# Patient Record
Sex: Male | Born: 1960 | Race: Black or African American | Hispanic: No | Marital: Single | Smoking: Current every day smoker
Health system: Southern US, Community
[De-identification: ages and names within clinical notes are randomized; demographics above are authoritative.]

## PROBLEM LIST (undated history)

## (undated) DIAGNOSIS — I5042 Chronic combined systolic (congestive) and diastolic (congestive) heart failure: Secondary | ICD-10-CM

## (undated) DIAGNOSIS — I472 Ventricular tachycardia: Secondary | ICD-10-CM

## (undated) DIAGNOSIS — I5041 Acute combined systolic (congestive) and diastolic (congestive) heart failure: Secondary | ICD-10-CM

## (undated) DIAGNOSIS — I2510 Atherosclerotic heart disease of native coronary artery without angina pectoris: Secondary | ICD-10-CM

## (undated) DIAGNOSIS — I4729 Other ventricular tachycardia: Secondary | ICD-10-CM

## (undated) DIAGNOSIS — I420 Dilated cardiomyopathy: Secondary | ICD-10-CM

## (undated) DIAGNOSIS — E785 Hyperlipidemia, unspecified: Secondary | ICD-10-CM

## (undated) DIAGNOSIS — J45909 Unspecified asthma, uncomplicated: Secondary | ICD-10-CM

## (undated) HISTORY — DX: Ventricular tachycardia: I47.2

## (undated) HISTORY — DX: Chronic combined systolic (congestive) and diastolic (congestive) heart failure: I50.42

## (undated) HISTORY — DX: Other ventricular tachycardia: I47.29

## (undated) HISTORY — DX: Hyperlipidemia, unspecified: E78.5

## (undated) HISTORY — DX: Acute combined systolic (congestive) and diastolic (congestive) heart failure: I50.41

## (undated) HISTORY — DX: Atherosclerotic heart disease of native coronary artery without angina pectoris: I25.10

## (undated) HISTORY — DX: Dilated cardiomyopathy: I42.0

---

## 2002-08-31 ENCOUNTER — Emergency Department (HOSPITAL_COMMUNITY): Admission: EM | Admit: 2002-08-31 | Discharge: 2002-08-31 | Payer: Self-pay | Admitting: Emergency Medicine

## 2004-04-10 ENCOUNTER — Emergency Department (HOSPITAL_COMMUNITY): Admission: EM | Admit: 2004-04-10 | Discharge: 2004-04-10 | Payer: Self-pay | Admitting: Family Medicine

## 2005-01-13 ENCOUNTER — Emergency Department (HOSPITAL_COMMUNITY): Admission: EM | Admit: 2005-01-13 | Discharge: 2005-01-13 | Payer: Self-pay | Admitting: *Deleted

## 2005-01-15 ENCOUNTER — Emergency Department (HOSPITAL_COMMUNITY): Admission: EM | Admit: 2005-01-15 | Discharge: 2005-01-15 | Payer: Self-pay | Admitting: Emergency Medicine

## 2005-01-22 ENCOUNTER — Ambulatory Visit: Payer: Self-pay | Admitting: Family Medicine

## 2005-07-16 ENCOUNTER — Emergency Department (HOSPITAL_COMMUNITY): Admission: EM | Admit: 2005-07-16 | Discharge: 2005-07-16 | Payer: Self-pay | Admitting: Emergency Medicine

## 2007-01-19 ENCOUNTER — Emergency Department (HOSPITAL_COMMUNITY): Admission: AC | Admit: 2007-01-19 | Discharge: 2007-01-19 | Payer: Self-pay

## 2011-07-23 ENCOUNTER — Emergency Department (HOSPITAL_COMMUNITY)
Admission: EM | Admit: 2011-07-23 | Discharge: 2011-07-23 | Disposition: A | Discharge status: Home / Self Care | Payer: No Typology Code available for payment source | Attending: Emergency Medicine | Admitting: Emergency Medicine

## 2011-07-23 ENCOUNTER — Emergency Department (HOSPITAL_COMMUNITY): Payer: No Typology Code available for payment source

## 2011-07-23 DIAGNOSIS — S139XXA Sprain of joints and ligaments of unspecified parts of neck, initial encounter: Secondary | ICD-10-CM | POA: Insufficient documentation

## 2011-07-23 DIAGNOSIS — Y92410 Unspecified street and highway as the place of occurrence of the external cause: Secondary | ICD-10-CM | POA: Insufficient documentation

## 2011-07-23 DIAGNOSIS — S335XXA Sprain of ligaments of lumbar spine, initial encounter: Secondary | ICD-10-CM | POA: Insufficient documentation

## 2011-07-23 DIAGNOSIS — M542 Cervicalgia: Secondary | ICD-10-CM | POA: Insufficient documentation

## 2011-07-23 DIAGNOSIS — S39012A Strain of muscle, fascia and tendon of lower back, initial encounter: Secondary | ICD-10-CM

## 2011-07-23 DIAGNOSIS — M549 Dorsalgia, unspecified: Secondary | ICD-10-CM | POA: Insufficient documentation

## 2011-07-23 DIAGNOSIS — R209 Unspecified disturbances of skin sensation: Secondary | ICD-10-CM | POA: Insufficient documentation

## 2011-07-23 DIAGNOSIS — S161XXA Strain of muscle, fascia and tendon at neck level, initial encounter: Secondary | ICD-10-CM

## 2011-07-23 MED ORDER — HYDROCODONE-ACETAMINOPHEN 5-325 MG PO TABS: 1.0000 | ORAL_TABLET | Freq: Four times a day (QID) | ORAL | Status: AC | PRN

## 2011-07-23 MED ORDER — CYCLOBENZAPRINE HCL 10 MG PO TABS: 10.0000 mg | ORAL_TABLET | Freq: Three times a day (TID) | ORAL | Status: AC | PRN

## 2011-07-23 MED ORDER — IBUPROFEN 800 MG PO TABS: 800.0000 mg | ORAL_TABLET | Freq: Three times a day (TID) | ORAL | Status: AC | PRN
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
## 2011-07-23 NOTE — ED Provider Notes (Signed)
Evaluation and management procedures were performed by the mid-level provider (PA/NP/CNM) under my supervision/collaboration. I was present and available during the ED course. Matthew Cina Y.   Adelaide Pfefferkorn Y. Jamielynn Wigley, MD 07/23/11 1607 
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
## 2012-01-16 ENCOUNTER — Encounter (HOSPITAL_COMMUNITY): Payer: Self-pay | Admitting: Emergency Medicine

## 2012-01-16 ENCOUNTER — Emergency Department (HOSPITAL_COMMUNITY): Payer: Self-pay

## 2012-01-16 ENCOUNTER — Emergency Department (HOSPITAL_COMMUNITY)
Admission: EM | Admit: 2012-01-16 | Discharge: 2012-01-17 | Disposition: A | Discharge status: Home / Self Care | Payer: Self-pay | Attending: Emergency Medicine | Admitting: Emergency Medicine

## 2012-01-16 DIAGNOSIS — R51 Headache: Secondary | ICD-10-CM | POA: Insufficient documentation

## 2012-01-16 DIAGNOSIS — H5710 Ocular pain, unspecified eye: Secondary | ICD-10-CM | POA: Insufficient documentation

## 2012-01-16 DIAGNOSIS — R209 Unspecified disturbances of skin sensation: Secondary | ICD-10-CM | POA: Insufficient documentation

## 2012-01-16 DIAGNOSIS — S0083XA Contusion of other part of head, initial encounter: Secondary | ICD-10-CM

## 2012-01-16 DIAGNOSIS — S022XXA Fracture of nasal bones, initial encounter for closed fracture: Secondary | ICD-10-CM | POA: Insufficient documentation

## 2012-01-16 DIAGNOSIS — S0003XA Contusion of scalp, initial encounter: Secondary | ICD-10-CM | POA: Insufficient documentation

## 2012-01-16 DIAGNOSIS — M546 Pain in thoracic spine: Secondary | ICD-10-CM | POA: Insufficient documentation

## 2012-01-16 DIAGNOSIS — S1093XA Contusion of unspecified part of neck, initial encounter: Secondary | ICD-10-CM | POA: Insufficient documentation
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
## 2012-01-17 MED ORDER — DIAZEPAM 5 MG PO TABS
5.0000 mg | ORAL_TABLET | Freq: Four times a day (QID) | ORAL | Status: AC | PRN
Start: 2012-01-17 — End: 2012-01-27

## 2012-01-17 MED ORDER — HYDROMORPHONE HCL PF 1 MG/ML IJ SOLN
1.0000 mg | Freq: Once | INTRAMUSCULAR | Status: AC
  Administered 2012-01-17: 1 mg via INTRAMUSCULAR
  Filled 2012-01-17: qty 1

## 2012-01-17 MED ORDER — OXYCODONE-ACETAMINOPHEN 5-325 MG PO TABS: 1.0000 | ORAL_TABLET | Freq: Four times a day (QID) | ORAL | Status: AC | PRN
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
## 2014-01-16 ENCOUNTER — Emergency Department (HOSPITAL_COMMUNITY): Payer: Self-pay

## 2014-01-16 ENCOUNTER — Encounter (HOSPITAL_COMMUNITY): Payer: Self-pay | Admitting: Emergency Medicine

## 2014-01-16 ENCOUNTER — Emergency Department (HOSPITAL_COMMUNITY)
Admission: EM | Admit: 2014-01-16 | Discharge: 2014-01-16 | Disposition: A | Discharge status: Home / Self Care | Payer: Self-pay | Attending: Emergency Medicine | Admitting: Emergency Medicine

## 2014-01-16 DIAGNOSIS — R209 Unspecified disturbances of skin sensation: Secondary | ICD-10-CM | POA: Insufficient documentation

## 2014-01-16 DIAGNOSIS — R42 Dizziness and giddiness: Secondary | ICD-10-CM | POA: Insufficient documentation

## 2014-01-16 DIAGNOSIS — I10 Essential (primary) hypertension: Secondary | ICD-10-CM | POA: Insufficient documentation

## 2014-01-16 DIAGNOSIS — Z880 Allergy status to penicillin: Secondary | ICD-10-CM | POA: Insufficient documentation

## 2014-01-16 DIAGNOSIS — F17200 Nicotine dependence, unspecified, uncomplicated: Secondary | ICD-10-CM | POA: Insufficient documentation

## 2014-01-16 LAB — CBC
HCT: 43.3 % (ref 39.0–52.0)
Hemoglobin: 14.2 g/dL (ref 13.0–17.0)
MCH: 27.8 pg (ref 26.0–34.0)
MCHC: 32.8 g/dL (ref 30.0–36.0)
MCV: 84.9 fL (ref 78.0–100.0)
Platelets: 387 10*3/uL (ref 150–400)
RBC: 5.1 MIL/uL (ref 4.22–5.81)
RDW: 13.2 % (ref 11.5–15.5)
WBC: 7.3 10*3/uL (ref 4.0–10.5)

## 2014-01-16 LAB — DIFFERENTIAL
Basophils Absolute: 0 10*3/uL (ref 0.0–0.1)
Basophils Relative: 0 % (ref 0–1)
Eosinophils Absolute: 0.1 10*3/uL (ref 0.0–0.7)
Eosinophils Relative: 2 % (ref 0–5)
Lymphocytes Relative: 46 % (ref 12–46)
Lymphs Abs: 3.3 10*3/uL (ref 0.7–4.0)
Monocytes Absolute: 0.5 10*3/uL (ref 0.1–1.0)
Monocytes Relative: 7 % (ref 3–12)
Neutro Abs: 3.3 10*3/uL (ref 1.7–7.7)
Neutrophils Relative %: 45 % (ref 43–77)

## 2014-01-16 LAB — COMPREHENSIVE METABOLIC PANEL
ALT: 25 U/L (ref 0–53)
AST: 23 U/L (ref 0–37)
Albumin: 4.3 g/dL (ref 3.5–5.2)
Alkaline Phosphatase: 88 U/L (ref 39–117)
BUN: 15 mg/dL (ref 6–23)
CO2: 21 mEq/L (ref 19–32)
Calcium: 9.5 mg/dL (ref 8.4–10.5)
Chloride: 103 mEq/L (ref 96–112)
Creatinine, Ser: 1.01 mg/dL (ref 0.50–1.35)
GFR calc Af Amer: >90 mL/min (ref >90)
GFR calc non Af Amer: 84 mL/min — ABNORMAL LOW (ref >90)
Glucose, Bld: 118 mg/dL — ABNORMAL HIGH (ref 70–99)
Potassium: 4.3 mEq/L (ref 3.7–5.3)
Sodium: 138 mEq/L (ref 137–147)
Total Bilirubin: 0.3 mg/dL (ref 0.3–1.2)
Total Protein: 7.4 g/dL (ref 6.0–8.3)

## 2014-01-16 LAB — PROTIME-INR
INR: 0.88 (ref 0.00–1.49)
Prothrombin Time: 11.8 seconds (ref 11.6–15.2)

## 2014-01-16 MED ORDER — MECLIZINE HCL 50 MG PO TABS: 25.0000 mg | ORAL_TABLET | Freq: Three times a day (TID) | ORAL | Status: DC | PRN

## 2014-01-16 MED ORDER — HYDROCHLOROTHIAZIDE 25 MG PO TABS: 25.0000 mg | ORAL_TABLET | Freq: Every day | ORAL | Status: DC

## 2014-01-16 MED ORDER — MECLIZINE HCL 25 MG PO TABS
25.0000 mg | ORAL_TABLET | ORAL | Status: AC
  Administered 2014-01-16: 25 mg via ORAL
  Filled 2014-01-16: qty 1

## 2014-01-16 NOTE — ED Notes (Signed)
Patient that he is experiencing dizziness when is lying and then getting up. Patient also c/o tenderness of the left posterior neck and numbness and tingling down the left arm and left hand. Patient stated the symptoms started this AM. MAE.

## 2014-01-16 NOTE — Discharge Instructions (Signed)
Hypertension As your heart beats, it forces blood through your arteries. This force is your blood pressure. If the pressure is too high, it is called hypertension (HTN) or high blood pressure. HTN is dangerous because you may have it and not know it. High blood pressure may mean that your heart has to work harder to pump blood. Your arteries may be narrow or stiff. The extra work puts you at risk for heart disease, stroke, and other problems.  Blood pressure consists of two numbers, a higher number over a lower, 110/72, for example. It is stated as "110 over 72." The ideal is below 120 for the top number (systolic) and under 80 for the bottom (diastolic). Write down your blood pressure today. You should pay close attention to your blood pressure if you have certain conditions such as:  Heart failure.  Prior heart attack.  Diabetes  Chronic kidney disease.  Prior stroke.  Multiple risk factors for heart disease. To see if you have HTN, your blood pressure should be measured while you are seated with your arm held at the level of the heart. It should be measured at least twice. A one-time elevated blood pressure reading (especially in the Emergency Department) does not mean that you need treatment. There may be conditions in which the blood pressure is different between your right and left arms. It is important to see your caregiver soon for a recheck. Most people have essential hypertension which means that there is not a specific cause. This type of high blood pressure may be lowered by changing lifestyle factors such as:  Stress.  Smoking.  Lack of exercise.  Excessive weight.  Drug/tobacco/alcohol use.  Eating less salt. Most people do not have symptoms from high blood pressure until it has caused damage to the body. Effective treatment can often prevent, delay or reduce that damage. TREATMENT  When a cause has been identified, treatment for high blood pressure is directed at the  cause. There are a large number of medications to treat HTN. These fall into several categories, and your caregiver will help you select the medicines that are best for you. Medications may have side effects. You should review side effects with your caregiver. If your blood pressure stays high after you have made lifestyle changes or started on medicines,   Your medication(s) may need to be changed.  Other problems may need to be addressed.  Be certain you understand your prescriptions, and know how and when to take your medicine.  Be sure to follow up with your caregiver within the time frame advised (usually within two weeks) to have your blood pressure rechecked and to review your medications.  If you are taking more than one medicine to lower your blood pressure, make sure you know how and at what times they should be taken. Taking two medicines at the same time can result in blood pressure that is too low. SEEK IMMEDIATE MEDICAL CARE IF:  You develop a severe headache, blurred or changing vision, or confusion.  You have unusual weakness or numbness, or a faint feeling.  You have severe chest or abdominal pain, vomiting, or breathing problems. MAKE SURE YOU:   Understand these instructions.  Will watch your condition.  Will get help right away if you are not doing well or get worse. Document Released: 08/11/2005 Document Revised: 11/03/2011 Document Reviewed: 03/31/2008 [facility] Patient Information 2014 [place].  Vertigo Vertigo means you feel like you or your surroundings are moving when they are  not. Vertigo can be dangerous if it occurs when you are at work, driving, or performing difficult activities.  CAUSES  Vertigo occurs when there is a conflict of signals sent to your brain from the visual and sensory systems in your body. There are many different causes of vertigo, including:  Infections, especially in the inner ear.  A bad reaction to a drug or misuse of  alcohol and medicines.  Withdrawal from drugs or alcohol.  Rapidly changing positions, such as lying down or rolling over in bed.  A migraine headache.  Decreased blood flow to the brain.  Increased pressure in the brain from a head injury, infection, tumor, or bleeding. SYMPTOMS  You may feel as though the world is spinning around or you are falling to the ground. Because your balance is upset, vertigo can cause nausea and vomiting. You may have involuntary eye movements (nystagmus). DIAGNOSIS  Vertigo is usually diagnosed by physical exam. If the cause of your vertigo is unknown, your caregiver may perform imaging tests, such as an MRI scan (magnetic resonance imaging). TREATMENT  Most cases of vertigo resolve on their own, without treatment. Depending on the cause, your caregiver may prescribe certain medicines. If your vertigo is related to body position issues, your caregiver may recommend movements or procedures to correct the problem. In rare cases, if your vertigo is caused by certain inner ear problems, you may need surgery. HOME CARE INSTRUCTIONS   Follow your caregiver's instructions.  Avoid driving.  Avoid operating heavy machinery.  Avoid performing any tasks that would be dangerous to you or others during a vertigo episode.  Tell your caregiver if you notice that certain medicines seem to be causing your vertigo. Some of the medicines used to treat vertigo episodes can actually make them worse in some people. SEEK IMMEDIATE MEDICAL CARE IF:   Your medicines do not relieve your vertigo or are making it worse.  You develop problems with talking, walking, weakness, or using your arms, hands, or legs.  You develop severe headaches.  Your nausea or vomiting continues or gets worse.  You develop visual changes.  A family member notices behavioral changes.  Your condition gets worse. MAKE SURE YOU:  Understand these instructions.  Will watch your  condition.  Will get help right away if you are not doing well or get worse. Document Released: 05/21/2005 Document Revised: 11/03/2011 Document Reviewed: 02/27/2011 [facility] Patient Information 2014 [place], [place].   Emergency Department Resource Guide 1) Find a Doctor and Pay Out of Pocket Although you won't have to find out who is covered by your insurance plan, it is a good idea to ask around and get recommendations. You will then need to call the office and see if the doctor you have chosen will accept you as a new patient and what types of options they offer for patients who are self-pay. Some doctors offer discounts or will set up payment plans for their patients who do not have insurance, but you will need to ask so you aren't surprised when you get to your appointment.  2) Contact Your Local Health Department Not all health departments have doctors that can see patients for sick visits, but many do, so it is worth a call to see if yours does. If you don't know where your local health department is, you can check in your phone book. The CDC also has a tool to help you locate your state's health department, and many state websites also have listings of  all of their local health departments.  3) Find a Walk-in Clinic If your illness is not likely to be very severe or complicated, you may want to try a walk in clinic. These are popping up all over the country in pharmacies, drugstores, and shopping centers. They're usually staffed by nurse practitioners or physician assistants that have been trained to treat common illnesses and complaints. They're usually fairly quick and inexpensive. However, if you have serious medical issues or chronic medical problems, these are probably not your best option.  No Primary Care Doctor: - Call Health Connect at  [phone] - they can help you locate a primary care doctor that  accepts your insurance, provides certain services, etc. - Physician Referral  Service- [phone]  Chronic Pain Problems: Organization         Address  Phone   Notes  [facility]  [phone] Patients need to be referred by their primary care doctor.   Medication Assistance: Organization         Address  Phone   Notes  [facility] [address] [place], [place] [address] [phone] --Must be a resident of [place] [facility] -- Must have NO insurance coverage whatsoever (no Medicaid/ Medicare, etc.) -- The pt. MUST have a primary care doctor that directs their care regularly and follows them in the community   [facility]ist  [phone]   [employer]  [phone]    Agencies that provide inexpensive medical care: Organization         Address  Phone   Notes  [facility]  [phone]   [facility]    [phone]   [facility] [address] [place], [place] [address] [phone]   [facility] [address], [place] [phone]   Planned Parenthood    [phone]   [facility]    [phone]   [facility]  [address], [place] Phone:  [phone]) [phone], Fax:  [phone] Hours of Operation:  9 am - 6 pm, M-F.  Also accepts Medicaid/Medicare and self-pay.  [facility]  [address], [place] Phone: [phone], Fax: [phone]. Hours of Operation:  8:30 am - 5:30 pm, M-F.  Also accepts Medicaid and self-pay.  [facility] [place] [address], [place] Phone: [phone]) [phone]   [facility] [address], [place], [place] [phone], Ext. [phone] Mondays & Thursdays: 7-9 AM.  First 15 patients are seen on a first come, first serve basis.    Medicaid-accepting [facility] [place] Providers:  Organization         Address  Phone   Notes  [facility] [address], [place] [phone] Also accepts self-pay patients.  [facility] [address] [place], [place]  [phone]) [phone]   [facility] [address], [place] [phone]) [phone]   [facility] [address] [place] [address], [place] [phone]) [phone]   Renaye RakersVeita Bland [address], [place]   [phone] Only accepts [place] Access [place] patients after they have their name applied to their card.   Self-Pay (no insurance) in [facility]:  Retail buyerrganization         Address  Phone   Notes  Sickle Cell Patients, [facility] Internal Medicine [address] [place], [place] [phone]   [facility] [address] [place], [place] [phone]   [facility] [place]  [address], [place] [phone]   [facility]/Dr. Osei-Bonsu  [address] [place] or [address], [place] [phone] Phone number for both [place] and [place] locations is the same.  [facility] [address] [place] [phone]   [facility] [address] [place] or [address] [phone]   [facility] [address] [place] [phone], phone; [phone], fax Sees patients 1st and 3rd Saturday of every month.  Must not qualify for public or private insurance (i.e. Medicaid, Medicare, [place] Health Choice, Veterans' Benefits)  Household income should be no more than 200% of the poverty level The clinic cannot treat you if you are pregnant or think you are pregnant  Sexually transmitted diseases are not treated at the clinic.   Dental Care: Organization         Address  Phone  Notes  [facility] Department of [facility] [address] [place], [place] [phone] Accepts children up to age 32 who are enrolled in  [place] or [place] Health Choice; pregnant women with a Medicaid card; and children who have applied for Medicaid or [place] Health Choice, but were declined, whose parents can pay a reduced fee at time of service.  [facility] Department of [facility]  [address], [place] [phone] Accepts children up to age 57 who are enrolled in [place] or [place] Health Choice; pregnant women with a Medicaid card; and children who have applied for Medicaid or [place] Health Choice, but were declined, whose parents can pay a reduced fee at time of service.  [facility]  [address] [place], [place] [phone] Patients are seen by appointment only. Walk-ins are not accepted. [facility] will see patients 44 years of age and older. Monday - Tuesday (8am-5pm) Most Wednesdays (8:30-5pm) $30 per visit, cash only  [facility] Access PRO[facility]  [address], [facility] [phone] Patients are seen by appointment only. Walk-ins are not accepted. [facility] will see patients 66 years of age and older. One Wednesday Evening (Monthly: Volunteer Based).  $30 per visit, cash only  [employer]  [phone] for adults; Children under age 47, call Graduate Pediatric Dentistry at [phone]. Children aged 81-14, please call [phone] to request a pediatric application.  Dental services are provided in all areas of dental care including fillings, crowns and bridges, complete and partial dentures, implants, gum treatment, root canals, and extractions. Preventive care is also provided. Treatment is provided to both adults and children. Patients are selected via a lottery and there is often a waiting list.   [facility] [address], [place]  [phone] [email or web address]   [facility] [address] [place], [place] [phone], Ext. [phone] Second and Fourth Thursday of each month, opens at 6:30  AM; Clinic ends at 9 AM.  Patients are seen on a first-come first-served basis, and a limited number are seen during each clinic.   [facility]  [address] [place], [place] [phone]   Eligibility Requirements  You must have lived in [place], [place], or [place] counties for at least the last three months.   You cannot be eligible for state or federal sponsored [place], including [employer], [place], or [employer].   You generally cannot be eligible for healthcare insurance through your employer.    How to apply: Eligibility screenings are held every Tuesday and Wednesday afternoon from 1:00 pm until 4:00 pm. You do not need an appointment for the interview!  [facility] [address] [place], [place] [phone]   [facility] Health Department  [phone]   [facility] Health Department  [phone]   [facility] Health Department  [phone]    Behavioral Health Resources in the Community: Intensive Outpatient Programs Organization         Address  Phone  Notes  [facility] Services [address] [place], [place] [phone]   [facility] Outpatient [address], [place], [place] [phone]   [facility] [address], [place], [place]  [phone]   [facility] Mental Health [address]  [place], [place] [phone] or [phone]   Substance Abuse Resources Organization         Address  Phone  Notes  Alcohol [facility] Drug Services  [phone]   [facility]  [phone]   [facility] [place]  [phone]   Floydene Flock  [phone]   Residential & Outpatient Substance Abuse Program  [phone]   Psychological Services Organization         Address  Phone  Notes  [facility] Health  [phone]   [facility] Services  [phone]   [facility] [address] [place] [phone] or  [phone]    Mobile Crisis Teams Organization         Address  Phone  Notes  [facility], Mobile Crisis Care Unit  [phone]   [facility]  [address] [place], [place] [phone]   Doristine Locks [address] [place] [address] [place] [phone]    Self-Help/Support Groups Organization         Address  Phone             Notes  Mental Health Assoc. of  - variety of support groups  [phone]- I7437963 Call for more information  Narcotics Anonymous (NA), [facility] [address], [employer] [place]  2 meetings at this location   Statistician         Address  Phone  Notes  [facility] [address],    [place]  [phone]   [employer]  [address], [place] [address], [place], [place] [phone]   [facility] [address] [place], [place] [phone] Admissions: 8am-3pm M-F  [facility] [address],    [place], [place] [phone]   [facility] [address] [place], [place], [place] [phone]   [facility] [address]  [place], [place] [phone]   [facility] - Intensive Outpatient [address], [place], [place] [phone]   [facility] (Addiction Recovery [facility] [address]  [place], [place] [phone] or [phone]   [facility] (RTS) [address] [place], [place] [phone] Accepts Medicaid  [facility] [address],  [place] [phone] Substance Abuse/Addiction Treatment   [facility] Resources Organization         Address  Phone  Notes  [facility]  [phone]   Angie Fava, PhD [address] Ervin Knack [place], [place]   [phone] or [phone]   [facility] Behavioral   [address] [place], [place] [phone]   [facility] Recovery [address]  [place], [place] [phone] Insurance/Medicaid/sponsorship through [facility] [address]                                    [place], [place] [phone] Therapy/tele-psych/case  [facility] [address], [place] [phone]    Dr. Lolly Mustache  [phone]   [facility] [place] [place]  [employer] [facility] Dept. 1) [address], [place] 2) [address] [place] 3)  [address], [place] [phone]  [phone]   [facility] Child Abuse Hotline ([phone])374-0085 or [phone] (After Hours)

## 2014-01-16 NOTE — ED Provider Notes (Signed)
CSN: [id number]     Arrival date & time 01/16/14  4098 History   First MD Initiated Contact with Patient 01/16/14 [phone]     Chief Complaint  Patient presents with  . Dizziness    HPI Patient presents to the emergency room with complaints of dizziness. Patient states he noticed the symptoms last night. When he was lying down he felt lightheaded and then when he stood up he felt dizzy. His eyes felt weird but he was not specifically having trouble with double vision or blurred vision. He felt like his eyes were spinning around in his head. He also little discomfort in the back of his neck. He also has noticed a sensation of numbness in his left arm. His left arm still feels a little funny but the symptoms have mostly resolved. He talked to a friend who said he could be having a stroke he came to the emergency room to be checked out.  Denies trouble with slurred speech. He has not any trouble with his coordination. He does not feel weak. He has not been dropping any objects. History reviewed. No pertinent past medical history. History reviewed. No pertinent past surgical history. No family history on file. History  Substance Use Topics  . Smoking status: Current Every Day Smoker -- 0.50 packs/day  . Smokeless tobacco: Not on file  . Alcohol Use: No    Review of Systems  All other systems reviewed and are negative.     Allergies  Penicillins  Home Medications   Prior to Admission medications   Not on File   BP 155/96  Pulse 58  Temp(Src) 97.7 F (36.5 C) (Oral)  Resp 13  SpO2 97% Physical Exam  Nursing note and vitals reviewed. Constitutional: He is oriented to person, place, and time. He appears well-developed and well-nourished. No distress.  HENT:  Head: Normocephalic and atraumatic.  Right Ear: External ear normal.  Left Ear: External ear normal.  Mouth/Throat: Oropharynx is clear and moist.  Eyes: Conjunctivae are normal. Right eye exhibits no discharge. Left eye  exhibits no discharge. No scleral icterus.  Neck: Neck supple. No tracheal deviation present.  Cardiovascular: Normal rate, regular rhythm and intact distal pulses.   Pulmonary/Chest: Effort normal and breath sounds normal. No stridor. No respiratory distress. He has no wheezes. He has no rales.  Abdominal: Soft. Bowel sounds are normal. He exhibits no distension. There is no tenderness. There is no rebound and no guarding.  Musculoskeletal: He exhibits no edema and no tenderness.  Neurological: He is alert and oriented to person, place, and time. He has normal strength. No cranial nerve deficit (No facial droop, extraocular movements intact, tongue midline ) or sensory deficit. He exhibits normal muscle tone. He displays no seizure activity. Coordination normal.  No pronator drift bilateral upper extrem, able to hold both legs off bed for 5 seconds, sensation intact in all extremities, no visual field cuts, no left or right sided neglect, difficulty with finger to nose exam bilaterally requiring a lot of effort to touch the tip of my finger (requires more time to perform the test), no nystagmus noted   Skin: Skin is warm and dry. No rash noted.  Psychiatric: He has a normal mood and affect.    ED Course  Procedures (including critical care time) Labs Review Labs Reviewed  COMPREHENSIVE METABOLIC PANEL - Abnormal; Notable for the following:    Glucose, Bld 118 (*)    GFR calc non Af Amer 84 (*)  All other components within normal limits  PROTIME-INR  CBC  DIFFERENTIAL    Imaging Review Mr Brain Wo Contrast  01/16/2014   CLINICAL DATA:  Dizziness. Numbness and tingling in the left arm. Visual symptoms. Headache.  EXAM: MRI HEAD WITHOUT CONTRAST  TECHNIQUE: Multiplanar, multiecho pulse sequences of the brain and surrounding structures were obtained without intravenous contrast.  COMPARISON:  CT head 01/16/2012.  FINDINGS: The patient was unable to remain motionless for the exam. Small or  subtle lesions could be overlooked.  No evidence for acute infarction, hemorrhage, mass lesion, hydrocephalus, or extra-axial fluid. Normal cerebral volume. Mild increased signal in the subcortical and periventricular white matter, likely chronic microvascular ischemic change. No large vessel or lacunar infarct.  Flow voids are maintained throughout the carotid, basilar, and vertebral arteries. There are no areas of chronic hemorrhage. Pituitary, pineal, and cerebellar tonsils unremarkable. Visualized calvarium, skull base, and upper cervical osseous structures unremarkable. Scalp and extracranial soft tissues, orbits, sinuses, and mastoids show no acute process.  Compared with prior head CT, the appearance is similar.  IMPRESSION: Mild small vessel disease.  No acute intracranial abnormalities.   Electronically Signed   By: Davonna BellingJohn  Curnes M.D.   On: 01/16/2014 13:27     EKG Interpretation   Date/Time:  Monday Jan 16 2014 09:41:10 EDT Ventricular Rate:  76 PR Interval:  158 QRS Duration: 73 QT Interval:  384 QTC Calculation: 432 R Axis:   36 Text Interpretation:  Sinus rhythm Consider left atrial enlargement  Abnormal R-wave progression, early transition Consider left ventricular  hypertrophy Nonspecific T abnormalities, inferior leads No previous  tracing Confirmed by Aikam Hellickson  MD-J, Nathaniel Yaden ([id number]) on 01/16/2014 9:52:26 AM      MDM   Final diagnoses:  Dizziness  HTN (hypertension)    No stroke noted on the MRI.  Pt symptoms are somewhat vague.  ?peripheral vertigo, ?nerual impingement with the arm symptoms.  Discussed findings.  Will dc home on hctz and meclizine.  Discussed importance of PCP follow up    Linwood DibblesJon Deryk Bozman, MD 01/16/14 1418

## 2014-01-16 NOTE — ED Notes (Signed)
Patient transported to MRI 

## 2014-01-16 NOTE — ED Notes (Signed)
Pt c/o dizziness that started last night, states when he was laying down he couldn't sleep and when standing up his eyes felt weird. C/o pain in the back of neck,

## 2018-03-21 ENCOUNTER — Other Ambulatory Visit: Payer: Self-pay

## 2018-03-21 ENCOUNTER — Encounter (HOSPITAL_COMMUNITY): Payer: Self-pay

## 2018-03-21 ENCOUNTER — Emergency Department (HOSPITAL_COMMUNITY)
Admission: EM | Admit: 2018-03-21 | Discharge: 2018-03-21 | Disposition: A | Discharge status: Home / Self Care | Payer: Self-pay | Attending: Emergency Medicine | Admitting: Emergency Medicine

## 2018-03-21 DIAGNOSIS — F17210 Nicotine dependence, cigarettes, uncomplicated: Secondary | ICD-10-CM | POA: Insufficient documentation

## 2018-03-21 DIAGNOSIS — K047 Periapical abscess without sinus: Secondary | ICD-10-CM | POA: Insufficient documentation

## 2018-03-21 MED ORDER — NAPROXEN 500 MG PO TABS: 500.0000 mg | ORAL_TABLET | Freq: Two times a day (BID) | ORAL | 0 refills | Status: DC

## 2018-03-21 MED ORDER — IBUPROFEN 200 MG PO TABS
600.0000 mg | ORAL_TABLET | Freq: Once | ORAL | Status: AC
  Administered 2018-03-21: 600 mg via ORAL
  Filled 2018-03-21: qty 3

## 2018-03-21 MED ORDER — CLINDAMYCIN HCL 300 MG PO CAPS
300.0000 mg | ORAL_CAPSULE | Freq: Once | ORAL | Status: AC
  Administered 2018-03-21: 300 mg via ORAL
  Filled 2018-03-21: qty 1

## 2018-03-21 MED ORDER — CLINDAMYCIN HCL 300 MG PO CAPS: 300.0000 mg | ORAL_CAPSULE | Freq: Three times a day (TID) | ORAL | 0 refills | Status: DC

## 2018-03-21 NOTE — ED Triage Notes (Signed)
He c/o right-sided mouth discomfort. He has a missing tooth at right lower (second from very back), where the pain is centered.

## 2018-03-21 NOTE — Discharge Instructions (Addendum)
We are treating you for infection. Follow up with a dentist as soon as possible.

## 2018-03-21 NOTE — ED Provider Notes (Signed)
[facility]-EMERGENCY DEPT Provider Note   CSN: [id number] Arrival date & time: 03/21/18  1431     History   Chief Complaint Chief Complaint  Patient presents with  . Dental Pain    HPI Daryl [name] is a 57 y.o. male who presents to the ED for dental pain. The pain started 2 days ago in the right lower dental area. Patient reports he has been using a tea bag on the area without relief.   HPI  History reviewed. No pertinent past medical history.  There are no active problems to display for this patient.   No past surgical history on file.      Home Medications    Prior to Admission medications   Medication Sig Start Date End Date Taking? Authorizing Provider  clindamycin (CLEOCIN) 300 MG capsule Take 1 capsule (300 mg total) by mouth 3 (three) times daily. 03/21/18   Janne Napoleon, NP  hydrochlorothiazide (HYDRODIURIL) 25 MG tablet Take 1 tablet (25 mg total) by mouth daily. 01/16/14   Linwood Dibbles, MD  meclizine (ANTIVERT) 50 MG tablet Take 0.5 tablets (25 mg total) by mouth 3 (three) times daily as needed for dizziness or nausea. 01/16/14   Linwood Dibbles, MD  naproxen (NAPROSYN) 500 MG tablet Take 1 tablet (500 mg total) by mouth 2 (two) times daily. 03/21/18   Janne Napoleon, NP    Family History No family history on file.  Social History Social History   Tobacco Use  . Smoking status: Current Every Day Smoker    Packs/day: 0.50  . Smokeless tobacco: Never Used  Substance Use Topics  . Alcohol use: No  . Drug use: Not on file     Allergies   Penicillins   Review of Systems Review of Systems  HENT: Positive for dental problem.   All other systems reviewed and are negative.    Physical Exam Updated Vital Signs BP (!) 145/84 (BP Location: Right Arm)   Pulse 80   Temp 98.2 F (36.8 C) (Oral)   Resp 18   SpO2 100%   Physical Exam  Constitutional: He appears well-developed and well-nourished. No distress.  HENT:  Head:  Normocephalic.  Right Ear: Tympanic membrane normal.  Left Ear: Tympanic membrane normal.  Nose: Nose normal.  Mouth/Throat: Uvula is midline and oropharynx is clear and moist.    Right first molar missing. There is erythema and swelling of the gum surrounding that area as well as around the second molar.   Eyes: EOM are normal.  Neck: Neck supple.  Cardiovascular: Normal rate.  Pulmonary/Chest: Effort normal.  Musculoskeletal: Normal range of motion.  Neurological: He is alert.  Skin: Skin is warm and dry.  Psychiatric: He has a normal mood and affect.  Nursing note and vitals reviewed.    ED Treatments / Results  Labs (all labs ordered are listed, but only abnormal results are displayed) Labs Reviewed - No data to display  Radiology No results found.  Procedures Procedures (including critical care time)  Medications Ordered in ED Medications - No data to display   Initial Impression / Assessment and Plan / ED Course  I have reviewed the triage vital signs and the nursing notes. Patient with toothache.  No gross abscess.  Exam unconcerning for Ludwig's angina or spread of infection.  Will treat with Clindamycin since patient allergic to penicillin and with anti-inflammatories medicine.  Urged patient to follow-up with dentist. Resource referral given.   Final Clinical Impressions(s) /  ED Diagnoses   Final diagnoses:  Dental infection    ED Discharge Orders        Ordered    clindamycin (CLEOCIN) 300 MG capsule  3 times daily     03/21/18 1525    naproxen (NAPROSYN) 500 MG tablet  2 times daily     03/21/18 1525       Damian Leavell [place], [place] 03/21/18 1530    Sabas Sous, MD 03/21/18 [phone]

## 2018-03-21 NOTE — ED Notes (Signed)
Pt requesting initial doses of medication before leaving, NP Hope made aware

## 2018-04-18 ENCOUNTER — Emergency Department (HOSPITAL_COMMUNITY)
Admission: EM | Admit: 2018-04-18 | Discharge: 2018-04-18 | Disposition: A | Discharge status: Home / Self Care | Payer: Self-pay | Attending: Emergency Medicine | Admitting: Emergency Medicine

## 2018-04-18 ENCOUNTER — Encounter (HOSPITAL_COMMUNITY): Payer: Self-pay

## 2018-04-18 ENCOUNTER — Emergency Department (HOSPITAL_COMMUNITY): Payer: Self-pay

## 2018-04-18 ENCOUNTER — Other Ambulatory Visit: Payer: Self-pay

## 2018-04-18 DIAGNOSIS — J45909 Unspecified asthma, uncomplicated: Secondary | ICD-10-CM | POA: Insufficient documentation

## 2018-04-18 DIAGNOSIS — R220 Localized swelling, mass and lump, head: Secondary | ICD-10-CM

## 2018-04-18 DIAGNOSIS — R600 Localized edema: Secondary | ICD-10-CM | POA: Insufficient documentation

## 2018-04-18 DIAGNOSIS — F17200 Nicotine dependence, unspecified, uncomplicated: Secondary | ICD-10-CM | POA: Insufficient documentation

## 2018-04-18 DIAGNOSIS — Z79899 Other long term (current) drug therapy: Secondary | ICD-10-CM | POA: Insufficient documentation

## 2018-04-18 HISTORY — DX: Unspecified asthma, uncomplicated: J45.909

## 2018-04-18 LAB — COMPREHENSIVE METABOLIC PANEL
ALK PHOS: 92 U/L (ref 38–126)
ALT: 17 U/L (ref 0–44)
AST: 21 U/L (ref 15–41)
Albumin: 4.2 g/dL (ref 3.5–5.0)
Anion gap: 10 (ref 5–15)
BUN: 6 mg/dL (ref 6–20)
CALCIUM: 9.7 mg/dL (ref 8.9–10.3)
CO2: 28 mmol/L (ref 22–32)
CREATININE: 1.34 mg/dL — AB (ref 0.61–1.24)
Chloride: 100 mmol/L (ref 98–111)
GFR calc Af Amer: >60 mL/min (ref >60)
GFR calc non Af Amer: 58 mL/min — ABNORMAL LOW (ref >60)
GLUCOSE: 123 mg/dL — AB (ref 70–99)
Potassium: 4 mmol/L (ref 3.5–5.1)
Sodium: 138 mmol/L (ref 135–145)
Total Bilirubin: 0.9 mg/dL (ref 0.3–1.2)
Total Protein: 7.6 g/dL (ref 6.5–8.1)

## 2018-04-18 LAB — CBC WITH DIFFERENTIAL/PLATELET
Abs Immature Granulocytes: 0 10*3/uL (ref 0.0–0.1)
Basophils Absolute: 0 10*3/uL (ref 0.0–0.1)
Basophils Relative: 0 %
Eosinophils Absolute: 0.1 10*3/uL (ref 0.0–0.7)
Eosinophils Relative: 1 %
HCT: 51.9 % (ref 39.0–52.0)
Hemoglobin: 16.2 g/dL (ref 13.0–17.0)
Immature Granulocytes: 0 %
Lymphocytes Relative: 27 %
Lymphs Abs: 2.6 10*3/uL (ref 0.7–4.0)
MCH: 27.7 pg (ref 26.0–34.0)
MCHC: 31.2 g/dL (ref 30.0–36.0)
MCV: 88.9 fL (ref 78.0–100.0)
Monocytes Absolute: 0.9 10*3/uL (ref 0.1–1.0)
Monocytes Relative: 9 %
Neutro Abs: 6 10*3/uL (ref 1.7–7.7)
Neutrophils Relative %: 63 %
Platelets: 387 10*3/uL (ref 150–400)
RBC: 5.84 MIL/uL — ABNORMAL HIGH (ref 4.22–5.81)
RDW: 12.5 % (ref 11.5–15.5)
WBC: 9.6 10*3/uL (ref 4.0–10.5)

## 2018-04-18 MED ORDER — DEXAMETHASONE SODIUM PHOSPHATE 10 MG/ML IJ SOLN: 10.0000 mg | Freq: Once | INTRAMUSCULAR | Status: DC

## 2018-04-18 MED ORDER — DEXAMETHASONE SODIUM PHOSPHATE 10 MG/ML IJ SOLN
10.0000 mg | Freq: Once | INTRAMUSCULAR | Status: AC
  Administered 2018-04-18: 10 mg via INTRAVENOUS
  Filled 2018-04-18: qty 1

## 2018-04-18 MED ORDER — DOXYCYCLINE HYCLATE 100 MG PO CAPS: 100.0000 mg | ORAL_CAPSULE | Freq: Two times a day (BID) | ORAL | 0 refills | Status: AC

## 2018-04-18 MED ORDER — IOHEXOL 300 MG/ML  SOLN
75.0000 mL | Freq: Once | INTRAMUSCULAR | Status: AC | PRN
  Administered 2018-04-18: 75 mL via INTRAVENOUS

## 2018-04-18 NOTE — ED Triage Notes (Signed)
Pt seen in ED 03-21-18 for dental pain, was given Clindamycin.  Pt states 3-4 days of taking Clindamycin pt started having shortness of breath, pt cut down on the pills to one a day for several days, then opened capsule and poured in mouth, then stopped taking med.  Over the past 3-4 days right sided jaw pain and swelling to jaw, tongue, and throat.  Unable to eat solid foods, is able to drink fluids.

## 2018-04-18 NOTE — ED Notes (Signed)
Pt states that he would rather sit in chair beside bed rather than sit in the bed.   Refuses offered blanket

## 2018-04-18 NOTE — ED Provider Notes (Signed)
[facility] EMERGENCY DEPARTMENT Provider Note   CSN: [id number] Arrival date & time: 04/18/18  1308     History   Chief Complaint Chief Complaint  Patient presents with  . Facial Swelling    HPI Daryl [name] is a 57 y.o. male.  57 y.o male with a PMH of HTN non-compliant with medication presents to the ED with a chief complaint of facial swelling x 2 days. Patient states his first seen in the ED on 7/28 diagnosed with a tooth infection, he was prescribed clindamycin but he reports the swelling has gotten worse.  He reports he only completed 3 days of antibiotics as the symptoms worsen.  Patient reports difficulty swallowing solids, but is able to tolerate liquids.He states the pain is worse with swallowing  and laying down. He denies any fever, shortness of breath, changes in voice, or other complaints.      Past Medical History:  Diagnosis Date  . Asthma     There are no active problems to display for this patient.   History reviewed. No pertinent surgical history.      Home Medications    Prior to Admission medications   Medication Sig Start Date End Date Taking? Authorizing Provider  clindamycin (CLEOCIN) 300 MG capsule Take 1 capsule (300 mg total) by mouth 3 (three) times daily. 03/21/18   Janne Napoleon, NP  doxycycline (VIBRAMYCIN) 100 MG capsule Take 1 capsule (100 mg total) by mouth 2 (two) times daily for 7 days. 04/18/18 04/25/18  Claude Manges, PA-C  hydrochlorothiazide (HYDRODIURIL) 25 MG tablet Take 1 tablet (25 mg total) by mouth daily. 01/16/14   Linwood Dibbles, MD  meclizine (ANTIVERT) 50 MG tablet Take 0.5 tablets (25 mg total) by mouth 3 (three) times daily as needed for dizziness or nausea. 01/16/14   Linwood Dibbles, MD  naproxen (NAPROSYN) 500 MG tablet Take 1 tablet (500 mg total) by mouth 2 (two) times daily. 03/21/18   Janne Napoleon, NP    Family History History reviewed. No pertinent family history.  Social History Social History    Tobacco Use  . Smoking status: Current Every Day Smoker    Packs/day: 0.50  . Smokeless tobacco: Never Used  Substance Use Topics  . Alcohol use: Yes  . Drug use: Not on file     Allergies   Penicillins   Review of Systems Review of Systems  Constitutional: Negative for fever.  HENT: Positive for facial swelling and trouble swallowing. Negative for drooling, ear pain, rhinorrhea, sinus pressure, sinus pain, sore throat and voice change.   Eyes: Negative for pain.  Respiratory: Negative for shortness of breath.   Cardiovascular: Negative for chest pain.  All other systems reviewed and are negative.    Physical Exam Updated Vital Signs BP (!) 137/99 (BP Location: Right Arm)   Pulse 78   Temp 98.1 F (36.7 C) (Oral)   Resp 18   SpO2 97%   Physical Exam  Constitutional: He is oriented to person, place, and time. He appears well-developed and well-nourished.  HENT:  Head: Normocephalic and atraumatic.  Right Ear: Hearing normal.  Left Ear: Hearing normal.  Nose: Nose normal. Right sinus exhibits no maxillary sinus tenderness and no frontal sinus tenderness. Left sinus exhibits no maxillary sinus tenderness and no frontal sinus tenderness.  Mouth/Throat: Oropharynx is clear and moist. There is trismus in the jaw. Abnormal dentition. Dental abscesses present.  Unable to visualize oropharyngeal region as patient exhibits trismus on exam  and is unable to open mouth. Dental Abscess visualized on right back side.   Eyes: Pupils are equal, round, and reactive to light. No scleral icterus.  Neck: Normal range of motion and phonation normal. Muscular tenderness present. No neck rigidity. No tracheal deviation present.    Pain upon palpation of right side of his neck, patient continues to call back during exam and jump as he states the swelling feels like "throat closing"  Cardiovascular: Normal heart sounds.  Pulmonary/Chest: Effort normal and breath sounds normal. He has no  wheezes. He exhibits no tenderness.  Abdominal: Soft. Bowel sounds are normal. He exhibits no distension. There is no tenderness.  Musculoskeletal: He exhibits no tenderness or deformity.  Neurological: He is alert and oriented to person, place, and time.  Skin: Skin is warm and dry.  Nursing note and vitals reviewed.    ED Treatments / Results  Labs (all labs ordered are listed, but only abnormal results are displayed) Labs Reviewed  COMPREHENSIVE METABOLIC PANEL - Abnormal; Notable for the following components:      Result Value   Glucose, Bld 123 (*)    Creatinine, Ser 1.34 (*)    GFR calc non Af Amer 58 (*)    All other components within normal limits  CBC WITH DIFFERENTIAL/PLATELET - Abnormal; Notable for the following components:   RBC 5.84 (*)    All other components within normal limits    EKG None  Radiology Ct Soft Tissue Neck W Contrast  Result Date: 04/18/2018 CLINICAL DATA:  Sore throat/stridor, epiglottitis or tonsillitis suspected. Difficulty breathing. EXAM: CT NECK WITH CONTRAST TECHNIQUE: Multidetector CT imaging of the neck was performed using the standard protocol following the bolus administration of intravenous contrast. CONTRAST:  75mL OMNIPAQUE IOHEXOL 300 MG/ML  SOLN COMPARISON:  MRI head 01/16/2014.  Face CT 01/17/2012. FINDINGS: Pharynx and larynx: Palatine and lingual tonsils are enlarged and somewhat heterogeneous. No discrete abscess or drainable fluid collection is present. Asymmetric soft tissue enhancement extends into the right vallecula. There is no mass lesion. Epiglottis is within normal limits. Vocal cords are midline and symmetric. Trachea is unremarkable. The nasopharynx is clear. Salivary glands: The submandibular and parotid glands are within normal limits bilaterally. Thyroid: Normal Lymph nodes: Subcentimeter submental lymph nodes are present. A right submandibular lymph node measures 12 x 10 mm. There is mild prominence of right level 2 lymph  nodes as well. No pathologic enlargement is present. Vascular: Atherosclerotic calcifications are present in the proximal internal carotid arteries bilaterally without a significant stenosis relative to the more distal vessels. Limited intracranial: Negative. Visualized orbits: The globes and orbits are within normal limits bilaterally. Mastoids and visualized paranasal sinuses: The paranasal sinuses and mastoid air cells are clear. Skeleton: Endplate degenerative changes are most prominent at C5-6, C6-7, and C7-T1. Vertebral body heights are maintained. There is slight degenerative anterolisthesis at C4-5. Focal lytic or blastic lesions are present. Periapical lucency is present at the posterior root the second right mandibular molar. The overlying cortex is intact. No associated soft tissue swelling present. Upper chest: The apices are clear. Thoracic inlet is within normal limits. IMPRESSION: 1. Enlarged palatine and lingual tonsils with asymmetric mucosal enhancement on the right compatible with acute pharyngitis. 2. No discrete abscess or drainable fluid collection. 3. Reactive level 1 and level 2 lymph nodes, worse on the right. Electronically Signed   By: Marin Roberts M.D.   On: 04/18/2018 16:34    Procedures Procedures (including critical care time)  Medications Ordered  in ED Medications  dexamethasone (DECADRON) injection 10 mg (10 mg Intravenous Given 04/18/18 1542)  iohexol (OMNIPAQUE) 300 MG/ML solution 75 mL (75 mLs Intravenous Contrast Given 04/18/18 1559)     Initial Impression / Assessment and Plan / ED Course  I have reviewed the triage vital signs and the nursing notes.  Pertinent labs & imaging results that were available during my care of the patient were reviewed by me and considered in my medical decision making (see chart for details).    Patient presents after being seen in the ED x3 days ago and placed on antibiotics for dental infection.  He states his symptoms have  worsened.  He reports difficulty swallowing solids but able to tolerate liquids, no voice changes.  Upon examination patient is very tender along the right neck region and states the swelling feels like throat is closing.  Time I will order basic lab work on patient along with provide him with a Decadron shot to the reduce the swelling to his airway, there is no airway compromise at this time.  We will also order a CT soft tissue neck with contrast to rule out any epiglottitis, tonsillitis, Ludwig's or infection.  CMP showed elevation of creatine at 1.34 no recent results for comparison. No electrolyte abnormality. CBC showed no leukocytosis, patient is afebrile.   Patient received shot of decadron and states he feels better, comfortably sitting in bed.   CT results showed   1. Enlarged palatine and lingual tonsils with asymmetric mucosal enhancement on the right compatible with acute pharyngitis. 2. No discrete abscess or drainable fluid collection. 3. Reactive level 1 and level 2 lymph nodes, worse on the right.  Patient states he has been taken not been helping with his symptoms, at this time I will prescribe patient different antibiotic and give him an ENT follow-up for further management of his swelling.  Patient is advised to see a dentist I will also provide him a referral to the [facility] for further management of this dental pain.   Final Clinical Impressions(s) / ED Diagnoses   Final diagnoses:  Right facial swelling    ED Discharge Orders         Ordered    doxycycline (VIBRAMYCIN) 100 MG capsule  2 times daily     04/18/18 1635           Claude Manges, PA-C 04/18/18 1646    Gerhard Munch, MD 04/22/18 2106

## 2018-04-18 NOTE — Discharge Instructions (Signed)
Please stop taking Clindamycin at this time.I have given a referral for ENT please schedule an appointment this week for further evaluation of your symptoms. I have also provided a a referral to the [facility] dental [facility] for further managing of dental complaints.

## 2018-09-11 ENCOUNTER — Other Ambulatory Visit: Payer: Self-pay

## 2018-09-11 ENCOUNTER — Emergency Department (HOSPITAL_COMMUNITY): Payer: Medicaid Other

## 2018-09-11 ENCOUNTER — Inpatient Hospital Stay (HOSPITAL_COMMUNITY)
Admission: EM | Admit: 2018-09-11 | Discharge: 2018-09-16 | DRG: 871 | Disposition: A | Discharge status: Home / Self Care | Payer: Medicaid Other | Attending: Internal Medicine | Admitting: Internal Medicine

## 2018-09-11 DIAGNOSIS — N179 Acute kidney failure, unspecified: Secondary | ICD-10-CM

## 2018-09-11 DIAGNOSIS — R0602 Shortness of breath: Secondary | ICD-10-CM

## 2018-09-11 DIAGNOSIS — A419 Sepsis, unspecified organism: Secondary | ICD-10-CM

## 2018-09-11 DIAGNOSIS — I081 Rheumatic disorders of both mitral and tricuspid valves: Secondary | ICD-10-CM | POA: Diagnosis present

## 2018-09-11 DIAGNOSIS — Z23 Encounter for immunization: Secondary | ICD-10-CM

## 2018-09-11 DIAGNOSIS — F17200 Nicotine dependence, unspecified, uncomplicated: Secondary | ICD-10-CM | POA: Diagnosis present

## 2018-09-11 DIAGNOSIS — F17210 Nicotine dependence, cigarettes, uncomplicated: Secondary | ICD-10-CM | POA: Diagnosis present

## 2018-09-11 DIAGNOSIS — J9600 Acute respiratory failure, unspecified whether with hypoxia or hypercapnia: Secondary | ICD-10-CM

## 2018-09-11 DIAGNOSIS — I428 Other cardiomyopathies: Secondary | ICD-10-CM

## 2018-09-11 DIAGNOSIS — J9601 Acute respiratory failure with hypoxia: Secondary | ICD-10-CM | POA: Diagnosis present

## 2018-09-11 DIAGNOSIS — E876 Hypokalemia: Secondary | ICD-10-CM | POA: Diagnosis not present

## 2018-09-11 DIAGNOSIS — Z880 Allergy status to penicillin: Secondary | ICD-10-CM | POA: Diagnosis not present

## 2018-09-11 DIAGNOSIS — J189 Pneumonia, unspecified organism: Secondary | ICD-10-CM

## 2018-09-11 DIAGNOSIS — I2510 Atherosclerotic heart disease of native coronary artery without angina pectoris: Secondary | ICD-10-CM | POA: Diagnosis present

## 2018-09-11 DIAGNOSIS — J181 Lobar pneumonia, unspecified organism: Secondary | ICD-10-CM

## 2018-09-11 DIAGNOSIS — I5041 Acute combined systolic (congestive) and diastolic (congestive) heart failure: Secondary | ICD-10-CM | POA: Diagnosis present

## 2018-09-11 DIAGNOSIS — J90 Pleural effusion, not elsewhere classified: Secondary | ICD-10-CM

## 2018-09-11 DIAGNOSIS — I130 Hypertensive heart and chronic kidney disease with heart failure and stage 1 through stage 4 chronic kidney disease, or unspecified chronic kidney disease: Secondary | ICD-10-CM | POA: Diagnosis present

## 2018-09-11 DIAGNOSIS — R7989 Other specified abnormal findings of blood chemistry: Secondary | ICD-10-CM

## 2018-09-11 DIAGNOSIS — N183 Chronic kidney disease, stage 3 (moderate): Secondary | ICD-10-CM | POA: Diagnosis present

## 2018-09-11 DIAGNOSIS — I472 Ventricular tachycardia: Secondary | ICD-10-CM | POA: Diagnosis present

## 2018-09-11 DIAGNOSIS — J440 Chronic obstructive pulmonary disease with acute lower respiratory infection: Secondary | ICD-10-CM | POA: Diagnosis present

## 2018-09-11 DIAGNOSIS — R52 Pain, unspecified: Secondary | ICD-10-CM

## 2018-09-11 DIAGNOSIS — Z720 Tobacco use: Secondary | ICD-10-CM

## 2018-09-11 DIAGNOSIS — I420 Dilated cardiomyopathy: Secondary | ICD-10-CM | POA: Diagnosis present

## 2018-09-11 DIAGNOSIS — R945 Abnormal results of liver function studies: Secondary | ICD-10-CM

## 2018-09-11 DIAGNOSIS — I4729 Other ventricular tachycardia: Secondary | ICD-10-CM

## 2018-09-11 DIAGNOSIS — I272 Pulmonary hypertension, unspecified: Secondary | ICD-10-CM | POA: Diagnosis present

## 2018-09-11 DIAGNOSIS — I5042 Chronic combined systolic (congestive) and diastolic (congestive) heart failure: Secondary | ICD-10-CM

## 2018-09-11 HISTORY — DX: Acute kidney failure, unspecified: N17.9

## 2018-09-11 HISTORY — DX: Sepsis, unspecified organism: A41.9

## 2018-09-11 HISTORY — DX: Tobacco use: Z72.0

## 2018-09-11 HISTORY — DX: Acute respiratory failure, unspecified whether with hypoxia or hypercapnia: J96.00

## 2018-09-11 HISTORY — DX: Pneumonia, unspecified organism: J18.9

## 2018-09-11 LAB — COMPREHENSIVE METABOLIC PANEL
ALT: 169 U/L — ABNORMAL HIGH (ref 0–44)
AST: 104 U/L — AB (ref 15–41)
Albumin: 3.6 g/dL (ref 3.5–5.0)
Alkaline Phosphatase: 106 U/L (ref 38–126)
Anion gap: 12 (ref 5–15)
BUN: 13 mg/dL (ref 6–20)
CO2: 23 mmol/L (ref 22–32)
Calcium: 8.8 mg/dL — ABNORMAL LOW (ref 8.9–10.3)
Chloride: 106 mmol/L (ref 98–111)
Creatinine, Ser: 1.65 mg/dL — ABNORMAL HIGH (ref 0.61–1.24)
GFR calc Af Amer: 53 mL/min — ABNORMAL LOW (ref >60)
GFR, EST NON AFRICAN AMERICAN: 46 mL/min — AB (ref >60)
Glucose, Bld: 118 mg/dL — ABNORMAL HIGH (ref 70–99)
POTASSIUM: 3.9 mmol/L (ref 3.5–5.1)
Sodium: 141 mmol/L (ref 135–145)
Total Bilirubin: 1.7 mg/dL — ABNORMAL HIGH (ref 0.3–1.2)
Total Protein: 6.3 g/dL — ABNORMAL LOW (ref 6.5–8.1)

## 2018-09-11 LAB — CBC WITH DIFFERENTIAL/PLATELET
Abs Immature Granulocytes: 0.03 10*3/uL (ref 0.00–0.07)
Basophils Absolute: 0 10*3/uL (ref 0.0–0.1)
Basophils Relative: 0 %
Eosinophils Absolute: 0 10*3/uL (ref 0.0–0.5)
Eosinophils Relative: 0 %
HCT: 53.6 % — ABNORMAL HIGH (ref 39.0–52.0)
Hemoglobin: 16.5 g/dL (ref 13.0–17.0)
Immature Granulocytes: 0 %
Lymphocytes Relative: 21 %
Lymphs Abs: 1.8 10*3/uL (ref 0.7–4.0)
MCH: 27.6 pg (ref 26.0–34.0)
MCHC: 30.8 g/dL (ref 30.0–36.0)
MCV: 89.6 fL (ref 80.0–100.0)
Monocytes Absolute: 0.8 10*3/uL (ref 0.1–1.0)
Monocytes Relative: 10 %
Neutro Abs: 6 10*3/uL (ref 1.7–7.7)
Neutrophils Relative %: 69 %
PLATELETS: 313 10*3/uL (ref 150–400)
RBC: 5.98 MIL/uL — AB (ref 4.22–5.81)
RDW: 13.8 % (ref 11.5–15.5)
WBC: 8.7 10*3/uL (ref 4.0–10.5)
nRBC: 0 % (ref 0.0–0.2)

## 2018-09-11 LAB — BRAIN NATRIURETIC PEPTIDE: B Natriuretic Peptide: 1771.8 pg/mL — ABNORMAL HIGH (ref 0.0–100.0)

## 2018-09-11 LAB — TROPONIN I: Troponin I: 0.04 ng/mL (ref <0.03)

## 2018-09-11 LAB — I-STAT CG4 LACTIC ACID, ED: LACTIC ACID, VENOUS: 3.09 mmol/L — AB (ref 0.5–1.9)

## 2018-09-11 MED ORDER — SODIUM CHLORIDE 0.9 % IV SOLN
INTRAVENOUS | Status: DC
  Administered 2018-09-11 – 2018-09-12 (×2): via INTRAVENOUS

## 2018-09-11 MED ORDER — ALBUTEROL SULFATE (2.5 MG/3ML) 0.083% IN NEBU: 5.0000 mg | INHALATION_SOLUTION | Freq: Once | RESPIRATORY_TRACT | Status: DC

## 2018-09-11 MED ORDER — IPRATROPIUM-ALBUTEROL 0.5-2.5 (3) MG/3ML IN SOLN
RESPIRATORY_TRACT | Status: AC
  Filled 2018-09-11: qty 3

## 2018-09-11 MED ORDER — IPRATROPIUM-ALBUTEROL 0.5-2.5 (3) MG/3ML IN SOLN
3.0000 mL | Freq: Once | RESPIRATORY_TRACT | Status: AC
  Administered 2018-09-11: 3 mL via RESPIRATORY_TRACT
  Filled 2018-09-11: qty 3

## 2018-09-11 MED ORDER — SODIUM CHLORIDE 0.9 % IV SOLN
2.0000 g | INTRAVENOUS | Status: DC
  Administered 2018-09-11 – 2018-09-13 (×3): 2 g via INTRAVENOUS
  Filled 2018-09-11 (×3): qty 20

## 2018-09-11 MED ORDER — PNEUMOCOCCAL VAC POLYVALENT 25 MCG/0.5ML IJ INJ
0.5000 mL | INJECTION | INTRAMUSCULAR | Status: DC
  Filled 2018-09-11: qty 0.5

## 2018-09-11 MED ORDER — METHYLPREDNISOLONE SODIUM SUCC 125 MG IJ SOLR
80.0000 mg | Freq: Two times a day (BID) | INTRAMUSCULAR | Status: DC
  Administered 2018-09-11 – 2018-09-13 (×4): 80 mg via INTRAVENOUS
  Filled 2018-09-11 (×3): qty 2

## 2018-09-11 MED ORDER — IPRATROPIUM-ALBUTEROL 0.5-2.5 (3) MG/3ML IN SOLN
3.0000 mL | Freq: Four times a day (QID) | RESPIRATORY_TRACT | Status: DC
  Administered 2018-09-12: 3 mL via RESPIRATORY_TRACT
  Filled 2018-09-11: qty 3

## 2018-09-11 MED ORDER — SODIUM CHLORIDE 0.9 % IV SOLN
500.0000 mg | INTRAVENOUS | Status: DC
  Administered 2018-09-11 – 2018-09-12 (×2): 500 mg via INTRAVENOUS
  Filled 2018-09-11 (×2): qty 500

## 2018-09-11 MED ORDER — INFLUENZA VAC SPLIT QUAD 0.5 ML IM SUSY
0.5000 mL | PREFILLED_SYRINGE | INTRAMUSCULAR | Status: AC
  Administered 2018-09-12: 0.5 mL via INTRAMUSCULAR
  Filled 2018-09-11: qty 0.5

## 2018-09-11 MED ORDER — ALBUTEROL SULFATE (2.5 MG/3ML) 0.083% IN NEBU: 2.5000 mg | INHALATION_SOLUTION | RESPIRATORY_TRACT | Status: DC | PRN

## 2018-09-11 MED ORDER — SODIUM CHLORIDE 0.9 % IV BOLUS
1000.0000 mL | Freq: Once | INTRAVENOUS | Status: AC
  Administered 2018-09-11: 1000 mL via INTRAVENOUS

## 2018-09-11 NOTE — ED Triage Notes (Signed)
Pt reports SOB, cough in triage X3 weeks.

## 2018-09-11 NOTE — H&P (Signed)
History and Physical    Daryl [name] IAX:[id number] DOB: 05-Dec-1960 DOA: 09/11/2018  PCP: Patient, No Pcp Per  Patient coming from: Home  Chief Complaint: Cough fever shortness of breath  HPI: Daryl [name] is a 58 y.o. male with medical history significant of asthma, tobacco abuse comes in with an illness that is been going on for 2 weeks but progressive worsening coughing shortness of breath fevers and chills at home.  Patient reports he has been having a productive cough.  He quit smoking a week ago and believes this is all related to him quitting smoking.  He denies any lower extremity swelling or edema.  He denies any chest pain.  No hemoptysis.  His girlfriend has been trying to get him to come to a doctor for over a week and he is finally agreed.  He hates hospitals.  Patient found to have pneumonia and sepsis and referred for admission for such.  He is not recently been on antibiotics except for dental abscess back in August 2019.  Patient received a DuoNeb and is feeling better in the emergency department.  He is still quite tachypneic and tachycardic though.  Review of Systems: As per HPI otherwise 10 point review of systems negative.   Past Medical History:  Diagnosis Date  . Asthma     No past surgical history on file.  None   reports that he has been smoking. He has been smoking about 0.50 packs per day. He has never used smokeless tobacco. He reports current alcohol use. No history on file for drug.  He reports last alcoholic beverage was over a week ago.  He denies daily usage.  Allergies  Allergen Reactions  . Penicillins Swelling    Did it involve swelling of the face/tongue/throat, SOB, or low BP? Yes Did it involve sudden or severe rash/hives, skin peeling, or any reaction on the inside of your mouth or nose? No Did you need to seek medical attention at a hospital or doctor's office? Yes When did it last happen?young adult If all above answers are "NO", may  proceed with cephalosporin use.     No family history on file.  No premature coronary disease  Prior to Admission medications   Medication Sig Start Date End Date Taking? Authorizing Provider  OVER THE COUNTER MEDICATION Take 2 tablets by mouth 2 (two) times daily as needed (congestion).   Yes [provider]  clindamycin (CLEOCIN) 300 MG capsule Take 1 capsule (300 mg total) by mouth 3 (three) times daily. Patient not taking: Reported on 09/11/2018 03/21/18   Janne Napoleon, NP  hydrochlorothiazide (HYDRODIURIL) 25 MG tablet Take 1 tablet (25 mg total) by mouth daily. Patient not taking: Reported on 09/11/2018 01/16/14   Linwood Dibbles, MD  meclizine (ANTIVERT) 50 MG tablet Take 0.5 tablets (25 mg total) by mouth 3 (three) times daily as needed for dizziness or nausea. Patient not taking: Reported on 09/11/2018 01/16/14   Linwood Dibbles, MD  naproxen (NAPROSYN) 500 MG tablet Take 1 tablet (500 mg total) by mouth 2 (two) times daily. Patient not taking: Reported on 09/11/2018 03/21/18   Janne Napoleon, NP    Physical Exam: Vitals:   09/11/18 2130 09/11/18 2145 09/11/18 2153 09/11/18 2200  BP: (!) 146/109 (!) 138/97 (!) 138/97 (!) 141/109  Pulse:   (!) 116 (!) 119  Resp: (!) 38 (!) 36 17 (!) 38  Temp:      TempSrc:      SpO2:  100% 96%      Constitutional: NAD, calm, comfortable but ill-appearing Vitals:   09/11/18 2130 09/11/18 2145 09/11/18 2153 09/11/18 2200  BP: (!) 146/109 (!) 138/97 (!) 138/97 (!) 141/109  Pulse:   (!) 116 (!) 119  Resp: (!) 38 (!) 36 17 (!) 38  Temp:      TempSrc:      SpO2:   100% 96%   Eyes: PERRL, lids and conjunctivae normal ENMT: Mucous membranes are moist. Posterior pharynx clear of any exudate or lesions.Normal dentition.  Neck: normal, supple, no masses, no thyromegaly Respiratory: Diminished bilaterally with rhonchi and expiratory wheezing bilaterally.  Mildly increased respiratory effort. No accessory muscle use.  Cardiovascular: Regular rate and  rhythm, no murmurs / rubs / gallops. No extremity edema. 2+ pedal pulses. No carotid bruits.  Abdomen: no tenderness, no masses palpated. No hepatosplenomegaly. Bowel sounds positive.  Musculoskeletal: no clubbing / cyanosis. No joint deformity upper and lower extremities. Good ROM, no contractures. Normal muscle tone.  Skin: no rashes, lesions, ulcers. No induration Neurologic: CN 2-12 grossly intact. Sensation intact, DTR normal. Strength 5/5 in all 4.  Psychiatric: Normal judgment and insight. Alert and oriented x 3. Normal mood.    Labs on Admission: I have personally reviewed following labs and imaging studies  CBC: Recent Labs  Lab 09/11/18 2009  WBC 8.7  NEUTROABS 6.0  HGB 16.5  HCT 53.6*  MCV 89.6  PLT 313   Basic Metabolic Panel: Recent Labs  Lab 09/11/18 2009  NA 141  K 3.9  CL 106  CO2 23  GLUCOSE 118*  BUN 13  CREATININE 1.65*  CALCIUM 8.8*   GFR: CrCl cannot be calculated (Unknown ideal weight.). Liver Function Tests: Recent Labs  Lab 09/11/18 2009  AST 104*  ALT 169*  ALKPHOS 106  BILITOT 1.7*  PROT 6.3*  ALBUMIN 3.6   No results for input(s): LIPASE, AMYLASE in the last 168 hours. No results for input(s): AMMONIA in the last 168 hours. Coagulation Profile: No results for input(s): INR, PROTIME in the last 168 hours. Cardiac Enzymes: Recent Labs  Lab 09/11/18 2009  TROPONINI 0.04*   BNP (last 3 results) No results for input(s): PROBNP in the last 8760 hours. HbA1C: No results for input(s): HGBA1C in the last 72 hours. CBG: No results for input(s): GLUCAP in the last 168 hours. Lipid Profile: No results for input(s): CHOL, HDL, LDLCALC, TRIG, CHOLHDL, LDLDIRECT in the last 72 hours. Thyroid Function Tests: No results for input(s): TSH, T4TOTAL, FREET4, T3FREE, THYROIDAB in the last 72 hours. Anemia Panel: No results for input(s): VITAMINB12, FOLATE, FERRITIN, TIBC, IRON, RETICCTPCT in the last 72 hours. Urine analysis: No results  found for: COLORURINE, APPEARANCEUR, LABSPEC, PHURINE, GLUCOSEU, HGBUR, BILIRUBINUR, KETONESUR, PROTEINUR, UROBILINOGEN, NITRITE, LEUKOCYTESUR Sepsis Labs: !!!!!!!!!!!!!!!!!!!!!!!!!!!!!!!!!!!!!!!!!!!! @LABRCNTIP (procalcitonin:4,lacticidven:4) )No results found for this or any previous visit (from the past 240 hour(s)).   Radiological Exams on Admission: Dg Chest 2 View  Result Date: 09/11/2018 CLINICAL DATA:  Short of breath. Cough and fever for 2 weeks. Hypertension. Smoker. EXAM: CHEST - 2 VIEW COMPARISON:  01/19/2007 FINDINGS: Lateral view degraded by patient arm position. Midline trachea. Borderline cardiomegaly. No pleural fluid. Pulmonary interstitial thickening. Right greater than left base airspace disease. The right base airspace disease is favored to be centered in the right middle lobe. IMPRESSION: Right greater than left base airspace disease. The right middle lobe opacity is suspicious for pneumonia. Left lower lobe more patchy may may could represent atelectasis. Borderline cardiomegaly with mild pulmonary  interstitial thickening. Suspicious for pulmonary venous congestion. Electronically Signed   By: Jeronimo GreavesKyle  Talbot M.D.   On: 09/11/2018 20:19    EKG: Independently reviewed.  Sinus tachycardia no acute changes Old chart reviewed Case cussed with Dr. Jacqulyn BathLong in the ED Chest x-ray reviewed right-sided infiltrate  Assessment/Plan 58 year old male with sepsis secondary to community-acquired pneumonia Principal Problem:   PNA (pneumonia)-placed on IV and Rocephin and azithromycin.  Obtain blood and sputum cultures.  Lactic acid initial 3 will serial this until normalizes.  IV fluids ordered.  Also placed on Solu-Medrol and frequent nebulizer treatments.  Check procalcitonin level.  Active Problems:   AKI (acute kidney injury) (HCC)-Baseline renal function is normal and he is bumped up to 1.6.  All secondary to infection prerenal azotemia.  IV fluids.    Acute respiratory failure  (HCC)-with tachypnea maintaining good O2 sats.  Treat pneumonia as above.  Frequent bronchodilators.  Likely has a component of underlying COPD.    Sepsis (HCC)-antibiotics as above.  Serial lactic acid level.  Check procalcitonin level.  IV fluids.  Blood pressure stable.    Tobacco abuse-noted  Elevated LFTs-mild elevation.  IV fluids and repeat in the morning.  If continue to be elevated do further work-up.  Mild troponin elevation-at 0.04.  EKG nonischemic.  Serial troponin overnight.   Med rec pending pharmacy review   DVT prophylaxis: SCDs Code Status: Full Family Communication: Girlfriend Disposition Plan: 1 to 3 days Consults called: None Admission status: Admission   Roselynn Whitacre A MD [facility]  If 7PM-7AM, please contact night-coverage [email or web address] Password [email or web address]  09/11/2018, 10:30 PM

## 2018-09-11 NOTE — ED Notes (Signed)
I Stat LAc ACid result of 3.09 reported to Dr. Shela Commons. Long

## 2018-09-11 NOTE — ED Provider Notes (Signed)
Emergency Department Provider Note   I have reviewed the triage vital signs and the nursing notes.   HISTORY  Chief Complaint Shortness of Breath   HPI Daryl [name] is a 58 y.o. male with Daryl [name] history of tobacco use presents to the emergency department for evaluation of shortness of breath and productive cough.  Patient states that symptoms of been worsening over the past 2 weeks.  He describes some associated chest heaviness but this is more intermittent.  No modifying factors.  He has not noticed any fevers.  He states he has been coughing up phlegm and spitting it into a bedside trash can.  He denies any known sick contacts or recent travel.  Denies swelling in the legs.  He does not have a primary care physician and does not take any medication on a regular basis.  Asthma is listed in his medical chart as a prior diagnosis but he was not aware of this.   Past Medical History:  Diagnosis Date  . Asthma     Patient Active Problem List   Diagnosis Date Noted  . PNA (pneumonia) 09/11/2018  . Tobacco abuse 09/11/2018  . AKI (acute kidney injury) (HCC) 09/11/2018  . Acute respiratory failure (HCC) 09/11/2018  . Sepsis (HCC) 09/11/2018    No past surgical history on file.  Allergies Penicillins  No family history on file.  Social History Social History   Tobacco Use  . Smoking status: Current Every Day Smoker    Packs/day: 0.50  . Smokeless tobacco: Never Used  Substance Use Topics  . Alcohol use: Yes  . Drug use: Not on file    Review of Systems  Constitutional: No fever/chills Eyes: No visual changes. ENT: No sore throat. Cardiovascular: Positive chest pain. Respiratory: Positive shortness of breath and productive cough.  Gastrointestinal: No abdominal pain.  No nausea, no vomiting.  No diarrhea.  No constipation. Genitourinary: Negative for dysuria. Musculoskeletal: Negative for back pain. Skin: Negative for rash. Neurological: Negative for headaches,  focal weakness or numbness.  10-point ROS otherwise negative.  ____________________________________________   PHYSICAL EXAM:  VITAL SIGNS: ED Triage Vitals [09/11/18 1951]  Enc Vitals Group     BP (!) 139/100     Pulse Rate (!) 118     Resp 20     Temp 98 F (36.7 C)     Temp Source Oral     SpO2 100 %   Constitutional: Alert and oriented. Patient with notable tachypnea but able to provide a full history.  Eyes: Conjunctivae are normal.  Head: Atraumatic. Nose: No congestion/rhinnorhea. Mouth/Throat: Mucous membranes are slightly dry.  Neck: No stridor.  Cardiovascular: Tachycardia. Good peripheral circulation. Grossly normal heart sounds.   Respiratory: Increased respiratory effort.  No retractions. Lungs diminished at the right base. No rales. No significant wheezing.  Gastrointestinal: Soft and nontender. No distention.  Musculoskeletal: No lower extremity tenderness nor edema. No gross deformities of extremities. Neurologic:  Normal speech and language. No gross focal neurologic deficits are appreciated.  Skin:  Skin is warm, dry and intact. No rash noted.  ____________________________________________   LABS (all labs ordered are listed, but only abnormal results are displayed)  Labs Reviewed  COMPREHENSIVE METABOLIC PANEL - Abnormal; Notable for the following components:      Result Value   Glucose, Bld 118 (*)    Creatinine, Ser 1.65 (*)    Calcium 8.8 (*)    Total Protein 6.3 (*)    AST 104 (*)  ALT 169 (*)    Total Bilirubin 1.7 (*)    GFR calc non Af Amer 46 (*)    GFR calc Af Amer 53 (*)    All other components within normal limits  CBC WITH DIFFERENTIAL/PLATELET - Abnormal; Notable for the following components:   RBC 5.98 (*)    HCT 53.6 (*)    All other components within normal limits  TROPONIN I - Abnormal; Notable for the following components:   Troponin I 0.04 (*)    All other components within normal limits  BRAIN NATRIURETIC PEPTIDE -  Abnormal; Notable for the following components:   B Natriuretic Peptide 1,771.8 (*)    All other components within normal limits  I-STAT CG4 LACTIC ACID, ED - Abnormal; Notable for the following components:   Lactic Acid, Venous 3.09 (*)    All other components within normal limits  CULTURE, BLOOD (ROUTINE X 2)  CULTURE, BLOOD (ROUTINE X 2)  CULTURE, BLOOD (ROUTINE X 2)  CULTURE, BLOOD (ROUTINE X 2)  EXPECTORATED SPUTUM ASSESSMENT W REFEX TO RESP CULTURE  GRAM STAIN  RESPIRATORY PANEL BY PCR  HIV ANTIBODY (ROUTINE TESTING W REFLEX)  STREP PNEUMONIAE URINARY ANTIGEN  CBC WITH DIFFERENTIAL/PLATELET  TROPONIN I  TROPONIN I  TROPONIN I  LACTIC ACID, PLASMA  LACTIC ACID, PLASMA  PROCALCITONIN  COMPREHENSIVE METABOLIC PANEL   ____________________________________________  EKG   EKG Interpretation  Date/Time:  Saturday September 11 2018 20:51:34 EST Ventricular Rate:  114 PR Interval:  132 QRS Duration: 78 QT Interval:  340 QTC Calculation: 469 R Axis:   70 Text Interpretation:  Age not entered, assumed to be  58 years old for purpose of ECG interpretation Sinus tachycardia Ventricular premature complex Probable left atrial enlargement Probable LVH with secondary repol abnrm No STEMI.  Confirmed by Alona Bene [phone]) on 09/11/2018 8:57:58 PM       ____________________________________________  RADIOLOGY  Dg Chest 2 View  Result Date: 09/11/2018 CLINICAL DATA:  Short of breath. Cough and fever for 2 weeks. Hypertension. Smoker. EXAM: CHEST - 2 VIEW COMPARISON:  01/19/2007 FINDINGS: Lateral view degraded by patient arm position. Midline trachea. Borderline cardiomegaly. No pleural fluid. Pulmonary interstitial thickening. Right greater than left base airspace disease. The right base airspace disease is favored to be centered in the right middle lobe. IMPRESSION: Right greater than left base airspace disease. The right middle lobe opacity is suspicious for pneumonia. Left lower  lobe more patchy may may could represent atelectasis. Borderline cardiomegaly with mild pulmonary interstitial thickening. Suspicious for pulmonary venous congestion. Electronically Signed   By: Jeronimo Greaves M.D.   On: 09/11/2018 20:19    ____________________________________________   PROCEDURES  Procedure(s) performed:   Procedures  CRITICAL CARE Performed by: Maia Plan Total critical care time: 35 minutes Critical care time was exclusive of separately billable procedures and treating other patients. Critical care was necessary to treat or prevent imminent or life-threatening deterioration. Critical care was time spent personally by me on the following activities: development of treatment plan with patient and/or surrogate as well as nursing, discussions with consultants, evaluation of patient's response to treatment, examination of patient, obtaining history from patient or surrogate, ordering and performing treatments and interventions, ordering and review of laboratory studies, ordering and review of radiographic studies, pulse oximetry and re-evaluation of patient's condition.  Alona Bene, MD Emergency Medicine  ____________________________________________   INITIAL IMPRESSION / ASSESSMENT AND PLAN / ED COURSE  Pertinent labs & imaging results that were available during my  care of the patient were reviewed by me and considered in my medical decision making (see chart for details).  Patient presents to the emergency department for evaluation of shortness of breath with productive cough.  Chest x-ray from triage shows infiltrate concerning for pneumonia.  Patient does have significant tachypnea and Yesika Rispoli history of smoking.  He does not have significant wheezing but somewhat diminished air entry.  Plan for single DuoNeb and reassess.  Patient will be given coverage with antibiotics for community-acquired pneumonia.  Will follow closely after IV fluids and breathing treatment.    10:00 PM Patient somewhat improved after Duoneb. Improved air entry with course sounds. No volume overload on exam to correlate with elevated BNP. Troponin elevated but likely secondary to PNA and sepsis. Patient given CAP coverage and IVF. No BiPAP or other airway support at this time. Plan for admit.   Discussed patient's case with Hospitalist, Dr. Onalee Hua to request admission. Patient and family (if present) updated with plan. Care transferred to Hospitalist service.  I reviewed all nursing notes, vitals, pertinent old records, EKGs, labs, imaging (as available).  ____________________________________________  FINAL CLINICAL IMPRESSION(S) / ED DIAGNOSES  Final diagnoses:  Community acquired pneumonia of right middle lobe of lung (HCC)  Sepsis, due to unspecified organism, unspecified whether acute organ dysfunction present (HCC)     MEDICATIONS GIVEN DURING THIS VISIT:  Medications  cefTRIAXone (ROCEPHIN) 2 g in sodium chloride 0.9 % 100 mL IVPB (0 g Intravenous Stopped 09/11/18 2222)  azithromycin (ZITHROMAX) 500 mg in sodium chloride 0.9 % 250 mL IVPB (has no administration in time range)  sodium chloride 0.9 % bolus 1,000 mL (1,000 mLs Intravenous New Bag/Given 09/11/18 2154)  ipratropium-albuterol (DUONEB) 0.5-2.5 (3) MG/3ML nebulizer solution (  Not Given 09/11/18 2143)  0.9 %  sodium chloride infusion (has no administration in time range)  ipratropium-albuterol (DUONEB) 0.5-2.5 (3) MG/3ML nebulizer solution 3 mL (has no administration in time range)  albuterol (PROVENTIL) (2.5 MG/3ML) 0.083% nebulizer solution 2.5 mg (has no administration in time range)  methylPREDNISolone sodium succinate (SOLU-MEDROL) 125 mg/2 mL injection 80 mg (has no administration in time range)  ipratropium-albuterol (DUONEB) 0.5-2.5 (3) MG/3ML nebulizer solution 3 mL (3 mLs Nebulization Given 09/11/18 2102)    Note:  This document was prepared using Dragon voice recognition software and may include  unintentional dictation errors.  Alona Bene, MD Emergency Medicine    Evaristo Tsuda, Arlyss Repress, MD 09/11/18 2237

## 2018-09-12 ENCOUNTER — Inpatient Hospital Stay (HOSPITAL_COMMUNITY): Payer: Medicaid Other

## 2018-09-12 DIAGNOSIS — Z720 Tobacco use: Secondary | ICD-10-CM

## 2018-09-12 DIAGNOSIS — J9601 Acute respiratory failure with hypoxia: Secondary | ICD-10-CM

## 2018-09-12 DIAGNOSIS — N179 Acute kidney failure, unspecified: Secondary | ICD-10-CM

## 2018-09-12 LAB — CBC WITH DIFFERENTIAL/PLATELET
ABS IMMATURE GRANULOCYTES: 0.04 10*3/uL (ref 0.00–0.07)
Basophils Absolute: 0 10*3/uL (ref 0.0–0.1)
Basophils Relative: 0 %
Eosinophils Absolute: 0.1 10*3/uL (ref 0.0–0.5)
Eosinophils Relative: 1 %
HEMATOCRIT: 47.3 % (ref 39.0–52.0)
HEMOGLOBIN: 15.3 g/dL (ref 13.0–17.0)
Immature Granulocytes: 0 %
Lymphocytes Relative: 5 %
Lymphs Abs: 0.6 10*3/uL — ABNORMAL LOW (ref 0.7–4.0)
MCH: 28.5 pg (ref 26.0–34.0)
MCHC: 32.3 g/dL (ref 30.0–36.0)
MCV: 88.2 fL (ref 80.0–100.0)
Monocytes Absolute: 0.2 10*3/uL (ref 0.1–1.0)
Monocytes Relative: 2 %
NEUTROS ABS: 10 10*3/uL — AB (ref 1.7–7.7)
Neutrophils Relative %: 92 %
Platelets: 290 10*3/uL (ref 150–400)
RBC: 5.36 MIL/uL (ref 4.22–5.81)
RDW: 13.7 % (ref 11.5–15.5)
WBC: 10.9 10*3/uL — ABNORMAL HIGH (ref 4.0–10.5)
nRBC: 0 % (ref 0.0–0.2)

## 2018-09-12 LAB — LACTIC ACID, PLASMA
Lactic Acid, Venous: 3.5 mmol/L (ref 0.5–1.9)
Lactic Acid, Venous: 2.1 mmol/L (ref 0.5–1.9)

## 2018-09-12 LAB — BLOOD GAS, ARTERIAL
Acid-base deficit: 15 mmol/L — ABNORMAL HIGH (ref 0.0–2.0)
Bicarbonate: 10.5 mmol/L — ABNORMAL LOW (ref 20.0–28.0)
Drawn by: 252031
FIO2: 100
O2 Saturation: 99.4 %
Patient temperature: 98.6
pCO2 arterial: 22.7 mmHg — ABNORMAL LOW (ref 32.0–48.0)
pH, Arterial: 7.284 — ABNORMAL LOW (ref 7.350–7.450)
pO2, Arterial: 320 mmHg — ABNORMAL HIGH (ref 83.0–108.0)

## 2018-09-12 LAB — COMPREHENSIVE METABOLIC PANEL
ALT: 137 U/L — ABNORMAL HIGH (ref 0–44)
AST: 94 U/L — ABNORMAL HIGH (ref 15–41)
Albumin: 3 g/dL — ABNORMAL LOW (ref 3.5–5.0)
Alkaline Phosphatase: 86 U/L (ref 38–126)
Anion gap: 11 (ref 5–15)
BUN: 11 mg/dL (ref 6–20)
CO2: 18 mmol/L — ABNORMAL LOW (ref 22–32)
CREATININE: 1.51 mg/dL — AB (ref 0.61–1.24)
Calcium: 8.2 mg/dL — ABNORMAL LOW (ref 8.9–10.3)
Chloride: 108 mmol/L (ref 98–111)
GFR calc Af Amer: 59 mL/min — ABNORMAL LOW (ref >60)
GFR calc non Af Amer: 51 mL/min — ABNORMAL LOW (ref >60)
Glucose, Bld: 227 mg/dL — ABNORMAL HIGH (ref 70–99)
Potassium: 4.2 mmol/L (ref 3.5–5.1)
Sodium: 137 mmol/L (ref 135–145)
Total Bilirubin: 1.2 mg/dL (ref 0.3–1.2)
Total Protein: 5.2 g/dL — ABNORMAL LOW (ref 6.5–8.1)

## 2018-09-12 LAB — RESPIRATORY PANEL BY PCR
Adenovirus: NOT DETECTED
Bordetella pertussis: NOT DETECTED
Chlamydophila pneumoniae: NOT DETECTED
Coronavirus 229E: NOT DETECTED
Coronavirus HKU1: NOT DETECTED
Coronavirus NL63: NOT DETECTED
Coronavirus OC43: NOT DETECTED
Influenza A: NOT DETECTED
Influenza B: NOT DETECTED
Metapneumovirus: NOT DETECTED
Mycoplasma pneumoniae: NOT DETECTED
PARAINFLUENZA VIRUS 3-RVPPCR: NOT DETECTED
PARAINFLUENZA VIRUS 4-RVPPCR: NOT DETECTED
Parainfluenza Virus 1: NOT DETECTED
Parainfluenza Virus 2: NOT DETECTED
RESPIRATORY SYNCYTIAL VIRUS-RVPPCR: NOT DETECTED
RHINOVIRUS / ENTEROVIRUS - RVPPCR: DETECTED — AB

## 2018-09-12 LAB — PROCALCITONIN: Procalcitonin: 0.45 ng/mL

## 2018-09-12 LAB — TROPONIN I
TROPONIN I: 0.12 ng/mL — AB (ref <0.03)
Troponin I: 0.04 ng/mL (ref <0.03)
Troponin I: 0.07 ng/mL (ref <0.03)

## 2018-09-12 LAB — HIV ANTIBODY (ROUTINE TESTING W REFLEX): HIV Screen 4th Generation wRfx: NONREACTIVE

## 2018-09-12 LAB — GLUCOSE, CAPILLARY: Glucose-Capillary: 166 mg/dL — ABNORMAL HIGH (ref 70–99)

## 2018-09-12 LAB — STREP PNEUMONIAE URINARY ANTIGEN: Strep Pneumo Urinary Antigen: NEGATIVE

## 2018-09-12 MED ORDER — IPRATROPIUM-ALBUTEROL 0.5-2.5 (3) MG/3ML IN SOLN
3.0000 mL | Freq: Two times a day (BID) | RESPIRATORY_TRACT | Status: DC
  Administered 2018-09-12 – 2018-09-13 (×4): 3 mL via RESPIRATORY_TRACT
  Filled 2018-09-12 (×4): qty 3

## 2018-09-12 MED ORDER — SODIUM CHLORIDE 0.9 % IV BOLUS
500.0000 mL | Freq: Once | INTRAVENOUS | Status: AC
  Administered 2018-09-12: 500 mL via INTRAVENOUS

## 2018-09-12 MED ORDER — ALPRAZOLAM 0.25 MG PO TABS: 0.2500 mg | ORAL_TABLET | Freq: Once | ORAL | Status: DC

## 2018-09-12 MED ORDER — IPRATROPIUM-ALBUTEROL 0.5-2.5 (3) MG/3ML IN SOLN
3.0000 mL | RESPIRATORY_TRACT | Status: DC | PRN
  Administered 2018-09-12: 3 mL via RESPIRATORY_TRACT

## 2018-09-12 NOTE — Significant Event (Addendum)
Rapid Response Event Note  Overview:Called to room d/t increased WOB and SpO2-70s on NRB. Time Called: 2206 Arrival Time: 2210 Event Type: Respiratory  Initial Focused Assessment: Pt laying in bed, alert and oriented, diaphoretic, skin cool.  Pt SOB, using accessory muscles to breath. HR-118 (SR), BP-129/98, RR-40s, SpO2-90s on NRB.  Lungs clear bilateral upper lobes and L lower, coarse/crackles heard in R lower. ABD distented and somewhat firm. Pt denies pain but c/o difficulty breathing d/t "bloating." ABG-7.28/23/320/11. PCXR-increased R effusion, cardiomegaly. Pt calmed after some time and nasal cannula 6L placed on patient. [place] then titrated down to 4L. SpO2 not picking up very well based on waveform. Multiple sites and machines used without obtaining SpO2 value.  Sat probe placed on patient's toe with SpO2-96% with a decent waveform.         ? Whether this was some sort of panic attack vs desaturation event based on difficulty obtaining a sat from the sites used during event.   Interventions: ABG PCXR Bodenheimer to beside Duoneb given.  Plan of Care (if not transferred): Monitor SpO2 from toe site if continues to have decent waveform.  Titrate FiO2 based on SpO2.  Call RRT if further assistance needed. Event Summary: Name of Physician Notified: Bodenheimer, NP at 2210(PTA RRT)    at    Outcome: Stayed in room and stabalized  Event End Time: 2245  Terrilyn Saver

## 2018-09-12 NOTE — Significant Event (Addendum)
This RN was called by Consulting civil engineer to come to patient's room at around 2159. Found patient to be in respiratory distress and in somewhat of a panic attack as patient was getting increasingly restless. Pulse oximetry monitor was registering at 50-60% and placed patient on nonrebreather; pt still continued to be restless and trying to remove mask, but Charge RN was able to calm patient down and keep mask on. While on nonrebreather, patient's SpO2 was registering at low 80s, but waveform was not accurate. Respiratory and rapid response were called to room at 2203 and MD was paged at around 2205 to ask for an order for ABG and chest xray. MD called back at 2211 to confirm orders were placed and that they were heading to the room. Per assessment, patient's lungs were diminished with coarse heard on the right side. Patient was given PRN duoneb and reports starting to feel better. Was able to wean patient down on nasal cannula at 6L and then once again weaned down to 4L. Pt told that he will remain on O2 during the night and that continuous pulse ox will be setup to monitor the SpO2 overnight. Will continue to monitor.  ABG results: Results for Daryl [name], Daryl [name] (MRN [id number]) as of 09/12/2018 23:59  Ref. Range 09/12/2018 22:17  pH, Arterial Latest Ref Range: 7.350 - 7.450  7.284 (L)  pCO2 arterial Latest Ref Range: 32.0 - 48.0 mmHg 22.7 (L)  pO2, Arterial Latest Ref Range: 83.0 - 108.0 mmHg 320 (H)  Acid-base deficit Latest Ref Range: 0.0 - 2.0 mmol/L 15.0 (H)  Bicarbonate Latest Ref Range: 20.0 - 28.0 mmol/L 10.5 (L)  O2 Saturation Latest Units: % 99.4  Patient temperature Unknown 98.6   Chest Xray results: EXAM: PORTABLE CHEST 1 VIEW  COMPARISON:  09/11/18  FINDINGS: Stable enlarged cardiac silhouette. Increase in RIGHT pleural effusion. Potential RIGHT lower lobe airspace disease. Upper lungs are clear.  IMPRESSION: 1. Increased RIGHT effusion.  Potential parapneumonic effusion. 2. Cardiomegaly

## 2018-09-12 NOTE — Progress Notes (Signed)
PROGRESS NOTE    Daryl CholJoel L Dougher  ZOX:[id number] DOB: 11-Jan-1961 DOA: 09/11/2018 PCP: Patient, No Pcp Per    Brief Narrative:  Daryl [name] is a 58 y.o. male with medical history significant of asthma, tobacco abuse comes in with an illness that is been going on for 2 weeks but progressive worsening coughing shortness of breath fevers and chills at home.  Patient reports he has been having a productive cough.  He quit smoking a week ago and believes this is all related to him quitting smoking.  He denies any lower extremity swelling or edema.  He denies any chest pain.  No hemoptysis.  His girlfriend has been trying to get him to come to a doctor for over a week and he is finally agreed.  He hates hospitals.  Patient found to have pneumonia and sepsis and referred for admission for such.  He is not recently been on antibiotics except for dental abscess back in August 2019.  Patient received a DuoNeb and is feeling better in the emergency department.  He is still quite tachypneic and tachycardic though.   Assessment & Plan:   Principal Problem:   PNA (pneumonia) Active Problems:   Tobacco abuse   AKI (acute kidney injury) (HCC)   Acute respiratory failure (HCC)   Sepsis (HCC)   PNA (pneumonia) - continue rocephin and azithromycin - lactic acids WNL - continue IVF - consider transition to PO antibiotics tomorrow if respiratory status continues to improve - continue solumedrol and nebs - follow blood and sputum cultures. - rhinovirus positive     AKI (acute kidney injury) (HCC) - creatinine at 1.5 - continue IVF - repeat BMP in am    Tobacco abuse-noted  Elevated LFTs -mild elevation - previously normal 4 months ago - right upper quadrant U/S  Mild troponin elevation-at 0.04 - EKG showing sinus tachycardia and possible LVH - unlikely cardiac etiology causing elevation- more likely this is related to respiratory status.     DVT prophylaxis: SCDs Code Status: Full  code Family Communication: No family bedside Disposition Plan: Likely discharge in a.m   Consultants:   None  Procedures:   None  Antimicrobials:   Rocephin and Azithromycin 09/11/18   Subjective: Patient asleep at time of exam.  Upon waking has increased work of breathing when conversing, pausing frequently to catch his breath.  Reports some improvement in breathing but still states that it is difficult for him to not feel short of breath.  Does mention he occasionally needs to take breaks when eating secondary to shortness of breath.  No fever.  Tachypnea and tachycardia noted overnight.  Objective: Vitals:   09/11/18 2316 09/12/18 0103 09/12/18 0438 09/12/18 0827  BP:   (!) 127/91   Pulse:   (!) 104   Resp:   18   Temp:   98.1 F (36.7 C)   TempSrc:   Oral   SpO2:  93% 100% 99%  Weight: 78.1 kg     Height: 5\' 9"  (1.753 m)       Intake/Output Summary (Last 24 hours) at 09/12/2018 1155 Last data filed at 09/12/2018 0426 Gross per 24 hour  Intake 695.11 ml  Output 240 ml  Net 455.11 ml   Filed Weights   09/11/18 2316  Weight: 78.1 kg    Examination:  General exam: Appears calm and comfortable  Respiratory system: Increased respiratory effort, diminished breath sounds, expiratory wheezing noted in left middle lung field. Cardiovascular system: S1 & S2 heard,  RRR. No JVD, murmurs, rubs, gallops or clicks. No pedal edema. Gastrointestinal system: Abdomen is nondistended, soft and nontender. No organomegaly or masses felt. Normal bowel sounds heard. Central nervous system: Alert and oriented. No focal neurological deficits. Extremities: Symmetric 5 x 5 power. Skin: No rashes, lesions or ulcers Psychiatry: Judgement and insight appear normal. Mood & affect appropriate.     Data Reviewed: I have personally reviewed following labs and imaging studies  CBC: Recent Labs  Lab 09/11/18 2009 09/12/18 0324  WBC 8.7 10.9*  NEUTROABS 6.0 10.0*  HGB 16.5 15.3  HCT  53.6* 47.3  MCV 89.6 88.2  PLT 313 290   Basic Metabolic Panel: Recent Labs  Lab 09/11/18 2009 09/12/18 0303  NA 141 137  K 3.9 4.2  CL 106 108  CO2 23 18*  GLUCOSE 118* 227*  BUN 13 11  CREATININE 1.65* 1.51*  CALCIUM 8.8* 8.2*   GFR: Estimated Creatinine Clearance: 54.6 mL/min (A) (by C-G formula based on SCr of 1.51 mg/dL (H)). Liver Function Tests: Recent Labs  Lab 09/11/18 2009 09/12/18 0303  AST 104* 94*  ALT 169* 137*  ALKPHOS 106 86  BILITOT 1.7* 1.2  PROT 6.3* 5.2*  ALBUMIN 3.6 3.0*   No results for input(s): LIPASE, AMYLASE in the last 168 hours. No results for input(s): AMMONIA in the last 168 hours. Coagulation Profile: No results for input(s): INR, PROTIME in the last 168 hours. Cardiac Enzymes: Recent Labs  Lab 09/11/18 2009 09/12/18 0303 09/12/18 0925  TROPONINI 0.04* 0.04* 0.07*   BNP (last 3 results) No results for input(s): PROBNP in the last 8760 hours. HbA1C: No results for input(s): HGBA1C in the last 72 hours. CBG: No results for input(s): GLUCAP in the last 168 hours. Lipid Profile: No results for input(s): CHOL, HDL, LDLCALC, TRIG, CHOLHDL, LDLDIRECT in the last 72 hours. Thyroid Function Tests: No results for input(s): TSH, T4TOTAL, FREET4, T3FREE, THYROIDAB in the last 72 hours. Anemia Panel: No results for input(s): VITAMINB12, FOLATE, FERRITIN, TIBC, IRON, RETICCTPCT in the last 72 hours. Sepsis Labs: Recent Labs  Lab 09/11/18 2035 09/12/18 0303 09/12/18 0634  PROCALCITON  --  0.45  --   LATICACIDVEN 3.09* 3.5* 2.1*    Recent Results (from the past 240 hour(s))  Culture, sputum-assessment     Status: None (Preliminary result)   Collection Time: 09/11/18  4:13 AM  Result Value Ref Range Status   Specimen Description EXPECTORATED SPUTUM  Final   Special Requests NONE  Final   Sputum evaluation   Final    FEW WBC PRESENT, PREDOMINANTLY PMN MODERATE GRAM POSITIVE COCCI IN PAIRS MODERATE GRAM NEGATIVE RODS FEW GRAM  POSITIVE RODS Performed at [facility], [address] [place], [place] [address]    Report Status PENDING  Incomplete  Respiratory Panel by PCR     Status: Abnormal   Collection Time: 09/11/18 11:54 PM  Result Value Ref Range Status   Adenovirus NOT DETECTED NOT DETECTED Final   Coronavirus 229E NOT DETECTED NOT DETECTED Final   Coronavirus HKU1 NOT DETECTED NOT DETECTED Final   Coronavirus NL63 NOT DETECTED NOT DETECTED Final   Coronavirus OC43 NOT DETECTED NOT DETECTED Final   Metapneumovirus NOT DETECTED NOT DETECTED Final   Rhinovirus / Enterovirus DETECTED (A) NOT DETECTED Final   Influenza A NOT DETECTED NOT DETECTED Final   Influenza B NOT DETECTED NOT DETECTED Final   Parainfluenza Virus 1 NOT DETECTED NOT DETECTED Final   Parainfluenza Virus 2 NOT DETECTED NOT  DETECTED Final   Parainfluenza Virus 3 NOT DETECTED NOT DETECTED Final   Parainfluenza Virus 4 NOT DETECTED NOT DETECTED Final   Respiratory Syncytial Virus NOT DETECTED NOT DETECTED Final   Bordetella pertussis NOT DETECTED NOT DETECTED Final   Chlamydophila pneumoniae NOT DETECTED NOT DETECTED Final   Mycoplasma pneumoniae NOT DETECTED NOT DETECTED Final         Radiology Studies: Dg Chest 2 View  Result Date: 09/11/2018 CLINICAL DATA:  Short of breath. Cough and fever for 2 weeks. Hypertension. Smoker. EXAM: CHEST - 2 VIEW COMPARISON:  01/19/2007 FINDINGS: Lateral view degraded by patient arm position. Midline trachea. Borderline cardiomegaly. No pleural fluid. Pulmonary interstitial thickening. Right greater than left base airspace disease. The right base airspace disease is favored to be centered in the right middle lobe. IMPRESSION: Right greater than left base airspace disease. The right middle lobe opacity is suspicious for pneumonia. Left lower lobe more patchy may may could represent atelectasis. Borderline cardiomegaly with mild pulmonary interstitial thickening. Suspicious for pulmonary venous  congestion. Electronically Signed   By: Jeronimo Greaves M.D.   On: 09/11/2018 20:19        Scheduled Meds: . ipratropium-albuterol  3 mL Nebulization BID  . methylPREDNISolone (SOLU-MEDROL) injection  80 mg Intravenous Q12H  . pneumococcal 23 valent vaccine  0.5 mL Intramuscular Tomorrow-1000   Continuous Infusions: . sodium chloride 75 mL/hr at 09/12/18 0426  . azithromycin Stopped (09/11/18 2353)  . cefTRIAXone (ROCEPHIN)  IV Stopped (09/11/18 2222)     LOS: 1 day    Time spent: 40 minutes    Katrinka Blazing, MD [facility] Pager [phone]  If 7PM-7AM, please contact night-coverage [email or web address] Password [facility] 09/12/2018, 11:55 AM

## 2018-09-12 NOTE — Progress Notes (Signed)
Called to pt's bedside for low sats.  Pt found on Nonrebreather mask @ 15 lpm.  Pt noted to be very sob/tachypneic, agitated, sweating with cool clammy skin.  Unable to assess accurate Spo2.  Rapid Response called for pt.  ABG collected with following results:  Ph 7.28, Co2 23, PO2 320, Bicarb 11.  Physician notified at bedside.  Pt has calmed notably with a decrease in sob/wob. Given prn neb tx and placed on 4 lpm [place].  Pt continues to have sporadic bouts of agitation.  Will cont to monitor.

## 2018-09-12 NOTE — Plan of Care (Signed)
  Problem: Spiritual Needs Goal: Ability to function at adequate level Outcome: Progressing   Problem: Education: Goal: Knowledge of General Education information will improve Description Including pain rating scale, medication(s)/side effects and non-pharmacologic comfort measures Outcome: Progressing   Problem: Clinical Measurements: Goal: Ability to maintain clinical measurements within normal limits will improve Outcome: Progressing Goal: Diagnostic test results will improve Outcome: Progressing Goal: Respiratory complications will improve Outcome: Progressing   Problem: Safety: Goal: Ability to remain free from injury will improve Outcome: Progressing

## 2018-09-13 ENCOUNTER — Inpatient Hospital Stay (HOSPITAL_COMMUNITY): Payer: Medicaid Other

## 2018-09-13 ENCOUNTER — Encounter (HOSPITAL_COMMUNITY): Payer: Self-pay | Admitting: Radiology

## 2018-09-13 DIAGNOSIS — I5021 Acute systolic (congestive) heart failure: Secondary | ICD-10-CM

## 2018-09-13 DIAGNOSIS — A419 Sepsis, unspecified organism: Principal | ICD-10-CM

## 2018-09-13 DIAGNOSIS — R0602 Shortness of breath: Secondary | ICD-10-CM

## 2018-09-13 DIAGNOSIS — R945 Abnormal results of liver function studies: Secondary | ICD-10-CM

## 2018-09-13 HISTORY — PX: IR THORACENTESIS ASP PLEURAL SPACE W/IMG GUIDE: IMG5380

## 2018-09-13 LAB — COMPREHENSIVE METABOLIC PANEL
ALT: 193 U/L — ABNORMAL HIGH (ref 0–44)
AST: 167 U/L — ABNORMAL HIGH (ref 15–41)
Albumin: 2.9 g/dL — ABNORMAL LOW (ref 3.5–5.0)
Alkaline Phosphatase: 105 U/L (ref 38–126)
Anion gap: 13 (ref 5–15)
BUN: 18 mg/dL (ref 6–20)
CALCIUM: 8.7 mg/dL — AB (ref 8.9–10.3)
CO2: 18 mmol/L — ABNORMAL LOW (ref 22–32)
Chloride: 108 mmol/L (ref 98–111)
Creatinine, Ser: 1.53 mg/dL — ABNORMAL HIGH (ref 0.61–1.24)
GFR calc Af Amer: 58 mL/min — ABNORMAL LOW (ref >60)
GFR calc non Af Amer: 50 mL/min — ABNORMAL LOW (ref >60)
Glucose, Bld: 138 mg/dL — ABNORMAL HIGH (ref 70–99)
Potassium: 4.7 mmol/L (ref 3.5–5.1)
Sodium: 139 mmol/L (ref 135–145)
TOTAL PROTEIN: 5.4 g/dL — AB (ref 6.5–8.1)
Total Bilirubin: 0.7 mg/dL (ref 0.3–1.2)

## 2018-09-13 LAB — GRAM STAIN: SPECIAL REQUESTS: NORMAL

## 2018-09-13 LAB — CBC
HCT: 47.6 % (ref 39.0–52.0)
Hemoglobin: 15.8 g/dL (ref 13.0–17.0)
MCH: 28.6 pg (ref 26.0–34.0)
MCHC: 33.2 g/dL (ref 30.0–36.0)
MCV: 86.2 fL (ref 80.0–100.0)
NRBC: 0.1 % (ref 0.0–0.2)
Platelets: 298 10*3/uL (ref 150–400)
RBC: 5.52 MIL/uL (ref 4.22–5.81)
RDW: 13.5 % (ref 11.5–15.5)
WBC: 22 10*3/uL — ABNORMAL HIGH (ref 4.0–10.5)

## 2018-09-13 LAB — LACTATE DEHYDROGENASE: LDH: 289 U/L — AB (ref 98–192)

## 2018-09-13 LAB — LACTATE DEHYDROGENASE, PLEURAL OR PERITONEAL FLUID: LD, Fluid: 40 U/L — ABNORMAL HIGH (ref 3–23)

## 2018-09-13 LAB — PROTEIN, PLEURAL OR PERITONEAL FLUID: Total protein, fluid: <3 g/dL

## 2018-09-13 LAB — ECHOCARDIOGRAM COMPLETE
Height: 69 in
Weight: 2754.87 oz

## 2018-09-13 LAB — EXPECTORATED SPUTUM ASSESSMENT W GRAM STAIN, RFLX TO RESP C

## 2018-09-13 LAB — MAGNESIUM: Magnesium: 1.9 mg/dL (ref 1.7–2.4)

## 2018-09-13 LAB — BRAIN NATRIURETIC PEPTIDE: B Natriuretic Peptide: 1876.5 pg/mL — ABNORMAL HIGH (ref 0.0–100.0)

## 2018-09-13 MED ORDER — ISOSORB DINITRATE-HYDRALAZINE 20-37.5 MG PO TABS
1.0000 | ORAL_TABLET | Freq: Two times a day (BID) | ORAL | Status: DC
  Administered 2018-09-13 – 2018-09-16 (×6): 1 via ORAL
  Filled 2018-09-13 (×6): qty 1

## 2018-09-13 MED ORDER — LIDOCAINE HCL 1 % IJ SOLN
INTRAMUSCULAR | Status: AC | PRN
  Administered 2018-09-13: 10 mL

## 2018-09-13 MED ORDER — LORAZEPAM 1 MG PO TABS: 1.0000 mg | ORAL_TABLET | Freq: Four times a day (QID) | ORAL | Status: DC | PRN

## 2018-09-13 MED ORDER — LORAZEPAM 2 MG/ML IJ SOLN: 0.0000 mg | Freq: Four times a day (QID) | INTRAMUSCULAR | Status: AC

## 2018-09-13 MED ORDER — MAGNESIUM SULFATE IN D5W 1-5 GM/100ML-% IV SOLN
1.0000 g | Freq: Once | INTRAVENOUS | Status: AC
  Administered 2018-09-13: 1 g via INTRAVENOUS
  Filled 2018-09-13: qty 100

## 2018-09-13 MED ORDER — DOXYCYCLINE HYCLATE 100 MG PO TABS
100.0000 mg | ORAL_TABLET | Freq: Two times a day (BID) | ORAL | Status: DC
  Administered 2018-09-13 – 2018-09-16 (×7): 100 mg via ORAL
  Filled 2018-09-13 (×7): qty 1

## 2018-09-13 MED ORDER — LORAZEPAM 2 MG/ML IJ SOLN: 1.0000 mg | Freq: Four times a day (QID) | INTRAMUSCULAR | Status: DC | PRN

## 2018-09-13 MED ORDER — VITAMIN B-1 100 MG PO TABS
100.0000 mg | ORAL_TABLET | Freq: Every day | ORAL | Status: DC
  Administered 2018-09-13 – 2018-09-16 (×3): 100 mg via ORAL
  Filled 2018-09-13 (×3): qty 1

## 2018-09-13 MED ORDER — ATORVASTATIN CALCIUM 40 MG PO TABS
40.0000 mg | ORAL_TABLET | Freq: Every day | ORAL | Status: DC
  Administered 2018-09-13 – 2018-09-15 (×3): 40 mg via ORAL
  Filled 2018-09-13 (×3): qty 1

## 2018-09-13 MED ORDER — LIDOCAINE HCL 1 % IJ SOLN
INTRAMUSCULAR | Status: AC
  Filled 2018-09-13: qty 20

## 2018-09-13 MED ORDER — PREDNISONE 50 MG PO TABS
50.0000 mg | ORAL_TABLET | Freq: Every day | ORAL | Status: DC
  Administered 2018-09-14: 50 mg via ORAL
  Filled 2018-09-13: qty 1

## 2018-09-13 MED ORDER — ADULT MULTIVITAMIN W/MINERALS CH
1.0000 | ORAL_TABLET | Freq: Every day | ORAL | Status: DC
  Administered 2018-09-13 – 2018-09-16 (×3): 1 via ORAL
  Filled 2018-09-13 (×3): qty 1

## 2018-09-13 MED ORDER — CHLORDIAZEPOXIDE HCL 5 MG PO CAPS
15.0000 mg | ORAL_CAPSULE | Freq: Three times a day (TID) | ORAL | Status: DC
  Administered 2018-09-13 – 2018-09-16 (×8): 15 mg via ORAL
  Filled 2018-09-13 (×8): qty 3

## 2018-09-13 MED ORDER — HEPARIN SODIUM (PORCINE) 5000 UNIT/ML IJ SOLN
5000.0000 [IU] | Freq: Three times a day (TID) | INTRAMUSCULAR | Status: DC
  Administered 2018-09-13 – 2018-09-16 (×7): 5000 [IU] via SUBCUTANEOUS
  Filled 2018-09-13 (×7): qty 1

## 2018-09-13 MED ORDER — CARVEDILOL 3.125 MG PO TABS
3.1250 mg | ORAL_TABLET | Freq: Two times a day (BID) | ORAL | Status: DC
  Administered 2018-09-13 – 2018-09-16 (×6): 3.125 mg via ORAL
  Filled 2018-09-13 (×6): qty 1

## 2018-09-13 MED ORDER — LIDOCAINE HCL 1 % IJ SOLN
INTRAMUSCULAR | Status: DC | PRN
  Administered 2018-09-13: 10 mL

## 2018-09-13 MED ORDER — ASPIRIN 81 MG PO CHEW
81.0000 mg | CHEWABLE_TABLET | Freq: Every day | ORAL | Status: DC
  Administered 2018-09-13 – 2018-09-16 (×3): 81 mg via ORAL
  Filled 2018-09-13 (×3): qty 1

## 2018-09-13 MED ORDER — FUROSEMIDE 10 MG/ML IJ SOLN
40.0000 mg | Freq: Two times a day (BID) | INTRAMUSCULAR | Status: DC
  Administered 2018-09-13 – 2018-09-14 (×2): 40 mg via INTRAVENOUS
  Filled 2018-09-13 (×2): qty 4

## 2018-09-13 MED ORDER — FUROSEMIDE 10 MG/ML IJ SOLN
40.0000 mg | Freq: Once | INTRAMUSCULAR | Status: AC
  Administered 2018-09-13: 40 mg via INTRAVENOUS
  Filled 2018-09-13: qty 4

## 2018-09-13 MED ORDER — FOLIC ACID 1 MG PO TABS
1.0000 mg | ORAL_TABLET | Freq: Every day | ORAL | Status: DC
  Administered 2018-09-13 – 2018-09-16 (×3): 1 mg via ORAL
  Filled 2018-09-13 (×3): qty 1

## 2018-09-13 MED ORDER — THIAMINE HCL 100 MG/ML IJ SOLN: 100.0000 mg | Freq: Every day | INTRAMUSCULAR | Status: DC

## 2018-09-13 MED ORDER — LORAZEPAM 2 MG/ML IJ SOLN: 0.0000 mg | Freq: Two times a day (BID) | INTRAMUSCULAR | Status: DC

## 2018-09-13 NOTE — Progress Notes (Signed)
Chaplain responded to spiritual consult. Patient may be interested in completing AD.  Patient was groggy and appeared to nod off when chaplain visited.  "I don't want nobody to make decisions for me.  Will you leave it?"  Chaplain left AD with patient to look over. Will be available if needed. [place] Endi Lagman  Pager [phone]

## 2018-09-13 NOTE — Progress Notes (Signed)
Notified by telemetry of pt's 13 beat run of VTach. Pt is asymptomatic. MD notified. Will continue to monitor.

## 2018-09-13 NOTE — Progress Notes (Signed)
  Echocardiogram 2D Echocardiogram has been performed.  Dennis Hegeman G Rikki Smestad 09/13/2018, 9:45 AM

## 2018-09-13 NOTE — Progress Notes (Signed)
PROGRESS NOTE                                                                                                                                                                                                             Patient Demographics:    Daryl [name], is a 58 y.o. male, DOB - Oct 24, 1960, IOM:[id number]  Admit date - 09/11/2018   Admitting Physician Haydee Monica, MD  Outpatient Primary MD for the patient is Patient, No Pcp Per  LOS - 2  Chief Complaint  Patient presents with  . Shortness of Breath       Brief Narrative  Daryl [name] is a 58 y.o. male with medical history significant of asthma, tobacco abuse comes in with an illness that is been going on for 2 weeks but progressive worsening coughing shortness of breath fevers and chills at home.  Patient reports he has been having a productive cough.   Subjective:    Daryl [name] today has, No headache, No chest pain, No abdominal pain - No Nausea, No new weakness tingling or numbness, No Cough - +ve SOB.     Assessment  & Plan :     1. Sepsis with acute hypoxic respiratory failure in the setting of rhinovirus infection with superimposed bacterial infection.  He had significant hypoxia, he has been maintained on empiric IV antibiotics which will be continued, does have been negative thus far.  Continue supportive care with oxygen and nebulizer treatments.  Also trying to rule out concurrent CHF.  Abscess pathophysiology has resolved.  Do not think that a significant clinical signs of COPD exacerbation will rapidly taper off steroids.  2.  Flipped lateral T waves, mildly elevated troponin, elevated BNP and frequent V. tach runs.  He is asymptomatic from a V. tach standpoint, will check an echocardiogram to evaluate EF and wall motion, placed on aspirin, low-dose beta-blocker along with statin.  Given a gram of magnesium this morning, and follow electrolytes closely.  Will request cardiology to evaluate.  Is a smoker and may  require ischemic work-up.  Have switched him from azithromycin to doxycycline due to his ongoing asymptomatic V. tach runs.  3.  Right-sided 4.3 mm lung nodule and free-flowing right-sided pleural effusion.  Does not have pleuritic chest pain but has significant white count, could have parapneumonic effusion, will request radiology to tap, appropriate labs will be ordered.  Will require outpatient pulmonary follow-up for his lung nodule and he has significant smoking history.  4.  Elevated LFTs.  Possible cirrhosis on ultrasound.  Will check  hepatitis panel, does have alcohol history, denies alcohol abuse, will have outpatient GI follow-up.  5.  ARF on CKD 3.  Baseline creatinine around 1.5.  Improved after hydration, monitor.  6.  Smoking history.  Counseled to quit.  Will recheck his alcohol consumption for now he denies any excessive use.    Family Communication  :  None  Code Status :  Full  Disposition Plan  :  TBD  Consults  :  Cards  Procedures  :    TTE  CT chest - 1. Moderate layering pleural effusion with atelectasis on the right. 2. Generalized airway thickening. 3. Cardiomegaly and coronary atherosclerosis. 4. 3 mm right lower lobe pulmonary nodule, consider chest CT without contrast in 1 year in this smoker. 5. Mild emphysema.   RUQ [place] - mild ascites with ?  Cirrhosis  DVT Prophylaxis  :    Heparin started 09/13/2018  Lab Results  Component Value Date   PLT 298 09/13/2018    Diet :  Diet Order            Diet regular Room service appropriate? Yes; Fluid consistency: Thin  Diet effective now               Inpatient Medications Scheduled Meds: . ALPRAZolam  0.25 mg Oral Once  . carvedilol  3.125 mg Oral BID WC  . doxycycline  100 mg Oral Q12H  . furosemide  40 mg Intravenous Once  . ipratropium-albuterol  3 mL Nebulization BID  . methylPREDNISolone (SOLU-MEDROL) injection  80 mg Intravenous Q12H  . pneumococcal 23 valent vaccine  0.5 mL Intramuscular  Tomorrow-1000   Continuous Infusions: . cefTRIAXone (ROCEPHIN)  IV Stopped (09/12/18 2329)   PRN Meds:.albuterol, ipratropium-albuterol  Antibiotics  :   Anti-infectives (From admission, onward)   Start     Dose/Rate Route Frequency Ordered Stop   09/13/18 1000  doxycycline (VIBRA-TABS) tablet 100 mg     100 mg Oral Every 12 hours 09/13/18 0735     09/11/18 2045  cefTRIAXone (ROCEPHIN) 2 g in sodium chloride 0.9 % 100 mL IVPB     2 g 200 mL/hr over 30 Minutes Intravenous Every 24 hours 09/11/18 2030     09/11/18 2045  azithromycin (ZITHROMAX) 500 mg in sodium chloride 0.9 % 250 mL IVPB  Status:  Discontinued     500 mg 250 mL/hr over 60 Minutes Intravenous Every 24 hours 09/11/18 2030 09/13/18 0735          Objective:   Vitals:   09/12/18 2215 09/12/18 2228 09/13/18 0523 09/13/18 0801  BP:   (!) 124/96   Pulse:   (!) 106   Resp: 20  16   Temp:   97.8 F (36.6 C)   TempSrc:   Oral   SpO2: 100% 98% 96% 95%  Weight:      Height:        Wt Readings from Last 3 Encounters:  09/11/18 78.1 kg  01/16/12 75.9 kg     Intake/Output Summary (Last 24 hours) at 09/13/2018 1046 Last data filed at 09/13/2018 0315 Gross per 24 hour  Intake 1466.02 ml  Output -  Net 1466.02 ml     Physical Exam  Awake Alert, Oriented X 3, No new F.N deficits, Normal affect [place].AT,PERRAL Supple Neck,No JVD, No cervical lymphadenopathy appriciated.  Symmetrical Chest wall movement, Good air movement bilaterally,+ve rales RRR,No Gallops,Rubs or new Murmurs, No Parasternal Heave +  ve B.Sounds, Abd Soft, No tenderness, No organomegaly appriciated, No rebound - guarding or rigidity. No Cyanosis, Clubbing or edema, No new Rash or bruise      Data Review:    CBC Recent Labs  Lab 09/11/18 2009 09/12/18 0324 09/13/18 0753  WBC 8.7 10.9* 22.0*  HGB 16.5 15.3 15.8  HCT 53.6* 47.3 47.6  PLT 313 290 298  MCV 89.6 88.2 86.2  MCH 27.6 28.5 28.6  MCHC 30.8 32.3 33.2  RDW 13.8 13.7 13.5    LYMPHSABS 1.8 0.6*  --   MONOABS 0.8 0.2  --   EOSABS 0.0 0.1  --   BASOSABS 0.0 0.0  --     Chemistries  Recent Labs  Lab 09/11/18 2009 09/12/18 0303 09/13/18 0459 09/13/18 0753  NA 141 137 139  --   K 3.9 4.2 4.7  --   CL 106 108 108  --   CO2 23 18* 18*  --   GLUCOSE 118* 227* 138*  --   BUN 13 11 18   --   CREATININE 1.65* 1.51* 1.53*  --   CALCIUM 8.8* 8.2* 8.7*  --   MG  --   --   --  1.9  AST 104* 94* 167*  --   ALT 169* 137* 193*  --   ALKPHOS 106 86 105  --   BILITOT 1.7* 1.2 0.7  --    ------------------------------------------------------------------------------------------------------------------ No results for input(s): CHOL, HDL, LDLCALC, TRIG, CHOLHDL, LDLDIRECT in the last 72 hours.  No results found for: HGBA1C ------------------------------------------------------------------------------------------------------------------ No results for input(s): TSH, T4TOTAL, T3FREE, THYROIDAB in the last 72 hours.  Invalid input(s): FREET3 ------------------------------------------------------------------------------------------------------------------ No results for input(s): VITAMINB12, FOLATE, FERRITIN, TIBC, IRON, RETICCTPCT in the last 72 hours.  Coagulation profile No results for input(s): INR, PROTIME in the last 168 hours.  No results for input(s): DDIMER in the last 72 hours.  Cardiac Enzymes Recent Labs  Lab 09/12/18 0303 09/12/18 0925 09/12/18 1620  TROPONINI 0.04* 0.07* 0.12*   ------------------------------------------------------------------------------------------------------------------    Component Value Date/Time   BNP 1,876.5 (H) 09/13/2018 0753    Micro Results Recent Results (from the past 240 hour(s))  Culture, sputum-assessment     Status: None (Preliminary result)   Collection Time: 09/11/18  4:13 AM  Result Value Ref Range Status   Specimen Description EXPECTORATED SPUTUM  Final   Special Requests NONE  Final   Sputum  evaluation   Final    FEW WBC PRESENT, PREDOMINANTLY PMN MODERATE GRAM POSITIVE COCCI IN PAIRS MODERATE GRAM NEGATIVE RODS FEW GRAM POSITIVE RODS Performed at [facility], [address] [place], [place] [address]    Report Status PENDING  Incomplete  Blood Culture (routine x 2)     Status: None (Preliminary result)   Collection Time: 09/11/18  8:53 PM  Result Value Ref Range Status   Specimen Description BLOOD LEFT HAND  Final   Special Requests   Final    BOTTLES DRAWN AEROBIC AND ANAEROBIC Blood Culture adequate volume   Culture   Final    NO GROWTH 2 DAYS Performed at [facility], [address] [place], [place] [address]    Report Status PENDING  Incomplete  Blood Culture (routine x 2)     Status: None (Preliminary result)   Collection Time: 09/11/18  9:00 PM  Result Value Ref Range Status   Specimen Description BLOOD RIGHT HAND  Final   Special Requests   Final    BOTTLES DRAWN  AEROBIC AND ANAEROBIC Blood Culture adequate volume   Culture   Final    NO GROWTH 2 DAYS Performed at [facility], [address], [place], [place] [address]    Report Status PENDING  Incomplete  Respiratory Panel by PCR     Status: Abnormal   Collection Time: 09/11/18 11:54 PM  Result Value Ref Range Status   Adenovirus NOT DETECTED NOT DETECTED Final   Coronavirus 229E NOT DETECTED NOT DETECTED Final   Coronavirus HKU1 NOT DETECTED NOT DETECTED Final   Coronavirus NL63 NOT DETECTED NOT DETECTED Final   Coronavirus OC43 NOT DETECTED NOT DETECTED Final   Metapneumovirus NOT DETECTED NOT DETECTED Final   Rhinovirus / Enterovirus DETECTED (A) NOT DETECTED Final   Influenza A NOT DETECTED NOT DETECTED Final   Influenza B NOT DETECTED NOT DETECTED Final   Parainfluenza Virus 1 NOT DETECTED NOT DETECTED Final   Parainfluenza Virus 2 NOT DETECTED NOT DETECTED Final   Parainfluenza Virus 3 NOT DETECTED NOT DETECTED Final   Parainfluenza Virus 4 NOT DETECTED NOT DETECTED Final    Respiratory Syncytial Virus NOT DETECTED NOT DETECTED Final   Bordetella pertussis NOT DETECTED NOT DETECTED Final   Chlamydophila pneumoniae NOT DETECTED NOT DETECTED Final   Mycoplasma pneumoniae NOT DETECTED NOT DETECTED Final    Radiology Reports Dg Chest 2 View  Result Date: 09/11/2018 CLINICAL DATA:  Short of breath. Cough and fever for 2 weeks. Hypertension. Smoker. EXAM: CHEST - 2 VIEW COMPARISON:  01/19/2007 FINDINGS: Lateral view degraded by patient arm position. Midline trachea. Borderline cardiomegaly. No pleural fluid. Pulmonary interstitial thickening. Right greater than left base airspace disease. The right base airspace disease is favored to be centered in the right middle lobe. IMPRESSION: Right greater than left base airspace disease. The right middle lobe opacity is suspicious for pneumonia. Left lower lobe more patchy may may could represent atelectasis. Borderline cardiomegaly with mild pulmonary interstitial thickening. Suspicious for pulmonary venous congestion. Electronically Signed   By: Jeronimo Greaves M.D.   On: 09/11/2018 20:19   Ct Chest Wo Contrast  Result Date: 09/13/2018 CLINICAL DATA:  Cough.  Follow-up unresolved pneumonia EXAM: CT CHEST WITHOUT CONTRAST TECHNIQUE: Multidetector CT imaging of the chest was performed following the standard protocol without IV contrast. COMPARISON:  Chest x-ray from yesterday FINDINGS: Cardiovascular: Cardiomegaly. Multifocal coronary atherosclerotic calcification. No acute vascular finding without contrast. Mediastinum/Nodes: Negative for adenopathy or mass. Lungs/Pleura: Moderate layering right pleural effusion. Mild atelectasis in the right lower lung. Generalized airway thickening and mild apical emphysema. 3 mm right lower lobe pulmonary nodule at the superior segment. Upper Abdomen: Negative Musculoskeletal: No acute or aggressive finding. Thoracic spondylosis. Gynecomastia IMPRESSION: 1. Moderate layering pleural effusion with  atelectasis on the right. 2. Generalized airway thickening. 3. Cardiomegaly and coronary atherosclerosis. 4. 3 mm right lower lobe pulmonary nodule, consider chest CT without contrast in 1 year in this smoker. 5. Mild emphysema. Electronically Signed   By: Marnee Spring M.D.   On: 09/13/2018 09:42   US Abdomen Complete  Result Date: 09/13/2018 CLINICAL DATA:  Initial evaluation for acute right upper quadrant pain EXAM: ABDOMEN ULTRASOUND COMPLETE COMPARISON:  None. FINDINGS: Gallbladder: No stones or sludge within the gallbladder lumen. Gallbladder wall thickened up to 7 mm, suspected to be related to overall volume status. No free pericholecystic fluid. No sonographic Murphy sign elicited on exam. Common bile duct: Diameter: 6.4 mm. Liver: No focal lesion identified. Diffusely echogenic echotexture within the liver, which can be seen in  the setting of steatosis or cirrhosis. No significant hepatic nodularity. Portal vein is patent on color Doppler imaging with normal direction of blood flow towards the liver. IVC: No abnormality visualized. Pancreas: Not visualized. Spleen: Size and appearance within normal limits. Right Kidney: Length: 12.8 cm. Echogenicity within normal limits. No mass or hydronephrosis visualized. Left Kidney: Length: 8.9 cm. Left kidney poorly visualized, but appears somewhat atrophic. Echogenicity within normal limits. No mass or hydronephrosis visualized. Abdominal aorta: No aneurysm visualized. Other findings: Note made of a right pleural effusion. Small volume ascites seen adjacent to the liver. IMPRESSION: 1. Diffusely increased echogenicity within the hepatic parenchyma, which can be seen in the setting of steatosis or possibly cirrhosis. Correlation with LFTs recommended. 2. Small volume ascites within the upper abdomen. 3. Small right pleural effusion. 4. Mild gallbladder wall thickening, suspected to be related to ascites and/or volume status. No cholelithiasis or additional  sonographic features to suggest acute cholecystitis. No biliary dilatation. 5. Mildly small and atrophic left kidney. Electronically Signed   By: Rise Mu M.D.   On: 09/13/2018 04:53   Dg Chest Port 1 View  Result Date: 09/12/2018 CLINICAL DATA:  Short of breath, labored breathing EXAM: PORTABLE CHEST 1 VIEW COMPARISON:  09/11/18 FINDINGS: Stable enlarged cardiac silhouette. Increase in RIGHT pleural effusion. Potential RIGHT lower lobe airspace disease. Upper lungs are clear. IMPRESSION: 1. Increased RIGHT effusion.  Potential parapneumonic effusion. 2. Cardiomegaly Electronically Signed   By: Genevive Bi M.D.   On: 09/12/2018 23:04    Time Spent in minutes  30   Susa Raring M.D on 09/13/2018 at 10:46 AM  To page go to [email or web address] - password [email or web address]

## 2018-09-13 NOTE — Consult Note (Addendum)
Cardiology Consultation:   Patient ID: Daryl [name]; [id number]; 08/19/1961   Admit date: 09/11/2018 Date of Consult: 09/13/2018  Primary Care Provider: Patient, No Pcp Per Primary Cardiologist: New to [facility]  Patient Profile:   Daryl [name] is a 58 y.o. male with a hx of asthma and ongoing tobacco abuse with no prior hx of CAD who is being seen today for the evaluation of elevated troponin and new LV dysfunction at the request of Dr. Thedore Mins.  History of Present Illness:   Daryl [name] is a 58 year old male with a history stated above who presented to [facility] on 09/11/2018 after a 2-week illness with progressively worsening coughing, SOB, fever and chills.  He reports that he quit smoking approximately 1 week ago and states that his symptoms began shortly after. He denies chest pain, orthopnea symptoms, PND, BLE, dizziness, palpitations, N/V or syncope.  He states that he is not regularly followed by a physician and given that he did not get any better over the course of several weeks, his significant other suggested that he come to the ED for further evaluation. Upon arrival, a CXR was performed which was consistent with PNA with right greater than left airspace disease with right middle lobe opacity suspicious for pneumonia. There was borderline cardiomegaly with mild pulmonary interstitial thickening suspicious for pulmonary venous congestion. He received one DuoNeb in the emergency department with some relief.  He was placed on IV Rocephin and azithromycin.  Blood and sputum cultures were obtained. Initial lactic acid was 3.09>3.5>2.5 and IV fluid hydration was initiated.   Upon presentation, troponin level was found to be mildly elevated at 0.04 with no ACS symptoms and no EKG changes. His LFT's were found to be elevated and therefore an abdominal ultrasound was performed which revealed diffusely increased echogenicity within the hepatic parenchyma which can be seen in the setting of steatosis or  possibly cirrhosis with correlation with LFTs recommended.  There was small volume ascites within the upper abdomen with right pleural effusion. Based on previous BMET result, he had evidence of AKI. His lactic acid was also found to be abnormal at greater than 3.0 on presentation.  Follow-up chest CT today, 09/13/2018 showed moderate layering pleural effusion with atelectasis on the right with generalized airway thickening, cardiomegaly and coronary atherosclerosis. There was a 3 mm right lower lobe pulmonary nodule with consideration for chest CT without contrast in approximately 1 year.   Today, there was concern for PNA with an underlying acute CHF component given his rising troponin and EKG changes today with T wave inversions in lateral leads. Echocardiogram was performed which showed an LVEF of 20% with diffuse hypokinesis with restrictive physiology, indicative of decreased left ventricular diastolic compliance and/or increased left atrial pressure, moderate to severe TR with a peak RV/RA gradient of and moderate to severe MR with central regurgitation   Cardiology has been asked to evaluate given the above. He continues to report no chest pain symptoms. He recently stopped smoking and denies alcohol or illicit drug use. He denies family or personal history of CAD   Past Medical History:  Diagnosis Date  . Asthma     No past surgical history on file.   Prior to Admission medications   Medication Sig Start Date End Date Taking? Authorizing Provider  OVER THE COUNTER MEDICATION Take 2 tablets by mouth 2 (two) times daily as needed (congestion).   Yes [provider]  clindamycin (CLEOCIN) 300 MG capsule Take 1 capsule (  300 mg total) by mouth 3 (three) times daily. Patient not taking: Reported on 09/11/2018 03/21/18   Janne Napoleon, NP  hydrochlorothiazide (HYDRODIURIL) 25 MG tablet Take 1 tablet (25 mg total) by mouth daily. Patient not taking: Reported on 09/11/2018 01/16/14    Linwood Dibbles, MD  meclizine (ANTIVERT) 50 MG tablet Take 0.5 tablets (25 mg total) by mouth 3 (three) times daily as needed for dizziness or nausea. Patient not taking: Reported on 09/11/2018 01/16/14   Linwood Dibbles, MD  naproxen (NAPROSYN) 500 MG tablet Take 1 tablet (500 mg total) by mouth 2 (two) times daily. Patient not taking: Reported on 09/11/2018 03/21/18   Janne Napoleon, NP    Inpatient Medications: Scheduled Meds: . ALPRAZolam  0.25 mg Oral Once  . aspirin  81 mg Oral Daily  . atorvastatin  40 mg Oral q1800  . carvedilol  3.125 mg Oral BID WC  . chlordiazePOXIDE  15 mg Oral TID  . doxycycline  100 mg Oral Q12H  . folic acid  1 mg Oral Daily  . heparin injection (subcutaneous)  5,000 Units Subcutaneous Q8H  . ipratropium-albuterol  3 mL Nebulization BID  . LORazepam  0-4 mg Intravenous Q6H   Followed by  . [START ON 09/15/2018] LORazepam  0-4 mg Intravenous Q12H  . multivitamin with minerals  1 tablet Oral Daily  . pneumococcal 23 valent vaccine  0.5 mL Intramuscular Tomorrow-1000  . [START ON 09/14/2018] predniSONE  50 mg Oral Q breakfast  . thiamine  100 mg Oral Daily   Or  . thiamine  100 mg Intravenous Daily   Continuous Infusions: . cefTRIAXone (ROCEPHIN)  IV Stopped (09/12/18 2329)   PRN Meds: albuterol, ipratropium-albuterol, LORazepam **OR** LORazepam  Allergies:    Allergies  Allergen Reactions  . Penicillins Swelling    Did it involve swelling of the face/tongue/throat, SOB, or low BP? Yes Did it involve sudden or severe rash/hives, skin peeling, or any reaction on the inside of your mouth or nose? No Did you need to seek medical attention at a hospital or doctor's office? Yes When did it last happen?young adult If all above answers are "NO", may proceed with cephalosporin use.     Social History:   Social History   Socioeconomic History  . Marital status: Single    Spouse name: Not on file  . Number of children: Not on file  . Years of education:  Not on file  . Highest education level: Not on file  Occupational History  . Not on file  Social Needs  . Financial resource strain: Not on file  . Food insecurity:    Worry: Not on file    Inability: Not on file  . Transportation needs:    Medical: Not on file    Non-medical: Not on file  Tobacco Use  . Smoking status: Current Every Day Smoker    Packs/day: 0.50  . Smokeless tobacco: Never Used  Substance and Sexual Activity  . Alcohol use: Yes  . Drug use: Not on file  . Sexual activity: Not on file  Lifestyle  . Physical activity:    Days per week: Not on file    Minutes per session: Not on file  . Stress: Not on file  Relationships  . Social connections:    Talks on phone: Not on file    Gets together: Not on file    Attends religious service: Not on file    Active member of club or organization: Not  on file    Attends meetings of clubs or organizations: Not on file    Relationship status: Not on file  . Intimate partner violence:    Fear of current or ex partner: Not on file    Emotionally abused: Not on file    Physically abused: Not on file    Forced sexual activity: Not on file  Other Topics Concern  . Not on file  Social History Narrative  . Not on file    Family History:   No family history on file. Family Status:  No family status information on file.    ROS:  Please see the history of present illness.  All other ROS reviewed and negative.     Physical Exam/Data:   Vitals:   09/12/18 2215 09/12/18 2228 09/13/18 0523 09/13/18 0801  BP:   (!) 124/96   Pulse:   (!) 106   Resp: 20  16   Temp:   97.8 F (36.6 C)   TempSrc:   Oral   SpO2: 100% 98% 96% 95%  Weight:      Height:        Intake/Output Summary (Last 24 hours) at 09/13/2018 1221 Last data filed at 09/13/2018 0315 Gross per 24 hour  Intake 1466.02 ml  Output -  Net 1466.02 ml   Filed Weights   09/11/18 2316  Weight: 78.1 kg   Body mass index is 25.43 kg/m.   General: Well  developed, well nourished, NAD Skin: Warm, dry, intact  Head: Normocephalic, atraumatic, clear, moist mucus membranes. Neck: Negative for carotid bruits. No JVD Lungs: Bilateral rhonchi. No wheezes. Breathing is mildly labored with communication.  Cardiovascular: RRR with S1 S2. + murmur. No rubs, gallops, or LV heave appreciated. Abdomen: Soft, non-tender, non-distended with normoactive bowel sounds. No obvious abdominal masses. MSK: Strength and tone appear normal for age. 5/5 in all extremities Extremities: No edema. No clubbing or cyanosis. DP/PT pulses 2+ bilaterally Neuro: Alert and oriented. No focal deficits. No facial asymmetry. MAE spontaneously. Psych: Responds to questions appropriately with normal affect.     EKG:  The EKG was personally reviewed and demonstrates: 09/13/18 NSR with T wave inversion in lateral leads, changed form admission tracing  Telemetry:  Telemetry was personally reviewed and demonstrates: 09/13/18 NSR with intermittent episodes of NSVT  Relevant CV Studies:  ECHO: 09/13/18:  Study Conclusions  - Left ventricle: The cavity size was mildly dilated. Wall   thickness was normal. The estimated ejection fraction was 20%.   Diffuse hypokinesis. Doppler parameters are consistent with   restrictive physiology, indicative of decreased left ventricular   diastolic compliance and/or increased left atrial pressure. - Aortic valve: There was no stenosis. - Mitral valve: There was moderate to severe central regurgitation,   probably functional. - Left atrium: The atrium was severely dilated. - Right ventricle: The cavity size was mildly dilated. Systolic   function was mildly reduced. - Right atrium: The atrium was moderately dilated. - Tricuspid valve: There was moderate-severe regurgitation.   Visually looks moderate but there is systolic flow reversal in   the hepatic vein doppler pattern. Peak RV-RA gradient (S): 25 mm   Hg. - Pulmonary arteries: PA  peak pressure: 40 mm Hg (S). - Systemic veins: IVC measured 2.25 cm with < 50% respirophasic   variation, suggesting RA pressure 15 mmHg.  Impressions:  - Mildly dilated LV with severe diffuse hypokinesis, EF 20%.   Restrictive diastolic function. Mildly dilated RV with mildly   decreased  systolic function. Biatrial enlargement.   Moderate-severe central MR, probably function. Moderate-severe   TR. Mild pulmonary hypertension. Dilated IVC suggestive of   elevated RV filling presure.  CATH: None   Laboratory Data:  Chemistry Recent Labs  Lab 09/11/18 2009 09/12/18 0303 09/13/18 0459  NA 141 137 139  K 3.9 4.2 4.7  CL 106 108 108  CO2 23 18* 18*  GLUCOSE 118* 227* 138*  BUN 13 11 18   CREATININE 1.65* 1.51* 1.53*  CALCIUM 8.8* 8.2* 8.7*  GFRNONAA 46* 51* 50*  GFRAA 53* 59* 58*  ANIONGAP 12 11 13     Total Protein  Date Value Ref Range Status  09/13/2018 5.4 (L) 6.5 - 8.1 g/dL Final   Albumin  Date Value Ref Range Status  09/13/2018 2.9 (L) 3.5 - 5.0 g/dL Final   AST  Date Value Ref Range Status  09/13/2018 167 (H) 15 - 41 U/L Final   ALT  Date Value Ref Range Status  09/13/2018 193 (H) 0 - 44 U/L Final   Alkaline Phosphatase  Date Value Ref Range Status  09/13/2018 105 38 - 126 U/L Final   Total Bilirubin  Date Value Ref Range Status  09/13/2018 0.7 0.3 - 1.2 mg/dL Final   Hematology Recent Labs  Lab 09/11/18 2009 09/12/18 0324 09/13/18 0753  WBC 8.7 10.9* 22.0*  RBC 5.98* 5.36 5.52  HGB 16.5 15.3 15.8  HCT 53.6* 47.3 47.6  MCV 89.6 88.2 86.2  MCH 27.6 28.5 28.6  MCHC 30.8 32.3 33.2  RDW 13.8 13.7 13.5  PLT 313 290 298   Cardiac Enzymes Recent Labs  Lab 09/11/18 2009 09/12/18 0303 09/12/18 0925 09/12/18 1620  TROPONINI 0.04* 0.04* 0.07* 0.12*   No results for input(s): TROPIPOC in the last 168 hours.  BNP Recent Labs  Lab 09/11/18 2009 09/13/18 0753  BNP 1,771.8* 1,610.9*    DDimer No results for input(s): DDIMER in the last  168 hours. TSH: No results found for: TSH Lipids:No results found for: CHOL, HDL, LDLCALC, LDLDIRECT, TRIG, CHOLHDL HgbA1c:No results found for: HGBA1C  Radiology/Studies:  Dg Chest 2 View  Result Date: 09/11/2018 CLINICAL DATA:  Short of breath. Cough and fever for 2 weeks. Hypertension. Smoker. EXAM: CHEST - 2 VIEW COMPARISON:  01/19/2007 FINDINGS: Lateral view degraded by patient arm position. Midline trachea. Borderline cardiomegaly. No pleural fluid. Pulmonary interstitial thickening. Right greater than left base airspace disease. The right base airspace disease is favored to be centered in the right middle lobe. IMPRESSION: Right greater than left base airspace disease. The right middle lobe opacity is suspicious for pneumonia. Left lower lobe more patchy may may could represent atelectasis. Borderline cardiomegaly with mild pulmonary interstitial thickening. Suspicious for pulmonary venous congestion. Electronically Signed   By: Jeronimo Greaves M.D.   On: 09/11/2018 20:19   Ct Chest Wo Contrast  Result Date: 09/13/2018 CLINICAL DATA:  Cough.  Follow-up unresolved pneumonia EXAM: CT CHEST WITHOUT CONTRAST TECHNIQUE: Multidetector CT imaging of the chest was performed following the standard protocol without IV contrast. COMPARISON:  Chest x-ray from yesterday FINDINGS: Cardiovascular: Cardiomegaly. Multifocal coronary atherosclerotic calcification. No acute vascular finding without contrast. Mediastinum/Nodes: Negative for adenopathy or mass. Lungs/Pleura: Moderate layering right pleural effusion. Mild atelectasis in the right lower lung. Generalized airway thickening and mild apical emphysema. 3 mm right lower lobe pulmonary nodule at the superior segment. Upper Abdomen: Negative Musculoskeletal: No acute or aggressive finding. Thoracic spondylosis. Gynecomastia IMPRESSION: 1. Moderate layering pleural effusion with atelectasis on the right. 2. Generalized  airway thickening. 3. Cardiomegaly and  coronary atherosclerosis. 4. 3 mm right lower lobe pulmonary nodule, consider chest CT without contrast in 1 year in this smoker. 5. Mild emphysema. Electronically Signed   By: Marnee SpringJonathon  Watts M.D.   On: 09/13/2018 09:42   [place] Abdomen Complete  Result Date: 09/13/2018 CLINICAL DATA:  Initial evaluation for acute right upper quadrant pain EXAM: ABDOMEN ULTRASOUND COMPLETE COMPARISON:  None. FINDINGS: Gallbladder: No stones or sludge within the gallbladder lumen. Gallbladder wall thickened up to 7 mm, suspected to be related to overall volume status. No free pericholecystic fluid. No sonographic Murphy sign elicited on exam. Common bile duct: Diameter: 6.4 mm. Liver: No focal lesion identified. Diffusely echogenic echotexture within the liver, which can be seen in the setting of steatosis or cirrhosis. No significant hepatic nodularity. Portal vein is patent on color Doppler imaging with normal direction of blood flow towards the liver. IVC: No abnormality visualized. Pancreas: Not visualized. Spleen: Size and appearance within normal limits. Right Kidney: Length: 12.8 cm. Echogenicity within normal limits. No mass or hydronephrosis visualized. Left Kidney: Length: 8.9 cm. Left kidney poorly visualized, but appears somewhat atrophic. Echogenicity within normal limits. No mass or hydronephrosis visualized. Abdominal aorta: No aneurysm visualized. Other findings: Note made of a right pleural effusion. Small volume ascites seen adjacent to the liver. IMPRESSION: 1. Diffusely increased echogenicity within the hepatic parenchyma, which can be seen in the setting of steatosis or possibly cirrhosis. Correlation with LFTs recommended. 2. Small volume ascites within the upper abdomen. 3. Small right pleural effusion. 4. Mild gallbladder wall thickening, suspected to be related to ascites and/or volume status. No cholelithiasis or additional sonographic features to suggest acute cholecystitis. No biliary dilatation. 5.  Mildly small and atrophic left kidney. Electronically Signed   By: Rise MuBenjamin  McClintock M.D.   On: 09/13/2018 04:53   Dg Chest Port 1 View  Result Date: 09/12/2018 CLINICAL DATA:  Short of breath, labored breathing EXAM: PORTABLE CHEST 1 VIEW COMPARISON:  09/11/18 FINDINGS: Stable enlarged cardiac silhouette. Increase in RIGHT pleural effusion. Potential RIGHT lower lobe airspace disease. Upper lungs are clear. IMPRESSION: 1. Increased RIGHT effusion.  Potential parapneumonic effusion. 2. Cardiomegaly Electronically Signed   By: Genevive BiStewart  Edmunds M.D.   On: 09/12/2018 23:04   Assessment and Plan:   1.  Elevated troponin with new systolic CHF and no history of CAD: -Patient presented with pneumonia with symptoms including fever, chills and persistently worsening cough and shortness of breath for approximately 2 weeks, found to have pneumonia per CXR  -Started on IV antibiotics per primary team  -Troponin, found to be mildly elevated 0.04 with no ACS symptoms.  -Repeat serial trend with mild elevation at 0.07, 0.12 -BNP found to be markedly elevated at 1876 -Will add IV Lasix 40mg  twice daily and monitor fluid volume status as well as renal function  -Initial EKG without acute ischemic changes>>follow up EKG today with T wave inversions in lateral leads with rising trop level  -Echocardiogram obtained with LVEF found to be severely depressed at 20% with hypokinesis and restrictive diastolic function. There is bi-atrial enlargement and moderate to severe central MR and moderate to severe TR with mild pulmonary HTN -Mg+ 1.9 this AM, repleted per primary team  -Started on ASA 81, Lipitor 40, carvedilol 3.125 -Initial troponin elevation thought to be consistent with acute respiratory illness and AKI however given persistently climbing trop with EKG changes and new LV dysfunction, will need further workup with cardiac catheterization based on echocardiogram results.  Ischemic versus viral induced  cardiomyopathy?  -Currently chest pain free  2.  PNA with sepsis and acute respiratory failure: -Patient with ongoing illness with symptoms including fever, chills, persistently worsening cough and shortness of breath for approximately 2 weeks.   -Upon presentation CXR confirmed PNA and he was started on IV azithromycin and Rocephin per internal medicine -Chest CT today with pleural effusions -Initial lactic acid, greater than 3.0 with IV fluid hydration initiated -BP stable -Continue with maintaining adequate O2 saturations with bronchodilators  3. Acute kidney injury: -Creatinine, 1.53 today (was 1.65 on presentation) with a remote baseline of 1.0 -Likely in the setting of fluid volume overload given aggressive IV fluid hydration in the setting of sepsis at admission  -Continue to trend with daily BMET  -I&O, net positive 1.9L  -Weight, 172lb on 09/11/18 -Will add Lasix 40mg  twice daily and reassess fluid volume status tomorrow  4. Ongoing tobacco use: -CXR with early emphysemic changes and a 70mm right lower lobe pulmonary nodule, consider chest CT without contrast in 1 year in this smoker -Will need to be followed with pul,onary medicine versus PCP  -Cessation strongly encouraged   5. Elevated LFT's:  -AST-167, ALT-193  -Abdominal US performed with possible cirrhosis on ultrasound -Denies ETOH use   -Will need OP follow up per IM  6. Pleural effusion: -Per chest CT with free flowing right sided pleural effusion>>>to be drained by IR today   7. NSVT: -Per telemetry review, pt has had intermittent, asymptomatic episodes of NSVT -Mg+ WNL this AM, however repleted by IM  -NSVT likely in the setting of severe LV dysfunction  -Continue to monitor electrolytes closely   8. Moderate to severe MR/TR: -Per echocardiogram performed today with severely depressed LV function at 20% with diffuse hypokinesis and moderate to severe MR and TR  -TR with moderate-severe regurgitation.  Visually looks moderate but there is systolic flow reversal in the hepatic vein doppler pattern. Peak RV-RA gradient (S): 25 mmHg. -See echo report above     For questions or updates, please contact [facility] Please consult [email or web address] for contact info under Cardiology/STEMI.   SignedGeorgie Chard NP-C [facility] Pager: [phone] 09/13/2018 12:21 PM  History and all data above reviewed.  Patient examined.  I agree with the findings as above.  The patient presents with acute SOB.  He has no prior cardiac history.  He reports that he has had dyspnea for a few to several days.  He is vague about onset.  he says "I went to take a puff on a cigarette and I started coughing and could not breathe. "  He is being treated for pneumonia but has a large pleural effusion, elevated LFTs and evidence of pulmonary vascular congestion.  Now found to have a newly decreased EF.  No chest pain.  He does have coronary calcium and cardiovascular risk factors.  He has ETOH use.  The patient exam reveals COR:RRR  ,  Lungs: Decreased breath sounds left base, right clear s/p thoracentesis  ,  Abd: Mildly distended, Ext Trace edema  .  All available labs, radiology testing, previous records reviewed. Agree with documented assessment and plan.  Acute systolic HF:  Etiology is not clear.  Will likely need right and left heart cath this admission.  (Possibly Wed pending his renal function.)  Will need med med titration.  I will start a low dose beta blocker.  Hold off on ACE or ARB pending his renal function.  I discussed salt and  fluid and will change diet and consult a dietician.  We talked about fluid restriction and abstaining from alcohol.  I do not think that he needs heparin. Continue ASAJames Necie [name]  4:38 PM  09/13/2018

## 2018-09-14 DIAGNOSIS — I5041 Acute combined systolic (congestive) and diastolic (congestive) heart failure: Secondary | ICD-10-CM

## 2018-09-14 DIAGNOSIS — J90 Pleural effusion, not elsewhere classified: Secondary | ICD-10-CM

## 2018-09-14 DIAGNOSIS — I5042 Chronic combined systolic (congestive) and diastolic (congestive) heart failure: Secondary | ICD-10-CM

## 2018-09-14 LAB — COMPREHENSIVE METABOLIC PANEL
ALT: 201 U/L — ABNORMAL HIGH (ref 0–44)
AST: 150 U/L — ABNORMAL HIGH (ref 15–41)
Albumin: 2.6 g/dL — ABNORMAL LOW (ref 3.5–5.0)
Alkaline Phosphatase: 95 U/L (ref 38–126)
Anion gap: 8 (ref 5–15)
BUN: 27 mg/dL — ABNORMAL HIGH (ref 6–20)
CO2: 26 mmol/L (ref 22–32)
Calcium: 8.2 mg/dL — ABNORMAL LOW (ref 8.9–10.3)
Chloride: 105 mmol/L (ref 98–111)
Creatinine, Ser: 1.62 mg/dL — ABNORMAL HIGH (ref 0.61–1.24)
GFR calc Af Amer: 54 mL/min — ABNORMAL LOW (ref >60)
GFR calc non Af Amer: 47 mL/min — ABNORMAL LOW (ref >60)
Glucose, Bld: 118 mg/dL — ABNORMAL HIGH (ref 70–99)
Potassium: 4.1 mmol/L (ref 3.5–5.1)
Sodium: 139 mmol/L (ref 135–145)
Total Bilirubin: 0.5 mg/dL (ref 0.3–1.2)
Total Protein: 4.6 g/dL — ABNORMAL LOW (ref 6.5–8.1)

## 2018-09-14 LAB — CBC
HCT: 42.4 % (ref 39.0–52.0)
Hemoglobin: 13.8 g/dL (ref 13.0–17.0)
MCH: 27.6 pg (ref 26.0–34.0)
MCHC: 32.5 g/dL (ref 30.0–36.0)
MCV: 84.8 fL (ref 80.0–100.0)
PLATELETS: 301 10*3/uL (ref 150–400)
RBC: 5 MIL/uL (ref 4.22–5.81)
RDW: 13.4 % (ref 11.5–15.5)
WBC: 21.1 10*3/uL — AB (ref 4.0–10.5)
nRBC: 0 % (ref 0.0–0.2)

## 2018-09-14 LAB — HEPATITIS PANEL, ACUTE
HCV Ab: 0.1 s/co ratio (ref 0.0–0.9)
HEP B C IGM: NEGATIVE
Hep A IgM: NEGATIVE
Hepatitis B Surface Ag: NEGATIVE

## 2018-09-14 LAB — PH, BODY FLUID: PH, BODY FLUID: 7.7

## 2018-09-14 MED ORDER — FUROSEMIDE 40 MG PO TABS
40.0000 mg | ORAL_TABLET | Freq: Every day | ORAL | Status: DC
  Administered 2018-09-15 – 2018-09-16 (×2): 40 mg via ORAL
  Filled 2018-09-14 (×2): qty 1

## 2018-09-14 MED ORDER — CEFDINIR 300 MG PO CAPS
300.0000 mg | ORAL_CAPSULE | Freq: Two times a day (BID) | ORAL | Status: DC
  Administered 2018-09-14 – 2018-09-15 (×2): 300 mg via ORAL
  Filled 2018-09-14 (×5): qty 1

## 2018-09-14 NOTE — Progress Notes (Signed)
Pt run ventricular bigeminy for about 20sec. On assessment pt asymptomatic and VS stable. Schorr, NP notified. Will continue to monitor pt.

## 2018-09-14 NOTE — Progress Notes (Signed)
At 1625 patient stated need letter faxed to his public defender excusing him from his court date scheduled 1/23 since he has a scheduled cardiac catheterization on 1/23 and will still be hospitalized. Unit secretary faxed document completed by Bronwen Betters PA, however, after 5PM so no confirmation document received. Informed night nurse to inform day nurse/unit secretary to attempt to re-fax document morning of 1/22. Please fax to:   [phone]  Trixie Dredge Assistant Public Defender [facility]  Patient expressed concerns about cardiac catheterization scheduled for 1/22 at 8:30. Informed patient can provide with written patient education information and offered to have someone from cardiology team visit patient to discuss procedure further. Patient declined to have member of cardio team visit. Cardio PA [place] Jamelle Haring stated will attempt to round on patient before procedure tomorrow morning and suggested show patient educational video. Patient declined educational video twice during the evening. Informed patient to notify night nurse when he would like to view video.   Called pharmacy about Omnicef capsule to be administered. Pharmacy indicated out of stock and awaiting stock from [place]. MD notified. Medication arrived to floor during evening shift.

## 2018-09-14 NOTE — Progress Notes (Addendum)
Progress Note  Patient Name: Daryl [name] Date of Encounter: 09/14/2018  Primary Cardiologist: Rollene Rotunda, MD   Subjective   Pt reports feeling much better today. He noticed the most improvement after thoracentesis. Breathing better. No longer on supplemental O2. Able to lay flat in bed w/o orthopnea and PND. He denies CP.   Inpatient Medications    Scheduled Meds: . ALPRAZolam  0.25 mg Oral Once  . aspirin  81 mg Oral Daily  . atorvastatin  40 mg Oral q1800  . carvedilol  3.125 mg Oral BID WC  . chlordiazePOXIDE  15 mg Oral TID  . doxycycline  100 mg Oral Q12H  . folic acid  1 mg Oral Daily  . furosemide  40 mg Intravenous BID  . heparin injection (subcutaneous)  5,000 Units Subcutaneous Q8H  . isosorbide-hydrALAZINE  1 tablet Oral BID  . LORazepam  0-4 mg Intravenous Q6H   Followed by  . [START ON 09/15/2018] LORazepam  0-4 mg Intravenous Q12H  . multivitamin with minerals  1 tablet Oral Daily  . pneumococcal 23 valent vaccine  0.5 mL Intramuscular Tomorrow-1000  . predniSONE  50 mg Oral Q breakfast  . thiamine  100 mg Oral Daily   Or  . thiamine  100 mg Intravenous Daily   Continuous Infusions: . cefTRIAXone (ROCEPHIN)  IV 2 g (09/13/18 2135)   PRN Meds: albuterol, ipratropium-albuterol, lidocaine, LORazepam **OR** LORazepam   Vital Signs    Vitals:   09/14/18 0051 09/14/18 0459 09/14/18 0552 09/14/18 0846  BP: 100/66 (!) 79/48 106/78 116/86  Pulse: 97 94 94   Resp: 16 (!) 25 16   Temp: 99 F (37.2 C) 98.2 F (36.8 C) 97.7 F (36.5 C)   TempSrc: Oral Oral Oral   SpO2: 98%  94%   Weight:      Height:        Intake/Output Summary (Last 24 hours) at 09/14/2018 0852 Last data filed at 09/13/2018 1315 Gross per 24 hour  Intake -  Output 1050 ml  Net -1050 ml   Last 3 Weights 09/11/2018 01/16/2012  Weight (lbs) 172 lb 2.9 oz 167 lb 6.4 oz  Weight (kg) 78.1 kg 75.932 kg      Telemetry    Currently NSR. HR in the 90s. He had 2 runs of NSVT, (7 beats  and 10 beats), also ventricular bigeminy and trigeminy  - Personally Reviewed  ECG    09/13/18, sinus tach, PVcs - Personally Reviewed  Physical Exam   GEN: middle aged AAM, in no acute distress.   Neck: No JVD Cardiac: RRR, no murmurs, rubs, or gallops.  Respiratory: slightly decreased BS in the RLL, no crackles GI: Soft, nontender, non-distended  MS: No edema; No deformity. Neuro:  Nonfocal  Psych: Normal affect   Labs    Chemistry Recent Labs  Lab 09/12/18 0303 09/13/18 0459 09/14/18 0517  NA 137 139 139  K 4.2 4.7 4.1  CL 108 108 105  CO2 18* 18* 26  GLUCOSE 227* 138* 118*  BUN 11 18 27*  CREATININE 1.51* 1.53* 1.62*  CALCIUM 8.2* 8.7* 8.2*  PROT 5.2* 5.4* 4.6*  ALBUMIN 3.0* 2.9* 2.6*  AST 94* 167* 150*  ALT 137* 193* 201*  ALKPHOS 86 105 95  BILITOT 1.2 0.7 0.5  GFRNONAA 51* 50* 47*  GFRAA 59* 58* 54*  ANIONGAP 11 13 8      Hematology Recent Labs  Lab 09/12/18 0324 09/13/18 0753 09/14/18 0517  WBC 10.9* 22.0* 21.1*  RBC 5.36 5.52 5.00  HGB 15.3 15.8 13.8  HCT 47.3 47.6 42.4  MCV 88.2 86.2 84.8  MCH 28.5 28.6 27.6  MCHC 32.3 33.2 32.5  RDW 13.7 13.5 13.4  PLT 290 298 301    Cardiac Enzymes Recent Labs  Lab 09/11/18 2009 09/12/18 0303 09/12/18 0925 09/12/18 1620  TROPONINI 0.04* 0.04* 0.07* 0.12*   No results for input(s): TROPIPOC in the last 168 hours.   BNP Recent Labs  Lab 09/11/18 2009 09/13/18 0753  BNP 1,771.8* 0,865.71,876.5*     DDimer No results for input(s): DDIMER in the last 168 hours.   Radiology    Dg Chest 1 View  Result Date: 09/13/2018 CLINICAL DATA:  Status post right thoracentesis. EXAM: CHEST  1 VIEW COMPARISON:  09/12/2017. FINDINGS: Stable enlarged cardiac silhouette. The previously seen right pleural fluid is no longer demonstrated. No pneumothorax. Mild peribronchial thickening. Mild scoliosis. IMPRESSION: 1. No pneumothorax following right thoracentesis. 2. Mild bronchitic changes. 3. Stable cardiomegaly.  Electronically Signed   By: Beckie SaltsSteven  Reid M.D.   On: 09/13/2018 14:14   Ct Chest Wo Contrast  Result Date: 09/13/2018 CLINICAL DATA:  Cough.  Follow-up unresolved pneumonia EXAM: CT CHEST WITHOUT CONTRAST TECHNIQUE: Multidetector CT imaging of the chest was performed following the standard protocol without IV contrast. COMPARISON:  Chest x-ray from yesterday FINDINGS: Cardiovascular: Cardiomegaly. Multifocal coronary atherosclerotic calcification. No acute vascular finding without contrast. Mediastinum/Nodes: Negative for adenopathy or mass. Lungs/Pleura: Moderate layering right pleural effusion. Mild atelectasis in the right lower lung. Generalized airway thickening and mild apical emphysema. 3 mm right lower lobe pulmonary nodule at the superior segment. Upper Abdomen: Negative Musculoskeletal: No acute or aggressive finding. Thoracic spondylosis. Gynecomastia IMPRESSION: 1. Moderate layering pleural effusion with atelectasis on the right. 2. Generalized airway thickening. 3. Cardiomegaly and coronary atherosclerosis. 4. 3 mm right lower lobe pulmonary nodule, consider chest CT without contrast in 1 year in this smoker. 5. Mild emphysema. Electronically Signed   By: Marnee SpringJonathon  Watts M.D.   On: 09/13/2018 09:42   [place] Abdomen Complete  Result Date: 09/13/2018 CLINICAL DATA:  Initial evaluation for acute right upper quadrant pain EXAM: ABDOMEN ULTRASOUND COMPLETE COMPARISON:  None. FINDINGS: Gallbladder: No stones or sludge within the gallbladder lumen. Gallbladder wall thickened up to 7 mm, suspected to be related to overall volume status. No free pericholecystic fluid. No sonographic Murphy sign elicited on exam. Common bile duct: Diameter: 6.4 mm. Liver: No focal lesion identified. Diffusely echogenic echotexture within the liver, which can be seen in the setting of steatosis or cirrhosis. No significant hepatic nodularity. Portal vein is patent on color Doppler imaging with normal direction of blood flow  towards the liver. IVC: No abnormality visualized. Pancreas: Not visualized. Spleen: Size and appearance within normal limits. Right Kidney: Length: 12.8 cm. Echogenicity within normal limits. No mass or hydronephrosis visualized. Left Kidney: Length: 8.9 cm. Left kidney poorly visualized, but appears somewhat atrophic. Echogenicity within normal limits. No mass or hydronephrosis visualized. Abdominal aorta: No aneurysm visualized. Other findings: Note made of a right pleural effusion. Small volume ascites seen adjacent to the liver. IMPRESSION: 1. Diffusely increased echogenicity within the hepatic parenchyma, which can be seen in the setting of steatosis or possibly cirrhosis. Correlation with LFTs recommended. 2. Small volume ascites within the upper abdomen. 3. Small right pleural effusion. 4. Mild gallbladder wall thickening, suspected to be related to ascites and/or volume status. No cholelithiasis or additional sonographic features to suggest acute cholecystitis. No biliary dilatation. 5. Mildly small  and atrophic left kidney. Electronically Signed   By: Rise Mu M.D.   On: 09/13/2018 04:53   Dg Chest Port 1 View  Result Date: 09/12/2018 CLINICAL DATA:  Short of breath, labored breathing EXAM: PORTABLE CHEST 1 VIEW COMPARISON:  09/11/18 FINDINGS: Stable enlarged cardiac silhouette. Increase in RIGHT pleural effusion. Potential RIGHT lower lobe airspace disease. Upper lungs are clear. IMPRESSION: 1. Increased RIGHT effusion.  Potential parapneumonic effusion. 2. Cardiomegaly Electronically Signed   By: Genevive Bi M.D.   On: 09/12/2018 23:04   Ir Thoracentesis Asp Pleural Space W/img Guide  Result Date: 09/13/2018 INDICATION: Shortness of breath. Right-sided pleural effusion. Request for diagnostic and therapeutic thoracentesis. EXAM: ULTRASOUND GUIDED RIGHT THORACENTESIS MEDICATIONS: None. COMPLICATIONS: None immediate. PROCEDURE: An ultrasound guided thoracentesis was thoroughly  discussed with the patient and questions answered. The benefits, risks, alternatives and complications were also discussed. The patient understands and wishes to proceed with the procedure. Written consent was obtained. Ultrasound was performed to localize and mark an adequate pocket of fluid in the right chest. The area was then prepped and draped in the normal sterile fashion. 1% Lidocaine was used for local anesthesia. Under ultrasound guidance a 6 Fr Safe-T-Centesis catheter was introduced. Thoracentesis was performed. The catheter was removed and a dressing applied. FINDINGS: A total of approximately 1 L of slightly hazy yellow fluid was removed. Samples were sent to the laboratory as requested by the clinical team. IMPRESSION: Successful ultrasound guided right thoracentesis yielding 1 L of pleural fluid. Read by: Brayton El PA-C Electronically Signed   By: Richarda Overlie M.D.   On: 09/13/2018 14:15    Cardiac Studies   ECHO: 09/13/18:  Study Conclusions  - Left ventricle: The cavity size was mildly dilated. Wall thickness was normal. The estimated ejection fraction was 20%. Diffuse hypokinesis. Doppler parameters are consistent with restrictive physiology, indicative of decreased left ventricular diastolic compliance and/or increased left atrial pressure. - Aortic valve: There was no stenosis. - Mitral valve: There was moderate to severe central regurgitation, probably functional. - Left atrium: The atrium was severely dilated. - Right ventricle: The cavity size was mildly dilated. Systolic function was mildly reduced. - Right atrium: The atrium was moderately dilated. - Tricuspid valve: There was moderate-severe regurgitation. Visually looks moderate but there is systolic flow reversal in the hepatic vein doppler pattern. Peak RV-RA gradient (S): 25 mm Hg. - Pulmonary arteries: PA peak pressure: 40 mm Hg (S). - Systemic veins: IVC measured 2.25 cm with < 50%  respirophasic variation, suggesting RA pressure 15 mmHg.  Impressions:  - Mildly dilated LV with severe diffuse hypokinesis, EF 20%. Restrictive diastolic function. Mildly dilated RV with mildly decreased systolic function. Biatrial enlargement. Moderate-severe central MR, probably function. Moderate-severe TR. Mild pulmonary hypertension. Dilated IVC suggestive of elevated RV filling presure.  CATH: None    Patient Profile     Daryl [name] is a 58 y.o. male with a hx of asthma and ongoing tobacco abuse with no prior hx of CAD who is being seen today for the evaluation of elevated troponin and new LV dysfunction at the request of Dr. Thedore Mins. Note, pt is also being treated for URI.   Assessment & Plan    1. Cardiomyopathy: new diagnosis. Echo shows severely reduced LVEF at 20% with diffuse hypokinesis and moderate to severe MR. He has evidence of coronary artery calcifications on chest CT and multiple risk factors for CAD. Plan is for [facility] this admission, once renal function improves, for further assessment  and to r/o obstructive CAD. Continue therapy w/  blocker. No ARB currently due to renal function. If renal function improves, may later consider addition of Entresto + spironolactone. Continue to monitor on tele. He has had frequent ventricular ectopy, with bigeminy, trigeminy and had 2 runs of NSVT (7 and 10 beats). If further recurrence, may consider discharging home on [facility] for primary prevention. Plan to d/c home on guidelines directed medical therapy for systolic HF. He will need gradual med titration as renal function, BP and HR allows. This can be done as outpatient. Once on optimal medical therapy, he will need repeat 2D Echo after 3 months. If EF remains < 35%, he will need referral to EP for consideration for ICD.   2. Acute Combined Systolic and Diastolic CHF:  Pt diuresed 1L yesterday. Daily weights are not being checked. I will order. He does not appear  grossly volume overloaded on exam. He is currently w/o need for supplemental O2 requirements and he is able to lay supine w/o orthopnea and PND. O2 sats are stable on RA. He has had a slight increase in SCr from 1.5>>1.6. Recommend holding PM dose of IV Lasix. Plan R/LHC, possibly tomorrow. RHC measurements will help better assess volume status and guide further diuresis. Continue strict I/Os, daily weights, low sodium diet and close monitoring of renal function and electrolytes.   3. Coronary Calcifications: noted on chest CT. Plan for cardiac cath this admission to further evaluate. LDL goal for CVD is <70 mg/dL. He has been started on atorvastatin 40 mg this admit, but we should consider holding given elevated LFTs. Will defer to MD. Will order FLP in the AM for baseline assessment of lipids.   4. Valvular Dysfunction: moderate to severe MR and moderate to severe TR noted on echo. R/LHC pending. He has been diuresed. Continue management of  acute CHF.   5. Elevated Troponin: upward trend 0.04>>0.07>>0.12. 2D echo shows severely reduced LVEF and diffuse hypokinesis. He denies CP but has evidence of coronary calcifications on chest CT. Plan for LHC this admission to assess for obstructive CAD.   6. ARF on CKD, Stage III: baseline SCr ~1.5. SCr has increased over the last 24 hrs at 1.62 today. Appears euvolemic on exam. Recommend holding PM dose of IV Lasix today and repeat BMP in the AM.   7. Elevated LFTs: AST and ALT both elevated at 150 and 201 respectively. Possible cirrhosis on ultrasound. Primary team checking hepatitis panel. He does have a h/o ETOH use. ? If we should temporarily d/c statin. Will defer to MD. Plan is for outpatient GI f/u.   8. Tobacco Abuse: smoking cessation strongly advised.   9. URI/ ? PNA: antibiotics per IM.   10. Right Pleural Effusion: s/p successful ultrasound guided right thoracentesis 1/20, yielding 1 L of pleural fluid. Fluid analysts pending. Pt notes subjective  improvement. SOB resolved.    For questions or updates, please contact [facility] Please consult [email or web address] for contact info under        Signed, Robbie Lis, PA-C  09/14/2018, 8:52 AM    History and all data above reviewed.  Patient examined.  I agree with the findings as above.  He denies SOB or pain.  I asked him if he could give me some details about our long discussion yesterday.  He replied "There is something wrong with my lungs."  . The patient exam reveals COR:RRR  ,  Lungs: Clear  ,  Abd: Positive bowel sounds, no rebound  no guarding, Ext No edema  .  All available labs, radiology testing, previous records reviewed. Agree with documented assessment and plan. Acute systolic HF:  Needs left and right heart cath.  The patient understands that risks included but are not limited to stroke (1 in 1000), death (1 in 1000), kidney failure [usually temporary] (1 in 500), bleeding (1 in 200), allergic reaction [possibly serious] (1 in 200).  The patient understands and agrees to proceed.   OK to do in the AM with no LV gram.  Limit dye.  Continue beta blockers.    Fayrene Fearing Doak Mah  2:10 PM  09/14/2018

## 2018-09-14 NOTE — H&P (View-Only) (Signed)
Progress Note  Patient Name: Daryl [name] Date of Encounter: 09/14/2018  Primary Cardiologist: Rollene Rotunda, MD   Subjective   Pt reports feeling much better today. He noticed the most improvement after thoracentesis. Breathing better. No longer on supplemental O2. Able to lay flat in bed w/o orthopnea and PND. He denies CP.   Inpatient Medications    Scheduled Meds: . ALPRAZolam  0.25 mg Oral Once  . aspirin  81 mg Oral Daily  . atorvastatin  40 mg Oral q1800  . carvedilol  3.125 mg Oral BID WC  . chlordiazePOXIDE  15 mg Oral TID  . doxycycline  100 mg Oral Q12H  . folic acid  1 mg Oral Daily  . furosemide  40 mg Intravenous BID  . heparin injection (subcutaneous)  5,000 Units Subcutaneous Q8H  . isosorbide-hydrALAZINE  1 tablet Oral BID  . LORazepam  0-4 mg Intravenous Q6H   Followed by  . [START ON 09/15/2018] LORazepam  0-4 mg Intravenous Q12H  . multivitamin with minerals  1 tablet Oral Daily  . pneumococcal 23 valent vaccine  0.5 mL Intramuscular Tomorrow-1000  . predniSONE  50 mg Oral Q breakfast  . thiamine  100 mg Oral Daily   Or  . thiamine  100 mg Intravenous Daily   Continuous Infusions: . cefTRIAXone (ROCEPHIN)  IV 2 g (09/13/18 2135)   PRN Meds: albuterol, ipratropium-albuterol, lidocaine, LORazepam **OR** LORazepam   Vital Signs    Vitals:   09/14/18 0051 09/14/18 0459 09/14/18 0552 09/14/18 0846  BP: 100/66 (!) 79/48 106/78 116/86  Pulse: 97 94 94   Resp: 16 (!) 25 16   Temp: 99 F (37.2 C) 98.2 F (36.8 C) 97.7 F (36.5 C)   TempSrc: Oral Oral Oral   SpO2: 98%  94%   Weight:      Height:        Intake/Output Summary (Last 24 hours) at 09/14/2018 0852 Last data filed at 09/13/2018 1315 Gross per 24 hour  Intake -  Output 1050 ml  Net -1050 ml   Last 3 Weights 09/11/2018 01/16/2012  Weight (lbs) 172 lb 2.9 oz 167 lb 6.4 oz  Weight (kg) 78.1 kg 75.932 kg      Telemetry    Currently NSR. HR in the 90s. He had 2 runs of NSVT, (7 beats  and 10 beats), also ventricular bigeminy and trigeminy  - Personally Reviewed  ECG    09/13/18, sinus tach, PVcs - Personally Reviewed  Physical Exam   GEN: middle aged AAM, in no acute distress.   Neck: No JVD Cardiac: RRR, no murmurs, rubs, or gallops.  Respiratory: slightly decreased BS in the RLL, no crackles GI: Soft, nontender, non-distended  MS: No edema; No deformity. Neuro:  Nonfocal  Psych: Normal affect   Labs    Chemistry Recent Labs  Lab 09/12/18 0303 09/13/18 0459 09/14/18 0517  NA 137 139 139  K 4.2 4.7 4.1  CL 108 108 105  CO2 18* 18* 26  GLUCOSE 227* 138* 118*  BUN 11 18 27*  CREATININE 1.51* 1.53* 1.62*  CALCIUM 8.2* 8.7* 8.2*  PROT 5.2* 5.4* 4.6*  ALBUMIN 3.0* 2.9* 2.6*  AST 94* 167* 150*  ALT 137* 193* 201*  ALKPHOS 86 105 95  BILITOT 1.2 0.7 0.5  GFRNONAA 51* 50* 47*  GFRAA 59* 58* 54*  ANIONGAP 11 13 8      Hematology Recent Labs  Lab 09/12/18 0324 09/13/18 0753 09/14/18 0517  WBC 10.9* 22.0* 21.1*  RBC 5.36 5.52 5.00  HGB 15.3 15.8 13.8  HCT 47.3 47.6 42.4  MCV 88.2 86.2 84.8  MCH 28.5 28.6 27.6  MCHC 32.3 33.2 32.5  RDW 13.7 13.5 13.4  PLT 290 298 301    Cardiac Enzymes Recent Labs  Lab 09/11/18 2009 09/12/18 0303 09/12/18 0925 09/12/18 1620  TROPONINI 0.04* 0.04* 0.07* 0.12*   No results for input(s): TROPIPOC in the last 168 hours.   BNP Recent Labs  Lab 09/11/18 2009 09/13/18 0753  BNP 1,771.8* 0,865.71,876.5*     DDimer No results for input(s): DDIMER in the last 168 hours.   Radiology    Dg Chest 1 View  Result Date: 09/13/2018 CLINICAL DATA:  Status post right thoracentesis. EXAM: CHEST  1 VIEW COMPARISON:  09/12/2017. FINDINGS: Stable enlarged cardiac silhouette. The previously seen right pleural fluid is no longer demonstrated. No pneumothorax. Mild peribronchial thickening. Mild scoliosis. IMPRESSION: 1. No pneumothorax following right thoracentesis. 2. Mild bronchitic changes. 3. Stable cardiomegaly.  Electronically Signed   By: Beckie SaltsSteven  Reid M.D.   On: 09/13/2018 14:14   Ct Chest Wo Contrast  Result Date: 09/13/2018 CLINICAL DATA:  Cough.  Follow-up unresolved pneumonia EXAM: CT CHEST WITHOUT CONTRAST TECHNIQUE: Multidetector CT imaging of the chest was performed following the standard protocol without IV contrast. COMPARISON:  Chest x-ray from yesterday FINDINGS: Cardiovascular: Cardiomegaly. Multifocal coronary atherosclerotic calcification. No acute vascular finding without contrast. Mediastinum/Nodes: Negative for adenopathy or mass. Lungs/Pleura: Moderate layering right pleural effusion. Mild atelectasis in the right lower lung. Generalized airway thickening and mild apical emphysema. 3 mm right lower lobe pulmonary nodule at the superior segment. Upper Abdomen: Negative Musculoskeletal: No acute or aggressive finding. Thoracic spondylosis. Gynecomastia IMPRESSION: 1. Moderate layering pleural effusion with atelectasis on the right. 2. Generalized airway thickening. 3. Cardiomegaly and coronary atherosclerosis. 4. 3 mm right lower lobe pulmonary nodule, consider chest CT without contrast in 1 year in this smoker. 5. Mild emphysema. Electronically Signed   By: Marnee SpringJonathon  Watts M.D.   On: 09/13/2018 09:42   [place] Abdomen Complete  Result Date: 09/13/2018 CLINICAL DATA:  Initial evaluation for acute right upper quadrant pain EXAM: ABDOMEN ULTRASOUND COMPLETE COMPARISON:  None. FINDINGS: Gallbladder: No stones or sludge within the gallbladder lumen. Gallbladder wall thickened up to 7 mm, suspected to be related to overall volume status. No free pericholecystic fluid. No sonographic Murphy sign elicited on exam. Common bile duct: Diameter: 6.4 mm. Liver: No focal lesion identified. Diffusely echogenic echotexture within the liver, which can be seen in the setting of steatosis or cirrhosis. No significant hepatic nodularity. Portal vein is patent on color Doppler imaging with normal direction of blood flow  towards the liver. IVC: No abnormality visualized. Pancreas: Not visualized. Spleen: Size and appearance within normal limits. Right Kidney: Length: 12.8 cm. Echogenicity within normal limits. No mass or hydronephrosis visualized. Left Kidney: Length: 8.9 cm. Left kidney poorly visualized, but appears somewhat atrophic. Echogenicity within normal limits. No mass or hydronephrosis visualized. Abdominal aorta: No aneurysm visualized. Other findings: Note made of a right pleural effusion. Small volume ascites seen adjacent to the liver. IMPRESSION: 1. Diffusely increased echogenicity within the hepatic parenchyma, which can be seen in the setting of steatosis or possibly cirrhosis. Correlation with LFTs recommended. 2. Small volume ascites within the upper abdomen. 3. Small right pleural effusion. 4. Mild gallbladder wall thickening, suspected to be related to ascites and/or volume status. No cholelithiasis or additional sonographic features to suggest acute cholecystitis. No biliary dilatation. 5. Mildly small  and atrophic left kidney. Electronically Signed   By: Rise Mu M.D.   On: 09/13/2018 04:53   Dg Chest Port 1 View  Result Date: 09/12/2018 CLINICAL DATA:  Short of breath, labored breathing EXAM: PORTABLE CHEST 1 VIEW COMPARISON:  09/11/18 FINDINGS: Stable enlarged cardiac silhouette. Increase in RIGHT pleural effusion. Potential RIGHT lower lobe airspace disease. Upper lungs are clear. IMPRESSION: 1. Increased RIGHT effusion.  Potential parapneumonic effusion. 2. Cardiomegaly Electronically Signed   By: Genevive Bi M.D.   On: 09/12/2018 23:04   Ir Thoracentesis Asp Pleural Space W/img Guide  Result Date: 09/13/2018 INDICATION: Shortness of breath. Right-sided pleural effusion. Request for diagnostic and therapeutic thoracentesis. EXAM: ULTRASOUND GUIDED RIGHT THORACENTESIS MEDICATIONS: None. COMPLICATIONS: None immediate. PROCEDURE: An ultrasound guided thoracentesis was thoroughly  discussed with the patient and questions answered. The benefits, risks, alternatives and complications were also discussed. The patient understands and wishes to proceed with the procedure. Written consent was obtained. Ultrasound was performed to localize and mark an adequate pocket of fluid in the right chest. The area was then prepped and draped in the normal sterile fashion. 1% Lidocaine was used for local anesthesia. Under ultrasound guidance a 6 Fr Safe-T-Centesis catheter was introduced. Thoracentesis was performed. The catheter was removed and a dressing applied. FINDINGS: A total of approximately 1 L of slightly hazy yellow fluid was removed. Samples were sent to the laboratory as requested by the clinical team. IMPRESSION: Successful ultrasound guided right thoracentesis yielding 1 L of pleural fluid. Read by: Brayton El PA-C Electronically Signed   By: Richarda Overlie M.D.   On: 09/13/2018 14:15    Cardiac Studies   ECHO: 09/13/18:  Study Conclusions  - Left ventricle: The cavity size was mildly dilated. Wall thickness was normal. The estimated ejection fraction was 20%. Diffuse hypokinesis. Doppler parameters are consistent with restrictive physiology, indicative of decreased left ventricular diastolic compliance and/or increased left atrial pressure. - Aortic valve: There was no stenosis. - Mitral valve: There was moderate to severe central regurgitation, probably functional. - Left atrium: The atrium was severely dilated. - Right ventricle: The cavity size was mildly dilated. Systolic function was mildly reduced. - Right atrium: The atrium was moderately dilated. - Tricuspid valve: There was moderate-severe regurgitation. Visually looks moderate but there is systolic flow reversal in the hepatic vein doppler pattern. Peak RV-RA gradient (S): 25 mm Hg. - Pulmonary arteries: PA peak pressure: 40 mm Hg (S). - Systemic veins: IVC measured 2.25 cm with < 50%  respirophasic variation, suggesting RA pressure 15 mmHg.  Impressions:  - Mildly dilated LV with severe diffuse hypokinesis, EF 20%. Restrictive diastolic function. Mildly dilated RV with mildly decreased systolic function. Biatrial enlargement. Moderate-severe central MR, probably function. Moderate-severe TR. Mild pulmonary hypertension. Dilated IVC suggestive of elevated RV filling presure.  CATH: None    Patient Profile     Daryl [name] is a 58 y.o. male with a hx of asthma and ongoing tobacco abuse with no prior hx of CAD who is being seen today for the evaluation of elevated troponin and new LV dysfunction at the request of Dr. Thedore Mins. Note, pt is also being treated for URI.   Assessment & Plan    1. Cardiomyopathy: new diagnosis. Echo shows severely reduced LVEF at 20% with diffuse hypokinesis and moderate to severe MR. He has evidence of coronary artery calcifications on chest CT and multiple risk factors for CAD. Plan is for [facility] this admission, once renal function improves, for further assessment  and to r/o obstructive CAD. Continue therapy w/  blocker. No ARB currently due to renal function. If renal function improves, may later consider addition of Entresto + spironolactone. Continue to monitor on tele. He has had frequent ventricular ectopy, with bigeminy, trigeminy and had 2 runs of NSVT (7 and 10 beats). If further recurrence, may consider discharging home on [facility] for primary prevention. Plan to d/c home on guidelines directed medical therapy for systolic HF. He will need gradual med titration as renal function, BP and HR allows. This can be done as outpatient. Once on optimal medical therapy, he will need repeat 2D Echo after 3 months. If EF remains < 35%, he will need referral to EP for consideration for ICD.   2. Acute Combined Systolic and Diastolic CHF:  Pt diuresed 1L yesterday. Daily weights are not being checked. I will order. He does not appear  grossly volume overloaded on exam. He is currently w/o need for supplemental O2 requirements and he is able to lay supine w/o orthopnea and PND. O2 sats are stable on RA. He has had a slight increase in SCr from 1.5>>1.6. Recommend holding PM dose of IV Lasix. Plan R/LHC, possibly tomorrow. RHC measurements will help better assess volume status and guide further diuresis. Continue strict I/Os, daily weights, low sodium diet and close monitoring of renal function and electrolytes.   3. Coronary Calcifications: noted on chest CT. Plan for cardiac cath this admission to further evaluate. LDL goal for CVD is <70 mg/dL. He has been started on atorvastatin 40 mg this admit, but we should consider holding given elevated LFTs. Will defer to MD. Will order FLP in the AM for baseline assessment of lipids.   4. Valvular Dysfunction: moderate to severe MR and moderate to severe TR noted on echo. R/LHC pending. He has been diuresed. Continue management of  acute CHF.   5. Elevated Troponin: upward trend 0.04>>0.07>>0.12. 2D echo shows severely reduced LVEF and diffuse hypokinesis. He denies CP but has evidence of coronary calcifications on chest CT. Plan for LHC this admission to assess for obstructive CAD.   6. ARF on CKD, Stage III: baseline SCr ~1.5. SCr has increased over the last 24 hrs at 1.62 today. Appears euvolemic on exam. Recommend holding PM dose of IV Lasix today and repeat BMP in the AM.   7. Elevated LFTs: AST and ALT both elevated at 150 and 201 respectively. Possible cirrhosis on ultrasound. Primary team checking hepatitis panel. He does have a h/o ETOH use. ? If we should temporarily d/c statin. Will defer to MD. Plan is for outpatient GI f/u.   8. Tobacco Abuse: smoking cessation strongly advised.   9. URI/ ? PNA: antibiotics per IM.   10. Right Pleural Effusion: s/p successful ultrasound guided right thoracentesis 1/20, yielding 1 L of pleural fluid. Fluid analysts pending. Pt notes subjective  improvement. SOB resolved.    For questions or updates, please contact [facility] Please consult [email or web address] for contact info under        Signed, Robbie Lis, PA-C  09/14/2018, 8:52 AM    History and all data above reviewed.  Patient examined.  I agree with the findings as above.  He denies SOB or pain.  I asked him if he could give me some details about our long discussion yesterday.  He replied "There is something wrong with my lungs."  . The patient exam reveals COR:RRR  ,  Lungs: Clear  ,  Abd: Positive bowel sounds, no rebound  no guarding, Ext No edema  .  All available labs, radiology testing, previous records reviewed. Agree with documented assessment and plan. Acute systolic HF:  Needs left and right heart cath.  The patient understands that risks included but are not limited to stroke (1 in 1000), death (1 in 1000), kidney failure [usually temporary] (1 in 500), bleeding (1 in 200), allergic reaction [possibly serious] (1 in 200).  The patient understands and agrees to proceed.   OK to do in the AM with no LV gram.  Limit dye.  Continue beta blockers.    Fayrene Fearing Hanz Winterhalter  2:10 PM  09/14/2018

## 2018-09-14 NOTE — Progress Notes (Addendum)
PROGRESS NOTE                                                                                                                                                                                                             Patient Demographics:    Daryl [name], is a 58 y.o. male, DOB - [birth year], WUJ:[id number]  Admit date - 09/11/2018   Admitting Physician Haydee Monica, MD  Outpatient Primary MD for the patient is Patient, No Pcp Per  LOS - 3  Chief Complaint  Patient presents with  . Shortness of Breath       Brief Narrative  Daryl [name] is a 58 y.o. male with medical history significant of asthma, tobacco abuse comes in with an illness that is been going on for 2 weeks but progressive worsening coughing shortness of breath fevers and chills at home.  Patient reports he has been having a productive cough.   Subjective:    Daryl [name] today has, No headache, No chest pain, No abdominal pain - No Nausea, No new weakness tingling or numbness, No Cough - +ve SOB.     Assessment  & Plan :     1. Sepsis with acute hypoxic respiratory failure in the setting of rhinovirus infection with superimposed bacterial infection.  He had significant hypoxia, continue IV antibiotics, clinically much improved, likely significant component of CHF.  Cultures negative so far.  2.  Flipped lateral T waves, mildly elevated troponin, elevated BNP and frequent V. tach runs.  He is asymptomatic from a V. tach standpoint  -echocardiogram checked showing severely depressed EF of 20% along with diffuse hypokinesis, placed on aspirin, statin along with Coreg.  Cardiology on board and contemplating left heart cath.  Continue monitoring.  Lipid Panel pending for now.  3.  Right-sided 4.3 mm lung nodule and free-flowing right-sided pleural effusion.  Did not have any pleuritic chest pain hence I do not think this was parapneumonic effusion or empyema, ultrasound-guided thoracentesis on the right side was  requested by IR on 09/13/2018, 1 L of transudative fluid removed, pathology pending.  4.  Elevated LFTs.  Possible cirrhosis on ultrasound.  Negative hepatitis panel, have history of heavy alcohol use, continue supportive care and monitor trend on statin.  5.  ARF on CKD 3.  Baseline creatinine around 1.5.  Status close to euvolemia, will cut down on diuretics and monitor.  6.  Smoking history and alcohol use.  Counseled to quit.  Placed on Librium along  with CIWA protocol.  7. 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      Family Communication  :  None  Code Status :  Full  Disposition Plan  :  TBD  Consults  :  Cards  Procedures  :    TTE - - Mildly dilated LV with severe diffuse hypokinesis, EF 20%. Restrictive diastolic function. Mildly dilated RV with mildly decreased systolic function. Biatrial enlargement. Moderate-severe central MR, probably function. Moderate-severe TR. Mild pulmonary hypertension. Dilated IVC suggestive of elevated RV filling presure.  CT chest - 1. Moderate layering pleural effusion with atelectasis on the right. 2. Generalized airway thickening. 3. Cardiomegaly and coronary atherosclerosis. 4. 3 mm right lower lobe pulmonary nodule, consider chest CT without contrast in 1 year in this smoker. 5. Mild emphysema.   RUQ [place] - mild ascites with ?  Cirrhosis  US guided R. Thoracentesis by IR 09/13/18 - 1 lit transudative fluid    DVT Prophylaxis  :    Heparin started 09/13/2018  Lab Results  Component Value Date   PLT 301 09/14/2018    Diet :  Diet Order            Diet 2 gram sodium Room service appropriate? Yes; Fluid consistency: Thin  Diet effective now               Inpatient Medications Scheduled Meds: . ALPRAZolam  0.25 mg Oral Once  . aspirin  81 mg Oral Daily  . atorvastatin  40 mg Oral q1800  . carvedilol  3.125 mg Oral BID WC  . chlordiazePOXIDE  15 mg Oral TID  . doxycycline  100  mg Oral Q12H  . folic acid  1 mg Oral Daily  . furosemide  40 mg Intravenous BID  . heparin injection (subcutaneous)  5,000 Units Subcutaneous Q8H  . isosorbide-hydrALAZINE  1 tablet Oral BID  . LORazepam  0-4 mg Intravenous Q6H   Followed by  . [START ON 09/15/2018] LORazepam  0-4 mg Intravenous Q12H  . multivitamin with minerals  1 tablet Oral Daily  . pneumococcal 23 valent vaccine  0.5 mL Intramuscular Tomorrow-1000  . predniSONE  50 mg Oral Q breakfast  . thiamine  100 mg Oral Daily   Or  . thiamine  100 mg Intravenous Daily   Continuous Infusions: . cefTRIAXone (ROCEPHIN)  IV 2 g (09/13/18 2135)   PRN Meds:.albuterol, ipratropium-albuterol, lidocaine, LORazepam **OR** LORazepam  Antibiotics  :   Anti-infectives (From admission, onward)   Start     Dose/Rate Route Frequency Ordered Stop   09/13/18 1000  doxycycline (VIBRA-TABS) tablet 100 mg     100 mg Oral Every 12 hours 09/13/18 0735     09/11/18 2045  cefTRIAXone (ROCEPHIN) 2 g in sodium chloride 0.9 % 100 mL IVPB     2 g 200 mL/hr over 30 Minutes Intravenous Every 24 hours 09/11/18 2030     09/11/18 2045  azithromycin (ZITHROMAX) 500 mg in sodium chloride 0.9 % 250 mL IVPB  Status:  Discontinued     500 mg 250 mL/hr over 60 Minutes Intravenous Every 24 hours 09/11/18 2030 09/13/18 0735          Objective:   Vitals:   09/14/18 0051 09/14/18 0459 09/14/18 0552 09/14/18 0846  BP: 100/66 (!) 79/48 106/78 116/86  Pulse: 97 94 94   Resp: 16 (!) 25 16   Temp: 99 F (37.2 C) 98.2  F (36.8 C) 97.7 F (36.5 C)   TempSrc: Oral Oral Oral   SpO2: 98%  94%   Weight:      Height:        Wt Readings from Last 3 Encounters:  09/11/18 78.1 kg  01/16/12 75.9 kg     Intake/Output Summary (Last 24 hours) at 09/14/2018 1041 Last data filed at 09/13/2018 1315 Gross per 24 hour  Intake -  Output 1050 ml  Net -1050 ml     Physical Exam  Awake Alert, Oriented X 3, No new F.N deficits, Normal  affect [place].AT,PERRAL Supple Neck,No JVD, No cervical lymphadenopathy appriciated.  Symmetrical Chest wall movement, Good air movement bilaterally, CTAB RRR,No Gallops, Rubs or new Murmurs, No Parasternal Heave +ve B.Sounds, Abd Soft, No tenderness, No organomegaly appriciated, No rebound - guarding or rigidity. No Cyanosis, Clubbing or edema, No new Rash or bruise    Data Review:    CBC Recent Labs  Lab 09/11/18 2009 09/12/18 0324 09/13/18 0753 09/14/18 0517  WBC 8.7 10.9* 22.0* 21.1*  HGB 16.5 15.3 15.8 13.8  HCT 53.6* 47.3 47.6 42.4  PLT 313 290 298 301  MCV 89.6 88.2 86.2 84.8  MCH 27.6 28.5 28.6 27.6  MCHC 30.8 32.3 33.2 32.5  RDW 13.8 13.7 13.5 13.4  LYMPHSABS 1.8 0.6*  --   --   MONOABS 0.8 0.2  --   --   EOSABS 0.0 0.1  --   --   BASOSABS 0.0 0.0  --   --     Chemistries  Recent Labs  Lab 09/11/18 2009 09/12/18 0303 09/13/18 0459 09/13/18 0753 09/14/18 0517  NA 141 137 139  --  139  K 3.9 4.2 4.7  --  4.1  CL 106 108 108  --  105  CO2 23 18* 18*  --  26  GLUCOSE 118* 227* 138*  --  118*  BUN 13 11 18   --  27*  CREATININE 1.65* 1.51* 1.53*  --  1.62*  CALCIUM 8.8* 8.2* 8.7*  --  8.2*  MG  --   --   --  1.9  --   AST 104* 94* 167*  --  150*  ALT 169* 137* 193*  --  201*  ALKPHOS 106 86 105  --  95  BILITOT 1.7* 1.2 0.7  --  0.5   ------------------------------------------------------------------------------------------------------------------ No results for input(s): CHOL, HDL, LDLCALC, TRIG, CHOLHDL, LDLDIRECT in the last 72 hours.  No results found for: HGBA1C ------------------------------------------------------------------------------------------------------------------ No results for input(s): TSH, T4TOTAL, T3FREE, THYROIDAB in the last 72 hours.  Invalid input(s): FREET3 ------------------------------------------------------------------------------------------------------------------ No results for input(s): VITAMINB12, FOLATE, FERRITIN,  TIBC, IRON, RETICCTPCT in the last 72 hours.  Coagulation profile No results for input(s): INR, PROTIME in the last 168 hours.  No results for input(s): DDIMER in the last 72 hours.  Cardiac Enzymes Recent Labs  Lab 09/12/18 0303 09/12/18 0925 09/12/18 1620  TROPONINI 0.04* 0.07* 0.12*   ------------------------------------------------------------------------------------------------------------------    Component Value Date/Time   BNP 1,876.5 (H) 09/13/2018 0753    Micro Results Recent Results (from the past 240 hour(s))  Culture, sputum-assessment     Status: None   Collection Time: 09/11/18  4:13 AM  Result Value Ref Range Status   Specimen Description EXPECTORATED SPUTUM  Final   Special Requests NONE  Final   Sputum evaluation   Final    FEW WBC PRESENT, PREDOMINANTLY PMN MODERATE GRAM POSITIVE COCCI IN PAIRS MODERATE GRAM NEGATIVE RODS FEW GRAM POSITIVE  RODS THIS SPECIMEN IS ACCEPTABLE FOR SPUTUM CULTURE Performed at [facility], [address] [place], [place] [address]    Report Status 09/13/2018 FINAL  Final  Culture, respiratory     Status: None (Preliminary result)   Collection Time: 09/11/18  4:13 AM  Result Value Ref Range Status   Specimen Description EXPECTORATED SPUTUM  Final   Special Requests NONE Reflexed from [id number]  Final   Gram Stain   Final    FEW WBC PRESENT, PREDOMINANTLY PMN MODERATE GRAM POSITIVE COCCI IN PAIRS MODERATE GRAM NEGATIVE RODS FEW GRAM POSITIVE RODS Performed at [facility], [address] [place], [place] [address]    Culture FEW Consistent with normal respiratory flora.  Final   Report Status PENDING  Incomplete  Blood Culture (routine x 2)     Status: None (Preliminary result)   Collection Time: 09/11/18  8:53 PM  Result Value Ref Range Status   Specimen Description BLOOD LEFT HAND  Final   Special Requests   Final    BOTTLES DRAWN AEROBIC AND ANAEROBIC Blood Culture adequate volume   Culture   Final     NO GROWTH 2 DAYS Performed at [facility], [address] [place], [place] [address]    Report Status PENDING  Incomplete  Blood Culture (routine x 2)     Status: None (Preliminary result)   Collection Time: 09/11/18  9:00 PM  Result Value Ref Range Status   Specimen Description BLOOD RIGHT HAND  Final   Special Requests   Final    BOTTLES DRAWN AEROBIC AND ANAEROBIC Blood Culture adequate volume   Culture   Final    NO GROWTH 2 DAYS Performed at [facility], [address] [place], [place] [address]    Report Status PENDING  Incomplete  Respiratory Panel by PCR     Status: Abnormal   Collection Time: 09/11/18 11:54 PM  Result Value Ref Range Status   Adenovirus NOT DETECTED NOT DETECTED Final   Coronavirus 229E NOT DETECTED NOT DETECTED Final   Coronavirus HKU1 NOT DETECTED NOT DETECTED Final   Coronavirus NL63 NOT DETECTED NOT DETECTED Final   Coronavirus OC43 NOT DETECTED NOT DETECTED Final   Metapneumovirus NOT DETECTED NOT DETECTED Final   Rhinovirus / Enterovirus DETECTED (A) NOT DETECTED Final   Influenza A NOT DETECTED NOT DETECTED Final   Influenza B NOT DETECTED NOT DETECTED Final   Parainfluenza Virus 1 NOT DETECTED NOT DETECTED Final   Parainfluenza Virus 2 NOT DETECTED NOT DETECTED Final   Parainfluenza Virus 3 NOT DETECTED NOT DETECTED Final   Parainfluenza Virus 4 NOT DETECTED NOT DETECTED Final   Respiratory Syncytial Virus NOT DETECTED NOT DETECTED Final   Bordetella pertussis NOT DETECTED NOT DETECTED Final   Chlamydophila pneumoniae NOT DETECTED NOT DETECTED Final   Mycoplasma pneumoniae NOT DETECTED NOT DETECTED Final  Gram stain     Status: None   Collection Time: 09/13/18 11:01 AM  Result Value Ref Range Status   Specimen Description PLEURAL  Final   Special Requests   Final    Normal Performed at [facility], [address], [place], [place] [address]    Gram Stain   Final    WBC PRESENT, PREDOMINANTLY PMN NO ORGANISMS  SEEN CYTOSPIN SMEAR    Report Status 09/13/2018 FINAL  Final    Radiology Reports Dg Chest 1 View  Result Date: 09/13/2018 CLINICAL DATA:  Status post right thoracentesis. EXAM:  CHEST  1 VIEW COMPARISON:  09/12/2017. FINDINGS: Stable enlarged cardiac silhouette. The previously seen right pleural fluid is no longer demonstrated. No pneumothorax. Mild peribronchial thickening. Mild scoliosis. IMPRESSION: 1. No pneumothorax following right thoracentesis. 2. Mild bronchitic changes. 3. Stable cardiomegaly. Electronically Signed   By: Beckie SaltsSteven  Reid M.D.   On: 09/13/2018 14:14   Dg Chest 2 View  Result Date: 09/11/2018 CLINICAL DATA:  Short of breath. Cough and fever for 2 weeks. Hypertension. Smoker. EXAM: CHEST - 2 VIEW COMPARISON:  01/19/2007 FINDINGS: Lateral view degraded by patient arm position. Midline trachea. Borderline cardiomegaly. No pleural fluid. Pulmonary interstitial thickening. Right greater than left base airspace disease. The right base airspace disease is favored to be centered in the right middle lobe. IMPRESSION: Right greater than left base airspace disease. The right middle lobe opacity is suspicious for pneumonia. Left lower lobe more patchy may may could represent atelectasis. Borderline cardiomegaly with mild pulmonary interstitial thickening. Suspicious for pulmonary venous congestion. Electronically Signed   By: Jeronimo GreavesKyle  Talbot M.D.   On: 09/11/2018 20:19   Ct Chest Wo Contrast  Result Date: 09/13/2018 CLINICAL DATA:  Cough.  Follow-up unresolved pneumonia EXAM: CT CHEST WITHOUT CONTRAST TECHNIQUE: Multidetector CT imaging of the chest was performed following the standard protocol without IV contrast. COMPARISON:  Chest x-ray from yesterday FINDINGS: Cardiovascular: Cardiomegaly. Multifocal coronary atherosclerotic calcification. No acute vascular finding without contrast. Mediastinum/Nodes: Negative for adenopathy or mass. Lungs/Pleura: Moderate layering right pleural effusion.  Mild atelectasis in the right lower lung. Generalized airway thickening and mild apical emphysema. 3 mm right lower lobe pulmonary nodule at the superior segment. Upper Abdomen: Negative Musculoskeletal: No acute or aggressive finding. Thoracic spondylosis. Gynecomastia IMPRESSION: 1. Moderate layering pleural effusion with atelectasis on the right. 2. Generalized airway thickening. 3. Cardiomegaly and coronary atherosclerosis. 4. 3 mm right lower lobe pulmonary nodule, consider chest CT without contrast in 1 year in this smoker. 5. Mild emphysema. Electronically Signed   By: Marnee SpringJonathon  Watts M.D.   On: 09/13/2018 09:42   [place] Abdomen Complete  Result Date: 09/13/2018 CLINICAL DATA:  Initial evaluation for acute right upper quadrant pain EXAM: ABDOMEN ULTRASOUND COMPLETE COMPARISON:  None. FINDINGS: Gallbladder: No stones or sludge within the gallbladder lumen. Gallbladder wall thickened up to 7 mm, suspected to be related to overall volume status. No free pericholecystic fluid. No sonographic Murphy sign elicited on exam. Common bile duct: Diameter: 6.4 mm. Liver: No focal lesion identified. Diffusely echogenic echotexture within the liver, which can be seen in the setting of steatosis or cirrhosis. No significant hepatic nodularity. Portal vein is patent on color Doppler imaging with normal direction of blood flow towards the liver. IVC: No abnormality visualized. Pancreas: Not visualized. Spleen: Size and appearance within normal limits. Right Kidney: Length: 12.8 cm. Echogenicity within normal limits. No mass or hydronephrosis visualized. Left Kidney: Length: 8.9 cm. Left kidney poorly visualized, but appears somewhat atrophic. Echogenicity within normal limits. No mass or hydronephrosis visualized. Abdominal aorta: No aneurysm visualized. Other findings: Note made of a right pleural effusion. Small volume ascites seen adjacent to the liver. IMPRESSION: 1. Diffusely increased echogenicity within the hepatic  parenchyma, which can be seen in the setting of steatosis or possibly cirrhosis. Correlation with LFTs recommended. 2. Small volume ascites within the upper abdomen. 3. Small right pleural effusion. 4. Mild gallbladder wall thickening, suspected to be related to ascites and/or volume status. No cholelithiasis or additional sonographic features to suggest acute cholecystitis. No biliary dilatation. 5. Mildly small and  atrophic left kidney. Electronically Signed   By: Rise Mu M.D.   On: 09/13/2018 04:53   Dg Chest Port 1 View  Result Date: 09/12/2018 CLINICAL DATA:  Short of breath, labored breathing EXAM: PORTABLE CHEST 1 VIEW COMPARISON:  09/11/18 FINDINGS: Stable enlarged cardiac silhouette. Increase in RIGHT pleural effusion. Potential RIGHT lower lobe airspace disease. Upper lungs are clear. IMPRESSION: 1. Increased RIGHT effusion.  Potential parapneumonic effusion. 2. Cardiomegaly Electronically Signed   By: Genevive Bi M.D.   On: 09/12/2018 23:04   Ir Thoracentesis Asp Pleural Space W/img Guide  Result Date: 09/13/2018 INDICATION: Shortness of breath. Right-sided pleural effusion. Request for diagnostic and therapeutic thoracentesis. EXAM: ULTRASOUND GUIDED RIGHT THORACENTESIS MEDICATIONS: None. COMPLICATIONS: None immediate. PROCEDURE: An ultrasound guided thoracentesis was thoroughly discussed with the patient and questions answered. The benefits, risks, alternatives and complications were also discussed. The patient understands and wishes to proceed with the procedure. Written consent was obtained. Ultrasound was performed to localize and mark an adequate pocket of fluid in the right chest. The area was then prepped and draped in the normal sterile fashion. 1% Lidocaine was used for local anesthesia. Under ultrasound guidance a 6 Fr Safe-T-Centesis catheter was introduced. Thoracentesis was performed. The catheter was removed and a dressing applied. FINDINGS: A total of approximately  1 L of slightly hazy yellow fluid was removed. Samples were sent to the laboratory as requested by the clinical team. IMPRESSION: Successful ultrasound guided right thoracentesis yielding 1 L of pleural fluid. Read by: Brayton El PA-C Electronically Signed   By: Richarda Overlie M.D.   On: 09/13/2018 14:15    Time Spent in minutes  30   Susa Raring M.D on 09/14/2018 at 10:41 AM  To page go to [email or web address] - password [facility]

## 2018-09-14 NOTE — Progress Notes (Signed)
Nutrition Brief Note  RD consulted for nutrition education regarding new onset CHF.  RD provided pt "Low Sodium Nutrition Therapy" handout from the [employer]. Left handout on bedside tray.  Pt requested RD return later when "I can pay attention." RD returned x 2. Pt continued to ask RD to return later then said that he would not like education.  Please re-consult RD if pt has questions about handout or is willing to participate in education.   Earma Reading, MS, RD, LDN Inpatient Clinical Dietitian Pager: [phone] Weekend/After Hours: [phone]

## 2018-09-15 ENCOUNTER — Inpatient Hospital Stay (HOSPITAL_COMMUNITY): Payer: Medicaid Other

## 2018-09-15 ENCOUNTER — Encounter (HOSPITAL_COMMUNITY): Payer: Self-pay | Admitting: Cardiology

## 2018-09-15 ENCOUNTER — Encounter (HOSPITAL_COMMUNITY)
Admission: EM | Disposition: A | Discharge status: Home / Self Care | Payer: Self-pay | Source: Home / Self Care | Attending: Internal Medicine

## 2018-09-15 DIAGNOSIS — I472 Ventricular tachycardia: Secondary | ICD-10-CM

## 2018-09-15 DIAGNOSIS — I5041 Acute combined systolic (congestive) and diastolic (congestive) heart failure: Secondary | ICD-10-CM

## 2018-09-15 DIAGNOSIS — I420 Dilated cardiomyopathy: Secondary | ICD-10-CM

## 2018-09-15 DIAGNOSIS — I4729 Other ventricular tachycardia: Secondary | ICD-10-CM

## 2018-09-15 DIAGNOSIS — I428 Other cardiomyopathies: Secondary | ICD-10-CM

## 2018-09-15 HISTORY — PX: RIGHT/LEFT HEART CATH AND CORONARY ANGIOGRAPHY: CATH118266

## 2018-09-15 LAB — POCT I-STAT EG7
Acid-Base Excess: 1 mmol/L (ref 0.0–2.0)
Acid-Base Excess: 2 mmol/L (ref 0.0–2.0)
Bicarbonate: 28 mmol/L (ref 20.0–28.0)
Bicarbonate: 28.8 mmol/L — ABNORMAL HIGH (ref 20.0–28.0)
Calcium, Ion: 1.18 mmol/L (ref 1.15–1.40)
Calcium, Ion: 1.23 mmol/L (ref 1.15–1.40)
HCT: 46 % (ref 39.0–52.0)
HEMATOCRIT: 48 % (ref 39.0–52.0)
HEMOGLOBIN: 15.6 g/dL (ref 13.0–17.0)
Hemoglobin: 16.3 g/dL (ref 13.0–17.0)
O2 Saturation: 60 %
O2 Saturation: 64 %
POTASSIUM: 3.8 mmol/L (ref 3.5–5.1)
POTASSIUM: 3.9 mmol/L (ref 3.5–5.1)
SODIUM: 148 mmol/L — AB (ref 135–145)
Sodium: 144 mmol/L (ref 135–145)
TCO2: 30 mmol/L (ref 22–32)
TCO2: 30 mmol/L (ref 22–32)
pCO2, Ven: 50.2 mmHg (ref 44.0–60.0)
pCO2, Ven: 50.2 mmHg (ref 44.0–60.0)
pH, Ven: 7.355 (ref 7.250–7.430)
pH, Ven: 7.367 (ref 7.250–7.430)
pO2, Ven: 33 mmHg (ref 32.0–45.0)
pO2, Ven: 35 mmHg (ref 32.0–45.0)

## 2018-09-15 LAB — COMPREHENSIVE METABOLIC PANEL
ALT: 186 U/L — ABNORMAL HIGH (ref 0–44)
AST: 106 U/L — ABNORMAL HIGH (ref 15–41)
Albumin: 2.6 g/dL — ABNORMAL LOW (ref 3.5–5.0)
Alkaline Phosphatase: 88 U/L (ref 38–126)
Anion gap: 7 (ref 5–15)
BUN: 18 mg/dL (ref 6–20)
CALCIUM: 8.2 mg/dL — AB (ref 8.9–10.3)
CO2: 26 mmol/L (ref 22–32)
Chloride: 109 mmol/L (ref 98–111)
Creatinine, Ser: 1.4 mg/dL — ABNORMAL HIGH (ref 0.61–1.24)
GFR calc Af Amer: >60 mL/min (ref >60)
GFR, EST NON AFRICAN AMERICAN: 56 mL/min — AB (ref >60)
Glucose, Bld: 111 mg/dL — ABNORMAL HIGH (ref 70–99)
Potassium: 3.8 mmol/L (ref 3.5–5.1)
Sodium: 142 mmol/L (ref 135–145)
Total Bilirubin: 1 mg/dL (ref 0.3–1.2)
Total Protein: 4.6 g/dL — ABNORMAL LOW (ref 6.5–8.1)

## 2018-09-15 LAB — LIPID PANEL
Cholesterol: 127 mg/dL (ref 0–200)
HDL: 33 mg/dL — ABNORMAL LOW (ref >40)
LDL Cholesterol: 69 mg/dL (ref 0–99)
Total CHOL/HDL Ratio: 3.8 RATIO
Triglycerides: 124 mg/dL (ref <150)
VLDL: 25 mg/dL (ref 0–40)

## 2018-09-15 LAB — POCT I-STAT 7, (LYTES, BLD GAS, ICA,H+H)
Acid-Base Excess: 1 mmol/L (ref 0.0–2.0)
Bicarbonate: 26.7 mmol/L (ref 20.0–28.0)
Calcium, Ion: 1.11 mmol/L — ABNORMAL LOW (ref 1.15–1.40)
HCT: 45 % (ref 39.0–52.0)
Hemoglobin: 15.3 g/dL (ref 13.0–17.0)
O2 SAT: 93 %
Potassium: 3.6 mmol/L (ref 3.5–5.1)
Sodium: 144 mmol/L (ref 135–145)
TCO2: 28 mmol/L (ref 22–32)
pCO2 arterial: 44.8 mmHg (ref 32.0–48.0)
pH, Arterial: 7.383 (ref 7.350–7.450)
pO2, Arterial: 70 mmHg — ABNORMAL LOW (ref 83.0–108.0)

## 2018-09-15 LAB — CBC
HCT: 44.3 % (ref 39.0–52.0)
Hemoglobin: 14.2 g/dL (ref 13.0–17.0)
MCH: 27.9 pg (ref 26.0–34.0)
MCHC: 32.1 g/dL (ref 30.0–36.0)
MCV: 87 fL (ref 80.0–100.0)
Platelets: 324 10*3/uL (ref 150–400)
RBC: 5.09 MIL/uL (ref 4.22–5.81)
RDW: 13.6 % (ref 11.5–15.5)
WBC: 18.3 10*3/uL — ABNORMAL HIGH (ref 4.0–10.5)
nRBC: 0 % (ref 0.0–0.2)

## 2018-09-15 LAB — CULTURE, RESPIRATORY W GRAM STAIN: Culture: NORMAL

## 2018-09-15 SURGERY — RIGHT/LEFT HEART CATH AND CORONARY ANGIOGRAPHY
Anesthesia: LOCAL

## 2018-09-15 MED ORDER — HEPARIN (PORCINE) IN NACL 1000-0.9 UT/500ML-% IV SOLN
INTRAVENOUS | Status: DC | PRN
  Administered 2018-09-15 (×2): 500 mL

## 2018-09-15 MED ORDER — NITROGLYCERIN 1 MG/10 ML FOR IR/CATH LAB
INTRA_ARTERIAL | Status: AC
  Filled 2018-09-15: qty 10

## 2018-09-15 MED ORDER — MIDAZOLAM HCL 2 MG/2ML IJ SOLN
INTRAMUSCULAR | Status: AC
  Filled 2018-09-15: qty 2

## 2018-09-15 MED ORDER — ACETAMINOPHEN 325 MG PO TABS: 650.0000 mg | ORAL_TABLET | ORAL | Status: DC | PRN

## 2018-09-15 MED ORDER — ASPIRIN 81 MG PO CHEW
81.0000 mg | CHEWABLE_TABLET | ORAL | Status: AC
  Administered 2018-09-15: 81 mg via ORAL
  Filled 2018-09-15: qty 1

## 2018-09-15 MED ORDER — LIDOCAINE HCL (PF) 1 % IJ SOLN
INTRAMUSCULAR | Status: DC | PRN
  Administered 2018-09-15: 5 mL

## 2018-09-15 MED ORDER — IOHEXOL 350 MG/ML SOLN
INTRAVENOUS | Status: DC | PRN
  Administered 2018-09-15: 55 mL via INTRACARDIAC

## 2018-09-15 MED ORDER — MAGNESIUM SULFATE IN D5W 1-5 GM/100ML-% IV SOLN
1.0000 g | Freq: Once | INTRAVENOUS | Status: AC
  Administered 2018-09-15: 1 g via INTRAVENOUS
  Filled 2018-09-15: qty 100

## 2018-09-15 MED ORDER — HEPARIN (PORCINE) IN NACL 1000-0.9 UT/500ML-% IV SOLN
INTRAVENOUS | Status: AC
  Filled 2018-09-15: qty 1000

## 2018-09-15 MED ORDER — MIDAZOLAM HCL 2 MG/2ML IJ SOLN
INTRAMUSCULAR | Status: DC | PRN
  Administered 2018-09-15 (×2): 1 mg via INTRAVENOUS

## 2018-09-15 MED ORDER — FENTANYL CITRATE (PF) 100 MCG/2ML IJ SOLN
INTRAMUSCULAR | Status: AC
  Filled 2018-09-15: qty 2

## 2018-09-15 MED ORDER — SODIUM CHLORIDE 0.9 % IV SOLN
INTRAVENOUS | Status: DC
  Administered 2018-09-15: 10:00:00 via INTRAVENOUS

## 2018-09-15 MED ORDER — LIDOCAINE HCL (PF) 1 % IJ SOLN
INTRAMUSCULAR | Status: AC
  Filled 2018-09-15: qty 30

## 2018-09-15 MED ORDER — SODIUM CHLORIDE 0.9% FLUSH
3.0000 mL | Freq: Two times a day (BID) | INTRAVENOUS | Status: DC
  Administered 2018-09-15: 3 mL via INTRAVENOUS

## 2018-09-15 MED ORDER — PREDNISONE 20 MG PO TABS
20.0000 mg | ORAL_TABLET | Freq: Every day | ORAL | Status: DC
  Administered 2018-09-16: 20 mg via ORAL
  Filled 2018-09-15: qty 1

## 2018-09-15 MED ORDER — VERAPAMIL HCL 2.5 MG/ML IV SOLN
INTRAVENOUS | Status: AC
  Filled 2018-09-15: qty 2

## 2018-09-15 MED ORDER — NITROGLYCERIN 1 MG/10 ML FOR IR/CATH LAB
INTRA_ARTERIAL | Status: DC | PRN
  Administered 2018-09-15: 200 ug via INTRACORONARY

## 2018-09-15 MED ORDER — SODIUM CHLORIDE 0.9 % IV SOLN: 250.0000 mL | INTRAVENOUS | Status: DC | PRN

## 2018-09-15 MED ORDER — VERAPAMIL HCL 2.5 MG/ML IV SOLN
INTRAVENOUS | Status: DC | PRN
  Administered 2018-09-15: 10 mL via INTRA_ARTERIAL

## 2018-09-15 MED ORDER — HEPARIN SODIUM (PORCINE) 1000 UNIT/ML IJ SOLN
INTRAMUSCULAR | Status: DC | PRN
  Administered 2018-09-15: 4000 [IU] via INTRAVENOUS

## 2018-09-15 MED ORDER — SODIUM CHLORIDE 0.9 % IV BOLUS
INTRAVENOUS | Status: AC | PRN
  Administered 2018-09-15: 250 mL via INTRAVENOUS

## 2018-09-15 MED ORDER — FENTANYL CITRATE (PF) 100 MCG/2ML IJ SOLN
INTRAMUSCULAR | Status: DC | PRN
  Administered 2018-09-15 (×2): 25 ug via INTRAVENOUS

## 2018-09-15 MED ORDER — SODIUM CHLORIDE 0.9% FLUSH: 3.0000 mL | INTRAVENOUS | Status: DC | PRN

## 2018-09-15 SURGICAL SUPPLY — 11 items
CATH BALLN WEDGE 5F 110CM (CATHETERS) ×1 IMPLANT
CATH OPTITORQUE TIG 4.0 5F (CATHETERS) ×1 IMPLANT
DEVICE RAD COMP TR BAND LRG (VASCULAR PRODUCTS) ×1 IMPLANT
GLIDESHEATH SLEND A-KIT 6F 22G (SHEATH) ×1 IMPLANT
GUIDEWIRE INQWIRE 1.5J.035X260 (WIRE) IMPLANT
INQWIRE 1.5J .035X260CM (WIRE) ×2
KIT HEART LEFT (KITS) ×2 IMPLANT
PACK CARDIAC CATHETERIZATION (CUSTOM PROCEDURE TRAY) ×2 IMPLANT
SHEATH GLIDE SLENDER 4/5FR (SHEATH) ×1 IMPLANT
TRANSDUCER W/STOPCOCK (MISCELLANEOUS) ×2 IMPLANT
TUBING CIL FLEX 10 FLL-RA (TUBING) ×2 IMPLANT

## 2018-09-15 NOTE — Progress Notes (Signed)
Central tele called staff and said that patient had 6 beats of V-tach. Patient is asymptomatic, no complaints. MD made aware and he recommended Mg 1g IV and D/C tele monitor. Will continue to monitor.

## 2018-09-15 NOTE — Progress Notes (Signed)
Pt had 9 beat run Vtach. On assessment pt asymptomatic, VS stable. Cardiac catheterization scheduled in am. Schorr, NP notified. Will continue to monitor pt.

## 2018-09-15 NOTE — Progress Notes (Signed)
Patient refused to sign informed consent for cardiac cath procedure this AM; he wanted to watch cardiac cath video but staff had multiple attempts to call the educational number with no result. Patient said that he will sign the consent after he will talk with the doctor who will do his procedure. Cath lab informed. Will continue to monitor.

## 2018-09-15 NOTE — Progress Notes (Signed)
Patient back from cardiac cath lab; alert and oriented, no complaints of pain or other discomfort. VS stable. Will continue to monitor.

## 2018-09-15 NOTE — Progress Notes (Signed)
Pt did not sign informed consent for cardiac catheterization scheduled for 09/15/18.Marland Kitchen When asked to watch cardiac cath video at the beginning of shift, pt refused. Pt verbalized need to watch video after RN asked him to sign consent at 0530 on 09/15/18. RN attempted to call pt education multiple times in order for pt to watch video, but no answer. Later pt stated" I will sign it later,my head hurts now".  Pt advised to speak with physician prior to procedure if there are any concerns. Otherwise pt ready for procedure, Aspirin given, CHG bath completed, groins and radial artery region clipped.

## 2018-09-15 NOTE — Progress Notes (Signed)
  Pharmacy has been contacted to assist with discharge meds. When discharged, recommend sending all Rx to [facility] pharmacy so that pt can get meds delivered to bedside prior to d/c. Hopefully this will help with compliance. We have also asked pharmacy to assist with Bidil, as combo pill will hopefully be a better choice given issues with compliance. Case management to assist with cost analysis. Post hospital cardiology f/u has been arranged for 09/23/18. Appt date and time is in AVS. Per primary team rounding note, suspect d/c in the next 24-48 hrs. We will see on morning rounds tomorrow.   Brittainy Simmons,PA-C

## 2018-09-15 NOTE — Progress Notes (Addendum)
Progress Note  Patient Name: Daryl [name] Date of Encounter: 09/15/2018  Primary Cardiologist: Daryl RotundaJames Durrell Barajas, MD   Subjective   Doing ok this morning. He denies CP. SOB improved after thoracentesis. No supplemental O2 requirement. He is awaiting cath this morning.   Inpatient Medications    Scheduled Meds: . ALPRAZolam  0.25 mg Oral Once  . aspirin  81 mg Oral Daily  . atorvastatin  40 mg Oral q1800  . carvedilol  3.125 mg Oral BID WC  . cefdinir  300 mg Oral Q12H  . chlordiazePOXIDE  15 mg Oral TID  . doxycycline  100 mg Oral Q12H  . folic acid  1 mg Oral Daily  . furosemide  40 mg Oral Daily  . heparin injection (subcutaneous)  5,000 Units Subcutaneous Q8H  . isosorbide-hydrALAZINE  1 tablet Oral BID  . LORazepam  0-4 mg Intravenous Q6H   Followed by  . LORazepam  0-4 mg Intravenous Q12H  . multivitamin with minerals  1 tablet Oral Daily  . pneumococcal 23 valent vaccine  0.5 mL Intramuscular Tomorrow-1000  . predniSONE  50 mg Oral Q breakfast  . thiamine  100 mg Oral Daily   Or  . thiamine  100 mg Intravenous Daily   Continuous Infusions:  PRN Meds: albuterol, ipratropium-albuterol, lidocaine, LORazepam **OR** LORazepam   Vital Signs    Vitals:   09/14/18 1834 09/14/18 2150 09/15/18 0427 09/15/18 0448  BP: 116/79 117/78  (!) 103/58  Pulse: 93 95 (!) 101 99  Resp:  16 18 14   Temp: (!) 97.4 F (36.3 C) 98.1 F (36.7 C) 98.7 F (37.1 C) 98.4 F (36.9 C)  TempSrc: Oral Oral Axillary   SpO2:  95%  96%  Weight:      Height:        Intake/Output Summary (Last 24 hours) at 09/15/2018 [id number] Last data filed at 09/14/2018 1300 Gross per 24 hour  Intake 240 ml  Output -  Net 240 ml   Last 3 Weights 09/11/2018 01/16/2012  Weight (lbs) 172 lb 2.9 oz 167 lb 6.4 oz  Weight (kg) 78.1 kg 75.932 kg      Telemetry    Currently NSR, ventricular ectopy noted, bigeminy and trigeminy also NSVT, longest run was 7 beats - Personally Reviewed  ECG    Not performed  today- Personally Reviewed  Physical Exam   GEN: Middle aged AAM, in no acute distress.   Neck: No JVD Cardiac: RRR, no murmurs, rubs, or gallops.  Respiratory: Clear to auscultation bilaterally. GI: Soft, nontender, non-distended  MS: No edema; No deformity. Neuro:  Nonfocal  Psych: Normal affect   Labs    Chemistry Recent Labs  Lab 09/13/18 0459 09/14/18 0517 09/15/18 0441  NA 139 139 142  K 4.7 4.1 3.8  CL 108 105 109  CO2 18* 26 26  GLUCOSE 138* 118* 111*  BUN 18 27* 18  CREATININE 1.53* 1.62* 1.40*  CALCIUM 8.7* 8.2* 8.2*  PROT 5.4* 4.6* 4.6*  ALBUMIN 2.9* 2.6* 2.6*  AST 167* 150* 106*  ALT 193* 201* 186*  ALKPHOS 105 95 88  BILITOT 0.7 0.5 1.0  GFRNONAA 50* 47* 56*  GFRAA 58* 54* >60  ANIONGAP 13 8 7      Hematology Recent Labs  Lab 09/13/18 0753 09/14/18 0517 09/15/18 0441  WBC 22.0* 21.1* 18.3*  RBC 5.52 5.00 5.09  HGB 15.8 13.8 14.2  HCT 47.6 42.4 44.3  MCV 86.2 84.8 87.0  MCH 28.6 27.6 27.9  MCHC  33.2 32.5 32.1  RDW 13.5 13.4 13.6  PLT 298 301 324    Cardiac Enzymes Recent Labs  Lab 09/11/18 2009 09/12/18 0303 09/12/18 0925 09/12/18 1620  TROPONINI 0.04* 0.04* 0.07* 0.12*   No results for input(s): TROPIPOC in the last 168 hours.   BNP Recent Labs  Lab 09/11/18 2009 09/13/18 0753  BNP 1,771.8* 6,967.8*     DDimer No results for input(s): DDIMER in the last 168 hours.   Radiology    Dg Chest 1 View  Result Date: 09/13/2018 CLINICAL DATA:  Status post right thoracentesis. EXAM: CHEST  1 VIEW COMPARISON:  09/12/2017. FINDINGS: Stable enlarged cardiac silhouette. The previously seen right pleural fluid is no longer demonstrated. No pneumothorax. Mild peribronchial thickening. Mild scoliosis. IMPRESSION: 1. No pneumothorax following right thoracentesis. 2. Mild bronchitic changes. 3. Stable cardiomegaly. Electronically Signed   By: Beckie Salts M.D.   On: 09/13/2018 14:14   Ct Chest Wo Contrast  Result Date: 09/13/2018 CLINICAL  DATA:  Cough.  Follow-up unresolved pneumonia EXAM: CT CHEST WITHOUT CONTRAST TECHNIQUE: Multidetector CT imaging of the chest was performed following the standard protocol without IV contrast. COMPARISON:  Chest x-ray from yesterday FINDINGS: Cardiovascular: Cardiomegaly. Multifocal coronary atherosclerotic calcification. No acute vascular finding without contrast. Mediastinum/Nodes: Negative for adenopathy or mass. Lungs/Pleura: Moderate layering right pleural effusion. Mild atelectasis in the right lower lung. Generalized airway thickening and mild apical emphysema. 3 mm right lower lobe pulmonary nodule at the superior segment. Upper Abdomen: Negative Musculoskeletal: No acute or aggressive finding. Thoracic spondylosis. Gynecomastia IMPRESSION: 1. Moderate layering pleural effusion with atelectasis on the right. 2. Generalized airway thickening. 3. Cardiomegaly and coronary atherosclerosis. 4. 3 mm right lower lobe pulmonary nodule, consider chest CT without contrast in 1 year in this smoker. 5. Mild emphysema. Electronically Signed   By: Marnee Spring M.D.   On: 09/13/2018 09:42   Dg Chest Port 1 View  Result Date: 09/15/2018 CLINICAL DATA:  Shortness of breath EXAM: PORTABLE CHEST 1 VIEW COMPARISON:  09/13/2018 FINDINGS: Cardiac shadow remains enlarged. The lungs are well aerated bilaterally. Mild bronchitic changes are again seen bilaterally without focal confluent infiltrate. No bony abnormality is noted. IMPRESSION: Mild bronchitic changes stable from the previous exam. Electronically Signed   By: Alcide Clever M.D.   On: 09/15/2018 07:57   Ir Thoracentesis Asp Pleural Space W/img Guide  Result Date: 09/13/2018 INDICATION: Shortness of breath. Right-sided pleural effusion. Request for diagnostic and therapeutic thoracentesis. EXAM: ULTRASOUND GUIDED RIGHT THORACENTESIS MEDICATIONS: None. COMPLICATIONS: None immediate. PROCEDURE: An ultrasound guided thoracentesis was thoroughly discussed with the  patient and questions answered. The benefits, risks, alternatives and complications were also discussed. The patient understands and wishes to proceed with the procedure. Written consent was obtained. Ultrasound was performed to localize and mark an adequate pocket of fluid in the right chest. The area was then prepped and draped in the normal sterile fashion. 1% Lidocaine was used for local anesthesia. Under ultrasound guidance a 6 Fr Safe-T-Centesis catheter was introduced. Thoracentesis was performed. The catheter was removed and a dressing applied. FINDINGS: A total of approximately 1 L of slightly hazy yellow fluid was removed. Samples were sent to the laboratory as requested by the clinical team. IMPRESSION: Successful ultrasound guided right thoracentesis yielding 1 L of pleural fluid. Read by: Brayton El PA-C Electronically Signed   By: Richarda Overlie M.D.   On: 09/13/2018 14:15    Cardiac Studies   ECHO: 09/13/18:  Study Conclusions  - Left ventricle: The  cavity size was mildly dilated. Wall thickness was normal. The estimated ejection fraction was 20%. Diffuse hypokinesis. Doppler parameters are consistent with restrictive physiology, indicative of decreased left ventricular diastolic compliance and/or increased left atrial pressure. - Aortic valve: There was no stenosis. - Mitral valve: There was moderate to severe central regurgitation, probably functional. - Left atrium: The atrium was severely dilated. - Right ventricle: The cavity size was mildly dilated. Systolic function was mildly reduced. - Right atrium: The atrium was moderately dilated. - Tricuspid valve: There was moderate-severe regurgitation. Visually looks moderate but there is systolic flow reversal in the hepatic vein doppler pattern. Peak RV-RA gradient (S): 25 mm Hg. - Pulmonary arteries: PA peak pressure: 40 mm Hg (S). - Systemic veins: IVC measured 2.25 cm with < 50%  respirophasic variation, suggesting RA pressure 15 mmHg.  Impressions:  - Mildly dilated LV with severe diffuse hypokinesis, EF 20%. Restrictive diastolic function. Mildly dilated RV with mildly decreased systolic function. Biatrial enlargement. Moderate-severe central MR, probably function. Moderate-severe TR. Mild pulmonary hypertension. Dilated IVC suggestive of elevated RV filling presure.   Right and left heart cardiac catheterization: pending  Patient Profile     Daryl [name] a 57 y.o.malewith a hx of asthma andongoingtobacco abusewith no prior hx of CADwho is being seen today for the evaluation ofelevated troponinand new LV dysfunctionat the request ofDr. Thedore Mins. Note, pt is also being treated for URI.   Assessment & Plan    1. Cardiomyopathy: new diagnosis. Echo shows severely reduced LVEF at 20% with diffuse hypokinesis and moderate to severe MR. He has evidence of coronary artery calcifications on chest CT and multiple risk factors for CAD. Plan is for [facility] today for further assessment and to r/o obstructive CAD. Continue therapy w/ ? blocker. No ARB currently due to renal function. If renal function improves, may later consider addition of Entresto + spironolactone. Continue to monitor on tele. He has had frequent ventricular ectopy, with bigeminy, trigeminy and had multiple runs of NSVT. Longest run of NSVT in past 12 hrs was 7 beats. If further recurrence, may consider discharging home on Lifevest for primary prevention. Plan to d/c home on guidelines directed medical therapy for systolic HF. He will need gradual med titration as renal function, BP and HR allows. This can be done as outpatient. Once on optimal medical therapy, he will need repeat 2D Echo after 3 months. If EF remains < 35%, he will need referral to EP for consideration for ICD.   2. Acute Combined Systolic and Diastolic CHF:  Pt was diuresed with IV Lasix and also received therapeutic  thoracentesis for pleural effusion. His presenting symptoms of dyspnea/ orthopnea/ PND have resolved. He appears close to euvolemic state and w/o supplemental O2 requirements. Plan R/LHC today. RHC measurements will help better assess volume status and guide further diuresis. Continue strict I/Os, daily weights, low sodium diet and close monitoring of renal function and electrolytes.   3. Coronary Calcifications: noted on chest CT. Plan for cardiac cath today to further evaluate. LDL goal for CVD is <70 mg/dL. FLP this morning showed controlled levels at 69 mg/dL. He has been started on atorvastatin 40 mg this admit, but we should consider holding given elevated LFTs. Will defer to MD.   4. Valvular Dysfunction: moderate to severe MR and moderate to severe TR noted on echo. R/LHC pending. He has been diuresed. Continue management of  acute CHF.   5. Elevated Troponin: upward trend 0.04>>0.07>>0.12. 2D echo shows severely reduced LVEF  and diffuse hypokinesis. He denies CP but has evidence of coronary calcifications on chest CT. Plan for LHC today to assess for obstructive CAD.   6. ARF on CKD, Stage III: baseline SCr ~1.5. SCr improving, down from 1.6>>1.4 today. He is scheduled for cath today. Will do w/o LV Gram to limit contrast exposure. Monitor renal function closely post cath.   7. Elevated LFTs: Improving. AST down from 150>>106 and ALT down from 201>>186.  Possible cirrhosis on ultrasound. Hepatitis panel negative. He does have a h/o ETOH use. ? If we should temporarily d/c statin. Will defer to MD. Plan is for outpatient GI f/u.   8. Tobacco Abuse: smoking cessation strongly advised.   9. URI/ ? PNA: antibiotics per IM.   10. Right Pleural Effusion: s/p successful ultrasound guided right thoracentesis 1/20, yielding 1 L of pleural fluid. Fluid analysts pending. Pt notes subjective improvement. SOB resolved.    For questions or updates, please contact [facility] Please consult  [email or web address] for contact info under        Signed, Robbie Lis, PA-C  09/15/2018, 8:19 AM    History and all data above reviewed.  Patient examined. Mild coronary plaque on cath today.  No chest pain or SOB.  I agree with the findings as above.  The patient exam reveals COR:RRR  ,  Lungs: Clear  ,  Abd: Positive bowel sounds, no rebound no guarding, Ext No edema  .  All available labs, radiology testing, previous records reviewed. Agree with documented assessment and plan.   On BiDil and Coreg.  I am very concerned about his level of understanding and I think it is reasonable to send him home with these meds.  If we see that he is coming back for follow up I would then consider Sherryll Burger which will require a level of compliance that includes close follow up of his creat.  There is no indication for Live Vest.  I would not send him home on ASA or Lipitor.  I would send him home on a low dose of diuretic (40 mg daily.)  I would call this demand ischemia and not NSTEMI.     Fayrene Fearing Diera Wirkkala  11:09 AM  09/15/2018

## 2018-09-15 NOTE — Interval H&P Note (Signed)
History and Physical Interval Note:  09/15/2018 8:19 AM  Daryl [name]  has presented today for surgery, with the diagnosis of hf - elevated trop.   The various methods of treatment have been discussed with the patient and family. After consideration of risks, benefits and other options for treatment, the patient has consented to  Procedure(s): RIGHT/LEFT HEART CATH AND CORONARY ANGIOGRAPHY (N/A) with possible PERCUTANEOUS CORONARY INTERVENTION as a surgical intervention .  The patient's history has been reviewed, patient examined, no change in status, stable for surgery.  I have reviewed the patient's chart and labs.  Questions were answered to the patient's satisfaction.    Cath Lab Visit (complete for each Cath Lab visit)  Clinical Evaluation Leading to the Procedure:   ACS: No.  Non-ACS:    Anginal Classification: CCS II - CHF Sx  Anti-ischemic medical therapy: Minimal Therapy (1 class of medications)  Non-Invasive Test Results: High-risk stress test findings: cardiac mortality >3%/year; Echo results  Prior CABG: No previous CABG    Bryan Lemma

## 2018-09-15 NOTE — Progress Notes (Signed)
PROGRESS NOTE                                                                                                                                                                   This note should serve as patient's excuse from his court date as he is hospitalized currently with an expected discharge date of possibly 09/16/2018 or 09/17/2018.  But this may be delayed.                                            Patient Demographics:    Daryl [name], is a 58 y.o. male, DOB - [birth year], NFA:[id number]  Admit date - 09/11/2018   Admitting Physician Haydee Monica, MD  Outpatient Primary MD for the patient is Patient, No Pcp Per  LOS - 4  Chief Complaint  Patient presents with  . Shortness of Breath       Brief Narrative  Daryl [name] is a 58 y.o. male with medical history significant of asthma, tobacco abuse comes in with an illness that is been going on for 2 weeks but progressive worsening coughing shortness of breath fevers and chills at home.  Patient reports he has been having a productive cough.   Subjective:   Patient in bed, appears comfortable, denies any headache, no fever, no chest pain or pressure, no abdominal pain. No focal weakness. Mild orthopnea, +ve SOB.     Assessment  & Plan :     1. Sepsis with acute hypoxic respiratory failure in the setting of rhinovirus infection with superimposed bacterial infection.  He had significant hypoxia, abscess pathophysiology has resolved he appears nontoxic and has been tapered down to oral antibiotics, cultures unremarkable so far except for positive viral serology, continue supportive care and monitor.  2.  Flipped lateral T waves, mildly elevated troponin, elevated BNP and frequent V. tach runs.  He had asymptomatic multiple runs of nonsustained V. tach, echo showed a depressed EF of 20% with diffuse hypokinesis, he was placed on Coreg, BiDil, aspirin and statin for secondary prevention.  Cardiology was consulted and he is due  for left heart catheterization on 09/15/2018.  Discharge date based on catheterization outcome and future clinical course.   Lab Results  Component Value Date   CHOL 127 09/15/2018   HDL 33 (L) 09/15/2018   LDLCALC 69 09/15/2018   TRIG 124 09/15/2018   CHOLHDL 3.8 09/15/2018     3.  Right-sided 4.3 mm lung nodule and free-flowing right-sided pleural effusion.  Did not have any pleuritic chest pain hence I do not think this was parapneumonic effusion  or empyema, ultrasound-guided thoracentesis on the right side was requested by IR on 09/13/2018, 1 L of transudative fluid removed, pathology pending.  4.  Elevated LFTs.  Possible cirrhosis on ultrasound.  Negative hepatitis panel, have history of heavy alcohol use, continue supportive care and monitor trend on statin.  5.  ARF on CKD 3.  Baseline creatinine around 1.5.  Status close to euvolemia, will cut down on diuretics and monitor.  6.  Smoking history and alcohol use.  Counseled to quit.  Placed on Librium along with CIWA protocol.  7. 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     Family Communication  :  None  Code Status :  Full  Disposition Plan  :  TBD  Consults  :  Cards  Procedures  :    TTE - - Mildly dilated LV with severe diffuse hypokinesis, EF 20%. Restrictive diastolic function. Mildly dilated RV with mildly decreased systolic function. Biatrial enlargement. Moderate-severe central MR, probably function. Moderate-severe TR. Mild pulmonary hypertension. Dilated IVC suggestive of elevated RV filling presure.  CT chest - 1. Moderate layering pleural effusion with atelectasis on the right. 2. Generalized airway thickening. 3. Cardiomegaly and coronary atherosclerosis. 4. 3 mm right lower lobe pulmonary nodule, consider chest CT without contrast in 1 year in this smoker. 5. Mild emphysema.   RUQ US - mild ascites with ?  Cirrhosis  US guided R. Thoracentesis by IR  09/13/18 - 1 lit transudative fluid    DVT Prophylaxis  :    Heparin started 09/13/2018  Lab Results  Component Value Date   PLT 324 09/15/2018    Diet :  Diet Order            Diet NPO time specified Except for: Sips with Meds  Diet effective midnight               Inpatient Medications Scheduled Meds: . ALPRAZolam  0.25 mg Oral Once  . aspirin  81 mg Oral Daily  . atorvastatin  40 mg Oral q1800  . carvedilol  3.125 mg Oral BID WC  . cefdinir  300 mg Oral Q12H  . chlordiazePOXIDE  15 mg Oral TID  . doxycycline  100 mg Oral Q12H  . folic acid  1 mg Oral Daily  . furosemide  40 mg Oral Daily  . heparin injection (subcutaneous)  5,000 Units Subcutaneous Q8H  . isosorbide-hydrALAZINE  1 tablet Oral BID  . LORazepam  0-4 mg Intravenous Q6H   Followed by  . LORazepam  0-4 mg Intravenous Q12H  . multivitamin with minerals  1 tablet Oral Daily  . pneumococcal 23 valent vaccine  0.5 mL Intramuscular Tomorrow-1000  . predniSONE  50 mg Oral Q breakfast  . thiamine  100 mg Oral Daily   Or  . thiamine  100 mg Intravenous Daily   Continuous Infusions:  PRN Meds:.albuterol, ipratropium-albuterol, lidocaine, LORazepam **OR** LORazepam  Antibiotics  :   Anti-infectives (From admission, onward)   Start     Dose/Rate Route Frequency Ordered Stop   09/14/18 1345  cefdinir (OMNICEF) capsule 300 mg     300 mg Oral Every 12 hours 09/14/18 1343     09/13/18 1000  doxycycline (VIBRA-TABS) tablet 100 mg     100 mg Oral Every 12 hours 09/13/18 0735     09/11/18 2045  cefTRIAXone (ROCEPHIN) 2 g in sodium chloride 0.9 % 100 mL IVPB  Status:  Discontinued     2 g 200 mL/hr over 30 Minutes Intravenous Every 24 hours 09/11/18 2030 09/14/18 1342   09/11/18 2045  azithromycin (ZITHROMAX) 500 mg in sodium chloride 0.9 % 250 mL IVPB  Status:  Discontinued     500 mg 250 mL/hr over 60 Minutes Intravenous Every 24 hours 09/11/18 2030 09/13/18 0735          Objective:   Vitals:    09/14/18 1834 09/14/18 2150 09/15/18 0427 09/15/18 0448  BP: 116/79 117/78  (!) 103/58  Pulse: 93 95 (!) 101 99  Resp:  16 18 14   Temp: (!) 97.4 F (36.3 C) 98.1 F (36.7 C) 98.7 F (37.1 C) 98.4 F (36.9 C)  TempSrc: Oral Oral Axillary   SpO2:  95%  96%  Weight:      Height:        Wt Readings from Last 3 Encounters:  09/11/18 78.1 kg  01/16/12 75.9 kg     Intake/Output Summary (Last 24 hours) at 09/15/2018 0826 Last data filed at 09/14/2018 1300 Gross per 24 hour  Intake 240 ml  Output -  Net 240 ml     Physical Exam  Awake Alert, Oriented X 3, No new F.N deficits, Normal affect [place].AT,PERRAL Supple Neck,No JVD, No cervical lymphadenopathy appriciated.  Symmetrical Chest wall movement, Good air movement bilaterally, CTAB RRR,No Gallops, Rubs or new Murmurs, No Parasternal Heave +ve B.Sounds, Abd Soft, No tenderness, No organomegaly appriciated, No rebound - guarding or rigidity. No Cyanosis, Clubbing or edema, No new Rash or bruise   Data Review:    CBC Recent Labs  Lab 09/11/18 2009 09/12/18 0324 09/13/18 0753 09/14/18 0517 09/15/18 0441  WBC 8.7 10.9* 22.0* 21.1* 18.3*  HGB 16.5 15.3 15.8 13.8 14.2  HCT 53.6* 47.3 47.6 42.4 44.3  PLT 313 290 298 301 324  MCV 89.6 88.2 86.2 84.8 87.0  MCH 27.6 28.5 28.6 27.6 27.9  MCHC 30.8 32.3 33.2 32.5 32.1  RDW 13.8 13.7 13.5 13.4 13.6  LYMPHSABS 1.8 0.6*  --   --   --   MONOABS 0.8 0.2  --   --   --   EOSABS 0.0 0.1  --   --   --   BASOSABS 0.0 0.0  --   --   --     Chemistries  Recent Labs  Lab 09/11/18 2009 09/12/18 0303 09/13/18 0459 09/13/18 0753 09/14/18 0517 09/15/18 0441  NA 141 137 139  --  139 142  K 3.9 4.2 4.7  --  4.1 3.8  CL 106 108 108  --  105 109  CO2 23 18* 18*  --  26 26  GLUCOSE 118* 227* 138*  --  118* 111*  BUN 13 11 18   --  27* 18  CREATININE 1.65* 1.51* 1.53*  --  1.62* 1.40*  CALCIUM 8.8* 8.2* 8.7*  --  8.2* 8.2*  MG  --   --   --  1.9  --   --   AST 104* 94* 167*  --   150* 106*  ALT 169* 137* 193*  --  201* 186*  ALKPHOS 106 86 105  --  95 88  BILITOT 1.7* 1.2 0.7  --  0.5 1.0   ------------------------------------------------------------------------------------------------------------------ Recent Labs    09/15/18 0441  CHOL 127  HDL 33*  LDLCALC 69  TRIG 127  CHOLHDL 3.8    No results found for: HGBA1C ------------------------------------------------------------------------------------------------------------------ No results for input(s): TSH, T4TOTAL, T3FREE, THYROIDAB in  the last 72 hours.  Invalid input(s): FREET3 ------------------------------------------------------------------------------------------------------------------ No results for input(s): VITAMINB12, FOLATE, FERRITIN, TIBC, IRON, RETICCTPCT in the last 72 hours.  Coagulation profile No results for input(s): INR, PROTIME in the last 168 hours.  No results for input(s): DDIMER in the last 72 hours.  Cardiac Enzymes Recent Labs  Lab 09/12/18 0303 09/12/18 0925 09/12/18 1620  TROPONINI 0.04* 0.07* 0.12*   ------------------------------------------------------------------------------------------------------------------    Component Value Date/Time   BNP 1,876.5 (H) 09/13/2018 0753    Micro Results Recent Results (from the past 240 hour(s))  Culture, sputum-assessment     Status: None   Collection Time: 09/11/18  4:13 AM  Result Value Ref Range Status   Specimen Description EXPECTORATED SPUTUM  Final   Special Requests NONE  Final   Sputum evaluation   Final    FEW WBC PRESENT, PREDOMINANTLY PMN MODERATE GRAM POSITIVE COCCI IN PAIRS MODERATE GRAM NEGATIVE RODS FEW GRAM POSITIVE RODS THIS SPECIMEN IS ACCEPTABLE FOR SPUTUM CULTURE Performed at [facility], [address], [place], [place] [address]    Report Status 09/13/2018 FINAL  Final  Culture, respiratory     Status: None (Preliminary result)   Collection Time: 09/11/18  4:13 AM  Result Value Ref  Range Status   Specimen Description EXPECTORATED SPUTUM  Final   Special Requests NONE Reflexed from [id number]  Final   Gram Stain   Final    FEW WBC PRESENT, PREDOMINANTLY PMN MODERATE GRAM POSITIVE COCCI IN PAIRS MODERATE GRAM NEGATIVE RODS FEW GRAM POSITIVE RODS Performed at [facility], [address] [place], [place] [address]    Culture FEW Consistent with normal respiratory flora.  Final   Report Status PENDING  Incomplete  Blood Culture (routine x 2)     Status: None (Preliminary result)   Collection Time: 09/11/18  8:53 PM  Result Value Ref Range Status   Specimen Description BLOOD LEFT HAND  Final   Special Requests   Final    BOTTLES DRAWN AEROBIC AND ANAEROBIC Blood Culture adequate volume   Culture   Final    NO GROWTH 3 DAYS Performed at [facility], [address] [place], [place] [address]    Report Status PENDING  Incomplete  Blood Culture (routine x 2)     Status: None (Preliminary result)   Collection Time: 09/11/18  9:00 PM  Result Value Ref Range Status   Specimen Description BLOOD RIGHT HAND  Final   Special Requests   Final    BOTTLES DRAWN AEROBIC AND ANAEROBIC Blood Culture adequate volume   Culture   Final    NO GROWTH 3 DAYS Performed at [facility], [address] [place], [place] [address]    Report Status PENDING  Incomplete  Respiratory Panel by PCR     Status: Abnormal   Collection Time: 09/11/18 11:54 PM  Result Value Ref Range Status   Adenovirus NOT DETECTED NOT DETECTED Final   Coronavirus 229E NOT DETECTED NOT DETECTED Final   Coronavirus HKU1 NOT DETECTED NOT DETECTED Final   Coronavirus NL63 NOT DETECTED NOT DETECTED Final   Coronavirus OC43 NOT DETECTED NOT DETECTED Final   Metapneumovirus NOT DETECTED NOT DETECTED Final   Rhinovirus / Enterovirus DETECTED (A) NOT DETECTED Final   Influenza A NOT DETECTED NOT DETECTED Final   Influenza B NOT DETECTED NOT DETECTED Final   Parainfluenza Virus 1 NOT DETECTED NOT  DETECTED Final   Parainfluenza Virus 2 NOT DETECTED NOT DETECTED  Final   Parainfluenza Virus 3 NOT DETECTED NOT DETECTED Final   Parainfluenza Virus 4 NOT DETECTED NOT DETECTED Final   Respiratory Syncytial Virus NOT DETECTED NOT DETECTED Final   Bordetella pertussis NOT DETECTED NOT DETECTED Final   Chlamydophila pneumoniae NOT DETECTED NOT DETECTED Final   Mycoplasma pneumoniae NOT DETECTED NOT DETECTED Final  Gram stain     Status: None   Collection Time: 09/13/18 11:01 AM  Result Value Ref Range Status   Specimen Description PLEURAL  Final   Special Requests   Final    Normal Performed at [facility], [address] [place], [place] [address]    Gram Stain   Final    WBC PRESENT, PREDOMINANTLY PMN NO ORGANISMS SEEN CYTOSPIN SMEAR    Report Status 09/13/2018 FINAL  Final  Culture, body fluid-bottle     Status: None (Preliminary result)   Collection Time: 09/13/18  1:56 PM  Result Value Ref Range Status   Specimen Description PLEURAL  Final   Special Requests FLUID  Final   Culture   Final    NO GROWTH < 24 HOURS Performed at [facility], [address] [place], [place] [address]    Report Status PENDING  Incomplete    Radiology Reports Dg Chest 1 View  Result Date: 09/13/2018 CLINICAL DATA:  Status post right thoracentesis. EXAM: CHEST  1 VIEW COMPARISON:  09/12/2017. FINDINGS: Stable enlarged cardiac silhouette. The previously seen right pleural fluid is no longer demonstrated. No pneumothorax. Mild peribronchial thickening. Mild scoliosis. IMPRESSION: 1. No pneumothorax following right thoracentesis. 2. Mild bronchitic changes. 3. Stable cardiomegaly. Electronically Signed   By: Beckie SaltsSteven  Reid M.D.   On: 09/13/2018 14:14   Dg Chest 2 View  Result Date: 09/11/2018 CLINICAL DATA:  Short of breath. Cough and fever for 2 weeks. Hypertension. Smoker. EXAM: CHEST - 2 VIEW COMPARISON:  01/19/2007 FINDINGS: Lateral view degraded by patient arm position. Midline  trachea. Borderline cardiomegaly. No pleural fluid. Pulmonary interstitial thickening. Right greater than left base airspace disease. The right base airspace disease is favored to be centered in the right middle lobe. IMPRESSION: Right greater than left base airspace disease. The right middle lobe opacity is suspicious for pneumonia. Left lower lobe more patchy may may could represent atelectasis. Borderline cardiomegaly with mild pulmonary interstitial thickening. Suspicious for pulmonary venous congestion. Electronically Signed   By: Jeronimo GreavesKyle  Talbot M.D.   On: 09/11/2018 20:19   Ct Chest Wo Contrast  Result Date: 09/13/2018 CLINICAL DATA:  Cough.  Follow-up unresolved pneumonia EXAM: CT CHEST WITHOUT CONTRAST TECHNIQUE: Multidetector CT imaging of the chest was performed following the standard protocol without IV contrast. COMPARISON:  Chest x-ray from yesterday FINDINGS: Cardiovascular: Cardiomegaly. Multifocal coronary atherosclerotic calcification. No acute vascular finding without contrast. Mediastinum/Nodes: Negative for adenopathy or mass. Lungs/Pleura: Moderate layering right pleural effusion. Mild atelectasis in the right lower lung. Generalized airway thickening and mild apical emphysema. 3 mm right lower lobe pulmonary nodule at the superior segment. Upper Abdomen: Negative Musculoskeletal: No acute or aggressive finding. Thoracic spondylosis. Gynecomastia IMPRESSION: 1. Moderate layering pleural effusion with atelectasis on the right. 2. Generalized airway thickening. 3. Cardiomegaly and coronary atherosclerosis. 4. 3 mm right lower lobe pulmonary nodule, consider chest CT without contrast in 1 year in this smoker. 5. Mild emphysema. Electronically Signed   By: Marnee SpringJonathon  Watts M.D.   On: 09/13/2018 09:42   [place] Abdomen Complete  Result Date: 09/13/2018 CLINICAL DATA:  Initial evaluation for acute right upper  quadrant pain EXAM: ABDOMEN ULTRASOUND COMPLETE COMPARISON:  None. FINDINGS: Gallbladder: No  stones or sludge within the gallbladder lumen. Gallbladder wall thickened up to 7 mm, suspected to be related to overall volume status. No free pericholecystic fluid. No sonographic Murphy sign elicited on exam. Common bile duct: Diameter: 6.4 mm. Liver: No focal lesion identified. Diffusely echogenic echotexture within the liver, which can be seen in the setting of steatosis or cirrhosis. No significant hepatic nodularity. Portal vein is patent on color Doppler imaging with normal direction of blood flow towards the liver. IVC: No abnormality visualized. Pancreas: Not visualized. Spleen: Size and appearance within normal limits. Right Kidney: Length: 12.8 cm. Echogenicity within normal limits. No mass or hydronephrosis visualized. Left Kidney: Length: 8.9 cm. Left kidney poorly visualized, but appears somewhat atrophic. Echogenicity within normal limits. No mass or hydronephrosis visualized. Abdominal aorta: No aneurysm visualized. Other findings: Note made of a right pleural effusion. Small volume ascites seen adjacent to the liver. IMPRESSION: 1. Diffusely increased echogenicity within the hepatic parenchyma, which can be seen in the setting of steatosis or possibly cirrhosis. Correlation with LFTs recommended. 2. Small volume ascites within the upper abdomen. 3. Small right pleural effusion. 4. Mild gallbladder wall thickening, suspected to be related to ascites and/or volume status. No cholelithiasis or additional sonographic features to suggest acute cholecystitis. No biliary dilatation. 5. Mildly small and atrophic left kidney. Electronically Signed   By: Rise Mu M.D.   On: 09/13/2018 04:53   Dg Chest Port 1 View  Result Date: 09/15/2018 CLINICAL DATA:  Shortness of breath EXAM: PORTABLE CHEST 1 VIEW COMPARISON:  09/13/2018 FINDINGS: Cardiac shadow remains enlarged. The lungs are well aerated bilaterally. Mild bronchitic changes are again seen bilaterally without focal confluent infiltrate.  No bony abnormality is noted. IMPRESSION: Mild bronchitic changes stable from the previous exam. Electronically Signed   By: Alcide Clever M.D.   On: 09/15/2018 07:57   Dg Chest Port 1 View  Result Date: 09/12/2018 CLINICAL DATA:  Short of breath, labored breathing EXAM: PORTABLE CHEST 1 VIEW COMPARISON:  09/11/18 FINDINGS: Stable enlarged cardiac silhouette. Increase in RIGHT pleural effusion. Potential RIGHT lower lobe airspace disease. Upper lungs are clear. IMPRESSION: 1. Increased RIGHT effusion.  Potential parapneumonic effusion. 2. Cardiomegaly Electronically Signed   By: Genevive Bi M.D.   On: 09/12/2018 23:04   Ir Thoracentesis Asp Pleural Space W/img Guide  Result Date: 09/13/2018 INDICATION: Shortness of breath. Right-sided pleural effusion. Request for diagnostic and therapeutic thoracentesis. EXAM: ULTRASOUND GUIDED RIGHT THORACENTESIS MEDICATIONS: None. COMPLICATIONS: None immediate. PROCEDURE: An ultrasound guided thoracentesis was thoroughly discussed with the patient and questions answered. The benefits, risks, alternatives and complications were also discussed. The patient understands and wishes to proceed with the procedure. Written consent was obtained. Ultrasound was performed to localize and mark an adequate pocket of fluid in the right chest. The area was then prepped and draped in the normal sterile fashion. 1% Lidocaine was used for local anesthesia. Under ultrasound guidance a 6 Fr Safe-T-Centesis catheter was introduced. Thoracentesis was performed. The catheter was removed and a dressing applied. FINDINGS: A total of approximately 1 L of slightly hazy yellow fluid was removed. Samples were sent to the laboratory as requested by the clinical team. IMPRESSION: Successful ultrasound guided right thoracentesis yielding 1 L of pleural fluid. Read by: Brayton El PA-C Electronically Signed   By: Richarda Overlie M.D.   On: 09/13/2018 14:15    Time Spent in minutes  30   Prashant  Thedore Mins M.D on 09/15/2018 at 8:26 AM  To page go to [email or web address] - password [email or web address]

## 2018-09-16 LAB — COMPREHENSIVE METABOLIC PANEL
ALT: 176 U/L — ABNORMAL HIGH (ref 0–44)
AST: 88 U/L — AB (ref 15–41)
Albumin: 2.5 g/dL — ABNORMAL LOW (ref 3.5–5.0)
Alkaline Phosphatase: 87 U/L (ref 38–126)
Anion gap: 7 (ref 5–15)
BUN: 14 mg/dL (ref 6–20)
CO2: 28 mmol/L (ref 22–32)
Calcium: 7.7 mg/dL — ABNORMAL LOW (ref 8.9–10.3)
Chloride: 105 mmol/L (ref 98–111)
Creatinine, Ser: 1.32 mg/dL — ABNORMAL HIGH (ref 0.61–1.24)
GFR calc Af Amer: >60 mL/min (ref >60)
GFR, EST NON AFRICAN AMERICAN: 60 mL/min — AB (ref >60)
Glucose, Bld: 122 mg/dL — ABNORMAL HIGH (ref 70–99)
Potassium: 3.2 mmol/L — ABNORMAL LOW (ref 3.5–5.1)
Sodium: 140 mmol/L (ref 135–145)
Total Bilirubin: 1.4 mg/dL — ABNORMAL HIGH (ref 0.3–1.2)
Total Protein: 4.7 g/dL — ABNORMAL LOW (ref 6.5–8.1)

## 2018-09-16 LAB — CBC
HCT: 45.4 % (ref 39.0–52.0)
Hemoglobin: 14.5 g/dL (ref 13.0–17.0)
MCH: 28 pg (ref 26.0–34.0)
MCHC: 31.9 g/dL (ref 30.0–36.0)
MCV: 87.8 fL (ref 80.0–100.0)
Platelets: 358 10*3/uL (ref 150–400)
RBC: 5.17 MIL/uL (ref 4.22–5.81)
RDW: 13.8 % (ref 11.5–15.5)
WBC: 10.4 10*3/uL (ref 4.0–10.5)
nRBC: 0 % (ref 0.0–0.2)

## 2018-09-16 LAB — CULTURE, BLOOD (ROUTINE X 2)
CULTURE: NO GROWTH
Culture: NO GROWTH
Special Requests: ADEQUATE
Special Requests: ADEQUATE

## 2018-09-16 LAB — MAGNESIUM: Magnesium: 2 mg/dL (ref 1.7–2.4)

## 2018-09-16 MED ORDER — CARVEDILOL 3.125 MG PO TABS: 3.1250 mg | ORAL_TABLET | Freq: Two times a day (BID) | ORAL | 0 refills | Status: DC

## 2018-09-16 MED ORDER — ISOSORB DINITRATE-HYDRALAZINE 20-37.5 MG PO TABS: 1.0000 | ORAL_TABLET | Freq: Two times a day (BID) | ORAL | 0 refills | Status: DC

## 2018-09-16 MED ORDER — POTASSIUM CHLORIDE CRYS ER 20 MEQ PO TBCR
40.0000 meq | EXTENDED_RELEASE_TABLET | Freq: Once | ORAL | Status: AC
  Administered 2018-09-16: 40 meq via ORAL
  Filled 2018-09-16: qty 2

## 2018-09-16 MED ORDER — FOLIC ACID 1 MG PO TABS: 1.0000 mg | ORAL_TABLET | Freq: Every day | ORAL | 0 refills | Status: DC

## 2018-09-16 MED ORDER — THIAMINE HCL 100 MG PO TABS: 100.0000 mg | ORAL_TABLET | Freq: Every day | ORAL | 0 refills | Status: DC

## 2018-09-16 MED ORDER — ATORVASTATIN CALCIUM 40 MG PO TABS: 40.0000 mg | ORAL_TABLET | Freq: Every day | ORAL | 0 refills | Status: DC

## 2018-09-16 MED ORDER — FUROSEMIDE 40 MG PO TABS: 40.0000 mg | ORAL_TABLET | Freq: Every day | ORAL | 0 refills | Status: DC

## 2018-09-16 MED ORDER — SPIRONOLACTONE 25 MG PO TABS: 12.5000 mg | ORAL_TABLET | Freq: Every day | ORAL | 0 refills | Status: DC

## 2018-09-16 MED ORDER — ASPIRIN 81 MG PO CHEW: 81.0000 mg | CHEWABLE_TABLET | Freq: Every day | ORAL | 0 refills | Status: DC

## 2018-09-16 MED FILL — BIDIL TABLET: 20-37.5 | 30 days supply | Qty: 60 | Fill #0

## 2018-09-16 MED FILL — FUROSEMIDE 40 MG TAB: 40 | 30 days supply | Qty: 30 | Fill #0

## 2018-09-16 MED FILL — SPIRONOLACTONE 25 MG TABLET: 25 | 15 days supply | Qty: 15 | Fill #0

## 2018-09-16 MED FILL — FOLIC ACID 1 MG TABS: 1 | 30 days supply | Qty: 30 | Fill #0

## 2018-09-16 MED FILL — CARVEDILOL 3.125 MG TABLET: 3.125 | 15 days supply | Qty: 30 | Fill #0

## 2018-09-16 MED FILL — ATORVASTATIN 40 MG TABLET: 40 | 30 days supply | Qty: 30 | Fill #0

## 2018-09-16 NOTE — Progress Notes (Addendum)
Patient was discharged home by MD order; discharged instructions review and give to patient with care notes; patient was encouraged to keep his appointment with the cardiology doctor and go to the [facility] to pick up his medication today. IV DIC; skin intact; patient will be escorted to the car by volunteer via wheelchair.

## 2018-09-16 NOTE — Discharge Summary (Signed)
Daryl [name] SLP:[id number] DOB: [birth year] DOA: 09/11/2018  PCP: Patient, No Pcp Per  Admit date: 09/11/2018  Discharge date: 09/16/2018  Admitted From: Home   Disposition:  Home   Recommendations for Outpatient Follow-up:   Follow up with PCP in 1-2 weeks  PCP Please obtain BMP/CBC, 2 view CXR in 1week,  (see Discharge instructions)   PCP Please follow up on the following pending results: Follow biopsy results which are pending from the thoracentesis.   Home Health: None   Equipment/Devices: None  Consultations: Cards Discharge Condition: Fair   CODE STATUS: Full   Diet Recommendation: Heart Healthy 1.5lit/day fluid restriction  Chief Complaint  Patient presents with  . Shortness of Breath     Brief history of present illness from the day of admission and additional interim summary    Daryl [name] a 57 y.o.malewith medical history significant ofasthma, tobacco abuse comes in with an illness that is been going on for 2 weeks but progressive worsening coughing shortness of breath fevers and chills at home. Patient reports he has been having a productive cough.                                                                   Hospital Course   1. Sepsis with acute hypoxic respiratory failure in the setting of rhinovirus infection with superimposed bacterial infection.  He had significant hypoxia, sepsis pathophysiology resolved and infection has resolved with supportive care.  Initially required IV antibiotics currently no signs of infection has finished antibiotic course.  2.  Flipped lateral T waves, mildly elevated troponin, elevated BNP and frequent V. tach runs.  He had asymptomatic multiple runs of nonsustained V. tach, echo showed a depressed EF of 20% with diffuse hypokinesis, he was placed on  Coreg, BiDil, aspirin, Lasix, Aldactone and statin for secondary prevention.  Cardiology was consulted underwent left heart catheterization by Dr. Herbie Baltimore on 09/15/2018, he had no significant CAD.  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  Strictly counseled to quit smoking and alcohol.  3.  Right-sided 4.3 mm lung nodule and free-flowing right-sided pleural effusion.  Did not have any pleuritic chest pain hence I do not think this was parapneumonic effusion or empyema, ultrasound-guided thoracentesis on the right side was requested by IR on 09/13/2018, 1 L of transudative fluid removed, pathology pending.  4.  Elevated LFTs.  Possible cirrhosis on ultrasound.  Negative hepatitis panel, have history of heavy alcohol use, continue supportive care and monitor trend on statin.  5.  ARF on CKD 3.  Baseline creatinine around 1.5.  Status close to euvolemia, will cut down on diuretics and monitor.  Function improved  and close to baseline.  6.  Smoking history and alcohol use.  Counseled to quit.  Placed on folic acid and thiamine upon discharge no signs of DTs.  7. 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 Kindly see #2 above      Discharge diagnosis     Principal Problem:   PNA (pneumonia) Active Problems:   Tobacco abuse   AKI (acute kidney injury) (HCC)   Acute respiratory failure (HCC)   Sepsis (HCC)   Acute combined systolic and diastolic heart failure (HCC)   Dilated cardiomyopathy (HCC)   NSVT (nonsustained ventricular tachycardia) (HCC)    Discharge instructions    Discharge Instructions    Discharge instructions   Complete by:  As directed    Follow with Primary MD in 7 days   Get CBC, CMP, 2 view Chest X ray -  checked  by Primary MD in 5-7 days   Activity: As  tolerated with Full fall precautions use walker/cane & assistance as needed  Disposition Home    Diet: Heart Healthy with 1.2 lit/day fluid restriction  Special Instructions: If you have smoked or chewed Tobacco  in the last 2 yrs please stop smoking, stop any regular Alcohol  and or any Recreational drug use.  On your next visit with your primary care physician please Get Medicines reviewed and adjusted.  Please request your Prim.MD to go over all Hospital Tests and Procedure/Radiological results at the follow up, please get all Hospital records sent to your Prim MD by signing hospital release before you go home.  If you experience worsening of your admission symptoms, develop shortness of breath, life threatening emergency, suicidal or homicidal thoughts you must seek medical attention immediately by calling 911 or calling your MD immediately  if symptoms less severe.  You Must read complete instructions/literature along with all the possible adverse reactions/side effects for all the Medicines you take and that have been prescribed to you. Take any new Medicines after you have completely understood and accpet all the possible adverse reactions/side effects.   Increase activity slowly   Complete by:  As directed       Discharge Medications   Allergies as of 09/16/2018      Reactions   Penicillins Swelling   Did it involve swelling of the face/tongue/throat, SOB, or low BP? Yes Did it involve sudden or severe rash/hives, skin peeling, or any reaction on the inside of your mouth or nose? No Did you need to seek medical attention at a hospital or doctor's office? Yes When did it last happen?young adult If all above answers are "NO", may proceed with cephalosporin use.      Medication List    STOP taking these medications   clindamycin 300 MG capsule Commonly known as:  CLEOCIN   hydrochlorothiazide 25 MG tablet Commonly known as:  HYDRODIURIL   meclizine 50 MG  tablet Commonly known as:  ANTIVERT   naproxen 500 MG tablet Commonly known as:  NAPROSYN   OVER THE COUNTER MEDICATION     TAKE these medications   aspirin 81 MG chewable tablet Chew 1 tablet (81 mg total) by mouth daily. Start taking on:  September 17, 2018   atorvastatin 40 MG tablet Commonly known as:  LIPITOR Take 1 tablet (40 mg total) by mouth daily at 6 PM.   carvedilol 3.125 MG tablet Commonly known as:  COREG Take 1 tablet (3.125 mg total)  by mouth 2 (two) times daily with a meal.   folic acid 1 MG tablet Commonly known as:  FOLVITE Take 1 tablet (1 mg total) by mouth daily.   furosemide 40 MG tablet Commonly known as:  LASIX Take 1 tablet (40 mg total) by mouth daily.   isosorbide-hydrALAZINE 20-37.5 MG tablet Commonly known as:  BIDIL Take 1 tablet by mouth 2 (two) times daily.   spironolactone 25 MG tablet Commonly known as:  ALDACTONE Take 0.5 tablets (12.5 mg total) by mouth daily for 30 days.   thiamine 100 MG tablet Take 1 tablet (100 mg total) by mouth daily.       Follow-up Information    [facility]. Go on 10/05/2018.   Why:  9:30 am, Dr,Wright Contact information: [address] [place] [phone]       Laurey MoraleMcLean, Dalton S, MD. Schedule an appointment as soon as possible for a visit in 1 week(s).   Specialty:  Cardiology Why:  CHF Contact information: [address] [place] [address] [phone]           Major procedures and Radiology Reports - PLEASE review detailed and final reports thoroughly  -     TTE - - Mildly dilated LV with severe diffuse hypokinesis, EF 20%. Restrictive diastolic function. Mildly dilated RV with mildly decreased systolic function. Biatrial enlargement. Moderate-severe central MR, probably function. Moderate-severe TR. Mild pulmonary hypertension. Dilated IVC suggestive of elevated RV filling presure.  CT chest - 1.  Moderate layering pleural effusion with atelectasis on the right. 2. Generalized airway thickening. 3. Cardiomegaly and coronary atherosclerosis. 4. 3 mm right lower lobe pulmonary nodule, consider chest CT without contrast in 1 year in this smoker. 5. Mild emphysema.   RUQ US - mild ascites with ?  Cirrhosis  US guided R. Thoracentesis by IR 09/13/18 - 1 lit transudative fluid -cytology pathology pending.   Left heart catheterization   SUMMARY  Angiographic evidence of coronary spasm relieved with nitroglycerin in the mid and distal RCA otherwise minimal disease in the LAD.  NONISCHEMIC CARDIOMYOPATHY  Evidence of elevated right-sided filling pressures with an RAP of 14 mmHg and RVEDP of 17 million mercury consistent with LVEDP of 17-20 mmHg.  RECOMMENDATIONS  Patient will return to nursing unit after TR band removal.  Continue cardiomyopathy treatment regimen.  Appears to be essentially euvolemic by cath.  Defer timing of discharge to primary team and consultative service.   Dg Chest 1 View  Result Date: 09/13/2018 CLINICAL DATA:  Status post right thoracentesis. EXAM: CHEST  1 VIEW COMPARISON:  09/12/2017. FINDINGS: Stable enlarged cardiac silhouette. The previously seen right pleural fluid is no longer demonstrated. No pneumothorax. Mild peribronchial thickening. Mild scoliosis. IMPRESSION: 1. No pneumothorax following right thoracentesis. 2. Mild bronchitic changes. 3. Stable cardiomegaly. Electronically Signed   By: Beckie SaltsSteven  Reid M.D.   On: 09/13/2018 14:14   Dg Chest 2 View  Result Date: 09/11/2018 CLINICAL DATA:  Short of breath. Cough and fever for 2 weeks. Hypertension. Smoker. EXAM: CHEST - 2 VIEW COMPARISON:  01/19/2007 FINDINGS: Lateral view degraded by patient arm position. Midline trachea. Borderline cardiomegaly. No pleural fluid. Pulmonary interstitial thickening. Right greater than left base airspace disease. The right base airspace disease is favored to be  centered in the right middle lobe. IMPRESSION: Right greater than left base airspace disease. The right middle lobe opacity is suspicious for pneumonia. Left lower lobe more patchy may may  could represent atelectasis. Borderline cardiomegaly with mild pulmonary interstitial thickening. Suspicious for pulmonary venous congestion. Electronically Signed   By: Jeronimo Greaves M.D.   On: 09/11/2018 20:19   Ct Chest Wo Contrast  Result Date: 09/13/2018 CLINICAL DATA:  Cough.  Follow-up unresolved pneumonia EXAM: CT CHEST WITHOUT CONTRAST TECHNIQUE: Multidetector CT imaging of the chest was performed following the standard protocol without IV contrast. COMPARISON:  Chest x-ray from yesterday FINDINGS: Cardiovascular: Cardiomegaly. Multifocal coronary atherosclerotic calcification. No acute vascular finding without contrast. Mediastinum/Nodes: Negative for adenopathy or mass. Lungs/Pleura: Moderate layering right pleural effusion. Mild atelectasis in the right lower lung. Generalized airway thickening and mild apical emphysema. 3 mm right lower lobe pulmonary nodule at the superior segment. Upper Abdomen: Negative Musculoskeletal: No acute or aggressive finding. Thoracic spondylosis. Gynecomastia IMPRESSION: 1. Moderate layering pleural effusion with atelectasis on the right. 2. Generalized airway thickening. 3. Cardiomegaly and coronary atherosclerosis. 4. 3 mm right lower lobe pulmonary nodule, consider chest CT without contrast in 1 year in this smoker. 5. Mild emphysema. Electronically Signed   By: Marnee Spring M.D.   On: 09/13/2018 09:42   US Abdomen Complete  Result Date: 09/13/2018 CLINICAL DATA:  Initial evaluation for acute right upper quadrant pain EXAM: ABDOMEN ULTRASOUND COMPLETE COMPARISON:  None. FINDINGS: Gallbladder: No stones or sludge within the gallbladder lumen. Gallbladder wall thickened up to 7 mm, suspected to be related to overall volume status. No free pericholecystic fluid. No sonographic  Murphy sign elicited on exam. Common bile duct: Diameter: 6.4 mm. Liver: No focal lesion identified. Diffusely echogenic echotexture within the liver, which can be seen in the setting of steatosis or cirrhosis. No significant hepatic nodularity. Portal vein is patent on color Doppler imaging with normal direction of blood flow towards the liver. IVC: No abnormality visualized. Pancreas: Not visualized. Spleen: Size and appearance within normal limits. Right Kidney: Length: 12.8 cm. Echogenicity within normal limits. No mass or hydronephrosis visualized. Left Kidney: Length: 8.9 cm. Left kidney poorly visualized, but appears somewhat atrophic. Echogenicity within normal limits. No mass or hydronephrosis visualized. Abdominal aorta: No aneurysm visualized. Other findings: Note made of a right pleural effusion. Small volume ascites seen adjacent to the liver. IMPRESSION: 1. Diffusely increased echogenicity within the hepatic parenchyma, which can be seen in the setting of steatosis or possibly cirrhosis. Correlation with LFTs recommended. 2. Small volume ascites within the upper abdomen. 3. Small right pleural effusion. 4. Mild gallbladder wall thickening, suspected to be related to ascites and/or volume status. No cholelithiasis or additional sonographic features to suggest acute cholecystitis. No biliary dilatation. 5. Mildly small and atrophic left kidney. Electronically Signed   By: Rise Mu M.D.   On: 09/13/2018 04:53   Dg Chest Port 1 View  Result Date: 09/15/2018 CLINICAL DATA:  Shortness of breath EXAM: PORTABLE CHEST 1 VIEW COMPARISON:  09/13/2018 FINDINGS: Cardiac shadow remains enlarged. The lungs are well aerated bilaterally. Mild bronchitic changes are again seen bilaterally without focal confluent infiltrate. No bony abnormality is noted. IMPRESSION: Mild bronchitic changes stable from the previous exam. Electronically Signed   By: Alcide Clever M.D.   On: 09/15/2018 07:57   Dg Chest Port  1 View  Result Date: 09/12/2018 CLINICAL DATA:  Short of breath, labored breathing EXAM: PORTABLE CHEST 1 VIEW COMPARISON:  09/11/18 FINDINGS: Stable enlarged cardiac silhouette. Increase in RIGHT pleural effusion. Potential RIGHT lower lobe airspace disease. Upper lungs are clear. IMPRESSION: 1. Increased RIGHT effusion.  Potential parapneumonic effusion. 2. Cardiomegaly Electronically Signed  By: Genevive BiStewart  Edmunds M.D.   On: 09/12/2018 23:04   Ir Thoracentesis Asp Pleural Space W/img Guide  Result Date: 09/13/2018 INDICATION: Shortness of breath. Right-sided pleural effusion. Request for diagnostic and therapeutic thoracentesis. EXAM: ULTRASOUND GUIDED RIGHT THORACENTESIS MEDICATIONS: None. COMPLICATIONS: None immediate. PROCEDURE: An ultrasound guided thoracentesis was thoroughly discussed with the patient and questions answered. The benefits, risks, alternatives and complications were also discussed. The patient understands and wishes to proceed with the procedure. Written consent was obtained. Ultrasound was performed to localize and mark an adequate pocket of fluid in the right chest. The area was then prepped and draped in the normal sterile fashion. 1% Lidocaine was used for local anesthesia. Under ultrasound guidance a 6 Fr Safe-T-Centesis catheter was introduced. Thoracentesis was performed. The catheter was removed and a dressing applied. FINDINGS: A total of approximately 1 L of slightly hazy yellow fluid was removed. Samples were sent to the laboratory as requested by the clinical team. IMPRESSION: Successful ultrasound guided right thoracentesis yielding 1 L of pleural fluid. Read by: Brayton ElKevin Bruning PA-C Electronically Signed   By: Richarda OverlieAdam  Henn M.D.   On: 09/13/2018 14:15    Micro Results    Recent Results (from the past 240 hour(s))  Culture, sputum-assessment     Status: None   Collection Time: 09/11/18  4:13 AM  Result Value Ref Range Status   Specimen Description EXPECTORATED SPUTUM   Final   Special Requests NONE  Final   Sputum evaluation   Final    FEW WBC PRESENT, PREDOMINANTLY PMN MODERATE GRAM POSITIVE COCCI IN PAIRS MODERATE GRAM NEGATIVE RODS FEW GRAM POSITIVE RODS THIS SPECIMEN IS ACCEPTABLE FOR SPUTUM CULTURE Performed at [facility], [address] [place], [place] [address]    Report Status 09/13/2018 FINAL  Final  Culture, respiratory     Status: None   Collection Time: 09/11/18  4:13 AM  Result Value Ref Range Status   Specimen Description EXPECTORATED SPUTUM  Final   Special Requests NONE Reflexed from [id number]  Final   Gram Stain   Final    FEW WBC PRESENT, PREDOMINANTLY PMN MODERATE GRAM POSITIVE COCCI IN PAIRS MODERATE GRAM NEGATIVE RODS FEW GRAM POSITIVE RODS Performed at [facility], [address] [place], [place] [address]    Culture FEW Consistent with normal respiratory flora.  Final   Report Status 09/15/2018 FINAL  Final  Blood Culture (routine x 2)     Status: None (Preliminary result)   Collection Time: 09/11/18  8:53 PM  Result Value Ref Range Status   Specimen Description BLOOD LEFT HAND  Final   Special Requests   Final    BOTTLES DRAWN AEROBIC AND ANAEROBIC Blood Culture adequate volume   Culture   Final    NO GROWTH 4 DAYS Performed at [facility], [address] [place], [place] [address]    Report Status PENDING  Incomplete  Blood Culture (routine x 2)     Status: None (Preliminary result)   Collection Time: 09/11/18  9:00 PM  Result Value Ref Range Status   Specimen Description BLOOD RIGHT HAND  Final   Special Requests   Final    BOTTLES DRAWN AEROBIC AND ANAEROBIC Blood Culture adequate volume   Culture   Final    NO GROWTH 4 DAYS Performed at [facility], [address] [place], [place] [address]    Report Status PENDING  Incomplete  Respiratory Panel by PCR  Status: Abnormal   Collection Time: 09/11/18 11:54 PM  Result Value Ref Range Status   Adenovirus NOT DETECTED NOT  DETECTED Final   Coronavirus 229E NOT DETECTED NOT DETECTED Final   Coronavirus HKU1 NOT DETECTED NOT DETECTED Final   Coronavirus NL63 NOT DETECTED NOT DETECTED Final   Coronavirus OC43 NOT DETECTED NOT DETECTED Final   Metapneumovirus NOT DETECTED NOT DETECTED Final   Rhinovirus / Enterovirus DETECTED (A) NOT DETECTED Final   Influenza A NOT DETECTED NOT DETECTED Final   Influenza B NOT DETECTED NOT DETECTED Final   Parainfluenza Virus 1 NOT DETECTED NOT DETECTED Final   Parainfluenza Virus 2 NOT DETECTED NOT DETECTED Final   Parainfluenza Virus 3 NOT DETECTED NOT DETECTED Final   Parainfluenza Virus 4 NOT DETECTED NOT DETECTED Final   Respiratory Syncytial Virus NOT DETECTED NOT DETECTED Final   Bordetella pertussis NOT DETECTED NOT DETECTED Final   Chlamydophila pneumoniae NOT DETECTED NOT DETECTED Final   Mycoplasma pneumoniae NOT DETECTED NOT DETECTED Final  Gram stain     Status: None   Collection Time: 09/13/18 11:01 AM  Result Value Ref Range Status   Specimen Description PLEURAL  Final   Special Requests   Final    Normal Performed at [facility], [address] [place], [place] [address]    Gram Stain   Final    WBC PRESENT, PREDOMINANTLY PMN NO ORGANISMS SEEN CYTOSPIN SMEAR    Report Status 09/13/2018 FINAL  Final  Culture, body fluid-bottle     Status: None (Preliminary result)   Collection Time: 09/13/18  1:56 PM  Result Value Ref Range Status   Specimen Description PLEURAL  Final   Special Requests FLUID  Final   Culture   Final    NO GROWTH 2 DAYS Performed at [facility], [address] [place], [place] [address]    Report Status PENDING  Incomplete    Today   Subjective    Daryl [name] today has no headache,no chest abdominal pain,no new weakness tingling or numbness, feels much better wants to go home today.    Objective   Blood pressure 120/83, pulse 99, temperature 97.8 F (36.6 C), temperature source Oral, resp. rate 17, height  5\' 9"  (1.753 m), weight 77.6 kg, SpO2 95 %.   Intake/Output Summary (Last 24 hours) at 09/16/2018 0825 Last data filed at 09/16/2018 0981 Gross per 24 hour  Intake 400 ml  Output -  Net 400 ml    Exam Awake Alert, Oriented x 3, No new F.N deficits, Normal affect [place].AT,PERRAL Supple Neck,No JVD, No cervical lymphadenopathy appriciated.  Symmetrical Chest wall movement, Good air movement bilaterally, CTAB RRR,No Gallops,Rubs or new Murmurs, No Parasternal Heave +ve B.Sounds, Abd Soft, Non tender, No organomegaly appriciated, No rebound -guarding or rigidity. No Cyanosis, Clubbing or edema, No new Rash or bruise   Data Review   CBC w Diff:  Lab Results  Component Value Date   WBC 10.4 09/16/2018   HGB 14.5 09/16/2018   HCT 45.4 09/16/2018   PLT 358 09/16/2018   LYMPHOPCT 5 09/12/2018   MONOPCT 2 09/12/2018   EOSPCT 1 09/12/2018   BASOPCT 0 09/12/2018    CMP:  Lab Results  Component Value Date   NA 140 09/16/2018   K 3.2 (L) 09/16/2018   CL 105 09/16/2018   CO2 28 09/16/2018   BUN 14 09/16/2018   CREATININE 1.32 (H) 09/16/2018   PROT 4.7 (L) 09/16/2018   ALBUMIN 2.5 (  L) 09/16/2018   BILITOT 1.4 (H) 09/16/2018   ALKPHOS 87 09/16/2018   AST 88 (H) 09/16/2018   ALT 176 (H) 09/16/2018  .   Total Time in preparing paper work, data evaluation and todays exam - 35 minutes  Susa Raring M.D on 09/16/2018 at 8:25 AM  [facility]   Office  [phone]

## 2018-09-16 NOTE — Progress Notes (Addendum)
Progress Note  Patient Name: Daryl [name] Date of Encounter: 09/16/2018  Primary Cardiologist: Rollene Rotunda, MD   Subjective   No complaints this morning. No post cath complications. Radial access site is stable. No CP or dyspnea.   Inpatient Medications    Scheduled Meds: . ALPRAZolam  0.25 mg Oral Once  . aspirin  81 mg Oral Daily  . atorvastatin  40 mg Oral q1800  . carvedilol  3.125 mg Oral BID WC  . cefdinir  300 mg Oral Q12H  . chlordiazePOXIDE  15 mg Oral TID  . doxycycline  100 mg Oral Q12H  . folic acid  1 mg Oral Daily  . furosemide  40 mg Oral Daily  . heparin injection (subcutaneous)  5,000 Units Subcutaneous Q8H  . isosorbide-hydrALAZINE  1 tablet Oral BID  . LORazepam  0-4 mg Intravenous Q12H  . multivitamin with minerals  1 tablet Oral Daily  . pneumococcal 23 valent vaccine  0.5 mL Intramuscular Tomorrow-1000  . predniSONE  20 mg Oral Q breakfast  . sodium chloride flush  3 mL Intravenous Q12H  . thiamine  100 mg Oral Daily   Or  . thiamine  100 mg Intravenous Daily   Continuous Infusions: . sodium chloride     PRN Meds: sodium chloride, acetaminophen, albuterol, ipratropium-albuterol, lidocaine, LORazepam **OR** LORazepam, sodium chloride flush   Vital Signs    Vitals:   09/16/18 0057 09/16/18 0214 09/16/18 0456 09/16/18 0641  BP: 95/61  112/72 120/83  Pulse: 100  98 99  Resp:   17   Temp: 97.6 F (36.4 C)  97.8 F (36.6 C)   TempSrc: Oral  Oral   SpO2: 96%  96% 95%  Weight:  77.6 kg    Height:        Intake/Output Summary (Last 24 hours) at 09/16/2018 0349 Last data filed at 09/15/2018 1734 Gross per 24 hour  Intake 280 ml  Output -  Net 280 ml   Last 3 Weights 09/16/2018 09/11/2018 01/16/2012  Weight (lbs) 171 lb 1.2 oz 172 lb 2.9 oz 167 lb 6.4 oz  Weight (kg) 77.6 kg 78.1 kg 75.932 kg      Telemetry    Not currently on tele - Personally Reviewed  ECG    Not performed today- Personally Reviewed  Physical Exam   GEN: thin  middle aged AAM in No acute distress.   Neck: No JVD Cardiac: RRR, no murmurs, rubs, or gallops.  Respiratory: Clear to auscultation bilaterally. GI: Soft, nontender, non-distended  MS: No edema; No deformity. Neuro:  Nonfocal  Psych: Normal affect   Labs    Chemistry Recent Labs  Lab 09/14/18 0517 09/15/18 0441  09/15/18 0924 09/15/18 0926 09/16/18 0401  NA 139 142   < > 148* 144 140  K 4.1 3.8   < > 3.9 3.8 3.2*  CL 105 109  --   --   --  105  CO2 26 26  --   --   --  28  GLUCOSE 118* 111*  --   --   --  122*  BUN 27* 18  --   --   --  14  CREATININE 1.62* 1.40*  --   --   --  1.32*  CALCIUM 8.2* 8.2*  --   --   --  7.7*  PROT 4.6* 4.6*  --   --   --  4.7*  ALBUMIN 2.6* 2.6*  --   --   --  2.5*  AST 150* 106*  --   --   --  88*  ALT 201* 186*  --   --   --  176*  ALKPHOS 95 88  --   --   --  87  BILITOT 0.5 1.0  --   --   --  1.4*  GFRNONAA 47* 56*  --   --   --  60*  GFRAA 54* >60  --   --   --  >60  ANIONGAP 8 7  --   --   --  7   < > = values in this interval not displayed.     Hematology Recent Labs  Lab 09/14/18 0517 09/15/18 0441  09/15/18 0924 09/15/18 0926 09/16/18 0401  WBC 21.1* 18.3*  --   --   --  10.4  RBC 5.00 5.09  --   --   --  5.17  HGB 13.8 14.2   < > 16.3 15.6 14.5  HCT 42.4 44.3   < > 48.0 46.0 45.4  MCV 84.8 87.0  --   --   --  87.8  MCH 27.6 27.9  --   --   --  28.0  MCHC 32.5 32.1  --   --   --  31.9  RDW 13.4 13.6  --   --   --  13.8  PLT 301 324  --   --   --  358   < > = values in this interval not displayed.    Cardiac Enzymes Recent Labs  Lab 09/11/18 2009 09/12/18 0303 09/12/18 0925 09/12/18 1620  TROPONINI 0.04* 0.04* 0.07* 0.12*   No results for input(s): TROPIPOC in the last 168 hours.   BNP Recent Labs  Lab 09/11/18 2009 09/13/18 0753  BNP 1,771.8* 2,119.4*     DDimer No results for input(s): DDIMER in the last 168 hours.   Radiology    Dg Chest Port 1 View  Result Date: 09/15/2018 CLINICAL DATA:   Shortness of breath EXAM: PORTABLE CHEST 1 VIEW COMPARISON:  09/13/2018 FINDINGS: Cardiac shadow remains enlarged. The lungs are well aerated bilaterally. Mild bronchitic changes are again seen bilaterally without focal confluent infiltrate. No bony abnormality is noted. IMPRESSION: Mild bronchitic changes stable from the previous exam. Electronically Signed   By: Alcide Clever M.D.   On: 09/15/2018 07:57    Cardiac Studies   ECHO: 09/13/18:  Study Conclusions  - Left ventricle: The cavity size was mildly dilated. Wall thickness was normal. The estimated ejection fraction was 20%. Diffuse hypokinesis. Doppler parameters are consistent with restrictive physiology, indicative of decreased left ventricular diastolic compliance and/or increased left atrial pressure. - Aortic valve: There was no stenosis. - Mitral valve: There was moderate to severe central regurgitation, probably functional. - Left atrium: The atrium was severely dilated. - Right ventricle: The cavity size was mildly dilated. Systolic function was mildly reduced. - Right atrium: The atrium was moderately dilated. - Tricuspid valve: There was moderate-severe regurgitation. Visually looks moderate but there is systolic flow reversal in the hepatic vein doppler pattern. Peak RV-RA gradient (S): 25 mm Hg. - Pulmonary arteries: PA peak pressure: 40 mm Hg (S). - Systemic veins: IVC measured 2.25 cm with < 50% respirophasic variation, suggesting RA pressure 15 mmHg.  Impressions:  - Mildly dilated LV with severe diffuse hypokinesis, EF 20%. Restrictive diastolic function. Mildly dilated RV with mildly decreased systolic function. Biatrial enlargement. Moderate-severe central MR, probably function. Moderate-severe TR. Mild pulmonary hypertension.  Dilated IVC suggestive of elevated RV filling presure.   Right and left heart cardiac catheterization 09/15/18: Procedures   RIGHT/LEFT HEART  CATH AND CORONARY ANGIOGRAPHY  Conclusion     Hemodynamic findings consistent with mild pulmonary hypertension.  LV end diastolic pressure is mildly elevated.  Prox RCA to Mid RCA lesion is 15% stenosed.  Mid RCA lesion is 20% stenosed.  Prox LAD lesion is 20% stenosed.  Dist RCA lesion is 20% stenosed with 30% stenosed side branch in Ost RPDA.   SUMMARY  Angiographic evidence of coronary spasm relieved with nitroglycerin in the mid and distal RCA otherwise minimal disease in the LAD.  NONISCHEMIC CARDIOMYOPATHY  Evidence of elevated right-sided filling pressures with an RAP of 14 mmHg and RVEDP of 17 million mercury consistent with LVEDP of 17-20 mmHg.  RECOMMENDATIONS  Patient will return to nursing unit after TR band removal.  Continue cardiomyopathy treatment regimen.  Appears to be essentially euvolemic by cath.      Patient Profile     Barnet GlasgowJoel L Mooreis a 58 y.o.malewith a hx of asthma andongoingtobacco abusewith no prior hx of CADwho is being seen today for the evaluation ofelevated troponinand new LV dysfunctionat the request ofDr. Thedore MinsSingh.Note, pt is also being treated for URI.  Assessment & Plan    1. Acute CHF: treated w/ IV diuretics. Cardiac cath conducted yesterday and pt appeared to be essentially euvolemic by cath measurements. Given severity of LV dysfunction, he will need PO diuretics on discharge. Advise good medication compliance and low salt diet to reduce risk for re exacerbation/ readmission. Daily weights at home will also be helpful to monitor volume status. We discussed this today.   2. Nonischemic Cardiomyopathy: EF 20% by echo with moderate to severe MR. 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 Plan is for guidelines directed medical therapy. D/c home on Coreg and Bidil. Now that his renal function has improved, we should also consider an ARB/ Entresto and spironolactone. ? Compliance however given his history. If he is  compliant w/ clinic f/u and medications, will plan to titrate meds and repeat echo 3 months after maximum tolerated titration. If EF remains < 35%, will refer to EP for consideration for ICD.   3. Stage III CKD: renal function continues to improve. SCr down to 1.3 today. Consider addition of ARB given systolic HF. We can get f/u BMP in clinic.  4. CAD: LHC showed mild, nonobstructive, CAD as outlined in cath report above. Recommend statin for prevention of disease progression and plaque stabilization, however doubt pt will remain compliant.    5. Right Pleural Effusion: in the setting of CHF. S/p therapeutic thoracentesis.   6. Tobacco Abuse: smoking cessation advised.   7. Elevated Troponin: 0.04>>0.07>>0.12. LHC w/ nonobstructive dz. Suspect demand ischemia 2/2 CHF.   8. NSVT: in the setting of low EF. Has been asymptomatic. Plan to d/c home on  blocker.   9. Hypokalemia: 3.2 today. Needs supplemental K.    Dispo: Post hospital f/u has been arranged in our clinic 1/30. Appt info in AVS.   For questions or updates, please contact [facility] Please consult [email or web address] for contact info under        Signed, Robbie LisBrittainy Errica Dutil, PA-C  09/16/2018, 7:18 AM

## 2018-09-16 NOTE — Care Management Note (Addendum)
Case Management Note  Patient Details  Name: Daryl [name] MRN: [id number] Date of Birth: [birth year]  Subjective/Objective:   Presents with cough,fever, SOB. Hx of asthma, tobacco abuse.          Lytle Michaels (Mother) Jenna Luo (Significant other)      [phone]          Action/Plan: Transition to home today.  Pt jobless without health insurance/ PCP. NCM scheduled hospital f/u with [facility], noted on AVS. MATCH LETTER given to assist with medication needs. Pt to pick up filled Rx meds from [facility]. Pt states has transportation to home.  Expected Discharge Date:  09/16/18               Expected Discharge Plan:  Home/Self Care  In-House Referral:  NA  Discharge planning Services  CM Consult, Indigent [facility] Clinic, [facility]  Post Acute Care Choice:  NA Choice offered to:  NA  DME Arranged:  N/A DME Agency:  NA  HH Arranged:  NA HH Agency:  NA  Status of Service:  Completed, signed off  If discussed at Long Length of Stay Meetings, dates discussed:    Additional Comments:  Epifanio Lesches, RN 09/16/2018, 9:02 AM

## 2018-09-16 NOTE — Progress Notes (Signed)
Informed patient that requested letter (to excuse him from court) had been given to his son to give to his public defender and that a second attempt to fax letter was completed during the day shift (first attempt on 1/21; see prior RN note).   Patient expressed concerns about improvement in heart function after completion of cardiac cath and what medications he would receive upon discharge. Encouraged patient to discuss concerns about what follow-up testing/labs or appointments may be necessary to assess heart function with MD. Informed patient MD Thedore Mins and day nurse will review discharge instructions with him upon discharge orders and receiving discharge paperwork.

## 2018-09-16 NOTE — Discharge Instructions (Signed)
Follow with Primary MD in 7 days   Get CBC, CMP, 2 view Chest X ray -  checked  by Primary MD in 5-7 days   Activity: As tolerated with Full fall precautions use walker/cane & assistance as needed  Disposition Home    Diet: Heart Healthy with 1.2 lit/day fluid restriction  Special Instructions: If you have smoked or chewed Tobacco  in the last 2 yrs please stop smoking, stop any regular Alcohol  and or any Recreational drug use.  On your next visit with your primary care physician please Get Medicines reviewed and adjusted.  Please request your Prim.MD to go over all Hospital Tests and Procedure/Radiological results at the follow up, please get all Hospital records sent to your Prim MD by signing hospital release before you go home.  If you experience worsening of your admission symptoms, develop shortness of breath, life threatening emergency, suicidal or homicidal thoughts you must seek medical attention immediately by calling 911 or calling your MD immediately  if symptoms less severe.  You Must read complete instructions/literature along with all the possible adverse reactions/side effects for all the Medicines you take and that have been prescribed to you. Take any new Medicines after you have completely understood and accpet all the possible adverse reactions/side effects.

## 2018-09-18 LAB — CULTURE, BODY FLUID W GRAM STAIN -BOTTLE: Culture: NO GROWTH

## 2018-09-22 NOTE — Progress Notes (Deleted)
Cardiology Office Note   Date:  09/22/2018   ID:  Daryl [name], DOB May 28, 1961, MRN [id number]  PCP:  Patient, No Pcp Per  Cardiologist: Hochrein  No chief complaint on file.    History of Present Illness: Daryl [name] is a 58 y.o. male who presents for post hospital follow up after admission for progressive coughing dyspnea and fever with chills. He was treated for pneumonia. He has a history of asthma, ETOH and tobacco abuse. He was diagnosed with acute hypoxic respiratory failure in the setting of rhinovirus infection with superimposed bacterial infection.   He was noted to have frequent V-tach runs, with echocardiogram revealing EF of 20% with diffuse hypokinesis. He was started on Coreg, BiDil, ASA, lasix, aldactone and statin.No ACE or ARB was started due to CKD with baseline creatinine of 1.5..    He underwent cardiac cath due to elevated troponin and reduced EF. He was not found to have significant CAD, and diagnosed with NICM with etiology unclear, possibly from viral myocarditis or ETOH abuse.   Due to free flowing right pleural effusion he had an ultrasound-guided thoracentesis, with removal of 1 liter of transudative fluid.  Pathology revealed no growth after 5 days.    Past Medical History:  Diagnosis Date  . Asthma     Past Surgical History:  Procedure Laterality Date  . IR THORACENTESIS ASP PLEURAL SPACE W/IMG GUIDE  09/13/2018  . RIGHT/LEFT HEART CATH AND CORONARY ANGIOGRAPHY N/A 09/15/2018   Procedure: RIGHT/LEFT HEART CATH AND CORONARY ANGIOGRAPHY;  Surgeon: Daryl Lex, MD;  Location: [facility] INVASIVE CV LAB;  Service: Cardiovascular;  Laterality: N/A;     Current Outpatient Medications  Medication Sig Dispense Refill  . aspirin 81 MG chewable tablet Chew 1 tablet (81 mg total) by mouth daily. 30 tablet 0  . atorvastatin (LIPITOR) 40 MG tablet Take 1 tablet (40 mg total) by mouth daily at 6 PM. 30 tablet 0  . carvedilol (COREG) 3.125 MG tablet Take 1 tablet (3.125  mg total) by mouth 2 (two) times daily with a meal. 30 tablet 0  . folic acid (FOLVITE) 1 MG tablet Take 1 tablet (1 mg total) by mouth daily. 30 tablet 0  . furosemide (LASIX) 40 MG tablet Take 1 tablet (40 mg total) by mouth daily. 30 tablet 0  . isosorbide-hydrALAZINE (BIDIL) 20-37.5 MG tablet Take 1 tablet by mouth 2 (two) times daily. 60 tablet 0  . spironolactone (ALDACTONE) 25 MG tablet Take 0.5 tablets (12.5 mg total) by mouth daily for 30 days. 15 tablet 0  . thiamine 100 MG tablet Take 1 tablet (100 mg total) by mouth daily. 30 tablet 0   No current facility-administered medications for this visit.     Allergies:   Penicillins    Social History:  The patient  reports that he has been smoking. He has been smoking about 0.50 packs per day. He has never used smokeless tobacco. He reports current alcohol use.   Family History:  The patient's family history is not on file.    ROS: All other systems are reviewed and negative. Unless otherwise mentioned in H&P    PHYSICAL EXAM: VS:  There were no vitals taken for this visit. , BMI There is no height or weight on file to calculate BMI. GEN: Well nourished, well developed, in no acute distress HEENT: normal Neck: no JVD, carotid bruits, or masses Cardiac: ***RRR; no murmurs, rubs, or gallops,no edema  Respiratory:  Clear to auscultation bilaterally, normal  work of breathing GI: soft, nontender, nondistended, + BS MS: no deformity or atrophy Skin: warm and dry, no rash Neuro:  Strength and sensation are intact Psych: euthymic mood, full affect   EKG:  EKG {ACTION; IS/IS [id number]} ordered today. The ekg ordered today demonstrates ***   Recent Labs: 09/13/2018: B Natriuretic Peptide 1,876.5 09/16/2018: ALT 176; BUN 14; Creatinine, Ser 1.32; Hemoglobin 14.5; Magnesium 2.0; Platelets 358; Potassium 3.2; Sodium 140    Lipid Panel    Component Value Date/Time   CHOL 127 09/15/2018 0441   TRIG 124 09/15/2018 0441   HDL 33  (L) 09/15/2018 0441   CHOLHDL 3.8 09/15/2018 0441   VLDL 25 09/15/2018 0441   LDLCALC 69 09/15/2018 0441      Wt Readings from Last 3 Encounters:  09/16/18 171 lb 1.2 oz (77.6 kg)  01/16/12 167 lb 6.4 oz (75.9 kg)      Other studies Reviewed:  Hemodynamic findings consistent with mild pulmonary hypertension.  LV end diastolic pressure is mildly elevated.  Prox RCA to Mid RCA lesion is 15% stenosed.  Mid RCA lesion is 20% stenosed.  Prox LAD lesion is 20% stenosed.  Dist RCA lesion is 20% stenosed with 30% stenosed side branch in Ost RPDA.   Summary  Angiographic evidence of coronary spasm relieved with nitroglycerin in the mid and distal RCA otherwise minimal disease in the LAD.  NONISCHEMIC CARDIOMYOPATHY  Evidence of elevated right-sided filling pressures with an RAP of 14 mmHg and RVEDP of 17 million mercury consistent with LVEDP of 17-20 mmHg. Left ventricle: The cavity size was mildly dilated. Wall   thickness was normal. The estimated ejection fraction was 20%.   Diffuse hypokinesis. Doppler parameters are consistent with   restrictive physiology, indicative of decreased left ventricular   diastolic compliance and/or increased left atrial pressure. - Aortic valve: There was no stenosis. - Mitral valve: There was moderate to severe central regurgitation,   probably functional. - Left atrium: The atrium was severely dilated. - Right ventricle: The cavity size was mildly dilated. Systolic   function was mildly reduced. - Right atrium: The atrium was moderately dilated. - Tricuspid valve: There was moderate-severe regurgitation.   Visually looks moderate but there is systolic flow reversal in   the hepatic vein doppler pattern. Peak RV-RA gradient (S): 25 mm   Hg. - Pulmonary arteries: PA peak pressure: 40 mm Hg (S). - Systemic veins: IVC measured 2.25 cm with < 50% respirophasic   variation, suggesting RA pressure 15 mmHg.  Echocardiogram 09/21/2018 - Mildly  dilated LV with severe diffuse hypokinesis, EF 20%.   Restrictive diastolic function. Mildly dilated RV with mildly   decreased systolic function. Biatrial enlargement.   Moderate-severe central MR, probably function. Moderate-severe   TR. Mild pulmonary hypertension. Dilated IVC suggestive of   elevated RV filling presure.  ASSESSMENT AND PLAN:  1.  ***   Current medicines are reviewed at length with the patient today.    Labs/ tests ordered today include: *** Bettey Mare. Liborio Nixon, ANP, AACC   09/22/2018 1:22 PM    [facility] [address] Office [phone] Fax [phone]

## 2018-09-23 ENCOUNTER — Ambulatory Visit: Payer: Self-pay | Admitting: Adult Health

## 2018-09-27 NOTE — Progress Notes (Signed)
Cardiology Office Note   Date:  09/28/2018   ID:  KINZER HORNEY, DOB 02/02/1961, MRN [id number]  PCP:  Patient, No Pcp Per  Cardiologist:  Hochrein  Chief Complaint  Patient presents with  . Follow-up    R/L HEART CATH  . Cardiomyopathy     History of Present Illness: Daryl [name] is a 58 y.o. male who presents for ongoing assessment and management post hospitalization where he was seen on consultation by cardiology. He was admitted with sepsis and acute hypoxic respiratory failure in the setting of rhinovirus infection, superimposed on bacterial infection.   The consultation was requested in the setting of cardiomyopathy, after abnormal echocardiogram with EF of 20%, along with abnormal cardiac CT revealing multiple coronary calcifications.   He underwent a RHC/LHC revealed non-obstructive CAD with no more than 20% stenosis in any of his coronary arteries. He had moderate to severe MR. There was evidence of coronary spasm relieved with NTG. 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 He was placed on Bidil and lasix. No ACE or ARB due to CKD.   He comes today confused about his medications as the tops of his bottles came off, which were marked as to "AM or PM" to help to him know when to take it. He denies recurrent chest pain or DOE. His energy level has not returned. He occasionally continues to drink a beer. He denies heavy drinking.   Past Medical History:  Diagnosis Date  . Asthma     Past Surgical History:  Procedure Laterality Date  . IR THORACENTESIS ASP PLEURAL SPACE W/IMG GUIDE  09/13/2018  . RIGHT/LEFT HEART CATH AND CORONARY ANGIOGRAPHY N/A 09/15/2018   Procedure: RIGHT/LEFT HEART CATH AND CORONARY ANGIOGRAPHY;  Surgeon: Marykay Lex, MD;  Location: [facility] INVASIVE CV LAB;  Service: Cardiovascular;  Laterality: N/A;     Current Outpatient Medications  Medication Sig Dispense Refill  . aspirin 81 MG chewable tablet Chew 1 tablet (81 mg total) by mouth  daily. 30 tablet 0  . atorvastatin (LIPITOR) 40 MG tablet Take 1 tablet (40 mg total) by mouth daily at 6 PM. 30 tablet 6  . carvedilol (COREG) 3.125 MG tablet Take 1 tablet (3.125 mg total) by mouth 2 (two) times daily with a meal. 30 tablet 6  . folic acid (FOLVITE) 1 MG tablet Take 1 tablet (1 mg total) by mouth daily. 30 tablet 0  . furosemide (LASIX) 40 MG tablet Take 1 tablet (40 mg total) by mouth daily. 30 tablet 6  . isosorbide-hydrALAZINE (BIDIL) 20-37.5 MG tablet Take 1 tablet by mouth 2 (two) times daily. 60 tablet 6  . spironolactone (ALDACTONE) 25 MG tablet Take 0.5 tablets (12.5 mg total) by mouth daily for 30 days. 15 tablet 6  . thiamine 100 MG tablet Take 1 tablet (100 mg total) by mouth daily. 30 tablet 6   No current facility-administered medications for this visit.     Allergies:   Penicillins    Social History:  The patient  reports that he has been smoking. He has been smoking about 0.50 packs per day. He has never used smokeless tobacco. He reports current alcohol use.   Family History:  The patient's family history is not on file.    ROS: All other systems are reviewed and negative. Unless otherwise mentioned in H&P    PHYSICAL EXAM: VS:  BP 102/63 (BP Location: Left Arm, Patient Position: Sitting, Cuff Size: Normal)   Pulse 95  Ht 5\' 9"  (1.753 m)   Wt 166 lb 12.8 oz (75.7 kg)   BMI 24.63 kg/m  , BMI Body mass index is 24.63 kg/m. GEN: Well nourished, well developed, in no acute distress HEENT: normal Neck: no JVD, carotid bruits, or masses Cardiac: RRR; distant heart sounds, 1/6 systolic murmurs, rubs, or gallops,no edema  Respiratory:  Clear to auscultation bilaterally, normal work of breathing GI: soft, nontender, nondistended, + BS MS: no deformity or atrophy Skin: warm and dry, no rash Neuro:  Strength and sensation are intact Psych: euthymic mood, full affect   EKG:  Not completed this office visit.   Recent Labs: September 25, 2018: B Natriuretic  Peptide 1,876.5 09/16/2018: ALT 176; BUN 14; Creatinine, Ser 1.32; Hemoglobin 14.5; Magnesium 2.0; Platelets 358; Potassium 3.2; Sodium 140    Lipid Panel    Component Value Date/Time   CHOL 127 09/15/2018 0441   TRIG 124 09/15/2018 0441   HDL 33 (L) 09/15/2018 0441   CHOLHDL 3.8 09/15/2018 0441   VLDL 25 09/15/2018 0441   LDLCALC 69 09/15/2018 0441      Wt Readings from Last 3 Encounters:  09/28/18 166 lb 12.8 oz (75.7 kg)  09/16/18 171 lb 1.2 oz (77.6 kg)  01/16/12 167 lb 6.4 oz (75.9 kg)      Other studies Reviewed: Echocardiogram September 25, 2018  Left ventricle: The cavity size was mildly dilated. Wall thickness was normal. The estimated ejection fraction was 20%. Diffuse hypokinesis. Doppler parameters are consistent with restrictive physiology, indicative of decreased left ventricular diastolic compliance and/or increased left atrial pressure. - Aortic valve: There was no stenosis. - Mitral valve: There was moderate to severe central regurgitation, probably functional. - Left atrium: The atrium was severely dilated. - Right ventricle: The cavity size was mildly dilated. Systolic function was mildly reduced. - Right atrium: The atrium was moderately dilated. - Tricuspid valve: There was moderate-severe regurgitation. Visually looks moderate but there is systolic flow reversal in the hepatic vein doppler pattern. Peak RV-RA gradient (S): 25 mm Hg. - Pulmonary arteries: PA peak pressure: 40 mm Hg (S). - Systemic veins: IVC measured 2.25 cm with < 50% respirophasic variation, suggesting RA pressure 15 mmHg.  Impressions:  - Mildly dilated LV with severe diffuse hypokinesis, EF 20%. Restrictive diastolic function. Mildly dilated RV with mildly decreased systolic function. Biatrial enlargement. Moderate-severe central MR, probably function. Moderate-severe TR. Mild pulmonary hypertension. Dilated IVC suggestive of elevated RV filling  presure.   Right and left heart cardiac catheterization 09/15/18: Procedures   RIGHT/LEFT HEART CATH AND CORONARY ANGIOGRAPHY  Conclusion     Hemodynamic findings consistent with mild pulmonary hypertension.  LV end diastolic pressure is mildly elevated.  Prox RCA to Mid RCA lesion is 15% stenosed.  Mid RCA lesion is 20% stenosed.  Prox LAD lesion is 20% stenosed.  Dist RCA lesion is 20% stenosed with 30% stenosed side branch in Ost RPDA.  SUMMARY  Angiographic evidence of coronary spasm relieved with nitroglycerin in the mid and distal RCA otherwise minimal disease in the LAD.  NONISCHEMIC CARDIOMYOPATHY  Evidence of elevated right-sided filling pressures with an RAP of 14 mmHg and RVEDP of 17 million mercury consistent with LVEDP of 17-20 mmHg.  RECOMMENDATIONS  Patient will return to nursing unit after TR band removal.  Continue cardiomyopathy treatment regimen.  Appears to be essentially euvolemic by cath.     ASSESSMENT AND PLAN:  1. NICM: EF of 20%. I have reviewed each of his medications with him, written on his medication sheet  their indication and whether they are to be taken am or pm. He gets his medication from [facility]. Possibly needs "pill pack" if they provide this to help him with his medication times.  I have also reviewed the need to be compliant and to avoid ETOH with these medications.  2. Hypertension:Excellent control currently. He has low normal BP with reduced EF. I have explained to him the need to keep BP lower as a result of his cardiomyopathy. He verbalizes understanding.   3. CKD: No ACE or ARB at this time. I will repeat BMET on next visit to ascertain his status.  4. ETOH Abuse: He is counseled on cessation and the worsening affects this will have on his heart function if he does not quit, to include severely reduced life expectancy. He verbalizes understanding.    Current medicines are reviewed at length  with the patient today.    Labs/ tests ordered today include: None  Bettey Mare. Liborio Nixon, ANP, AACC   09/28/2018 4:48 PM    [facility] [address] Office [phone] Fax [phone]

## 2018-09-28 ENCOUNTER — Ambulatory Visit (INDEPENDENT_AMBULATORY_CARE_PROVIDER_SITE_OTHER): Payer: Self-pay | Admitting: Adult Health

## 2018-09-28 ENCOUNTER — Encounter: Payer: Self-pay | Admitting: Adult Health

## 2018-09-28 VITALS — BP 102/63 | HR 95 | Ht 69.0 in | Wt 166.8 lb

## 2018-09-28 DIAGNOSIS — I10 Essential (primary) hypertension: Secondary | ICD-10-CM

## 2018-09-28 DIAGNOSIS — I43 Cardiomyopathy in diseases classified elsewhere: Secondary | ICD-10-CM

## 2018-09-28 DIAGNOSIS — F1010 Alcohol abuse, uncomplicated: Secondary | ICD-10-CM

## 2018-09-28 MED ORDER — SPIRONOLACTONE 25 MG PO TABS: 12.5000 mg | ORAL_TABLET | Freq: Every day | ORAL | 6 refills | Status: DC

## 2018-09-28 MED ORDER — ATORVASTATIN CALCIUM 40 MG PO TABS: 40.0000 mg | ORAL_TABLET | Freq: Every day | ORAL | 6 refills | Status: DC

## 2018-09-28 MED ORDER — ISOSORB DINITRATE-HYDRALAZINE 20-37.5 MG PO TABS: 1.0000 | ORAL_TABLET | Freq: Two times a day (BID) | ORAL | 6 refills | Status: DC

## 2018-09-28 MED ORDER — FUROSEMIDE 40 MG PO TABS: 40.0000 mg | ORAL_TABLET | Freq: Every day | ORAL | 6 refills | Status: DC

## 2018-09-28 MED ORDER — FOLIC ACID 1 MG PO TABS: 1.0000 mg | ORAL_TABLET | Freq: Every day | ORAL | 0 refills | Status: DC

## 2018-09-28 MED ORDER — CARVEDILOL 3.125 MG PO TABS: 3.1250 mg | ORAL_TABLET | Freq: Two times a day (BID) | ORAL | 6 refills | Status: DC

## 2018-09-28 MED ORDER — THIAMINE HCL 100 MG PO TABS: 100.0000 mg | ORAL_TABLET | Freq: Every day | ORAL | 6 refills | Status: DC

## 2018-09-28 NOTE — Patient Instructions (Signed)
Follow-Up: You will need a follow up appointment in 1 months.  Please call our office 2 months in advance to schedule this appointment.  You may see Rollene Rotunda, MD Joni Reining, DNP, AACC  or one of the following Advanced Practice Providers on your designated Care Team:  Joni Reining, DNP, AACC Theodore Demark, PA-C  Medication Instructions:  NO CHANGES- Your physician recommends that you continue on your current medications as directed. Please refer to the Current Medication list given to you today. If you need a refill on your cardiac medications before your next appointment, please call your pharmacy. Labwork: When you have labs (blood work) and your tests are completely normal, you will receive your results ONLY by MyChart Message (if you have MyChart) -OR- A paper copy in the mail.  At [facility], you and your health needs are our priority.  As part of our continuing mission to provide you with exceptional heart care, we have created designated Provider Care Teams.  These Care Teams include your primary Cardiologist (physician) and Advanced Practice Providers (APPs -  Physician Assistants and Nurse Practitioners) who all work together to provide you with the care you need, when you need it.  Thank you for choosing [facility] at [facility]!!

## 2018-09-30 MED FILL — CARVEDILOL 3.125 MG TABLET: 3.125 | 30 days supply | Qty: 60 | Fill #0

## 2018-09-30 MED FILL — SPIRONOLACTONE 25 MG TABLET: 25 | 30 days supply | Qty: 15 | Fill #0

## 2018-10-05 ENCOUNTER — Inpatient Hospital Stay: Payer: Self-pay | Admitting: Critical Care Medicine

## 2018-10-13 ENCOUNTER — Encounter: Payer: Self-pay | Admitting: Cardiology

## 2018-10-18 MED FILL — FOLIC ACID 1 MG TABS: 1 | 30 days supply | Qty: 30 | Fill #0

## 2018-10-22 ENCOUNTER — Other Ambulatory Visit: Payer: Self-pay

## 2018-10-22 ENCOUNTER — Other Ambulatory Visit: Payer: Self-pay | Admitting: Cardiology

## 2018-10-22 MED ORDER — ISOSORB DINITRATE-HYDRALAZINE 20-37.5 MG PO TABS: 1.0000 | ORAL_TABLET | Freq: Two times a day (BID) | ORAL | 3 refills | Status: DC

## 2018-10-22 MED ORDER — SPIRONOLACTONE 25 MG PO TABS: 12.5000 mg | ORAL_TABLET | Freq: Every day | ORAL | 11 refills | Status: DC

## 2018-10-22 MED ORDER — ATORVASTATIN CALCIUM 40 MG PO TABS: 40.0000 mg | ORAL_TABLET | Freq: Every day | ORAL | 3 refills | Status: DC

## 2018-10-22 MED ORDER — FUROSEMIDE 40 MG PO TABS: 40.0000 mg | ORAL_TABLET | Freq: Every day | ORAL | 3 refills | Status: DC

## 2018-10-22 MED FILL — !BIDIL TABLET: 20-37.5MG | 30 days supply | Qty: 60 | Fill #0

## 2018-10-22 MED FILL — ATORVASTATIN CALCIUM 40 MG: 40 | 30 days supply | Qty: 30 | Fill #0

## 2018-10-22 MED FILL — FUROSEMIDE 40 MG TAB: 40 | 30 days supply | Qty: 30 | Fill #0

## 2018-10-22 NOTE — Telephone Encounter (Signed)
New Message    *STAT* If patient is at the pharmacy, call can be transferred to refill team.   1. Which medications need to be refilled? (please list name of each medication and dose if known)  Isosorbide 20-37.5mg     Furosemide 40mg    Spironolactone 25 mg    Atorvastatin 40mg          2. Which pharmacy/location (including street and city if local pharmacy) is medication to be sent to? [facility] on [address]   3. Do they need a 30 day or 90 day supply? 30 or 90

## 2018-10-22 NOTE — Telephone Encounter (Signed)
Rx(s) sent to pharmacy electronically.  

## 2018-10-22 NOTE — Progress Notes (Signed)
RX sent in to the pharmacy at the [facility].

## 2018-10-22 NOTE — Telephone Encounter (Signed)
New Message    *STAT* If patient is at the pharmacy, call can be transferred to refill team.   1. Which medications need to be refilled? (please list name of each medication and dose if known) Spironolactone 25mg    2. Which pharmacy/location (including street and city if local pharmacy) [facility]  3. Do they need a 30 day or 90 day supply? 30 day supply

## 2018-10-29 MED FILL — SPIRONOLACTONE 25 MG TABLET: 25 | 30 days supply | Qty: 15 | Fill #0

## 2018-10-31 NOTE — Progress Notes (Signed)
Cardiology Office Note   Date:  11/02/2018   ID:  SEAMUS IVIE, DOB 04-08-1961, MRN [id number]  PCP:  Patient, No Pcp Per  Cardiologist:   Rollene Rotunda, MD   Chief Complaint  Patient presents with  . Chest Pain      History of Present Illness: Daryl [name] is a 58 y.o. male who presents for  follow up post sepsis and acute hypoxic respiratory failure in the setting of rhinovirus infection, superimposed on bacterial infection.   This was in Jan.  EF was 20%.  He had coronary calcification on CT.  He underwent a RHC/LHC revealed non-obstructive CAD with no more than 20% stenosis in any of his coronary arteries. He had moderate to severe MR. There was evidence of coronary spasm relieved with NTG. 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 He was placed on Bidil and lasix. No ACE or ARB due to CKD.  He returns for follow up.    He denies any new cardiovascular symptoms.  He does not have any shortness of breath, PND or orthopnea.  It sounds like he was having trouble getting his medications but he is on them currently.  He has a limited understanding of his disease process but he did remember that his ejection fraction was 20%.  He is having some atypical right upper chest discomfort occasionally but he thinks it is because of a salad that he ate.  He does not have any weight gain or swelling.  He is not having palpitations, presyncope or syncope.  He said no PND or orthopnea.   Past Medical History:  Diagnosis Date  . Asthma     Past Surgical History:  Procedure Laterality Date  . IR THORACENTESIS ASP PLEURAL SPACE W/IMG GUIDE  09/13/2018  . RIGHT/LEFT HEART CATH AND CORONARY ANGIOGRAPHY N/A 09/15/2018   Procedure: RIGHT/LEFT HEART CATH AND CORONARY ANGIOGRAPHY;  Surgeon: Marykay Lex, MD;  Location: [facility] INVASIVE CV LAB;  Service: Cardiovascular;  Laterality: N/A;     Current Outpatient Medications  Medication Sig Dispense Refill  . aspirin 81 MG  chewable tablet Chew 1 tablet (81 mg total) by mouth daily. 30 tablet 0  . atorvastatin (LIPITOR) 40 MG tablet Take 1 tablet (40 mg total) by mouth daily at 6 PM. 90 tablet 3  . folic acid (FOLVITE) 1 MG tablet Take 1 tablet (1 mg total) by mouth daily. 30 tablet 0  . furosemide (LASIX) 40 MG tablet Take 1 tablet (40 mg total) by mouth daily. 90 tablet 3  . isosorbide-hydrALAZINE (BIDIL) 20-37.5 MG tablet Take 1 tablet by mouth 2 (two) times daily. 180 tablet 3  . spironolactone (ALDACTONE) 25 MG tablet Take 0.5 tablets (12.5 mg total) by mouth daily. 15 tablet 11  . thiamine 100 MG tablet Take 1 tablet (100 mg total) by mouth daily. 30 tablet 6  . carvedilol (COREG) 6.25 MG tablet Take 1 tablet (6.25 mg total) by mouth 2 (two) times daily. 180 tablet 3   No current facility-administered medications for this visit.     Allergies:   Penicillins    ROS:  Please see the history of present illness.   Otherwise, review of systems are positive for none.   All other systems are reviewed and negative.    PHYSICAL EXAM: VS:  BP 140/90 (BP Location: Right Arm, Patient Position: Sitting, Cuff Size: Normal)   Pulse 88   Ht 5\' 9"  (1.753 m)   Wt 170  lb (77.1 kg)   BMI 25.10 kg/m  , BMI Body mass index is 25.1 kg/m. GENERAL:  Well appearing HEENT:  Pupils equal round and reactive, fundi not visualized, oral mucosa unremarkable NECK:  No jugular venous distention, waveform within normal limits, carotid upstroke brisk and symmetric, no bruits, no thyromegaly LYMPHATICS:  No cervical, inguinal adenopathy LUNGS:  Clear to auscultation bilaterally BACK:  No CVA tenderness CHEST:  Unremarkable HEART:  PMI not displaced or sustained,S1 and S2 within normal limits, no S3, no S4, no clicks, no rubs, no murmurs ABD:  Flat, positive bowel sounds normal in frequency in pitch, no bruits, no rebound, no guarding, no midline pulsatile mass, no hepatomegaly, no splenomegaly EXT:  2 plus pulses throughout, no  edema, no cyanosis no clubbing SKIN:  No rashes no nodules NEURO:  Cranial nerves II through XII grossly intact, motor grossly intact throughout PSYCH:  Cognitively intact, oriented to person place and time    EKG:  EKG is not ordered today.    Recent Labs: 09/13/2018: B Natriuretic Peptide 1,876.5 09/16/2018: ALT 176; BUN 14; Creatinine, Ser 1.32; Hemoglobin 14.5; Magnesium 2.0; Platelets 358; Potassium 3.2; Sodium 140    Lipid Panel    Component Value Date/Time   CHOL 127 09/15/2018 0441   TRIG 124 09/15/2018 0441   HDL 33 (L) 09/15/2018 0441   CHOLHDL 3.8 09/15/2018 0441   VLDL 25 09/15/2018 0441   LDLCALC 69 09/15/2018 0441      Wt Readings from Last 3 Encounters:  11/02/18 170 lb (77.1 kg)  09/28/18 166 lb 12.8 oz (75.7 kg)  09/16/18 171 lb 1.2 oz (77.6 kg)      Other studies Reviewed: Additional studies/ records that were reviewed today include:   None. Review of the above records demonstrates:    ASSESSMENT AND PLAN:   NICM: EF of 20%.  I again went over the plan of care but I think he has limited understanding.  I am going to titrate his medications by increasing his carvedilol to 6.25 mg twice daily.  He needs to bring his medications so that we can review them with him.  We talked about salt restriction.  At this point we need to continue med titration prior to considering further imaging.  I do think he has class I symptoms.  Hypertension:     This is being managed in the context of treating his CHF  CKD:    Creat as above. No change in therapy.  Once we have titrated his beta-blocker we might consider trying to challenge him with an ACE inhibitor or ARB/ARNI.  However, I would also like to understand his compliance and understanding prior to initiating these medications.  He would need to have close follow-up of his creatinine.  ETOH Abuse:    He says he drinks rarely.  He understands the potential interaction of cardiomyopathy and alcohol.   Current  medicines are reviewed at length with the patient today.  The patient does not have concerns regarding medicines.  The following changes have been made:  As above.   Labs/ tests ordered today include: None No orders of the defined types were placed in this encounter.    Disposition:   FU with APP in one month.     Signed, Rollene Rotunda, MD  11/02/2018 5:19 PM    [facility]

## 2018-11-02 ENCOUNTER — Ambulatory Visit (INDEPENDENT_AMBULATORY_CARE_PROVIDER_SITE_OTHER): Payer: Self-pay | Admitting: Cardiology

## 2018-11-02 ENCOUNTER — Encounter: Payer: Self-pay | Admitting: Cardiology

## 2018-11-02 VITALS — BP 140/90 | HR 88 | Ht 69.0 in | Wt 170.0 lb

## 2018-11-02 DIAGNOSIS — I428 Other cardiomyopathies: Secondary | ICD-10-CM

## 2018-11-02 DIAGNOSIS — N182 Chronic kidney disease, stage 2 (mild): Secondary | ICD-10-CM

## 2018-11-02 DIAGNOSIS — I10 Essential (primary) hypertension: Secondary | ICD-10-CM

## 2018-11-02 HISTORY — DX: Chronic kidney disease, stage 2 (mild): N18.2

## 2018-11-02 HISTORY — DX: Essential (primary) hypertension: I10

## 2018-11-02 MED ORDER — CARVEDILOL 6.25 MG PO TABS: 6.2500 mg | ORAL_TABLET | Freq: Two times a day (BID) | ORAL | 3 refills | Status: DC

## 2018-11-02 NOTE — Patient Instructions (Addendum)
Medication Instructions:  STOP- Carvedilol 3.125 mg  START- Carvedilol 6.25 mg one tablet twice a day  If you need a refill on your cardiac medications before your next appointment, please call your pharmacy.  Labwork: None Ordered   Testing/Procedures: None Ordered  Follow-Up: You will need a follow up appointment in 3 months.  Please call our office 2 months in advance to schedule this appointment.  You may see Rollene Rotunda, MD or one of the following Advanced Practice Providers on your designated Care Team:   Theodore Demark, PA-C . Joni Reining, DNP, ANP    , Joni Reining, DNP, AACC   At [facility], you and your health needs are our priority.  As part of our continuing mission to provide you with exceptional heart care, we have created designated Provider Care Teams.  These Care Teams include your primary Cardiologist (physician) and Advanced Practice Providers (APPs -  Physician Assistants and Nurse Practitioners) who all work together to provide you with the care you need, when you need it.  Thank you for choosing [facility] at [facility]!!

## 2018-11-08 MED FILL — CARVEDILOL 6.25 MG TABLET: 6.25 | 90 days supply | Qty: 180 | Fill #0

## 2018-11-17 ENCOUNTER — Other Ambulatory Visit: Payer: Self-pay

## 2018-11-17 MED ORDER — FOLIC ACID 1 MG PO TABS: 1.0000 mg | ORAL_TABLET | Freq: Every day | ORAL | 1 refills | Status: DC

## 2018-11-24 MED FILL — ATORVASTATIN CALCIUM 40 MG: 40 | 30 days supply | Qty: 30 | Fill #1

## 2018-11-24 MED FILL — FUROSEMIDE 40 MG TAB: 40 | 30 days supply | Qty: 30 | Fill #1

## 2018-11-24 MED FILL — BIDIL TABLET: 20-37.5 | 30 days supply | Qty: 60 | Fill #1

## 2018-11-24 MED FILL — SPIRONOLACTONE 25 MG TABLET: 25 | 30 days supply | Qty: 15 | Fill #1

## 2018-12-30 ENCOUNTER — Other Ambulatory Visit: Payer: Self-pay | Admitting: Adult Health

## 2019-01-14 MED FILL — SPIRONOLACTONE 25 MG TABLET: 25 | 30 days supply | Qty: 15 | Fill #2

## 2019-01-14 MED FILL — BIDIL TABLET: 20-37.5 | 30 days supply | Qty: 60 | Fill #2

## 2019-01-14 MED FILL — ATORVASTATIN CALCIUM 40 MG: 40 | 30 days supply | Qty: 30 | Fill #2

## 2019-01-14 MED FILL — FUROSEMIDE 40 MG TAB: 40 | 30 days supply | Qty: 30 | Fill #2

## 2019-03-14 MED FILL — SPIRONOLACTONE 25 MG TABLET: 25 | 30 days supply | Qty: 15 | Fill #3

## 2019-03-14 MED FILL — ATORVASTATIN CALCIUM 40 MG: 40 | 30 days supply | Qty: 30 | Fill #3

## 2019-03-14 MED FILL — CARVEDILOL 6.25 MG TABLET: 6.25 | 90 days supply | Qty: 180 | Fill #1

## 2019-03-14 MED FILL — BIDIL TABLET: 20-37.5 | 30 days supply | Qty: 60 | Fill #3

## 2019-03-14 MED FILL — FUROSEMIDE 40 MG TAB: 40 | 30 days supply | Qty: 30 | Fill #3

## 2019-05-08 NOTE — Progress Notes (Deleted)
Cardiology Office Note   Date:  05/08/2019   ID:  [name], DOB May 01, 1961, MRN [id number]  PCP:  Patient, No Pcp Per  Cardiologist:   Rollene Rotunda, MD   No chief complaint on file.     History of Present Illness: Daryl [name] is a 58 y.o. male who presents for  follow up post sepsis and acute hypoxic respiratory failure in the setting of rhinovirus infection, superimposed on bacterial infection.   This was in Jan.  EF was 20%.  He had coronary calcification on CT.  He underwent a RHC/LHC revealed non-obstructive CAD with no more than 20% stenosis in any of his coronary arteries. He had moderate to severe MR. There was evidence of coronary spasm relieved with NTG. 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 He was placed on Bidil and lasix. No ACE or ARB due to CKD.  He returns for follow up.  At the last visit I increased the beta blocker.    ***  He denies any new cardiovascular symptoms.  He does not have any shortness of breath, PND or orthopnea.  It sounds like he was having trouble getting his medications but he is on them currently.  He has a limited understanding of his disease process but he did remember that his ejection fraction was 20%.  He is having some atypical right upper chest discomfort occasionally but he thinks it is because of a salad that he ate.  He does not have any weight gain or swelling.  He is not having palpitations, presyncope or syncope.  He said no PND or orthopnea.   Past Medical History:  Diagnosis Date  . Asthma     Past Surgical History:  Procedure Laterality Date  . IR THORACENTESIS ASP PLEURAL SPACE W/IMG GUIDE  09/13/2018  . RIGHT/LEFT HEART CATH AND CORONARY ANGIOGRAPHY N/A 09/15/2018   Procedure: RIGHT/LEFT HEART CATH AND CORONARY ANGIOGRAPHY;  Surgeon: Marykay Lex, MD;  Location: [facility] INVASIVE CV LAB;  Service: Cardiovascular;  Laterality: N/A;     Current Outpatient Medications  Medication Sig Dispense  Refill  . aspirin 81 MG chewable tablet Chew 1 tablet (81 mg total) by mouth daily. 30 tablet 0  . atorvastatin (LIPITOR) 40 MG tablet Take 1 tablet (40 mg total) by mouth daily at 6 PM. 90 tablet 3  . carvedilol (COREG) 6.25 MG tablet Take 1 tablet (6.25 mg total) by mouth 2 (two) times daily. 180 tablet 3  . folic acid (FOLVITE) 1 MG tablet TAKE 1 TABLET (1 MG TOTAL) BY MOUTH DAILY. 30 tablet 0  . furosemide (LASIX) 40 MG tablet Take 1 tablet (40 mg total) by mouth daily. 90 tablet 3  . isosorbide-hydrALAZINE (BIDIL) 20-37.5 MG tablet Take 1 tablet by mouth 2 (two) times daily. 180 tablet 3  . spironolactone (ALDACTONE) 25 MG tablet Take 0.5 tablets (12.5 mg total) by mouth daily. 15 tablet 11  . thiamine 100 MG tablet Take 1 tablet (100 mg total) by mouth daily. 30 tablet 6   No current facility-administered medications for this visit.     Allergies:   Penicillins    ROS:  Please see the history of present illness.   Otherwise, review of systems are positive for ***.   All other systems are reviewed and negative.    PHYSICAL EXAM: VS:  There were no vitals taken for this visit. , BMI There is no height or weight on file to calculate BMI.  GENERAL:  Well appearing NECK:  No jugular venous distention, waveform within normal limits, carotid upstroke brisk and symmetric, no bruits, no thyromegaly LUNGS:  Clear to auscultation bilaterally CHEST:  Unremarkable HEART:  PMI not displaced or sustained,S1 and S2 within normal limits, no S3, no S4, no clicks, no rubs, *** murmurs ABD:  Flat, positive bowel sounds normal in frequency in pitch, no bruits, no rebound, no guarding, no midline pulsatile mass, no hepatomegaly, no splenomegaly EXT:  2 plus pulses throughout, no edema, no cyanosis no clubbing    ***GENERAL:  Well appearing HEENT:  Pupils equal round and reactive, fundi not visualized, oral mucosa unremarkable NECK:  No jugular venous distention, waveform within normal limits, carotid  upstroke brisk and symmetric, no bruits, no thyromegaly LYMPHATICS:  No cervical, inguinal adenopathy LUNGS:  Clear to auscultation bilaterally BACK:  No CVA tenderness CHEST:  Unremarkable HEART:  PMI not displaced or sustained,S1 and S2 within normal limits, no S3, no S4, no clicks, no rubs, no murmurs ABD:  Flat, positive bowel sounds normal in frequency in pitch, no bruits, no rebound, no guarding, no midline pulsatile mass, no hepatomegaly, no splenomegaly EXT:  2 plus pulses throughout, no edema, no cyanosis no clubbing SKIN:  No rashes no nodules NEURO:  Cranial nerves II through XII grossly intact, motor grossly intact throughout PSYCH:  Cognitively intact, oriented to person place and time    EKG:  EKG *** ordered today. ***   Recent Labs: 09/13/2018: B Natriuretic Peptide 1,876.5 09/16/2018: ALT 176; BUN 14; Creatinine, Ser 1.32; Hemoglobin 14.5; Magnesium 2.0; Platelets 358; Potassium 3.2; Sodium 140    Lipid Panel    Component Value Date/Time   CHOL 127 09/15/2018 0441   TRIG 124 09/15/2018 0441   HDL 33 (L) 09/15/2018 0441   CHOLHDL 3.8 09/15/2018 0441   VLDL 25 09/15/2018 0441   LDLCALC 69 09/15/2018 0441      Wt Readings from Last 3 Encounters:  11/02/18 170 lb (77.1 kg)  09/28/18 166 lb 12.8 oz (75.7 kg)  09/16/18 171 lb 1.2 oz (77.6 kg)      Other studies Reviewed: Additional studies/ records that were reviewed today include: *** Review of the above records demonstrates:    ASSESSMENT AND PLAN:   NICM: EF of 20%. ***   I again went over the plan of care but I think he has limited understanding.  I am going to titrate his medications by increasing his carvedilol to 6.25 mg twice daily.  He needs to bring his medications so that we can review them with him.  We talked about salt restriction.  At this point we need to continue med titration prior to considering further imaging.  I do think he has class I symptoms.  Hypertension:   ***  This is being  managed in the context of treating his CHF  CKD:    ***  Creat as above. No change in therapy.  Once we have titrated his beta-blocker we might consider trying to challenge him with an ACE inhibitor or ARB/ARNI.  However, I would also like to understand his compliance and understanding prior to initiating these medications.  He would need to have close follow-up of his creatinine.  ETOH Abuse:    ***  He says he drinks rarely.  He understands the potential interaction of cardiomyopathy and alcohol.   Current medicines are reviewed at length with the patient today.  The patient does not have concerns regarding medicines.  The following changes  have been made:  ***   Labs/ tests ordered today include: ***  No orders of the defined types were placed in this encounter.    Disposition:   FU with ***.     Signed, Minus Breeding, MD  05/08/2019 2:37 PM    [facility]

## 2019-05-09 ENCOUNTER — Ambulatory Visit: Payer: Self-pay | Admitting: Cardiology

## 2019-05-09 MED FILL — BIDIL TABLET: 20-37.5 | 30 days supply | Qty: 60 | Fill #4

## 2019-05-09 MED FILL — FUROSEMIDE 40 MG TAB: 40 | 30 days supply | Qty: 30 | Fill #4

## 2019-05-09 MED FILL — ATORVASTATIN CALCIUM 40 MG: 40 | 30 days supply | Qty: 30 | Fill #4

## 2019-05-09 MED FILL — SPIRONOLACTONE 25 MG TABLET: 25 | 30 days supply | Qty: 15 | Fill #4

## 2019-05-25 MED FILL — BIDIL TABLET: 20-37.5 | 30 days supply | Qty: 60 | Fill #4

## 2019-05-25 MED FILL — FOLIC ACID 1 MG TABS: 1 | 30 days supply | Qty: 30 | Fill #0

## 2019-05-25 MED FILL — ATORVASTATIN CALCIUM 40 MG: 40 | 30 days supply | Qty: 30 | Fill #4

## 2019-05-25 MED FILL — FUROSEMIDE 40 MG TAB: 40 | 30 days supply | Qty: 30 | Fill #4

## 2019-05-25 MED FILL — SPIRONOLACTONE 25 MG TABLET: 25 | 30 days supply | Qty: 15 | Fill #4

## 2019-08-01 MED FILL — FUROSEMIDE 40 MG TAB: 40 | 30 days supply | Qty: 30 | Fill #5

## 2019-08-01 MED FILL — FOLIC ACID 1 MG TABS: 1 | 30 days supply | Qty: 30 | Fill #0

## 2019-08-01 MED FILL — ATORVASTATIN CALCIUM 40 MG: 40 | 30 days supply | Qty: 30 | Fill #5

## 2019-08-01 MED FILL — BIDIL 20-37.5 MG TABS: 20-37.5 | 30 days supply | Qty: 60 | Fill #5

## 2019-08-01 MED FILL — CARVEDILOL 6.25 MG TABLET: 6.25 | 90 days supply | Qty: 180 | Fill #2

## 2019-08-01 MED FILL — SPIRONOLACTONE 25 MG TABLET: 25 | 30 days supply | Qty: 15 | Fill #5

## 2019-10-10 MED FILL — BIDIL 20-37.5 MG TABS: 20-37.5 | 90 days supply | Qty: 180 | Fill #6

## 2019-10-10 MED FILL — FUROSEMIDE 40 MG TAB: 40 | 90 days supply | Qty: 90 | Fill #6

## 2019-10-10 MED FILL — FOLIC ACID 1 MG TABS: 1 | 30 days supply | Qty: 30 | Fill #1

## 2019-10-10 MED FILL — ATORVASTATIN CALCIUM 40 MG: 40 | 90 days supply | Qty: 90 | Fill #6

## 2019-10-10 MED FILL — SPIRONOLACTONE 25 MG TABLET: 25 | 90 days supply | Qty: 45 | Fill #6

## 2019-10-20 MED FILL — FOLIC ACID 1 MG TABS: 1 | 30 days supply | Qty: 30 | Fill #1

## 2019-10-20 MED FILL — FUROSEMIDE 40 MG TAB: 40 | 30 days supply | Qty: 30 | Fill #6

## 2019-10-20 MED FILL — SPIRONOLACTONE 25 MG TABLET: 25 | 30 days supply | Qty: 15 | Fill #6

## 2019-10-20 MED FILL — ATORVASTATIN CALCIUM 40 MG: 40 | 30 days supply | Qty: 30 | Fill #6

## 2019-10-20 MED FILL — BIDIL 20-37.5 MG TABS: 20-37.5 | 30 days supply | Qty: 60 | Fill #6

## 2020-02-01 ENCOUNTER — Other Ambulatory Visit: Payer: Self-pay | Admitting: Cardiology

## 2020-02-01 MED ORDER — CARVEDILOL 6.25 MG PO TABS: 6.2500 mg | ORAL_TABLET | Freq: Two times a day (BID) | ORAL | 0 refills | Status: DC

## 2020-02-01 MED ORDER — ATORVASTATIN CALCIUM 40 MG PO TABS: 40.0000 mg | ORAL_TABLET | Freq: Every day | ORAL | 0 refills | Status: DC

## 2020-02-01 MED ORDER — THIAMINE HCL 100 MG PO TABS: 100.0000 mg | ORAL_TABLET | Freq: Every day | ORAL | 0 refills | Status: DC

## 2020-02-01 MED ORDER — FUROSEMIDE 40 MG PO TABS: 40.0000 mg | ORAL_TABLET | Freq: Every day | ORAL | 0 refills | Status: DC

## 2020-02-01 MED ORDER — ISOSORB DINITRATE-HYDRALAZINE 20-37.5 MG PO TABS: 1.0000 | ORAL_TABLET | Freq: Two times a day (BID) | ORAL | 0 refills | Status: DC

## 2020-02-01 MED ORDER — SPIRONOLACTONE 25 MG PO TABS: 12.5000 mg | ORAL_TABLET | Freq: Every day | ORAL | 0 refills | Status: DC

## 2020-02-01 NOTE — Telephone Encounter (Signed)
*  STAT* If patient is at the pharmacy, call can be transferred to refill team.   1. Which medications need to be refilled? (please list name of each medication and dose if known)  atorvastatin (LIPITOR) 40 MG tablet carvedilol (COREG) 6.25 MG tablet furosemide (LASIX) 40 MG tablet isosorbide-hydrALAZINE (BIDIL) 20-37.5 MG tablet spironolactone (ALDACTONE) 25 MG tablet thiamine 100 MG tablet   2. Which pharmacy/location (including street and city if local pharmacy) is medication to be sent to?  [facility] - [place], [place] - [address]  3. Do they need a 30 day or 90 day supply? 30 with refills   Pt does not have an appt with Dr. Antoine Poche until 02/15/20

## 2020-02-14 DIAGNOSIS — F1010 Alcohol abuse, uncomplicated: Secondary | ICD-10-CM

## 2020-02-14 DIAGNOSIS — Z7189 Other specified counseling: Secondary | ICD-10-CM | POA: Insufficient documentation

## 2020-02-14 HISTORY — DX: Other specified counseling: Z71.89

## 2020-02-14 HISTORY — DX: Alcohol abuse, uncomplicated: F10.10

## 2020-02-14 NOTE — Progress Notes (Deleted)
Cardiology Office Note   Date:  02/14/2020   ID:  Daryl [name], DOB 04-29-1961, MRN [id number]  PCP:  Patient, No Pcp Per  Cardiologist:   Daryl Breeding, MD   No chief complaint on file.     History of Present Illness: Daryl [name] is a 59 y.o. male who presents for  follow up post sepsis and acute hypoxic respiratory failure in the setting of rhinovirus infection, superimposed on bacterial infection.   This was in Jan.  EF was 20%.  He had coronary calcification on CT.  He underwent a RHC/LHC revealed non-obstructive CAD with no more than 20% stenosis in any of his coronary arteries. He had moderate to severe MR. There was evidence of coronary spasm relieved with NTG. 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 He was placed on Bidil and lasix. No ACE or ARB due to CKD.  He returns for follow up.   Since I last saw him he has done well.  ***   ***  He denies any new cardiovascular symptoms.  He does not have any shortness of breath, PND or orthopnea.  It sounds like he was having trouble getting his medications but he is on them currently.  He has a limited understanding of his disease process but he did remember that his ejection fraction was 20%.  He is having some atypical right upper chest discomfort occasionally but he thinks it is because of a salad that he ate.  He does not have any weight gain or swelling.  He is not having palpitations, presyncope or syncope.  He said no PND or orthopnea.   Past Medical History:  Diagnosis Date  . Asthma     Past Surgical History:  Procedure Laterality Date  . IR THORACENTESIS ASP PLEURAL SPACE W/IMG GUIDE  09/13/2018  . RIGHT/LEFT HEART CATH AND CORONARY ANGIOGRAPHY N/A 09/15/2018   Procedure: RIGHT/LEFT HEART CATH AND CORONARY ANGIOGRAPHY;  Surgeon: Daryl Man, MD;  Location: [facility] CV LAB;  Service: Cardiovascular;  Laterality: N/A;     Current Outpatient Medications  Medication Sig Dispense Refill    . aspirin 81 MG chewable tablet Chew 1 tablet (81 mg total) by mouth daily. 30 tablet 0  . atorvastatin (LIPITOR) 40 MG tablet Take 1 tablet (40 mg total) by mouth daily at 6 PM. 30 tablet 0  . carvedilol (COREG) 6.25 MG tablet Take 1 tablet (6.25 mg total) by mouth 2 (two) times daily. 60 tablet 0  . folic acid (FOLVITE) 1 MG tablet TAKE 1 TABLET (1 MG TOTAL) BY MOUTH DAILY. 30 tablet 0  . furosemide (LASIX) 40 MG tablet Take 1 tablet (40 mg total) by mouth daily. 30 tablet 0  . isosorbide-hydrALAZINE (BIDIL) 20-37.5 MG tablet Take 1 tablet by mouth 2 (two) times daily. 60 tablet 0  . spironolactone (ALDACTONE) 25 MG tablet Take 0.5 tablets (12.5 mg total) by mouth daily. 15 tablet 0  . thiamine 100 MG tablet Take 1 tablet (100 mg total) by mouth daily. 30 tablet 0   No current facility-administered medications for this visit.    Allergies:   Penicillins    ROS:  Please see the history of present illness.   Otherwise, review of systems are positive for ***.   All other systems are reviewed and negative.    PHYSICAL EXAM: VS:  There were no vitals taken for this visit. , BMI There is no height or weight on file to  calculate BMI. GENERAL:  Well appearing NECK:  No jugular venous distention, waveform within normal limits, carotid upstroke brisk and symmetric, no bruits, no thyromegaly LUNGS:  Clear to auscultation bilaterally CHEST:  Unremarkable HEART:  PMI not displaced or sustained,S1 and S2 within normal limits, no S3, no S4, no clicks, no rubs, *** murmurs ABD:  Flat, positive bowel sounds normal in frequency in pitch, no bruits, no rebound, no guarding, no midline pulsatile mass, no hepatomegaly, no splenomegaly EXT:  2 plus pulses throughout, no edema, no cyanosis no clubbing     ***GENERAL:  Well appearing HEENT:  Pupils equal round and reactive, fundi not visualized, oral mucosa unremarkable NECK:  No jugular venous distention, waveform within normal limits, carotid upstroke  brisk and symmetric, no bruits, no thyromegaly LYMPHATICS:  No cervical, inguinal adenopathy LUNGS:  Clear to auscultation bilaterally BACK:  No CVA tenderness CHEST:  Unremarkable HEART:  PMI not displaced or sustained,S1 and S2 within normal limits, no S3, no S4, no clicks, no rubs, no murmurs ABD:  Flat, positive bowel sounds normal in frequency in pitch, no bruits, no rebound, no guarding, no midline pulsatile mass, no hepatomegaly, no splenomegaly EXT:  2 plus pulses throughout, no edema, no cyanosis no clubbing SKIN:  No rashes no nodules NEURO:  Cranial nerves II through XII grossly intact, motor grossly intact throughout PSYCH:  Cognitively intact, oriented to person place and time    EKG:  EKG *** ordered today. ***   Recent Labs: No results found for requested labs within last 8760 hours.    Lipid Panel    Component Value Date/Time   CHOL 127 09/15/2018 0441   TRIG 124 09/15/2018 0441   HDL 33 (L) 09/15/2018 0441   CHOLHDL 3.8 09/15/2018 0441   VLDL 25 09/15/2018 0441   LDLCALC 69 09/15/2018 0441      Wt Readings from Last 3 Encounters:  11/02/18 170 lb (77.1 kg)  09/28/18 166 lb 12.8 oz (75.7 kg)  09/16/18 171 lb 1.2 oz (77.6 kg)      Other studies Reviewed: Additional studies/ records that were reviewed today include:   ***. Review of the above records demonstrates:  ***   ASSESSMENT AND PLAN:   NICM: EF of 20%.  ***   I again went over the plan of care but I think he has limited understanding.  I am going to titrate his medications by increasing his carvedilol to 6.25 mg twice daily.  He needs to bring his medications so that we can review them with him.  We talked about salt restriction.  At this point we need to continue med titration prior to considering further imaging.  I do think he has class I symptoms.  Hypertension:  ***   This is being managed in the context of treating his CHF  CKD:    CKD IIIa.   ***   Creat as above. No change in  therapy.  Once we have titrated his beta-blocker we might consider trying to challenge him with an ACE inhibitor or ARB/ARNI.  However, I would also like to understand his compliance and understanding prior to initiating these medications.  He would need to have close follow-up of his creatinine.  ETOH Abuse:   ***  He says he drinks rarely.  He understands the potential interaction of cardiomyopathy and alcohol.  Covid Education:  ***   Current medicines are reviewed at length with the patient today.  The patient does not have concerns regarding medicines.  The following changes have been made:  ***  Labs/ tests ordered today include:  *** No orders of the defined types were placed in this encounter.    Disposition:   FU with ***     Signed, Rollene Rotunda, MD  02/14/2020 9:00 PM    [facility]

## 2020-02-15 ENCOUNTER — Ambulatory Visit: Payer: Medicaid Other | Admitting: Cardiology

## 2020-02-16 MED FILL — ATORVASTATIN CALCIUM 40 MG: 40 | 30 days supply | Qty: 30 | Fill #0

## 2020-02-16 MED FILL — BIDIL 20-37.5 MG TABS: 20-37.5 | 30 days supply | Qty: 60 | Fill #0

## 2020-02-16 MED FILL — CARVEDILOL 6.25 MG TABLET: 6.25 | 30 days supply | Qty: 60 | Fill #0

## 2020-02-16 MED FILL — SPIRONOLACTONE 25 MG TABLET: 25 | 30 days supply | Qty: 15 | Fill #0

## 2020-02-16 MED FILL — FUROSEMIDE 40 MG TAB: 40 | 30 days supply | Qty: 30 | Fill #0

## 2020-02-19 DIAGNOSIS — N1831 Chronic kidney disease, stage 3a: Secondary | ICD-10-CM | POA: Insufficient documentation

## 2020-02-19 HISTORY — DX: Chronic kidney disease, stage 3a: N18.31

## 2020-02-19 NOTE — Progress Notes (Signed)
Cardiology Office Note   Date:  02/20/2020   ID:  Daryl [name], DOB 04-19-1961, MRN [id number]  PCP:  Patient, No Pcp Per  Cardiologist:   Rollene Rotunda, MD   No chief complaint on file.     History of Present Illness: Daryl [name] is a 59 y.o. male who presents for  follow up post sepsis and acute hypoxic respiratory failure in the setting of rhinovirus infection, superimposed on bacterial infection.   This was in Jan 2020.  EF was 20%.  He had coronary calcification on CT.  He underwent a RHC/LHC revealed non-obstructive CAD with no more than 20% stenosis in any of his coronary arteries. He had moderate to severe MR. There was evidence of coronary spasm relieved with NTG. 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 He was placed on Bidil and lasix. No ACE or ARB due to CKD.  He returns for follow up.    Since I last saw him he has missed 2 follow-up appointments and only shows up now that he is out of his medicines for the last month.  He did not seem to be aware of his previous diagnosis and we reviewed this today.  He has vague symptoms.  He feels sometimes been gargling sound when he lies down.  However, he is not describing classic PND or orthopnea.  He is not having any dyspnea with exertion although it does not sound like he does an awful lot.  He might occasionally lifts things and is not describing significant shortness of breath with this.  He is not having any chest pressure, neck or arm discomfort.  He has had no edema.    Past Medical History:  Diagnosis Date  . Asthma   . Dilated cardiomyopathy [facility])     Past Surgical History:  Procedure Laterality Date  . IR THORACENTESIS ASP PLEURAL SPACE W/IMG GUIDE  09/13/2018  . RIGHT/LEFT HEART CATH AND CORONARY ANGIOGRAPHY N/A 09/15/2018   Procedure: RIGHT/LEFT HEART CATH AND CORONARY ANGIOGRAPHY;  Surgeon: Marykay Lex, MD;  Location: [facility] INVASIVE CV LAB;  Service: Cardiovascular;  Laterality: N/A;      Current Outpatient Medications  Medication Sig Dispense Refill  . aspirin 81 MG chewable tablet Chew 1 tablet (81 mg total) by mouth daily. 30 tablet 0  . atorvastatin (LIPITOR) 40 MG tablet Take 1 tablet (40 mg total) by mouth daily at 6 PM. 90 tablet 3  . carvedilol (COREG) 6.25 MG tablet Take 1 tablet (6.25 mg total) by mouth 2 (two) times daily. 180 tablet 3  . folic acid (FOLVITE) 1 MG tablet TAKE 1 TABLET (1 MG TOTAL) BY MOUTH DAILY. 30 tablet 0  . isosorbide-hydrALAZINE (BIDIL) 20-37.5 MG tablet Take 1 tablet by mouth 2 (two) times daily. 180 tablet 3  . spironolactone (ALDACTONE) 25 MG tablet Take 0.5 tablets (12.5 mg total) by mouth daily. 45 tablet 3  . thiamine 100 MG tablet Take 1 tablet (100 mg total) by mouth daily. 30 tablet 0   No current facility-administered medications for this visit.    Allergies:   Penicillins    ROS:  Please see the history of present illness.   Otherwise, review of systems are positive for none.   All other systems are reviewed and negative.    PHYSICAL EXAM: VS:  BP 125/70   Pulse 81   Temp (!) 96.8 F (36 C)   Ht 5\' 9"  (1.753 m)   Wt 180  lb 1.3 oz (81.7 kg)   SpO2 95%   BMI 26.59 kg/m  , BMI Body mass index is 26.59 kg/m. GENERAL:  Well appearing NECK:  No jugular venous distention, waveform within normal limits, carotid upstroke brisk and symmetric, no bruits, no thyromegaly LUNGS:  Clear to auscultation bilaterally CHEST:  Unremarkable HEART:  PMI not displaced or sustained,S1 and S2 within normal limits, no S3, no S4, no clicks, no rubs, no murmurs ABD:  Flat, positive bowel sounds normal in frequency in pitch, no bruits, no rebound, no guarding, no midline pulsatile mass, no hepatomegaly, no splenomegaly EXT:  2 plus pulses throughout, no edema, no cyanosis no clubbing   EKG:  EKG is ordered today. Sinus rhythm, rate 81, axis within normal limits, intervals within normal limits, premature ventricular contractions in a  bigeminal pattern, T wave changes consistent with early repolarization.   Recent Labs: No results found for requested labs within last 8760 hours.    Lipid Panel    Component Value Date/Time   CHOL 127 09/15/2018 0441   TRIG 124 09/15/2018 0441   HDL 33 (L) 09/15/2018 0441   CHOLHDL 3.8 09/15/2018 0441   VLDL 25 09/15/2018 0441   LDLCALC 69 09/15/2018 0441      Wt Readings from Last 3 Encounters:  02/20/20 180 lb 1.3 oz (81.7 kg)  11/02/18 170 lb (77.1 kg)  09/28/18 166 lb 12.8 oz (75.7 kg)      Other studies Reviewed: Additional studies/ records that were reviewed today include:   None. Review of the above records demonstrates:  NA   ASSESSMENT AND PLAN:   NICM: EF of 20%.   He has been out of his medications.  I am going to renew his medicines although I do not think he needs a Lasix.  I went through the diagnosis with him and showed him exactly the problem.  I Georgina Peer have him come back in 1 month for routine follow-up and blood work can be ordered at that time.  If he is maintaining his medications he should have follow-up echocardiography.  PVCs: He is not really feeling palpitations.  Further work-up will be pending his EF.  Hypertension:    This is being managed in the context of treating his CHF  CKD:    CKD IIIa.   I will check a basic metabolic profile today as well as CBC.  ETOH Abuse:     He says he is only rarely drinking beer.  Covid Education: He has not had his vaccine and we talked about this.  Current medicines are reviewed at length with the patient today.  The patient does not have concerns regarding medicines.  The following changes have been made:  Meds resumed  Labs/ tests ordered today include:    Orders Placed This Encounter  Procedures  . Comprehensive Metabolic Panel (CMET)  . CBC  . EKG 12-Lead     Disposition:   FU with APP in one month.      Signed, Minus Breeding, MD  02/20/2020 12:05 PM    [place]

## 2020-02-20 ENCOUNTER — Encounter: Payer: Self-pay | Admitting: Cardiology

## 2020-02-20 ENCOUNTER — Ambulatory Visit (INDEPENDENT_AMBULATORY_CARE_PROVIDER_SITE_OTHER): Payer: Medicaid Other | Admitting: Cardiology

## 2020-02-20 ENCOUNTER — Other Ambulatory Visit: Payer: Self-pay

## 2020-02-20 VITALS — BP 125/70 | HR 81 | Temp 96.8°F | Ht 69.0 in | Wt 180.1 lb

## 2020-02-20 DIAGNOSIS — I10 Essential (primary) hypertension: Secondary | ICD-10-CM | POA: Diagnosis not present

## 2020-02-20 DIAGNOSIS — Z7189 Other specified counseling: Secondary | ICD-10-CM | POA: Diagnosis not present

## 2020-02-20 DIAGNOSIS — I428 Other cardiomyopathies: Secondary | ICD-10-CM | POA: Diagnosis not present

## 2020-02-20 DIAGNOSIS — N1831 Chronic kidney disease, stage 3a: Secondary | ICD-10-CM

## 2020-02-20 MED ORDER — CARVEDILOL 6.25 MG PO TABS: 6.2500 mg | ORAL_TABLET | Freq: Two times a day (BID) | ORAL | 3 refills | Status: DC

## 2020-02-20 MED ORDER — ISOSORB DINITRATE-HYDRALAZINE 20-37.5 MG PO TABS: 1.0000 | ORAL_TABLET | Freq: Two times a day (BID) | ORAL | 3 refills | Status: DC

## 2020-02-20 MED ORDER — SPIRONOLACTONE 25 MG PO TABS: 12.5000 mg | ORAL_TABLET | Freq: Every day | ORAL | 3 refills | Status: DC

## 2020-02-20 MED ORDER — ATORVASTATIN CALCIUM 40 MG PO TABS: 40.0000 mg | ORAL_TABLET | Freq: Every day | ORAL | 3 refills | Status: DC

## 2020-02-20 NOTE — Patient Instructions (Signed)
Medication Instructions:  Refill sent to the pharmacy electronically.  *If you need a refill on your cardiac medications before your next appointment, please call your pharmacy*   Lab Work: Your physician recommends that you HAVE LAB WORK TODAY If you have labs (blood work) drawn today and your tests are completely normal, you will receive your results only by:  MyChart Message (if you have MyChart) OR  A paper copy in the mail If you have any lab test that is abnormal or we need to change your treatment, we will call you to review the results.   Follow-Up: At [facility], you and your health needs are our priority.  As part of our continuing mission to provide you with exceptional heart care, we have created designated Provider Care Teams.  These Care Teams include your primary Cardiologist (physician) and Advanced Practice Providers (APPs -  Physician Assistants and Nurse Practitioners) who all work together to provide you with the care you need, when you need it.  We recommend signing up for the patient portal called "MyChart".  Sign up information is provided on this After Visit Summary.  MyChart is used to connect with patients for Virtual Visits (Telemedicine).  Patients are able to view lab/test results, encounter notes, upcoming appointments, etc.  Non-urgent messages can be sent to your provider as well.   To learn more about what you can do with MyChart, go to [email or web address].    Your next appointment:    Your physician recommends that you schedule a follow-up appointment in: 1 MONTH WITH APP

## 2020-02-21 LAB — COMPREHENSIVE METABOLIC PANEL
ALT: 21 IU/L (ref 0–44)
AST: 21 IU/L (ref 0–40)
Albumin/Globulin Ratio: 1.6 (ref 1.2–2.2)
Albumin: 4.6 g/dL (ref 3.8–4.9)
Alkaline Phosphatase: 102 IU/L (ref 48–121)
BUN/Creatinine Ratio: 11 (ref 9–20)
BUN: 15 mg/dL (ref 6–24)
Bilirubin Total: 0.7 mg/dL (ref 0.0–1.2)
CO2: 23 mmol/L (ref 20–29)
Calcium: 9.3 mg/dL (ref 8.7–10.2)
Chloride: 102 mmol/L (ref 96–106)
Creatinine, Ser: 1.35 mg/dL — ABNORMAL HIGH (ref 0.76–1.27)
GFR calc Af Amer: 66 mL/min/{1.73_m2} (ref >59)
GFR calc non Af Amer: 57 mL/min/{1.73_m2} — ABNORMAL LOW (ref >59)
Globulin, Total: 2.9 g/dL (ref 1.5–4.5)
Glucose: 103 mg/dL — ABNORMAL HIGH (ref 65–99)
Potassium: 4.5 mmol/L (ref 3.5–5.2)
Sodium: 141 mmol/L (ref 134–144)
Total Protein: 7.5 g/dL (ref 6.0–8.5)

## 2020-02-21 LAB — CBC
Hematocrit: 45.9 % (ref 37.5–51.0)
Hemoglobin: 14.6 g/dL (ref 13.0–17.7)
MCH: 28.3 pg (ref 26.6–33.0)
MCHC: 31.8 g/dL (ref 31.5–35.7)
MCV: 89 fL (ref 79–97)
Platelets: 333 10*3/uL (ref 150–450)
RBC: 5.15 x10E6/uL (ref 4.14–5.80)
RDW: 12.2 % (ref 11.6–15.4)
WBC: 9.9 10*3/uL (ref 3.4–10.8)

## 2020-02-22 ENCOUNTER — Encounter: Payer: Self-pay | Admitting: *Deleted

## 2020-03-01 MED FILL — FUROSEMIDE 40 MG TAB: 40 | 30 days supply | Qty: 30 | Fill #0

## 2020-03-01 MED FILL — ATORVASTATIN CALCIUM 40 MG: 40 | 30 days supply | Qty: 30 | Fill #0

## 2020-03-01 MED FILL — SPIRONOLACTONE 25 MG TABLET: 25 | 30 days supply | Qty: 15 | Fill #0

## 2020-03-01 MED FILL — CARVEDILOL 6.25 MG TABLET: 6.25 | 30 days supply | Qty: 60 | Fill #0

## 2020-03-01 MED FILL — BIDIL 20-37.5 MG TABS: 20-37.5 | 30 days supply | Qty: 60 | Fill #0

## 2020-03-22 ENCOUNTER — Ambulatory Visit: Payer: Medicaid Other | Admitting: Cardiology

## 2020-08-07 NOTE — Progress Notes (Signed)
Cardiology Office Note   Date:  08/08/2020   ID:  TYLEEK [name], DOB June 01, 1961, MRN [id number]  PCP:  Patient, No Pcp Per  Cardiologist:   Rollene Rotunda, MD   Chief Complaint  Patient presents with  . Cardiomyopathy      History of Present Illness: Daryl [name] is a 59 y.o. male who presents for  follow up post sepsis and acute hypoxic respiratory failure in the setting of rhinovirus infection, superimposed on bacterial infection.   This was in Jan 2020.  EF was 20%.  He had coronary calcification on CT.  He underwent a RHC/LHC revealed non-obstructive CAD with no more than 20% stenosis in any of his coronary arteries. He had moderate to severe MR. There was evidence of coronary spasm relieved with NTG. 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 He was placed on Bidil and lasix. No ACE or ARB due to CKD.    He has been a no show for several appts. today he came late to his appointment.  He is not having any new symptoms other than some mucus that he thinks he has had a cold.  He is not describing PND or orthopnea.  He is having occasional thick mucus that he coughs up.  He has not been having any fevers or chills.  He unfortunately is not taking any of the medications.  Is not had any weight gain or swelling.  He has had no new palpitations, presyncope or syncope.  Has had 1 beer today and says he is not drinking every day.   Past Medical History:  Diagnosis Date  . Acute combined systolic and diastolic heart failure (HCC)   . Acute respiratory failure (HCC) 09/11/2018  . AKI (acute kidney injury) (HCC) 09/11/2018  . Asthma   . 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 Dilated cardiomyopathy (HCC)   . Educated about COVID-19 virus infection 02/14/2020  . Essential hypertension 11/02/2018  . ETOH abuse 02/14/2020  . NSVT (nonsustained ventricular tachycardia) (HCC)   . PNA (pneumonia) 09/11/2018  . Sepsis (HCC) 09/11/2018  . Stage 3a chronic kidney  disease (HCC) 02/19/2020  . Tobacco abuse 09/11/2018    Past Surgical History:  Procedure Laterality Date  . IR THORACENTESIS ASP PLEURAL SPACE W/IMG GUIDE  09/13/2018  . RIGHT/LEFT HEART CATH AND CORONARY ANGIOGRAPHY N/A 09/15/2018   Procedure: RIGHT/LEFT HEART CATH AND CORONARY ANGIOGRAPHY;  Surgeon: Marykay Lex, MD;  Location: [facility] INVASIVE CV LAB;  Service: Cardiovascular;  Laterality: N/A;     Current Outpatient Medications  Medication Sig Dispense Refill  . aspirin 81 MG chewable tablet Chew 1 tablet (81 mg total) by mouth daily. 30 tablet 0  . folic acid (FOLVITE) 1 MG tablet TAKE 1 TABLET (1 MG TOTAL) BY MOUTH DAILY. 30 tablet 0  . thiamine 100 MG tablet Take 1 tablet (100 mg total) by mouth daily. 30 tablet 0  . atorvastatin (LIPITOR) 40 MG tablet Take 1 tablet (40 mg total) by mouth daily at 6 PM. 90 tablet 3  . carvedilol (COREG) 6.25 MG tablet Take 1 tablet (6.25 mg total) by mouth 2 (two) times daily. 180 tablet 3  . isosorbide-hydrALAZINE (BIDIL) 20-37.5 MG tablet Take 1 tablet by mouth 2 (two) times daily. 180 tablet 3  . spironolactone (ALDACTONE) 25 MG tablet Take 0.5 tablets (12.5 mg total) by mouth daily. 45 tablet 3   No current facility-administered medications for this visit.  Allergies:   Penicillins    ROS:  Please see the history of present illness.   Otherwise, review of systems are positive for none.   All other systems are reviewed and negative.    PHYSICAL EXAM: VS:  BP 130/82   Pulse (!) 108   Temp (!) 97.5 F (36.4 C)   Ht 5\' 9"  (1.753 m)   Wt 183 lb 12.8 oz (83.4 kg)   SpO2 98%   BMI 27.14 kg/m  , BMI Body mass index is 27.14 kg/m. GENERAL:  Well appearing NECK:  No jugular venous distention, waveform within normal limits, carotid upstroke brisk and symmetric, no bruits, no thyromegaly LUNGS:  Clear to auscultation bilaterally CHEST:  Unremarkable HEART:  PMI not displaced or sustained,S1 and S2 within normal limits, no S3, no S4, no  clicks, no rubs, 3 out of 6 holosystolic murmur at the apex, no diastolic murmurs ABD:  Flat, positive bowel sounds normal in frequency in pitch, no bruits, no rebound, no guarding, no midline pulsatile mass, no hepatomegaly, no splenomegaly EXT:  2 plus pulses throughout, no edema, no cyanosis no clubbing   EKG:  EKG is  ordered today. Sinus rhythm, rate 103, axis within normal limits, intervals within normal limits, left atrial enlargement, nonspecific T wave inversions no change from previous   Recent Labs: 02/20/2020: ALT 21; BUN 15; Creatinine, Ser 1.35; Hemoglobin 14.6; Platelets 333; Potassium 4.5; Sodium 141    Lipid Panel    Component Value Date/Time   CHOL 127 09/15/2018 0441   TRIG 124 09/15/2018 0441   HDL 33 (L) 09/15/2018 0441   CHOLHDL 3.8 09/15/2018 0441   VLDL 25 09/15/2018 0441   LDLCALC 69 09/15/2018 0441      Wt Readings from Last 3 Encounters:  08/08/20 183 lb 12.8 oz (83.4 kg)  02/20/20 180 lb 1.3 oz (81.7 kg)  11/02/18 170 lb (77.1 kg)      Other studies Reviewed: Additional studies/ records that were reviewed today include:   None. Review of the above records demonstrates:  NA   ASSESSMENT AND PLAN:   NICM:   EF of 20%.   He has been noncompliant with appointments and medications.   I reaffirmed with him that he has to restart his medications as listed above.  I reviewed with him the number of appointments he is missed.  We are giving him a 1 month appointment at which point if he is taking his medications I would reassess blood work and plan for follow-up echo.  He understands importance of avoiding alcohol altogether.  We had a long discussion about the fact that he will not do well without medication and visit compliance.  At this point he actually seems to be relatively euvolemic.  I do not think the cough indicates hypervolemia.   PVCs: He is not feeling any PVCs.  No change in therapy.   Hypertension:    This is being managed in the context of  treating his CHF   CKD:    CKD IIIa.   This can be checked in 1 month as above.  ETOH Abuse:      He says he is only rarely drinking beer.  He understands the need for complete abstinence.  Covid Education: I explained to him the importance of the Covid vaccine and the fact that he is free.  We discussed where he could get it.  Current medicines are reviewed at length with the patient today.  The patient does not have  concerns regarding medicines.  The following changes have been made: I reinitiated his medications  Labs/ tests ordered today include:  None  Orders Placed This Encounter  Procedures  . EKG 12-Lead     Disposition:   FU with Corine Shelter PAC in one month   Signed, Rollene Rotunda, MD  08/08/2020 9:18 PM    [facility]

## 2020-08-08 ENCOUNTER — Other Ambulatory Visit: Payer: Self-pay | Admitting: Cardiology

## 2020-08-08 ENCOUNTER — Other Ambulatory Visit: Payer: Self-pay

## 2020-08-08 ENCOUNTER — Ambulatory Visit (INDEPENDENT_AMBULATORY_CARE_PROVIDER_SITE_OTHER): Payer: Medicaid Other | Admitting: Cardiology

## 2020-08-08 ENCOUNTER — Encounter: Payer: Self-pay | Admitting: Cardiology

## 2020-08-08 VITALS — BP 130/82 | HR 108 | Temp 97.5°F | Ht 69.0 in | Wt 183.8 lb

## 2020-08-08 DIAGNOSIS — I5041 Acute combined systolic (congestive) and diastolic (congestive) heart failure: Secondary | ICD-10-CM | POA: Diagnosis not present

## 2020-08-08 DIAGNOSIS — I420 Dilated cardiomyopathy: Secondary | ICD-10-CM

## 2020-08-08 MED ORDER — CARVEDILOL 6.25 MG PO TABS: 6.2500 mg | ORAL_TABLET | Freq: Two times a day (BID) | ORAL | 3 refills | Status: DC

## 2020-08-08 MED ORDER — ISOSORB DINITRATE-HYDRALAZINE 20-37.5 MG PO TABS: 1.0000 | ORAL_TABLET | Freq: Two times a day (BID) | ORAL | 3 refills | Status: DC

## 2020-08-08 MED ORDER — ATORVASTATIN CALCIUM 40 MG PO TABS: 40.0000 mg | ORAL_TABLET | Freq: Every day | ORAL | 3 refills | Status: DC

## 2020-08-08 MED ORDER — DILTIAZEM HCL ER COATED BEADS 120 MG PO CP24: 120.0000 mg | ORAL_CAPSULE | Freq: Every day | ORAL | 3 refills | Status: DC

## 2020-08-08 MED ORDER — SPIRONOLACTONE 25 MG PO TABS: 12.5000 mg | ORAL_TABLET | Freq: Every day | ORAL | 3 refills | Status: DC

## 2020-08-08 NOTE — Patient Instructions (Addendum)
Medication Instructions:  Medications refilled - sent to [facility] [phone]) *If you need a refill on your cardiac medications before your next appointment, please call your pharmacy*  Lab Work: None ordered this visit.  Testing/Procedures: None ordered this visit  Follow-Up: At [facility], you and your health needs are our priority.  As part of our continuing mission to provide you with exceptional heart care, we have created designated Provider Care Teams.  These Care Teams include your primary Cardiologist (physician) and Advanced Practice Providers (APPs -  Physician Assistants and Nurse Practitioners) who all work together to provide you with the care you need, when you need it.  Your next appointment:   Tuesday 09/11/2020 At 3:45pm  The format for your next appointment:   In Person  Provider:   Corine Shelter

## 2020-08-09 MED FILL — SPIRONOLACTONE 25 MG TABLET: 25 | 90 days supply | Qty: 45 | Fill #0

## 2020-08-09 MED FILL — BIDIL 20-37.5 MG TABS: 20-37.5 | 90 days supply | Qty: 180 | Fill #0

## 2020-08-09 MED FILL — ATORVASTATIN CALCIUM 40 MG: 40 | 90 days supply | Qty: 90 | Fill #0

## 2020-08-09 MED FILL — CARVEDILOL 6.25 MG TABLET: 6.25 | 90 days supply | Qty: 180 | Fill #0

## 2020-08-21 ENCOUNTER — Other Ambulatory Visit: Payer: Self-pay | Admitting: Cardiology

## 2020-08-21 MED FILL — CARVEDILOL 6.25 MG TABLET: 6.25 | 90 days supply | Qty: 180 | Fill #0

## 2020-08-21 MED FILL — ATORVASTATIN CALCIUM 40 MG: 40 | 90 days supply | Qty: 90 | Fill #0

## 2020-08-21 MED FILL — SPIRONOLACTONE 25 MG TABLET: 25 | 90 days supply | Qty: 45 | Fill #0

## 2020-08-21 MED FILL — BIDIL 20-37.5 MG TABS: 20-37.5 | 90 days supply | Qty: 180 | Fill #0

## 2020-09-11 ENCOUNTER — Ambulatory Visit: Payer: Medicaid Other | Admitting: Cardiology

## 2020-09-19 ENCOUNTER — Ambulatory Visit: Payer: Medicaid Other | Admitting: Cardiology

## 2020-11-01 NOTE — Progress Notes (Signed)
Cardiology Office Note   Date:  11/02/2020   ID:  [name], DOB May 23, 1961, MRN [id number]  PCP:  Patient, No Pcp Per  Cardiologist:   Minus Breeding, MD   Chief Complaint  Patient presents with  . Cardiomyopathy      History of Present Illness: Daryl [name] is a 60 y.o. male who presents for  follow up post sepsis and acute hypoxic respiratory failure in the setting of rhinovirus infection, superimposed on bacterial infection.   This was in Jan 2020.  EF was 20%.  He had coronary calcification on CT.  He underwent a RHC/LHC revealed non-obstructive CAD with no more than 20% stenosis in any of his coronary arteries. He had moderate to severe MR. There was evidence of coronary spasm relieved with NTG. 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 He was placed on Bidil and lasix. No ACE or ARB due to CKD.    He has been a no show for several appts. It turns out he had been in jail recently and at best many of his medications for about 3 weeks.  He is back on these.  He actually denies any new shortness of breath he had no new palpitations, presyncope or syncope.  He denies PND or orthopnea.  He has no palpitations, presyncope or syncope.  He has had no weight gain or edema.   Past Medical History:  Diagnosis Date  . Acute combined systolic and diastolic heart failure ([place])   . Acute respiratory failure ([place]) 09/11/2018  . AKI (acute kidney injury) ([place]) 09/11/2018  . Asthma   . 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 Dilated cardiomyopathy ([place])   . Educated about COVID-19 virus infection 02/14/2020  . Essential hypertension 11/02/2018  . ETOH abuse 02/14/2020  . NSVT (nonsustained ventricular tachycardia) ([place])   . PNA (pneumonia) 09/11/2018  . Sepsis ([place]) 09/11/2018  . Stage 3a chronic kidney disease ([place]) 02/19/2020  . Tobacco abuse 09/11/2018    Past Surgical History:  Procedure Laterality Date  . IR THORACENTESIS ASP PLEURAL SPACE W/IMG  GUIDE  09/13/2018  . RIGHT/LEFT HEART CATH AND CORONARY ANGIOGRAPHY N/A 09/15/2018   Procedure: RIGHT/LEFT HEART CATH AND CORONARY ANGIOGRAPHY;  Surgeon: Leonie Man, MD;  Location: [facility] CV LAB;  Service: Cardiovascular;  Laterality: N/A;     Current Outpatient Medications  Medication Sig Dispense Refill  . atorvastatin (LIPITOR) 40 MG tablet Take 1 tablet (40 mg total) by mouth daily at 6 PM. 90 tablet 3  . folic acid (FOLVITE) 1 MG tablet TAKE 1 TABLET (1 MG TOTAL) BY MOUTH DAILY. 30 tablet 0  . isosorbide-hydrALAZINE (BIDIL) 20-37.5 MG tablet Take 1 tablet by mouth 2 (two) times daily. 180 tablet 3  . spironolactone (ALDACTONE) 25 MG tablet Take 0.5 tablets (12.5 mg total) by mouth daily. 45 tablet 3  . thiamine 100 MG tablet Take 1 tablet (100 mg total) by mouth daily. 30 tablet 0  . aspirin 81 MG chewable tablet Chew 1 tablet (81 mg total) by mouth daily. (Patient not taking: Reported on 11/02/2020) 30 tablet 0  . carvedilol (COREG) 12.5 MG tablet Take 1 tablet (12.5 mg total) by mouth in the morning and at bedtime. 30 tablet 6   No current facility-administered medications for this visit.    Allergies:   Penicillins    ROS:  Please see the history of present illness.   Otherwise, review of systems are positive for  mucous in his stool .   All other systems are reviewed and negative.    PHYSICAL has been back on him now just to be met: VS:  BP 110/64   Pulse 78   Ht '5\' 9"'  (1.753 m)   Wt 189 lb (85.7 kg)   SpO2 96%   BMI 27.91 kg/m  , BMI Body mass index is 27.91 kg/m. GENERAL:  Well appearing NECK:  No jugular venous distention, waveform within normal limits, carotid upstroke brisk and symmetric, no bruits, no thyromegaly LUNGS:  Clear to auscultation bilaterally CHEST:  Unremarkable HEART:  PMI not displaced or sustained,S1 and S2 within normal limits, no S3, no S4, no clicks, no rubs, no murmurs ABD:  Flat, positive bowel sounds normal in frequency in pitch, no  bruits, no rebound, no guarding, no midline pulsatile mass, no hepatomegaly, no splenomegaly EXT:  2 plus pulses throughout, no edema, no cyanosis no clubbing  EKG:  EKG is not ordered today.   Recent Labs: 02/20/2020: ALT 21; BUN 15; Creatinine, Ser 1.35; Hemoglobin 14.6; Platelets 333; Potassium 4.5; Sodium 141    Lipid Panel    Component Value Date/Time   CHOL 127 09/15/2018 0441   TRIG 124 09/15/2018 0441   HDL 33 (L) 09/15/2018 0441   CHOLHDL 3.8 09/15/2018 0441   VLDL 25 09/15/2018 0441   LDLCALC 69 09/15/2018 0441      Wt Readings from Last 3 Encounters:  11/02/20 189 lb (85.7 kg)  08/08/20 183 lb 12.8 oz (83.4 kg)  02/20/20 180 lb 1.3 oz (81.7 kg)      Other studies Reviewed: Additional studies/ records that were reviewed today include:   None. Review of the above records demonstrates:  NA    ASSESSMENT AND PLAN:   NICM:   EF of 20%.   I suspect that he is taking his meds.  I cannot be sure but I will assume so.  I will increase the Coreg to 12.5 mg BID.  He can come back in one month for med titration.   PVCs:  He does not feel these .  No change in therapy.    Hypertension:    This is being treated in the context of treating his cardiomyopathy.    CKD:    CKD IIIa.   I will check a BMET today.    ETOH Abuse:      He says that he is only drinking rarely.  No change in therapy.     Current medicines are reviewed at length with the patient today.  The patient does not have concerns regarding medicines.  The following changes have been made:  As above.   Labs/ tests ordered today include:    Orders Placed This Encounter  Procedures  . Basic metabolic panel     Disposition:   FU with me APP in one month.    Signed, Minus Breeding, MD  11/02/2020 1:36 PM    [facility]

## 2020-11-02 ENCOUNTER — Encounter: Payer: Self-pay | Admitting: Cardiology

## 2020-11-02 ENCOUNTER — Other Ambulatory Visit: Payer: Self-pay

## 2020-11-02 ENCOUNTER — Ambulatory Visit (INDEPENDENT_AMBULATORY_CARE_PROVIDER_SITE_OTHER): Payer: Medicaid Other | Admitting: Cardiology

## 2020-11-02 ENCOUNTER — Other Ambulatory Visit: Payer: Self-pay | Admitting: Cardiology

## 2020-11-02 VITALS — BP 110/64 | HR 78 | Ht 69.0 in | Wt 189.0 lb

## 2020-11-02 DIAGNOSIS — I10 Essential (primary) hypertension: Secondary | ICD-10-CM

## 2020-11-02 DIAGNOSIS — I472 Ventricular tachycardia: Secondary | ICD-10-CM | POA: Diagnosis not present

## 2020-11-02 DIAGNOSIS — N1831 Chronic kidney disease, stage 3a: Secondary | ICD-10-CM | POA: Diagnosis not present

## 2020-11-02 DIAGNOSIS — I428 Other cardiomyopathies: Secondary | ICD-10-CM

## 2020-11-02 DIAGNOSIS — I4729 Other ventricular tachycardia: Secondary | ICD-10-CM

## 2020-11-02 DIAGNOSIS — Z79899 Other long term (current) drug therapy: Secondary | ICD-10-CM

## 2020-11-02 MED ORDER — CARVEDILOL 12.5 MG PO TABS: 12.5000 mg | ORAL_TABLET | Freq: Two times a day (BID) | ORAL | 6 refills | Status: DC

## 2020-11-02 MED FILL — CARVEDILOL 12.5 MG TABLET: 12.5 | 30 days supply | Qty: 30 | Fill #0

## 2020-11-02 NOTE — Patient Instructions (Signed)
Medication Instructions:  INCREASE CARVEDILOL 12.5MG  TWICE DAILY *If you need a refill on your cardiac medications before your next appointment, please call your pharmacy*  Lab Work: BMET TODAY If you have labs (blood work) drawn today and your tests are completely normal, you will receive your results only by:  MyChart Message (if you have MyChart) OR A paper copy in the mail.  If you have any lab test that is abnormal or we need to change your treatment, we will call you to review the results. You may go to any [facility] that is convenient for you however, we do have a lab in our office that is able to assist you. You DO NOT need an appointment for our lab. The lab is open 8:00am and closes at 4:00pm. Lunch 12:45 - 1:45pm.  Follow-Up: Your next appointment:  2 month(s) In Person with You will see one of the following Advanced Practice Providers on your designated Care Team:  Theodore Demark, PA-C -OR- Joni Reining, DNP, ANP  At [facility], you and your health needs are our priority.  As part of our continuing mission to provide you with exceptional heart care, we have created designated Provider Care Teams.  These Care Teams include your primary Cardiologist (physician) and Advanced Practice Providers (APPs -  Physician Assistants and Nurse Practitioners) who all work together to provide you with the care you need, when you need it.  We recommend signing up for the patient portal called "MyChart".  Sign up information is provided on this After Visit Summary.  MyChart is used to connect with patients for Virtual Visits (Telemedicine).  Patients are able to view lab/test results, encounter notes, upcoming appointments, etc.  Non-urgent messages can be sent to your provider as well.   To learn more about what you can do with MyChart, go to [email or web address].

## 2020-11-26 ENCOUNTER — Telehealth: Payer: Self-pay | Admitting: Cardiology

## 2020-11-26 NOTE — Telephone Encounter (Signed)
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 He is hoping to discuss this with Dr. Jenene Slicker nurse. pleae call when able.

## 2020-11-26 NOTE — Telephone Encounter (Signed)
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 Aware will forward to dr hochrein for okay to write letter.

## 2020-11-27 NOTE — Telephone Encounter (Signed)
I would not want to write this until we have had a chance to see and treat more consistently and evaluate his EF going forward.  He has had many missed appts and needs to develop a track record with [place].

## 2020-11-27 NOTE — Telephone Encounter (Signed)
Spoke with pt, aware of dr hochrein's recommendations. ?

## 2021-01-02 NOTE — Progress Notes (Deleted)
Cardiology Office Note   Date:  01/02/2021   ID:  Daryl [name], DOB 02/15/1961, MRN [id number]  PCP:  Patient, No Pcp Per (Inactive)  Cardiologist:  Dr. Antoine Poche  No chief complaint on file.    History of Present Illness: Daryl [name] is a 60 y.o. male who presents for ongoing assessment and management of history of  post sepsis and acute hypoxic respiratory failure in the setting of rhinovirus infection, superimposed on bacterial infection.  This was in Jan 2020.  EF was 20%.  He had coronary calcification on CT.  He underwent a RHC/LHC revealed non-obstructive CAD with no more than 20% stenosis in any of his coronary arteries.He had moderate to severe MR.There was evidence of coronary spasm relieved with NTG. 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 He was placed on Bidil and lasix. No ACE or ARB due to CKD.    On last office visit with Dr. Antoine Poche Coreg was increased to 12.5 mg BID with EF of 20%.  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 Acute combined systolic and diastolic heart failure (HCC)   . Acute respiratory failure (HCC) 09/11/2018  . AKI (acute kidney injury) (HCC) 09/11/2018  . Asthma   . 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 Dilated cardiomyopathy (HCC)   . Educated about COVID-19 virus infection 02/14/2020  . Essential hypertension 11/02/2018  . ETOH abuse 02/14/2020  . NSVT (nonsustained ventricular tachycardia) (HCC)   . PNA (pneumonia) 09/11/2018  . Sepsis (HCC) 09/11/2018  . Stage 3a chronic kidney disease (HCC) 02/19/2020  . Tobacco abuse 09/11/2018    Past Surgical History:  Procedure Laterality Date  . IR THORACENTESIS ASP PLEURAL SPACE W/IMG GUIDE  09/13/2018  . RIGHT/LEFT HEART CATH AND CORONARY ANGIOGRAPHY N/A 09/15/2018   Procedure: RIGHT/LEFT HEART CATH AND CORONARY ANGIOGRAPHY;  Surgeon: Marykay Lex, MD;  Location:  [facility] INVASIVE CV LAB;  Service: Cardiovascular;  Laterality: N/A;     Current Outpatient Medications  Medication Sig Dispense Refill  . aspirin 81 MG chewable tablet Chew 1 tablet (81 mg total) by mouth daily. (Patient not taking: Reported on 11/02/2020) 30 tablet 0  . atorvastatin (LIPITOR) 40 MG tablet TAKE 1 TABLET (40 MG TOTAL) BY MOUTH DAILY AT 6 PM. 90 tablet 3  . carvedilol (COREG) 12.5 MG tablet TAKE 1 TABLET (12.5 MG TOTAL) BY MOUTH IN THE MORNING AND AT BEDTIME. 30 tablet 6  . folic acid (FOLVITE) 1 MG tablet TAKE 1 TABLET (1 MG TOTAL) BY MOUTH DAILY. 30 tablet 0  . isosorbide-hydrALAZINE (BIDIL) 20-37.5 MG tablet TAKE 1 TABLET BY MOUTH 2 (TWO) TIMES DAILY. 180 tablet 3  . spironolactone (ALDACTONE) 25 MG tablet TAKE 0.5 TABLETS (12.5 MG TOTAL) BY MOUTH DAILY. 45 tablet 3  . thiamine 100 MG tablet Take 1 tablet (100 mg total) by mouth daily. 30 tablet 0   No current facility-administered medications for this visit.    Allergies:   Penicillins    Social History:  The patient  reports that he has been smoking. He has been smoking about 0.50 packs per day. He has never used smokeless tobacco. He reports current alcohol use. He reports that he does not use drugs.   Family History:  The patient's family history is not on file.    ROS: All other systems are reviewed and negative. Unless  otherwise mentioned in H&P    PHYSICAL EXAM: VS:  There were no vitals taken for this visit. , BMI There is no height or weight on file to calculate BMI. GEN: Well nourished, well developed, in no acute distress HEENT: normal Neck: no JVD, carotid bruits, or masses Cardiac: ***RRR; no murmurs, rubs, or gallops,no edema  Respiratory:  Clear to auscultation bilaterally, normal work of breathing GI: soft, nontender, nondistended, + BS MS: no deformity or atrophy Skin: warm and dry, no rash Neuro:  Strength and sensation are intact Psych: euthymic mood, full affect   EKG:  EKG {ACTION; IS/IS  POE:[id number]} ordered today. The ekg ordered today demonstrates ***   Recent Labs: 02/20/2020: ALT 21; BUN 15; Creatinine, Ser 1.35; Hemoglobin 14.6; Platelets 333; Potassium 4.5; Sodium 141    Lipid Panel    Component Value Date/Time   [name] 127 09/15/2018 0441   TRIG 124 09/15/2018 0441   HDL 33 (L) 09/15/2018 0441   CHOLHDL 3.8 09/15/2018 0441   VLDL 25 09/15/2018 0441   LDLCALC 69 09/15/2018 0441      Wt Readings from Last 3 Encounters:  11/02/20 189 lb (85.7 kg)  08/08/20 183 lb 12.8 oz (83.4 kg)  02/20/20 180 lb 1.3 oz (81.7 kg)      Other studies Reviewed: Cardiac Cath 09/15/2018   Hemodynamic findings consistent with mild pulmonary hypertension.  LV end diastolic pressure is mildly elevated.  Prox RCA to Mid RCA lesion is 15% stenosed.  Mid RCA lesion is 20% stenosed.  Prox LAD lesion is 20% stenosed.  Dist RCA lesion is 20% stenosed with 30% stenosed side branch in Ost RPDA.   SUMMARY  Angiographic evidence of coronary spasm relieved with nitroglycerin in the mid and distal RCA otherwise minimal disease in the LAD.  NONISCHEMIC CARDIOMYOPATHY  Evidence of elevated right-sided filling pressures with an RAP of 14 mmHg and RVEDP of 17 million mercury consistent with LVEDP of 17-20 mmHg.  RECOMMENDATIONS  Patient will return to nursing unit after TR band removal.  Continue cardiomyopathy treatment regimen.  Appears to be essentially euvolemic by cath.  Defer timing of discharge to primary team and consultative service.   Bryan Lemma, M.D., M.S. Interventional Cardiologist   Echocardiogram 09/13/2018 Study Conclusions   - Left ventricle: The cavity size was mildly dilated. Wall  thickness was normal. The estimated ejection fraction was 20%.  Diffuse hypokinesis. Doppler parameters are consistent with  restrictive physiology, indicative of decreased left ventricular  diastolic compliance and/or increased left atrial pressure.  -  Aortic valve: There was no stenosis.  - Mitral valve: There was moderate to severe central regurgitation,  probably functional.  - Left atrium: The atrium was severely dilated.  - Right ventricle: The cavity size was mildly dilated. Systolic  function was mildly reduced.  - Right atrium: The atrium was moderately dilated.  - Tricuspid valve: There was moderate-severe regurgitation.  Visually looks moderate but there is systolic flow reversal in  the hepatic vein doppler pattern. Peak RV-RA gradient (S): 25 mm  Hg.  - Pulmonary arteries: PA peak pressure: 40 mm Hg (S).  - Systemic veins: IVC measured 2.25 cm with < 50% respirophasic  variation, suggesting RA pressure 15 mmHg.   ASSESSMENT AND PLAN:  1.  ***   Current medicines are reviewed at length with the patient today.  I have spent *** dedicated to the care of this patient on the date of this encounter to include pre-visit review of records, assessment, management and  diagnostic testing,with shared decision making.  Labs/ tests ordered today include: *** Bettey Mare. Liborio Nixon, ANP, AACC   01/02/2021 6:34 PM    [facility] [address] Office [phone] Fax [phone]  Notice: This dictation was prepared with Dragon dictation along with smaller phrase technology. Any transcriptional errors that result from this process are unintentional and may not be corrected upon review.

## 2021-01-04 ENCOUNTER — Ambulatory Visit: Payer: Medicaid Other | Admitting: Adult Health

## 2021-01-14 ENCOUNTER — Telehealth: Payer: Self-pay | Admitting: Cardiology

## 2021-01-14 NOTE — Telephone Encounter (Signed)
Returned call to patient- patient reports right foot swelling with pain in his ankle.   Denies any known injury.   Reports he is unable to walk on this foot due to sharp pain.  States this has happened before and resolves on its own.  Denies any change in color or temperature.   Reports pain to ankle when touching it.   No PCP.   Advised to elevate and ice ankle, if unable to ambulate would recommend UC evaluation.   Patient verbalized understanding.

## 2021-01-14 NOTE — Telephone Encounter (Signed)
Patient states the other day he took off his socks and his right foot has been hurting. He states he is not sure if it is his vein. He states it is at the bottom of his foot at the heel, ball and also some in his knee. He states he has trouble walking on it and when he touches it it feels like it might be swelled a little bit. He states he has no other symptoms.

## 2021-02-14 ENCOUNTER — Ambulatory Visit (INDEPENDENT_AMBULATORY_CARE_PROVIDER_SITE_OTHER): Payer: Medicaid Other | Admitting: Physician Assistant

## 2021-02-14 ENCOUNTER — Other Ambulatory Visit: Payer: Self-pay

## 2021-02-14 ENCOUNTER — Encounter: Payer: Self-pay | Admitting: Physician Assistant

## 2021-02-14 VITALS — BP 134/78 | HR 89 | Ht 69.0 in | Wt 187.0 lb

## 2021-02-14 DIAGNOSIS — I428 Other cardiomyopathies: Secondary | ICD-10-CM | POA: Diagnosis not present

## 2021-02-14 DIAGNOSIS — I2510 Atherosclerotic heart disease of native coronary artery without angina pectoris: Secondary | ICD-10-CM

## 2021-02-14 DIAGNOSIS — N183 Chronic kidney disease, stage 3 unspecified: Secondary | ICD-10-CM | POA: Diagnosis not present

## 2021-02-14 DIAGNOSIS — I10 Essential (primary) hypertension: Secondary | ICD-10-CM | POA: Diagnosis not present

## 2021-02-14 DIAGNOSIS — Z79899 Other long term (current) drug therapy: Secondary | ICD-10-CM

## 2021-02-14 MED ORDER — VALSARTAN 40 MG PO TABS
40.0000 mg | ORAL_TABLET | Freq: Two times a day (BID) | ORAL | 3 refills | Status: DC
Start: 2021-02-14 — End: 2021-07-05
  Filled 2021-02-14: qty 180, 90d supply, fill #0

## 2021-02-14 MED ORDER — CARVEDILOL 3.125 MG PO TABS
3.1250 mg | ORAL_TABLET | Freq: Two times a day (BID) | ORAL | 3 refills | Status: DC
  Filled 2021-02-14: qty 180, 90d supply, fill #0

## 2021-02-14 NOTE — Patient Instructions (Signed)
Medication Instructions:  STOP Spironolactone  STOP Bidil  START Valsartan 40 mg 2 times a day  DECREASE Carvedilol (Coreg) 3.125 mg 2 times a day  *If you need a refill on your cardiac medications before your next appointment, please call your pharmacy*  Lab Work: Your physician recommends that you return for lab work TODAY:  BMET  Your physician recommends that you return for lab work in 6 weeks-03/28/21:  BMET  If you have labs (blood work) drawn today and your tests are completely normal, you will receive your results only by: [employer] (if you have MyChart) OR A paper copy in the mail If you have any lab test that is abnormal or we need to change your treatment, we will call you to review the results.  Testing/Procedures: NONE ordered at this time of appointment   Follow-Up: At [facility], you and your health needs are our priority.  As part of our continuing mission to provide you with exceptional heart care, we have created designated Provider Care Teams.  These Care Teams include your primary Cardiologist (physician) and Advanced Practice Providers (APPs -  Physician Assistants and Nurse Practitioners) who all work together to provide you with the care you need, when you need it.  We recommend signing up for the patient portal called "MyChart".  Sign up information is provided on this After Visit Summary.  MyChart is used to connect with patients for Virtual Visits (Telemedicine).  Patients are able to view lab/test results, encounter notes, upcoming appointments, etc.  Non-urgent messages can be sent to your provider as well.   To learn more about what you can do with MyChart, go to [email or web address].    Your next appointment:   2-3 month(s)  The format for your next appointment:   In Person  Provider:   Rollene Rotunda, MD  Other Instructions Bring ALL medications bottles with you at your next appointment

## 2021-02-14 NOTE — Progress Notes (Signed)
Cardiology Office Note:    Date:  02/16/2021   ID:  Daryl [name], DOB [birth year], MRN [id number]  PCP:  Patient, No Pcp Per (Inactive)   [facility] Providers Cardiologist:  Rollene Rotunda, MD     Referring MD: No ref. provider found   Chief Complaint  Patient presents with   Follow-up    Seen for Dr. Antoine Poche    History of Present Illness:    Daryl [name] is a 60 y.o. male with a hx of hypertension, EtOH abuse, CKD stage III, CAD and NICM.  Patient was initially admitted to the hospital with sepsis and acute hypoxic respiratory failure in the setting of rhinovirus infection superimposed with bacterial infection in January 2020.  Echocardiogram obtained at that time showed EF 20%.  He also noted to have coronary calcification on CT.  He underwent left and right heart cath that showed nonobstructive CAD with minimal disease, moderate to severe MR, evidence of coronary spasm relieved by nitroglycerin.  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  Patient was placed on BiDil and Lasix.  No ACE inhibitor or ARB due to CKD.  He was last seen by Dr. Antoine Poche on 11/02/2020 after coming off of medication for some time.  He was restarted on heart failure medication, carvedilol was increased to 12.5 mg twice a day.  Patient presents today for follow-up.  He continued to have intermittent shortness of breath.  His compliance with medication is quite questionable.  He says red round pill (which we have identify as BiDil) caused him to have a headache every time he takes it.  And it also messes with his sex life.  He says he is compliant with all 3 medications even though there are 4 medications on his medication list.  He did not bring any of his medication bottles with him.  He says previously he has taken all of the medications together and had a passing out spell.  He also mentions he was extremely hungry that day, so he was not sure what caused the passing  out spell.  [facility], his last pickup of all of his medications were back in December, therefore he is likely not taking them on a daily basis.  This morning, he did not take any of his blood pressure medication and his blood pressure was 134/78.  I am hesitant for him to go back on all the blood pressure medications especially with previous passing out spell.  Therefore I will discontinue his BiDil due to side effect, discontinue spironolactone to make blood pressure room.  We will can switch him to valsartan 40 mg twice a day.  I will also reduce his carvedilol to 3.125 mg twice a day to avoid significant drop in the blood pressure.  If he can demonstrate compliance with the medication, then we will slowly uptitrate heart failure medication on the next follow-up.  There is no plan of repeating echocardiogram at this time given questionable compliance.   Past Medical History:  Diagnosis Date   Acute combined systolic and diastolic heart failure (HCC)    Acute respiratory failure (HCC) 09/11/2018   AKI (acute kidney injury) (HCC) 09/11/2018   Asthma    CKD (chronic kidney disease), stage II 11/02/2018   Dilated cardiomyopathy (HCC)    Educated about COVID-19 virus infection 02/14/2020   Essential hypertension 11/02/2018   ETOH abuse 02/14/2020   NSVT (nonsustained ventricular tachycardia) (  HCC)    PNA (pneumonia) 09/11/2018   Sepsis (HCC) 09/11/2018   Stage 3a chronic kidney disease (HCC) 02/19/2020   Tobacco abuse 09/11/2018    Past Surgical History:  Procedure Laterality Date   IR THORACENTESIS ASP PLEURAL SPACE W/IMG GUIDE  09/13/2018   RIGHT/LEFT HEART CATH AND CORONARY ANGIOGRAPHY N/A 09/15/2018   Procedure: RIGHT/LEFT HEART CATH AND CORONARY ANGIOGRAPHY;  Surgeon: Marykay Lex, MD;  Location: [facility] INVASIVE CV LAB;  Service: Cardiovascular;  Laterality: N/A;    Current Medications: Current Meds  Medication Sig   atorvastatin (LIPITOR)  40 MG tablet TAKE 1 TABLET (40 MG TOTAL) BY MOUTH DAILY AT 6 PM.   folic acid (FOLVITE) 1 MG tablet TAKE 1 TABLET (1 MG TOTAL) BY MOUTH DAILY.   thiamine 100 MG tablet Take 1 tablet (100 mg total) by mouth daily.   valsartan (DIOVAN) 40 MG tablet Take 1 tablet (40 mg total) by mouth 2 (two) times daily.   [DISCONTINUED] carvedilol (COREG) 12.5 MG tablet TAKE 1 TABLET (12.5 MG TOTAL) BY MOUTH IN THE MORNING AND AT BEDTIME.   [DISCONTINUED] isosorbide-hydrALAZINE (BIDIL) 20-37.5 MG tablet TAKE 1 TABLET BY MOUTH 2 (TWO) TIMES DAILY.   [DISCONTINUED] spironolactone (ALDACTONE) 25 MG tablet TAKE 0.5 TABLETS (12.5 MG TOTAL) BY MOUTH DAILY.     Allergies:   Penicillins   Social History   Socioeconomic History   Marital status: Single    Spouse name: Not on file   Number of children: Not on file   Years of education: Not on file   Highest education level: Not on file  Occupational History   Not on file  Tobacco Use   Smoking status: Every Day    Packs/day: 0.50    Pack years: 0.00    Types: Cigarettes   Smokeless tobacco: Never  Vaping Use   Vaping Use: Never used  Substance and Sexual Activity   Alcohol use: Yes   Drug use: Never   Sexual activity: Not on file  Other Topics Concern   Not on file  Social History Narrative   Not on file   Social Determinants of Health   Financial Resource Strain: Not on file  Food Insecurity: Not on file  Transportation Needs: Not on file  Physical Activity: Not on file  Stress: Not on file  Social Connections: Not on file     Family History: The patient's family history is not on file.  ROS:   Please see the history of present illness.     All other systems reviewed and are negative.  EKGs/Labs/Other Studies Reviewed:    The following studies were reviewed today:  Echo 09/13/2018 LV EF: 20%   -------------------------------------------------------------------  Indications:      CHF - 428.0.    -------------------------------------------------------------------  Study Conclusions   - Left ventricle: The cavity size was mildly dilated. Wall    thickness was normal. The estimated ejection fraction was 20%.    Diffuse hypokinesis. Doppler parameters are consistent with    restrictive physiology, indicative of decreased left ventricular    diastolic compliance and/or increased left atrial pressure.  - Aortic valve: There was no stenosis.  - Mitral valve: There was moderate to severe central regurgitation,    probably functional.  - Left atrium: The atrium was severely dilated.  - Right ventricle: The cavity size was mildly dilated. Systolic    function was mildly reduced.  - Right atrium: The atrium was moderately dilated.  - Tricuspid valve: There was moderate-severe  regurgitation.    Visually looks moderate but there is systolic flow reversal in    the hepatic vein doppler pattern. Peak RV-RA gradient (S): 25 mm    Hg.  - Pulmonary arteries: PA peak pressure: 40 mm Hg (S).  - Systemic veins: IVC measured 2.25 cm with < 50% respirophasic    variation, suggesting RA pressure 15 mmHg.   Impressions:   - Mildly dilated LV with severe diffuse hypokinesis, EF 20%.    Restrictive diastolic function. Mildly dilated RV with mildly    decreased systolic function. Biatrial enlargement.    Moderate-severe central MR, probably function. Moderate-severe    TR. Mild pulmonary hypertension. Dilated IVC suggestive of    elevated RV filling presure.   EKG:  EKG is not ordered today.    Recent Labs: 02/20/2020: ALT 21; BUN 15; Creatinine, Ser 1.35; Hemoglobin 14.6; Platelets 333; Potassium 4.5; Sodium 141  Recent Lipid Panel    Component Value Date/Time   [name] 127 09/15/2018 0441   TRIG 124 09/15/2018 0441   HDL 33 (L) 09/15/2018 0441   CHOLHDL 3.8 09/15/2018 0441   VLDL 25 09/15/2018 0441   LDLCALC 69 09/15/2018 0441     Risk Assessment/Calculations:           Physical  Exam:    VS:  BP 134/78   Pulse 89   Ht 5\' 9"  (1.753 m)   Wt 187 lb (84.8 kg)   SpO2 96%   BMI 27.62 kg/m     Wt Readings from Last 3 Encounters:  02/14/21 187 lb (84.8 kg)  11/02/20 189 lb (85.7 kg)  08/08/20 183 lb 12.8 oz (83.4 kg)     GEN:  Well nourished, well developed in no acute distress HEENT: Normal NECK: No JVD; No carotid bruits LYMPHATICS: No lymphadenopathy CARDIAC: RRR, no murmurs, rubs, gallops RESPIRATORY:  Clear to auscultation without rales, wheezing or rhonchi  ABDOMEN: Soft, non-tender, non-distended MUSCULOSKELETAL:  No edema; No deformity  SKIN: Warm and dry NEUROLOGIC:  Alert and oriented x 3 PSYCHIATRIC:  Normal affect   ASSESSMENT:    1. NICM (nonischemic cardiomyopathy) (HCC)   2. Medication management   3. Essential hypertension   4. Stage 3 chronic kidney disease, unspecified whether stage 3a or 3b CKD (HCC)   5. Coronary artery disease involving native coronary artery of native heart without angina pectoris    PLAN:    In order of problems listed above:  Nonischemic cardiomyopathy: Previous cardiac catheterization did not reveal significant coronary artery disease.  Nonischemic cardiomyopathy attributed to either viral myocarditis or alcohol abuse.  Compliance with heart failure medication is very questionable in this case.  Even though he says he is taking all 3 medications, he actually had 4 medications on his medication list.  [facility] in December of last year.  He says he has significant headache with BiDil and it interfere with him using Viagra.  Given questionable compliance, we have decided to take him off of BiDil and the spironolactone, will start him on low-dose valsartan 40 mg daily and reduce carvedilol down to 3.125 mg twice a day to avoid significant hypotension.  Patient says he had a previous passing out spell when he took off his blood  pressure at once, I suspect with noncompliance, it was hard to measure his blood pressure after medication, therefore he was prescribed too much medication.  Hypertension: He did not take  any of his heart failure medications this morning, systolic blood pressure in the 130s  CKD stage III: Basic metabolic panel today and also in 1 week  Mild nonobstructive CAD: Seen on previous cardiac catheterization.  Denies any chest pain        Medication Adjustments/Labs and Tests Ordered: Current medicines are reviewed at length with the patient today.  Concerns regarding medicines are outlined above.  Orders Placed This Encounter  Procedures   Basic metabolic panel   Basic metabolic panel   Meds ordered this encounter  Medications   valsartan (DIOVAN) 40 MG tablet    Sig: Take 1 tablet (40 mg total) by mouth 2 (two) times daily.    Dispense:  180 tablet    Refill:  3   carvedilol (COREG) 3.125 MG tablet    Sig: Take 1 tablet (3.125 mg total) by mouth 2 (two) times daily with a meal.    Dispense:  180 tablet    Refill:  3    Patient Instructions  Medication Instructions:  STOP Spironolactone  STOP Bidil  START Valsartan 40 mg 2 times a day  DECREASE Carvedilol (Coreg) 3.125 mg 2 times a day  *If you need a refill on your cardiac medications before your next appointment, please call your pharmacy*  Lab Work: Your physician recommends that you return for lab work TODAY:  BMET  Your physician recommends that you return for lab work in 6 weeks-03/28/21:  BMET  If you have labs (blood work) drawn today and your tests are completely normal, you will receive your results only by: [employer] (if you have MyChart) OR A paper copy in the mail If you have any lab test that is abnormal or we need to change your treatment, we will call you to review the results.  Testing/Procedures: NONE ordered at this time of appointment   Follow-Up: At [facility], you and your health needs  are our priority.  As part of our continuing mission to provide you with exceptional heart care, we have created designated Provider Care Teams.  These Care Teams include your primary Cardiologist (physician) and Advanced Practice Providers (APPs -  Physician Assistants and Nurse Practitioners) who all work together to provide you with the care you need, when you need it.  We recommend signing up for the patient portal called "MyChart".  Sign up information is provided on this After Visit Summary.  MyChart is used to connect with patients for Virtual Visits (Telemedicine).  Patients are able to view lab/test results, encounter notes, upcoming appointments, etc.  Non-urgent messages can be sent to your provider as well.   To learn more about what you can do with MyChart, go to [email or web address].    Your next appointment:   2-3 month(s)  The format for your next appointment:   In Person  Provider:   Rollene Rotunda, MD  Other Instructions Bring ALL medications bottles with you at your next appointment    Signed, Azalee Course, PA  02/16/2021 11:20 PM     [facility]

## 2021-02-15 ENCOUNTER — Other Ambulatory Visit: Payer: Self-pay

## 2021-02-16 ENCOUNTER — Encounter: Payer: Self-pay | Admitting: Physician Assistant

## 2021-02-18 ENCOUNTER — Other Ambulatory Visit: Payer: Self-pay

## 2021-02-20 ENCOUNTER — Other Ambulatory Visit: Payer: Self-pay

## 2021-04-05 ENCOUNTER — Other Ambulatory Visit (HOSPITAL_COMMUNITY): Payer: Self-pay

## 2021-05-06 ENCOUNTER — Other Ambulatory Visit: Payer: Self-pay

## 2021-05-06 DIAGNOSIS — Z79899 Other long term (current) drug therapy: Secondary | ICD-10-CM

## 2021-05-07 LAB — BASIC METABOLIC PANEL
BUN/Creatinine Ratio: 7 — ABNORMAL LOW (ref 9–20)
BUN: 10 mg/dL (ref 6–24)
CO2: 18 mmol/L — ABNORMAL LOW (ref 20–29)
Calcium: 8.9 mg/dL (ref 8.7–10.2)
Chloride: 103 mmol/L (ref 96–106)
Creatinine, Ser: 1.39 mg/dL — ABNORMAL HIGH (ref 0.76–1.27)
Glucose: 165 mg/dL — ABNORMAL HIGH (ref 65–99)
Potassium: 4.5 mmol/L (ref 3.5–5.2)
Sodium: 140 mmol/L (ref 134–144)
eGFR: 58 mL/min/{1.73_m2} — ABNORMAL LOW (ref >59)

## 2021-05-16 NOTE — Progress Notes (Signed)
Cardiology Office Note   Date:  05/17/2021   ID:  Daryl [name], DOB March 15, 1961, MRN [id number]  PCP:  Patient, No Pcp Per (Inactive)  Cardiologist:   Minus Breeding, MD   Chief Complaint  Patient presents with   Cardiomyopathy       History of Present Illness: Daryl [name] is a 60 y.o. male who presents for  follow up post sepsis and acute hypoxic respiratory failure in the setting of rhinovirus infection, superimposed on bacterial infection.   This was in Jan 2020.  EF was 20%.  He had coronary calcification on CT.  He underwent a RHC/LHC revealed non-obstructive CAD with no more than 20% stenosis in any of his coronary arteries. He had moderate to severe MR. There was evidence of coronary spasm relieved with NTG. 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 He was placed on Bidil and lasix. No ACE or ARB due to CKD.    He has not presented for follow up and has run out of medications.  He did not want to take BiDil because of headache.  He was also taken off o spironolactone.  He comes in today mostly complaining of bowel complaints.  He is got some left lower quadrant mild discomfort.  He talks about when he stands up he feels like he has to go to the bathroom.  He seems to be describing more bowel movements but he is not really saying it is diarrhea.  He is not really describing frequent urination.  He does not have PND or orthopnea.  He did have a frank syncopal episode by his report in a grocery store not long ago.  He does say that he still drinking alcohol.  He says he has not had anything to drink yet today.  It sounds like he is taking the 2 medications listed but at times he was saying that he does not like to take them and he did not take them yet today because he does not like how they make him feel.  He has not been having any palpitations.  He is not describing any new chest pressure, neck or arm discomfort.  He is not having any new edema.   Past Medical  History:  Diagnosis Date   Acute combined systolic and diastolic heart failure (HCC)    Acute respiratory failure ([place]) 09/11/2018   AKI (acute kidney injury) ([place]) 09/11/2018   Asthma    CKD (chronic kidney disease), stage II 11/02/2018   Dilated cardiomyopathy ([place])    Educated about COVID-19 virus infection 02/14/2020   Essential hypertension 11/02/2018   ETOH abuse 02/14/2020   NSVT (nonsustained ventricular tachycardia) (HCC)    PNA (pneumonia) 09/11/2018   Sepsis ([place]) 09/11/2018   Stage 3a chronic kidney disease ([place]) 02/19/2020   Tobacco abuse 09/11/2018    Past Surgical History:  Procedure Laterality Date   IR THORACENTESIS ASP PLEURAL SPACE W/IMG GUIDE  09/13/2018   RIGHT/LEFT HEART CATH AND CORONARY ANGIOGRAPHY N/A 09/15/2018   Procedure: RIGHT/LEFT HEART CATH AND CORONARY ANGIOGRAPHY;  Surgeon: Leonie Man, MD;  Location: [facility] CV LAB;  Service: Cardiovascular;  Laterality: N/A;     Current Outpatient Medications  Medication Sig Dispense Refill   atorvastatin (LIPITOR) 40 MG tablet TAKE 1 TABLET (40 MG TOTAL) BY MOUTH DAILY AT 6 PM. 90 tablet 3   carvedilol (COREG) 6.25 MG tablet Take 1 tablet (6.25 mg total) by mouth 2 (two) times daily with  a meal. 248 tablet 3   folic acid (FOLVITE) 1 MG tablet TAKE 1 TABLET (1 MG TOTAL) BY MOUTH DAILY. (Patient not taking: Reported on 05/17/2021) 30 tablet 0   thiamine 100 MG tablet Take 1 tablet (100 mg total) by mouth daily. (Patient not taking: Reported on 05/17/2021) 30 tablet 0   valsartan (DIOVAN) 40 MG tablet Take 1 tablet (40 mg total) by mouth 2 (two) times daily. (Patient not taking: Reported on 05/17/2021) 180 tablet 3   No current facility-administered medications for this visit.    Allergies:   Penicillins    ROS:  Please see the history of present illness.   Otherwise, review of systems are positive for mucous in his none .   All other systems are reviewed and negative.    PHYSICAL has been back on him now just to  be met: VS:  BP 128/86 (BP Location: Left Arm, Patient Position: Sitting, Cuff Size: Normal)   Pulse 97   Ht _0  (1.753 m)   Wt 177 lb (80.3 kg)   BMI 26.14 kg/m  , BMI Body mass index is 26.14 kg/m. GENERAL:  Well appearing NECK: Positive jugular venous distention to the jaw at 45 degrees, waveform within normal limits, carotid upstroke brisk and symmetric, no bruits, no thyromegaly LUNGS:  Clear to auscultation bilaterally CHEST:  Unremarkable HEART:  PMI not displaced or sustained,S1 and S2 within normal limits, no S3, no S4, no clicks, no rubs, no murmurs ABD:  Flat, positive bowel sounds normal in frequency in pitch, no bruits, no rebound, no guarding, no midline pulsatile mass, positive hepatomegaly, no splenomegaly EXT:  2 plus pulses throughout, no edema, no cyanosis no clubbing  EKG:  EKG is  ordered today. Sinus rhythm, rate 97, axis within normal limits, , QTC is prolonged, no acute ST-T wave changes, nonspecific lateral T wave changes unchanged from previous.  Poor anterior R wave progression, possible left atrial enlargement  Recent Labs: 05/06/2021: BUN 10; Creatinine, Ser 1.39; Potassium 4.5; Sodium 140    Lipid Panel    Component Value Date/Time   CHOL 127 09/15/2018 0441   TRIG 124 09/15/2018 0441   HDL 33 (L) 09/15/2018 0441   CHOLHDL 3.8 09/15/2018 0441   VLDL 25 09/15/2018 0441   LDLCALC 69 09/15/2018 0441      Wt Readings from Last 3 Encounters:  05/17/21 177 lb (80.3 kg)  02/14/21 187 lb (84.8 kg)  11/02/20 189 lb (85.7 kg)      Other studies Reviewed: Additional studies/ records that were reviewed today include:   None. Review of the above records demonstrates:  NA    ASSESSMENT AND PLAN:    NICM:   EF of 20%.  I suspect he has had progression of his cardiomyopathy in part because he still drinking alcohol and because he has been nonadherent with his medications.  He is actually oxygenating well.  He is not particular hypotensive.  He does  have evidence of volume overload with history of venous distention and ectatic enlargement.  However, no seen indication for acute heart failure though he may have some low output symptoms.  I am going to start with labs to include TSH CBC and c-Met.  He is urged to be compliant with the medications as listed.  He is urged to come back for an echocardiogram and follow-up after this.  Syncope:  I am going to try to monitor him with a 3 day Zio.  This might be problematic because  of his social circumstances.  Of note he was not orthostatic in the office today.  Hypertension:   This is being managed in the context of treating his CHF.  I will increase the Coreg to 6.25 bid.     CKD:    CKD IIIa.  I will check this today.   ETOH Abuse: We have talked at length previously about the need to stop drinking completely.  Current medicines are reviewed at length with the patient today.  The patient does not have concerns regarding medicines.  The following changes have been made:  As above  Labs/ tests ordered today include:    Orders Placed This Encounter  Procedures   CBC with Differential/Platelet   TSH   Comprehensive metabolic panel   LONG TERM MONITOR (3-14 DAYS)   EKG 12-Lead   ECHOCARDIOGRAM COMPLETE     Disposition:   FU with me or APP in a couple of weeks.  He is to present to the emergency room with any further syncopal episodes.    Signed, Minus Breeding, MD  05/17/2021 12:15 PM    [facility]

## 2021-05-17 ENCOUNTER — Ambulatory Visit (INDEPENDENT_AMBULATORY_CARE_PROVIDER_SITE_OTHER): Payer: Medicaid Other | Admitting: Cardiology

## 2021-05-17 ENCOUNTER — Ambulatory Visit: Payer: Medicaid Other

## 2021-05-17 ENCOUNTER — Encounter: Payer: Self-pay | Admitting: Cardiology

## 2021-05-17 ENCOUNTER — Other Ambulatory Visit: Payer: Self-pay

## 2021-05-17 VITALS — BP 128/86 | HR 97 | Ht 69.0 in | Wt 177.0 lb

## 2021-05-17 DIAGNOSIS — I10 Essential (primary) hypertension: Secondary | ICD-10-CM

## 2021-05-17 DIAGNOSIS — N183 Chronic kidney disease, stage 3 unspecified: Secondary | ICD-10-CM | POA: Diagnosis not present

## 2021-05-17 DIAGNOSIS — I428 Other cardiomyopathies: Secondary | ICD-10-CM | POA: Diagnosis not present

## 2021-05-17 DIAGNOSIS — R55 Syncope and collapse: Secondary | ICD-10-CM

## 2021-05-17 MED ORDER — CARVEDILOL 6.25 MG PO TABS
6.2500 mg | ORAL_TABLET | Freq: Two times a day (BID) | ORAL | 3 refills | Status: DC
Start: 2021-05-17 — End: 2021-07-05

## 2021-05-17 NOTE — Patient Instructions (Addendum)
Medication Instructions:  INCREASE YOUR CARVEDILOL 6.25 MG TWICE A DAY   *If you need a refill on your cardiac medications before your next appointment, please call your pharmacy*  Lab Work: CMET/CBC/TSH TODAY   If you have labs (blood work) drawn today and your tests are completely normal, you will receive your results only by: MyChart Message (if you have MyChart) OR A paper copy in the mail If you have any lab test that is abnormal or we need to change your treatment, we will call you to review the results.  Testing/Procedures: Your physician has requested that you have an echocardiogram. Echocardiography is a painless test that uses sound waves to create images of your heart. It provides your doctor with information about the size and shape of your heart and how well your heart's chambers and valves are working. This procedure takes approximately one hour. There are no restrictions for this procedure.  [facility] AT [address]   3 DAY ZIO  TO BE SCHEDULED AT [address] OFFICE   Follow-Up: At [facility], you and your health needs are our priority.  As part of our continuing mission to provide you with exceptional heart care, we have created designated Provider Care Teams.  These Care Teams include your primary Cardiologist (physician) and Advanced Practice Providers (APPs -  Physician Assistants and Nurse Practitioners) who all work together to provide you with the care you need, when you need it.  We recommend signing up for the patient portal called "MyChart".  Sign up information is provided on this After Visit Summary.  MyChart is used to connect with patients for Virtual Visits (Telemedicine).  Patients are able to view lab/test results, encounter notes, upcoming appointments, etc.  Non-urgent messages can be sent to your provider as well.   To learn more about what you can do with MyChart, go to [email or web address].    Your next appointment:   NP/PA AFTER  ECHO  IN PERSON

## 2021-05-17 NOTE — Progress Notes (Unsigned)
Enrolled patient for a 3 day Zio XT monitor to be mailed to patients home   Monitor was marked lost in zio/never returned. Order will be cancelled

## 2021-05-18 LAB — COMPREHENSIVE METABOLIC PANEL
ALT: 36 IU/L (ref 0–44)
AST: 34 IU/L (ref 0–40)
Albumin/Globulin Ratio: 1.6 (ref 1.2–2.2)
Albumin: 4 g/dL (ref 3.8–4.9)
Alkaline Phosphatase: 125 IU/L — ABNORMAL HIGH (ref 44–121)
BUN/Creatinine Ratio: 8 — ABNORMAL LOW (ref 9–20)
BUN: 12 mg/dL (ref 6–24)
Bilirubin Total: 1.5 mg/dL — ABNORMAL HIGH (ref 0.0–1.2)
CO2: 21 mmol/L (ref 20–29)
Calcium: 9.7 mg/dL (ref 8.7–10.2)
Chloride: 104 mmol/L (ref 96–106)
Creatinine, Ser: 1.48 mg/dL — ABNORMAL HIGH (ref 0.76–1.27)
Globulin, Total: 2.5 g/dL (ref 1.5–4.5)
Glucose: 103 mg/dL — ABNORMAL HIGH (ref 65–99)
Potassium: 5.4 mmol/L — ABNORMAL HIGH (ref 3.5–5.2)
Sodium: 143 mmol/L (ref 134–144)
Total Protein: 6.5 g/dL (ref 6.0–8.5)
eGFR: 54 mL/min/{1.73_m2} — ABNORMAL LOW (ref >59)

## 2021-05-18 LAB — CBC WITH DIFFERENTIAL/PLATELET
Basophils Absolute: 0.1 10*3/uL (ref 0.0–0.2)
Basos: 1 %
EOS (ABSOLUTE): 0.1 10*3/uL (ref 0.0–0.4)
Eos: 1 %
Hematocrit: 50.7 % (ref 37.5–51.0)
Hemoglobin: 16 g/dL (ref 13.0–17.7)
Immature Grans (Abs): 0 10*3/uL (ref 0.0–0.1)
Immature Granulocytes: 0 %
Lymphocytes Absolute: 3.1 10*3/uL (ref 0.7–3.1)
Lymphs: 35 %
MCH: 28.2 pg (ref 26.6–33.0)
MCHC: 31.6 g/dL (ref 31.5–35.7)
MCV: 89 fL (ref 79–97)
Monocytes Absolute: 0.8 10*3/uL (ref 0.1–0.9)
Monocytes: 10 %
Neutrophils Absolute: 4.6 10*3/uL (ref 1.4–7.0)
Neutrophils: 53 %
Platelets: 413 10*3/uL (ref 150–450)
RBC: 5.68 x10E6/uL (ref 4.14–5.80)
RDW: 12.8 % (ref 11.6–15.4)
WBC: 8.6 10*3/uL (ref 3.4–10.8)

## 2021-05-18 LAB — TSH: TSH: 3.64 u[IU]/mL (ref 0.450–4.500)

## 2021-05-29 ENCOUNTER — Ambulatory Visit (HOSPITAL_COMMUNITY): Payer: Medicaid Other | Attending: Cardiovascular Disease

## 2021-05-29 ENCOUNTER — Encounter (HOSPITAL_COMMUNITY): Payer: Self-pay

## 2021-05-29 NOTE — Progress Notes (Signed)
Verified appointment "no show" status with R. Cathey at 16:00.

## 2021-05-30 ENCOUNTER — Emergency Department (HOSPITAL_COMMUNITY)
Admission: EM | Admit: 2021-05-30 | Discharge: 2021-05-31 | Disposition: A | Discharge status: Home / Self Care | Payer: Medicaid Other | Attending: Student | Admitting: Student

## 2021-05-30 ENCOUNTER — Encounter (HOSPITAL_COMMUNITY): Payer: Self-pay

## 2021-05-30 ENCOUNTER — Emergency Department (HOSPITAL_COMMUNITY): Payer: Medicaid Other

## 2021-05-30 ENCOUNTER — Encounter (HOSPITAL_COMMUNITY): Payer: Self-pay | Admitting: Cardiology

## 2021-05-30 DIAGNOSIS — Z5321 Procedure and treatment not carried out due to patient leaving prior to being seen by health care provider: Secondary | ICD-10-CM | POA: Diagnosis not present

## 2021-05-30 DIAGNOSIS — R079 Chest pain, unspecified: Secondary | ICD-10-CM

## 2021-05-30 DIAGNOSIS — R0602 Shortness of breath: Secondary | ICD-10-CM | POA: Insufficient documentation

## 2021-05-30 DIAGNOSIS — R140 Abdominal distension (gaseous): Secondary | ICD-10-CM | POA: Diagnosis not present

## 2021-05-30 LAB — CBC
HCT: 50.9 % (ref 39.0–52.0)
Hemoglobin: 16 g/dL (ref 13.0–17.0)
MCH: 27.6 pg (ref 26.0–34.0)
MCHC: 31.4 g/dL (ref 30.0–36.0)
MCV: 87.8 fL (ref 80.0–100.0)
Platelets: 338 10*3/uL (ref 150–400)
RBC: 5.8 MIL/uL (ref 4.22–5.81)
RDW: 14 % (ref 11.5–15.5)
WBC: 8 10*3/uL (ref 4.0–10.5)
nRBC: 0 % (ref 0.0–0.2)

## 2021-05-30 LAB — BASIC METABOLIC PANEL
Anion gap: 9 (ref 5–15)
BUN: 18 mg/dL (ref 6–20)
CO2: 25 mmol/L (ref 22–32)
Calcium: 9.1 mg/dL (ref 8.9–10.3)
Chloride: 107 mmol/L (ref 98–111)
Creatinine, Ser: 1.46 mg/dL — ABNORMAL HIGH (ref 0.61–1.24)
GFR, Estimated: 55 mL/min — ABNORMAL LOW (ref >60)
Glucose, Bld: 108 mg/dL — ABNORMAL HIGH (ref 70–99)
Potassium: 4.2 mmol/L (ref 3.5–5.1)
Sodium: 141 mmol/L (ref 135–145)

## 2021-05-30 LAB — TROPONIN I (HIGH SENSITIVITY)
Troponin I (High Sensitivity): 21 ng/L — ABNORMAL HIGH (ref <18)
Troponin I (High Sensitivity): 25 ng/L — ABNORMAL HIGH (ref <18)

## 2021-05-30 LAB — BRAIN NATRIURETIC PEPTIDE: B Natriuretic Peptide: 3571.7 pg/mL — ABNORMAL HIGH (ref 0.0–100.0)

## 2021-05-30 NOTE — ED Provider Notes (Signed)
Emergency Medicine Provider Triage Evaluation Note  Daryl [name] , a 60 y.o. male  was evaluated in triage.  Pt complains of chest pain.  He reports intermittent episodes of the past 3 weeks.  He reports during these episodes he has to take in deep breaths and raise his right arm which usually resolves his chest pain.  He reports he has had 3 such episodes today.  He reports compliance with his medications.  He does report some abdominal distention and productive cough.  Review of Systems  Positive: Chest pain, shortness of breath, productive cough, abdominal distention Negative: Fever, orthopnea, radiation of his chest pain  Physical Exam  BP (!) 126/103 (BP Location: Left Arm)   Pulse (!) 108   Temp 97.9 F (36.6 C) (Oral)   Resp 20   Ht 5\' 9"  (1.753 m)   Wt 89.8 kg   SpO2 99%   BMI 29.24 kg/m  Gen:   Awake, no distress   Resp:  Normal effort  MSK:   Moves extremities without difficulty  Other:  1+ pitting edema just above bilateral ankles.  Abdominal distention  Medical Decision Making  Medically screening exam initiated at 6:07 PM.  Appropriate orders placed.  was informed that the remainder of the evaluation will be completed by another provider, this initial triage assessment does not replace that evaluation, and the importance of remaining in the ED until their evaluation is complete.  Chest pain     [name], PA-C 05/30/21 1814    07/30/21, MD 05/30/21 [phone]

## 2021-05-30 NOTE — ED Triage Notes (Signed)
Pt reports difficulty breathing since starting a new medication (carvedilol) about 3 weeks ago. Pt also reports some bloating, denies chest pain.

## 2021-05-31 NOTE — ED Notes (Addendum)
Pt called 2x for vitals no answer 

## 2021-05-31 NOTE — ED Notes (Signed)
Pt called 2x for vitals no answer 

## 2021-06-01 ENCOUNTER — Encounter (HOSPITAL_COMMUNITY): Payer: Self-pay | Admitting: Emergency Medicine

## 2021-06-01 ENCOUNTER — Emergency Department (HOSPITAL_COMMUNITY): Payer: Medicaid Other

## 2021-06-01 ENCOUNTER — Telehealth: Payer: Self-pay | Admitting: Student

## 2021-06-01 ENCOUNTER — Emergency Department (HOSPITAL_COMMUNITY)
Admission: EM | Admit: 2021-06-01 | Discharge: 2021-06-02 | Disposition: A | Discharge status: Home / Self Care | Payer: Medicaid Other | Attending: Emergency Medicine | Admitting: Emergency Medicine

## 2021-06-01 ENCOUNTER — Other Ambulatory Visit: Payer: Self-pay

## 2021-06-01 DIAGNOSIS — I130 Hypertensive heart and chronic kidney disease with heart failure and stage 1 through stage 4 chronic kidney disease, or unspecified chronic kidney disease: Secondary | ICD-10-CM | POA: Diagnosis not present

## 2021-06-01 DIAGNOSIS — N1831 Chronic kidney disease, stage 3a: Secondary | ICD-10-CM | POA: Diagnosis not present

## 2021-06-01 DIAGNOSIS — R0602 Shortness of breath: Secondary | ICD-10-CM | POA: Diagnosis present

## 2021-06-01 DIAGNOSIS — I5041 Acute combined systolic (congestive) and diastolic (congestive) heart failure: Secondary | ICD-10-CM | POA: Diagnosis not present

## 2021-06-01 DIAGNOSIS — J45909 Unspecified asthma, uncomplicated: Secondary | ICD-10-CM | POA: Insufficient documentation

## 2021-06-01 DIAGNOSIS — R600 Localized edema: Secondary | ICD-10-CM | POA: Insufficient documentation

## 2021-06-01 DIAGNOSIS — F17210 Nicotine dependence, cigarettes, uncomplicated: Secondary | ICD-10-CM | POA: Insufficient documentation

## 2021-06-01 LAB — BRAIN NATRIURETIC PEPTIDE: B Natriuretic Peptide: 2663.4 pg/mL — ABNORMAL HIGH (ref 0.0–100.0)

## 2021-06-01 LAB — COMPREHENSIVE METABOLIC PANEL
ALT: 57 U/L — ABNORMAL HIGH (ref 0–44)
AST: 56 U/L — ABNORMAL HIGH (ref 15–41)
Albumin: 3.3 g/dL — ABNORMAL LOW (ref 3.5–5.0)
Alkaline Phosphatase: 104 U/L (ref 38–126)
Anion gap: 7 (ref 5–15)
BUN: 24 mg/dL — ABNORMAL HIGH (ref 6–20)
CO2: 27 mmol/L (ref 22–32)
Calcium: 8.8 mg/dL — ABNORMAL LOW (ref 8.9–10.3)
Chloride: 108 mmol/L (ref 98–111)
Creatinine, Ser: 1.53 mg/dL — ABNORMAL HIGH (ref 0.61–1.24)
GFR, Estimated: 52 mL/min — ABNORMAL LOW (ref >60)
Glucose, Bld: 94 mg/dL (ref 70–99)
Potassium: 4.7 mmol/L (ref 3.5–5.1)
Sodium: 142 mmol/L (ref 135–145)
Total Bilirubin: 1.4 mg/dL — ABNORMAL HIGH (ref 0.3–1.2)
Total Protein: 6.1 g/dL — ABNORMAL LOW (ref 6.5–8.1)

## 2021-06-01 LAB — CBC
HCT: 52 % (ref 39.0–52.0)
Hemoglobin: 16.3 g/dL (ref 13.0–17.0)
MCH: 27.6 pg (ref 26.0–34.0)
MCHC: 31.3 g/dL (ref 30.0–36.0)
MCV: 88.1 fL (ref 80.0–100.0)
Platelets: 330 10*3/uL (ref 150–400)
RBC: 5.9 MIL/uL — ABNORMAL HIGH (ref 4.22–5.81)
RDW: 14.2 % (ref 11.5–15.5)
WBC: 8.3 10*3/uL (ref 4.0–10.5)
nRBC: 0 % (ref 0.0–0.2)

## 2021-06-01 LAB — TROPONIN I (HIGH SENSITIVITY): Troponin I (High Sensitivity): 22 ng/L — ABNORMAL HIGH (ref <18)

## 2021-06-01 NOTE — ED Triage Notes (Signed)
Pt c/o shob x 1 week. Was seen in ED on 10/6, but LWBS after triage. Hx of CHF, Gerd. Spoke with cardiologist via phone visit and they recommended pt be seen.

## 2021-06-01 NOTE — ED Provider Notes (Signed)
Emergency Medicine Provider Triage Evaluation Note  Daryl [name] , a 60 y.o. male  was evaluated in triage.  Pt complains of sob.  Review of Systems  Positive: Sob, heart burn, fatigue Negative: Fever, cough  Physical Exam  BP (!) 126/102 (BP Location: Left Arm)   Pulse 80   Temp 97.6 F (36.4 C) (Oral)   Resp (!) 24   Ht 5\' 9"  (1.753 m)   Wt 89.8 kg   SpO2 98%   BMI 29.24 kg/m  Gen:   Awake, no distress   Resp:  Normal effort  MSK:   Moves extremities without difficulty  Other:  No obvious peripheral edema  Medical Decision Making  Medically screening exam initiated at 8:31 PM.  Appropriate orders placed.  was informed that the remainder of the evaluation will be completed by another provider, this initial triage assessment does not replace that evaluation, and the importance of remaining in the ED until their evaluation is complete.  Pt is a poor historian, report having intermittent heart burn over 1 month as well as episodic sob.  No fever, cough, for weight gain.     [name], PA-C 06/01/21 2040    2041, MD 06/03/21 [phone]

## 2021-06-01 NOTE — Telephone Encounter (Signed)
   Patient called the answering service for her follow-up of recent ED visit.  Reviewed chart.  He presented to the ED on 05/30/2021 with shortness of breath, abdominal distention, and intermittent chest pain.  High-sensitivity troponin was minimally elevated and flat at 21>>25.  BNP markedly elevated 3,571. Chest x-ray showed probable right basilar atelectasis but no edema.  Patient was seen in triage but left before officially being evaluated.  He is calling today "to see if he can get his script filled."  Called and spoke with patient.  He is currently chest pain-free but sounds like he is still having intermittent chest pain.  He states this started after he started taking a "white" pill.  He is unsure the name of this medicine.  He is still having some shortness of breath and sounds like he is still having some abdominal distention but is difficult to get a good history from him.  He is wondering if he could have reflux and if that is the cause of his chest pain.  I stated that he can try an over-the-counter reflux medication.  However, I recommended he return to the ED for complete evaluation given history of nonischemic cardiomyopathy with EF of 20% on Echo in 2020, symptoms above, and abnormal labs in the ED. It sound like he likely needs IV Lasix. He stated that he would go to the [facility] at some point today. Again, I reiterated my recommendation to come to the ED.  Corrin Parker, PA-C 06/01/2021 10:47 AM Pa

## 2021-06-02 ENCOUNTER — Telehealth: Payer: Self-pay | Admitting: Student

## 2021-06-02 LAB — TROPONIN I (HIGH SENSITIVITY): Troponin I (High Sensitivity): 20 ng/L — ABNORMAL HIGH (ref <18)

## 2021-06-02 MED ORDER — FUROSEMIDE 20 MG PO TABS
20.0000 mg | ORAL_TABLET | Freq: Every day | ORAL | 0 refills | Status: DC
  Filled 2021-06-02: qty 5, 5d supply, fill #0

## 2021-06-02 MED ORDER — FUROSEMIDE 10 MG/ML IJ SOLN
40.0000 mg | INTRAMUSCULAR | Status: AC
  Administered 2021-06-02: 40 mg via INTRAVENOUS
  Filled 2021-06-02: qty 4

## 2021-06-02 NOTE — ED Provider Notes (Signed)
[facility]-EMERGENCY DEPT Provider Note   CSN: [id number] Arrival date & time: 06/01/21  1928     History Chief Complaint  Patient presents with  . Shortness of Breath    Daryl [name] is a 60 y.o. male.  Patient here with past medical history notable for CHF, AKI, hypertension, presents to the emergency department with a chief complaint of shortness of breath.  He states that he has noticed some increased swelling on his legs and abdomen.  He denies taking a fluid pill.  He denies history of CHF, but this is seen in his medical record.  His most recent echo in 2020 showed an EF of 20%.  He denies any fever, chills, or cough.  Denies any other associated symptoms.  The history is provided by the patient. No language interpreter was used.      Past Medical History:  Diagnosis Date  . Acute combined systolic and diastolic heart failure (HCC)   . Acute respiratory failure (HCC) 09/11/2018  . AKI (acute kidney injury) (HCC) 09/11/2018  . Asthma   . 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 Dilated cardiomyopathy (HCC)   . Educated about COVID-19 virus infection 02/14/2020  . Essential hypertension 11/02/2018  . ETOH abuse 02/14/2020  . NSVT (nonsustained ventricular tachycardia)   . PNA (pneumonia) 09/11/2018  . Sepsis (HCC) 09/11/2018  . Stage 3a chronic kidney disease (HCC) 02/19/2020  . Tobacco abuse 09/11/2018    Patient Active Problem List   Diagnosis Date Noted  . Stage 3a chronic kidney disease (HCC) 02/19/2020  . Educated about COVID-19 virus infection 02/14/2020  . ETOH abuse 02/14/2020  . 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 Essential hypertension 11/02/2018  . Dilated cardiomyopathy (HCC)   . NSVT (nonsustained ventricular tachycardia)   . Acute combined systolic and diastolic heart failure (HCC)   . PNA (pneumonia) 09/11/2018  . Tobacco abuse 09/11/2018  . AKI (acute kidney injury) (HCC) 09/11/2018  . Acute respiratory  failure (HCC) 09/11/2018  . Sepsis (HCC) 09/11/2018    Past Surgical History:  Procedure Laterality Date  . IR THORACENTESIS ASP PLEURAL SPACE W/IMG GUIDE  09/13/2018  . RIGHT/LEFT HEART CATH AND CORONARY ANGIOGRAPHY N/A 09/15/2018   Procedure: RIGHT/LEFT HEART CATH AND CORONARY ANGIOGRAPHY;  Surgeon: Marykay Lex, MD;  Location: [facility] INVASIVE CV LAB;  Service: Cardiovascular;  Laterality: N/A;       No family history on file.  Social History   Tobacco Use  . Smoking status: Every Day    Packs/day: 0.50    Types: Cigarettes  . Smokeless tobacco: Never  Vaping Use  . Vaping Use: Never used  Substance Use Topics  . Alcohol use: Yes  . Drug use: Never    Home Medications Prior to Admission medications   Medication Sig Start Date End Date Taking? Authorizing Provider  carvedilol (COREG) 6.25 MG tablet Take 1 tablet (6.25 mg total) by mouth 2 (two) times daily with a meal. 05/17/21  Yes Rollene Rotunda, MD  furosemide (LASIX) 20 MG tablet Take 1 tablet (20 mg total) by mouth daily for 5 days. 06/02/21 06/07/21 Yes Roxy Horseman, PA-C  valsartan (DIOVAN) 40 MG tablet Take 1 tablet (40 mg total) by mouth 2 (two) times daily. 02/14/21  Yes Azalee Course, PA  atorvastatin (LIPITOR) 40 MG tablet TAKE 1 TABLET (40 MG TOTAL) BY MOUTH DAILY AT 6 PM. Patient not taking: No sig reported 08/08/20 08/08/21  Rollene Rotunda, MD  folic  acid (FOLVITE) 1 MG tablet TAKE 1 TABLET (1 MG TOTAL) BY MOUTH DAILY. Patient not taking: No sig reported 12/30/18   Jodelle Gross, NP  thiamine 100 MG tablet Take 1 tablet (100 mg total) by mouth daily. Patient not taking: No sig reported 02/01/20   Rollene Rotunda, MD    Allergies    Penicillins  Review of Systems   Review of Systems  All other systems reviewed and are negative.  Physical Exam Updated Vital Signs BP (!) 127/112   Pulse 92   Temp 97.6 F (36.4 C) (Oral)   Resp 16   Ht 5\' 9"  (1.753 m)   Wt 89.8 kg   SpO2 97%   BMI 29.24 kg/m    Physical Exam Vitals and nursing note reviewed.  Constitutional:      Appearance: He is well-developed.  HENT:     Head: Normocephalic and atraumatic.  Eyes:     Conjunctiva/sclera: Conjunctivae normal.  Cardiovascular:     Rate and Rhythm: Normal rate and regular rhythm.     Heart sounds: No murmur heard. Pulmonary:     Effort: Pulmonary effort is normal. No respiratory distress.     Breath sounds: Normal breath sounds.  Abdominal:     Palpations: Abdomen is soft.     Tenderness: There is no abdominal tenderness.  Musculoskeletal:        General: Normal range of motion.     Cervical back: Neck supple.     Right lower leg: Edema present.     Left lower leg: Edema present.  Skin:    General: Skin is warm and dry.  Neurological:     Mental Status: He is alert and oriented to person, place, and time.  Psychiatric:        Mood and Affect: Mood normal.        Behavior: Behavior normal.    ED Results / Procedures / Treatments   Labs (all labs ordered are listed, but only abnormal results are displayed) Labs Reviewed  CBC - Abnormal; Notable for the following components:      Result Value   RBC 5.90 (*)    All other components within normal limits  COMPREHENSIVE METABOLIC PANEL - Abnormal; Notable for the following components:   BUN 24 (*)    Creatinine, Ser 1.53 (*)    Calcium 8.8 (*)    Total Protein 6.1 (*)    Albumin 3.3 (*)    AST 56 (*)    ALT 57 (*)    Total Bilirubin 1.4 (*)    GFR, Estimated 52 (*)    All other components within normal limits  BRAIN NATRIURETIC PEPTIDE - Abnormal; Notable for the following components:   B Natriuretic Peptide 2,663.4 (*)    All other components within normal limits  TROPONIN I (HIGH SENSITIVITY) - Abnormal; Notable for the following components:   Troponin I (High Sensitivity) 22 (*)    All other components within normal limits  TROPONIN I (HIGH SENSITIVITY) - Abnormal; Notable for the following components:   Troponin I  (High Sensitivity) 20 (*)    All other components within normal limits    EKG None  Radiology DG Chest 2 View  Result Date: 06/01/2021 CLINICAL DATA:  Shortness of breath for 1 week. EXAM: CHEST - 2 VIEW COMPARISON:  05/30/2021 FINDINGS: Mild cardiac enlargement. No vascular congestion, edema, or consolidation. Linear fibrosis or atelectasis in the right lung base is unchanged since prior study. No pleural effusions. No  pneumothorax. Mediastinal contours appear intact. IMPRESSION: Cardiac enlargement. Linear fibrosis or atelectasis in the right lung base is unchanged. No developing consolidation. Electronically Signed   By: Burman Nieves M.D.   On: 06/01/2021 20:36    Procedures Procedures   Medications Ordered in ED Medications  furosemide (LASIX) injection 40 mg (40 mg Intravenous Given 06/02/21 0238)    ED Course  I have reviewed the triage vital signs and the nursing notes.  Pertinent labs & imaging results that were available during my care of the patient were reviewed by me and considered in my medical decision making (see chart for details).    MDM Rules/Calculators/A&P                           Patient here with shortness of breath.  He does have some bilateral lower extremity edema as well as some abdominal swelling.  He is not hypoxic.  His troponins are flat.  BNP is elevated from his baseline.  I suggest that the patient be admitted for CHF exacerbation, but patient would like to be discharged.   I did give him 40 of Lasix IV while in the ED.  He is urinating and is able to ambulate without dropping his O2 sat.  Given that patient wishes to be discharged, I feel that this is reasonable at this time.  Return precautions have been discussed.  Patient is advised to follow-up with his cardiologist. Final Clinical Impression(s) / ED Diagnoses Final diagnoses:  SOB (shortness of breath)    Rx / DC Orders ED Discharge Orders          Ordered    furosemide (LASIX) 20  MG tablet  Daily        06/02/21 0438             Roxy Horseman, PA-C 06/02/21 0442    Horton, Mayer Masker, MD 06/02/21 [phone]

## 2021-06-02 NOTE — Telephone Encounter (Signed)
   Patient called Answering Service with reports of right foot swelling. I spoke with this patient yesterday and it sounded like he was having symptoms of acute CHF (please see telephone note from 06/01/2021 for more information). I recommended he go to the ED for further evaluation which he did. Admission was recommended for IV diuresis but patient declined because he has to be in court this week and wanted to go home. Therefore, he was discharged with Lasix 20mg  x5 day. He now calls with reports of swelling of his right foot. He states his left foot is a little swollen as well but right foot is worse. He also has a little pressure in his right foot. No redness. Swelling extends to the ankles a little bit. Explained that this can be a result of CHF as well. He states his breathing is OK right now and denies any orthopnea. No current chest pain. Emphasized the importance of taking PO Lasix as prescribed (His Pharmacy is closed today but told him to pick it up tomorrow morning. He received a dose of IV Lasix in the ED this morning so that should be OK). Also emphasized the importance of going to the ED if edema worsens or if he develops worsening shortness of breath or chest pain. Patient voiced understanding.  Will also route note to triage and scheduling pools to see if we can get him a office with Dr. or any APP as soon as possible.   Antoine Poche, PA-C 06/02/2021 4:14 PM

## 2021-06-03 ENCOUNTER — Other Ambulatory Visit: Payer: Self-pay

## 2021-06-04 NOTE — Telephone Encounter (Signed)
Follow up scheduled 10/27

## 2021-06-08 ENCOUNTER — Telehealth: Payer: Self-pay | Admitting: Student

## 2021-06-08 NOTE — Telephone Encounter (Signed)
   The patient called the after-hours line reporting palpitations earlier this morning which occurred while painting and resolved upon sitting down. He denies any symptoms at this current time and no associated chest pain. He does report intermittent dyspnea on exertion at baseline but denies any specific orthopnea, PND or pitting edema.   In reviewing his medications, he has not yet taken his AM Coreg. He has not been taking this twice daily and it is unclear if he has been taking it at all. We tried to review his medications but his medication bottles are old with labels peeled off.  He was able to find his Coreg bottle and I recommended that he start taking this as a twice daily medication. He does have refills for medications available at his local pharmacy and plans to pick these up early next week. The importance of medication compliance was reviewed given his known cardiomyopathy.   Signed, Ellsworth Lennox, PA-C 06/08/2021, 9:22 AM Pager: [phone]

## 2021-06-08 NOTE — Progress Notes (Deleted)
Cardiology Office Note:    Date:  06/08/2021   ID:  Daryl [name], DOB 1961-03-09, MRN [id number]  PCP:  Patient, No Pcp Per (Inactive)  Cardiologist:  Rollene Rotunda, MD  Electrophysiologist:  None   Referring MD: No ref. provider found   Chief Complaint: follow-up of CHF and syncope  History of Present Illness:    Daryl [name] is a 60 y.o. 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 Antoine Poche and presents today for follow-up of CHF and syncope.  Patient was first seen by Cardiology during an admission in 08/2020 sepsis and acute hypoxic respiratory failure in the setting of rhinovirus infection with superimposed bacterial infection. Echo during this admission shoed LVEF of 20% with diffuse hypokinesis, grade 3 diastolic dysfunction (restrictive physiology), moderate to severe MR (likely functional), moderate to severe TR, and mild pulmonary hypertension. He underwent R/LHC which showed mild non-obstructive CAD but he was noted to have coronary spasm which was relived with Nitro. Non-ischemic cardiomyopathy was felt to be secondary to alcohol abuse and viral myocarditis. He was started on GDMT with Coreg and Bidil but no ACE/ARB due to CKD. Valsartan was later added.  Patient was last seen by Dr. Antoine Poche on 05/17/2021 at which time patient reported running out of all his medications. He did not want to take Bidil due to headaches with this and had reportedly been taken of Spironolactone. He was complaining of left lower quadrant abdominal pain and more frequent bowel movements at this visit. He also reported a recent syncopal episode while at the grocery store. He still reported drinking alcohol at that time. Orthostatics were negative in the office. Echo and 3 day Zio monitor  were ordered for further evaluation and he was counseled on the importance completely abstaining from alcohol.   Since last visit, he has had multiple ED visit. He presented to the ED on 05/30/2021 with reports of intermittent atypical chest pain, shortness of breath, and abdominal distension. EKG showed no acute changes. High-sensitivity troponin was minimally elevated and flat at 21 >> 25. BNP was markedly elevated at 3,571. Unfortunately, he left before being seen by a provider due to the wait. He called our after-hours line on 06/01/2021 with continued symptoms and was advised to go back to the ED for likely admission for IV diuresis. He presented back to the ED as directed and admission for acute CHF was recommended but patient declined. Therefore, he was given a dose of IV Lasix and prescribed PO Lasix and advised to follow-up with Cardiology. He called our after-hours line again the following day with reports of lower extremity edema, worse in his right leg. I was working that day and advised patient that this is sign of CHF. I advised him to take the PO Lasix as prescribed and emphasized the importance of going to the ED if symptoms worsened. He called our after-hours line again on 06/08/2021 with reports of palpitations which occurred while painting and then resolved upon sitting down. He had not taken his morning dose of Coreg and it was unclear if he had been taking this at all. He was counseled on the importance on the importance of medication compliance.   Patient presents today for follow-up. ***  Chronic Combined CHF Non-Ischemic Cardiomyopathy - Initial diagnosed in 08/2018 when Echo showed LVEF  of 20% with diffuse hypokinesis and grade 3 diastolic CHF. R/LHC at that time showed mild non-obstructive CAD. Felt to likely be secondary to alcohol abuse or viral myocarditis. He has not had repeat Echo since that time - one was ordered at last visit but he no-showed this appointment. Compliance is an  issue. - Recently seen twice in ED for CHF symptoms. BNP markedly elevated. Admission was recommended but patient declined. *** - Will increase Lasix to 68m daily. - Continue Valsartan 40mg  daily. - Continue Coreg 6.25mg  twice daily.  - Would ideally add SGLT2 inhibitor but concerned about cost and patient already has trouble with being compliant with current medications so will hold off on this.  - Discussed importance of daily weights and sodium/fluid restrictions.   Non-Obstructive CAD Coronary Spasms - R/LHC in 08/2018 showed minimal non-obstructive CAD but did note evidence of coronary spasm which was relieved with Nitro. - *** - Antiplatelet therapy was not felt to be necessary given minimal CAD. - Continue statin.  History of Syncope - Patient reported one syncopal episode at last visit. No recurrence. *** - Echo and 3 day Zio monitor were ordered at last visit but have not been completed. Emphasized the importance of both of these test. ***  Hypertension - BP well controlled. - Continue medications for CHF as above.  Dyslipidemia - Last lipid panel in 08/2018: Total Cholesterol 127, Triglycerides 124, HDL 33, LDL 69.  - Continue Lipitor 40mg  daily. - Will repeat lipid panel and LFTs. ***  CKD Stage III - Baseline creatinine around 1.3 to 1.5.  - Stable at last check on 06/01/2021.   Alcohol Abuse - ***  Past Medical History:  Diagnosis Date   Acute combined systolic and diastolic heart failure (HCC)    Acute respiratory failure (HCC) 09/11/2018   AKI (acute kidney injury) (HCC) 09/11/2018   Asthma    CKD (chronic kidney disease), stage II 11/02/2018   Dilated cardiomyopathy (HCC)    Educated about COVID-19 virus infection 02/14/2020   Essential hypertension 11/02/2018   ETOH abuse 02/14/2020   NSVT (nonsustained ventricular tachycardia)    PNA (pneumonia) 09/11/2018   Sepsis (HCC) 09/11/2018   Stage 3a chronic kidney disease (HCC) 02/19/2020   Tobacco abuse 09/11/2018     Past Surgical History:  Procedure Laterality Date   IR THORACENTESIS ASP PLEURAL SPACE W/IMG GUIDE  09/13/2018   RIGHT/LEFT HEART CATH AND CORONARY ANGIOGRAPHY N/A 09/15/2018   Procedure: RIGHT/LEFT HEART CATH AND CORONARY ANGIOGRAPHY;  Surgeon: 09/15/2018, MD;  Location: [facility] INVASIVE CV LAB;  Service: Cardiovascular;  Laterality: N/A;    Current Medications: No outpatient medications have been marked as taking for the 06/20/21 encounter (Appointment) with [facility], PA-C.     Allergies:   Penicillins   Social History   Socioeconomic History   Marital status: Single    Spouse name: Not on file   Number of children: Not on file   Years of education: Not on file   Highest education level: Not on file  Occupational History   Not on file  Tobacco Use   Smoking status: Every Day    Packs/day: 0.50    Types: Cigarettes   Smokeless tobacco: Never  Vaping Use   Vaping Use: Never used  Substance and Sexual Activity   Alcohol use: Yes   Drug use: Never   Sexual activity: Not on file  Other Topics Concern   Not on file  Social History Narrative   Not on  file   Social Determinants of Health   Financial Resource Strain: Not on file  Food Insecurity: Not on file  Transportation Needs: Not on file  Physical Activity: Not on file  Stress: Not on file  Social Connections: Not on file     Family History: The patient's family history is not on file.  ROS:   Please see the history of present illness.     EKGs/Labs/Other Studies Reviewed:    The following studies were reviewed today:  Echocardiogram 09/13/2018: Study Conclusions: - Left ventricle: The cavity size was mildly dilated. Wall    thickness was normal. The estimated ejection fraction was 20%.    Diffuse hypokinesis. Doppler parameters are consistent with    restrictive physiology, indicative of decreased left ventricular    diastolic compliance and/or increased left atrial pressure.  - Aortic  valve: There was no stenosis.  - Mitral valve: There was moderate to severe central regurgitation,    probably functional.  - Left atrium: The atrium was severely dilated.  - Right ventricle: The cavity size was mildly dilated. Systolic    function was mildly reduced.  - Right atrium: The atrium was moderately dilated.  - Tricuspid valve: There was moderate-severe regurgitation.    Visually looks moderate but there is systolic flow reversal in    the hepatic vein doppler pattern. Peak RV-RA gradient (S): 25 mm    Hg.  - Pulmonary arteries: PA peak pressure: 40 mm Hg (S).  - Systemic veins: IVC measured 2.25 cm with < 50% respirophasic    variation, suggesting RA pressure 15 mmHg.   Impressions:  - Mildly dilated LV with severe diffuse hypokinesis, EF 20%.    Restrictive diastolic function. Mildly dilated RV with mildly    decreased systolic function. Biatrial enlargement.    Moderate-severe central MR, probably function. Moderate-severe    TR. Mild pulmonary hypertension. Dilated IVC suggestive of    elevated RV filling presure. _______________  Right/Left Cardiac Catheterization 09/15/2018: Hemodynamic findings consistent with mild pulmonary hypertension. LV end diastolic pressure is mildly elevated. Prox RCA to Mid RCA lesion is 15% stenosed. Mid RCA lesion is 20% stenosed. Prox LAD lesion is 20% stenosed. Dist RCA lesion is 20% stenosed with 30% stenosed side branch in Ost RPDA.   Summary: Angiographic evidence of coronary spasm relieved with nitroglycerin in the mid and distal RCA otherwise minimal disease in the LAD. NONISCHEMIC CARDIOMYOPATHY Evidence of elevated right-sided filling pressures with an RAP of 14 mmHg and RVEDP of 17 million mercury consistent with LVEDP of 17-20 mmHg.   Recommendations: Patient will return to nursing unit after TR band removal. Continue cardiomyopathy treatment regimen. Appears to be essentially euvolemic by cath.  Diagnostic Dominance:  Right    EKG:  EKG not ordered today.   Recent Labs: 05/17/2021: TSH 3.640 06/01/2021: ALT 57; B Natriuretic Peptide 2,663.4; BUN 24; Creatinine, Ser 1.53; Hemoglobin 16.3; Platelets 330; Potassium 4.7; Sodium 142  Recent Lipid Panel    Component Value Date/Time   [name] 127 09/15/2018 0441   TRIG 124 09/15/2018 0441   HDL 33 (L) 09/15/2018 0441   CHOLHDL 3.8 09/15/2018 0441   VLDL 25 09/15/2018 0441   LDLCALC 69 09/15/2018 0441    Physical Exam:    Vital Signs: There were no vitals taken for this visit.    Wt Readings from Last 3 Encounters:  06/01/21 198 lb (89.8 kg)  05/30/21 198 lb (89.8 kg)  05/17/21 177 lb (80.3 kg)     General:  60 y.o. male in no acute distress. HEENT: Normocephalic and atraumatic. Sclera clear. EOMs intact. Neck: Supple. No carotid bruits. No JVD. Heart: *** RRR. Distinct S1 and S2. No murmurs, gallops, or rubs. Radial and distal pedal pulses 2+ and equal bilaterally. Lungs: No increased work of breathing. Clear to ausculation bilaterally. No wheezes, rhonchi, or rales.  Abdomen: Soft, non-distended, and non-tender to palpation. Bowel sounds present in all 4 quadrants.  MSK: Normal strength and tone for age. *** Extremities: No lower extremity edema.    Skin: Warm and dry. Neuro: Alert and oriented x3. No focal deficits. Psych: Normal affect. Responds appropriately.   Assessment:    No diagnosis found.  Plan:     Disposition: Follow up in ***   Medication Adjustments/Labs and Tests Ordered: Current medicines are reviewed at length with the patient today.  Concerns regarding medicines are outlined above.  No orders of the defined types were placed in this encounter.  No orders of the defined types were placed in this encounter.   There are no Patient Instructions on file for this visit.   Signed, Corrin Parker, PA-C  06/08/2021 9:12 PM    [facility]

## 2021-06-12 ENCOUNTER — Telehealth (HOSPITAL_COMMUNITY): Payer: Self-pay | Admitting: Cardiology

## 2021-06-12 NOTE — Telephone Encounter (Signed)
Just an FYI. We have made several attempts to contact this patient including sending a letter to schedule or reschedule their echocardiogram. We will be removing the patient from the echo WQ.   05/29/21 NO SHOWED -MAILED LETTER [facility]   Thank you  

## 2021-06-14 ENCOUNTER — Telehealth: Payer: Self-pay | Admitting: Cardiology

## 2021-06-14 MED ORDER — FUROSEMIDE 20 MG PO TABS: 20.0000 mg | ORAL_TABLET | Freq: Every day | ORAL | 2 refills | Status: DC | PRN

## 2021-06-14 NOTE — Telephone Encounter (Signed)
   Patient called the after hours line requesting Rx for prn lasix. Prescription sent to preferred pharmacy.   Beatriz Stallion, PA-C 06/14/21; 6:57 PM

## 2021-06-14 NOTE — Telephone Encounter (Signed)
*  STAT* If patient is at the pharmacy, call can be transferred to refill team.   1. Which medications need to be refilled? (please list name of each medication and dose if known) furosemide (LASIX) 20 MG tablet  2. Which pharmacy/location (including street and city if local pharmacy) is medication to be sent to? [facility] on [address]  3. Do they need a 30 day or 90 day supply? 30 day   Out of MEDS

## 2021-06-17 ENCOUNTER — Telehealth: Payer: Self-pay | Admitting: Cardiology

## 2021-06-17 NOTE — Telephone Encounter (Signed)
Patient called after hours service trying to figure out how much a prescription cost. Per the call agent, "caller wants to know how much a  Rx is, did not say what Rx, it was hard to understand the caller"  I called the patient to get clarification but no answer, LVM on his machine.

## 2021-06-17 NOTE — Telephone Encounter (Signed)
Attempted to call patient, left message for patient to call back to office.   

## 2021-06-19 ENCOUNTER — Encounter: Payer: Self-pay | Admitting: Student

## 2021-06-20 ENCOUNTER — Ambulatory Visit: Payer: Medicaid Other | Admitting: Student

## 2021-06-23 ENCOUNTER — Inpatient Hospital Stay (HOSPITAL_COMMUNITY)
Admission: EM | Admit: 2021-06-23 | Discharge: 2021-07-05 | DRG: 291 | Disposition: A | Discharge status: Home / Self Care | Payer: Medicaid Other | Attending: Internal Medicine | Admitting: Internal Medicine

## 2021-06-23 ENCOUNTER — Other Ambulatory Visit: Payer: Self-pay

## 2021-06-23 ENCOUNTER — Observation Stay (HOSPITAL_BASED_OUTPATIENT_CLINIC_OR_DEPARTMENT_OTHER): Payer: Medicaid Other

## 2021-06-23 ENCOUNTER — Emergency Department (HOSPITAL_COMMUNITY): Payer: Medicaid Other

## 2021-06-23 ENCOUNTER — Encounter (HOSPITAL_COMMUNITY): Payer: Self-pay

## 2021-06-23 DIAGNOSIS — I5023 Acute on chronic systolic (congestive) heart failure: Secondary | ICD-10-CM | POA: Diagnosis not present

## 2021-06-23 DIAGNOSIS — I5021 Acute systolic (congestive) heart failure: Secondary | ICD-10-CM

## 2021-06-23 DIAGNOSIS — Z79899 Other long term (current) drug therapy: Secondary | ICD-10-CM

## 2021-06-23 DIAGNOSIS — I130 Hypertensive heart and chronic kidney disease with heart failure and stage 1 through stage 4 chronic kidney disease, or unspecified chronic kidney disease: Principal | ICD-10-CM | POA: Diagnosis present

## 2021-06-23 DIAGNOSIS — E785 Hyperlipidemia, unspecified: Secondary | ICD-10-CM | POA: Diagnosis present

## 2021-06-23 DIAGNOSIS — F10231 Alcohol dependence with withdrawal delirium: Secondary | ICD-10-CM | POA: Diagnosis not present

## 2021-06-23 DIAGNOSIS — R0602 Shortness of breath: Secondary | ICD-10-CM

## 2021-06-23 DIAGNOSIS — T501X6A Underdosing of loop [high-ceiling] diuretics, initial encounter: Secondary | ICD-10-CM | POA: Diagnosis present

## 2021-06-23 DIAGNOSIS — Y92009 Unspecified place in unspecified non-institutional (private) residence as the place of occurrence of the external cause: Secondary | ICD-10-CM

## 2021-06-23 DIAGNOSIS — R0600 Dyspnea, unspecified: Secondary | ICD-10-CM

## 2021-06-23 DIAGNOSIS — I5043 Acute on chronic combined systolic (congestive) and diastolic (congestive) heart failure: Secondary | ICD-10-CM | POA: Diagnosis present

## 2021-06-23 DIAGNOSIS — Z91128 Patient's intentional underdosing of medication regimen for other reason: Secondary | ICD-10-CM

## 2021-06-23 DIAGNOSIS — I472 Ventricular tachycardia, unspecified: Secondary | ICD-10-CM | POA: Diagnosis present

## 2021-06-23 DIAGNOSIS — F10931 Alcohol use, unspecified with withdrawal delirium: Secondary | ICD-10-CM | POA: Clinically undetermined

## 2021-06-23 DIAGNOSIS — Z880 Allergy status to penicillin: Secondary | ICD-10-CM

## 2021-06-23 DIAGNOSIS — I428 Other cardiomyopathies: Secondary | ICD-10-CM

## 2021-06-23 DIAGNOSIS — F17210 Nicotine dependence, cigarettes, uncomplicated: Secondary | ICD-10-CM | POA: Diagnosis present

## 2021-06-23 DIAGNOSIS — I081 Rheumatic disorders of both mitral and tricuspid valves: Secondary | ICD-10-CM | POA: Diagnosis present

## 2021-06-23 DIAGNOSIS — I10 Essential (primary) hypertension: Secondary | ICD-10-CM | POA: Diagnosis not present

## 2021-06-23 DIAGNOSIS — J45909 Unspecified asthma, uncomplicated: Secondary | ICD-10-CM | POA: Diagnosis present

## 2021-06-23 DIAGNOSIS — Z20822 Contact with and (suspected) exposure to covid-19: Secondary | ICD-10-CM | POA: Diagnosis present

## 2021-06-23 DIAGNOSIS — F10121 Alcohol abuse with intoxication delirium: Secondary | ICD-10-CM

## 2021-06-23 DIAGNOSIS — G9341 Metabolic encephalopathy: Secondary | ICD-10-CM | POA: Diagnosis not present

## 2021-06-23 DIAGNOSIS — I2510 Atherosclerotic heart disease of native coronary artery without angina pectoris: Secondary | ICD-10-CM | POA: Diagnosis present

## 2021-06-23 DIAGNOSIS — N1831 Chronic kidney disease, stage 3a: Secondary | ICD-10-CM | POA: Diagnosis present

## 2021-06-23 DIAGNOSIS — R0689 Other abnormalities of breathing: Secondary | ICD-10-CM

## 2021-06-23 LAB — BASIC METABOLIC PANEL
Anion gap: 9 (ref 5–15)
BUN: 12 mg/dL (ref 6–20)
CO2: 25 mmol/L (ref 22–32)
Calcium: 8.8 mg/dL — ABNORMAL LOW (ref 8.9–10.3)
Chloride: 108 mmol/L (ref 98–111)
Creatinine, Ser: 1.07 mg/dL (ref 0.61–1.24)
GFR, Estimated: >60 mL/min (ref >60)
Glucose, Bld: 124 mg/dL — ABNORMAL HIGH (ref 70–99)
Potassium: 3.9 mmol/L (ref 3.5–5.1)
Sodium: 142 mmol/L (ref 135–145)

## 2021-06-23 LAB — CBC WITH DIFFERENTIAL/PLATELET
Abs Immature Granulocytes: 0.03 10*3/uL (ref 0.00–0.07)
Basophils Absolute: 0.1 10*3/uL (ref 0.0–0.1)
Basophils Relative: 1 %
Eosinophils Absolute: 0.1 10*3/uL (ref 0.0–0.5)
Eosinophils Relative: 1 %
HCT: 50.3 % (ref 39.0–52.0)
Hemoglobin: 15.8 g/dL (ref 13.0–17.0)
Immature Granulocytes: 0 %
Lymphocytes Relative: 28 %
Lymphs Abs: 2.4 10*3/uL (ref 0.7–4.0)
MCH: 28.1 pg (ref 26.0–34.0)
MCHC: 31.4 g/dL (ref 30.0–36.0)
MCV: 89.3 fL (ref 80.0–100.0)
Monocytes Absolute: 0.7 10*3/uL (ref 0.1–1.0)
Monocytes Relative: 8 %
Neutro Abs: 5.2 10*3/uL (ref 1.7–7.7)
Neutrophils Relative %: 62 %
Platelets: 322 10*3/uL (ref 150–400)
RBC: 5.63 MIL/uL (ref 4.22–5.81)
RDW: 15.6 % — ABNORMAL HIGH (ref 11.5–15.5)
WBC: 8.4 10*3/uL (ref 4.0–10.5)
nRBC: 0 % (ref 0.0–0.2)

## 2021-06-23 LAB — ECHOCARDIOGRAM COMPLETE
Area-P 1/2: 6.07 cm2
Calc EF: 21.7 %
Height: 69 in
MV M vel: 4.93 m/s
MV Peak grad: 97.2 mmHg
Radius: 0.5 cm
S' Lateral: 5.4 cm
Single Plane A2C EF: 11.8 %
Single Plane A4C EF: 29.7 %
Weight: 3040 oz

## 2021-06-23 LAB — HEPATIC FUNCTION PANEL
ALT: 35 U/L (ref 0–44)
AST: 36 U/L (ref 15–41)
Albumin: 3.3 g/dL — ABNORMAL LOW (ref 3.5–5.0)
Alkaline Phosphatase: 114 U/L (ref 38–126)
Bilirubin, Direct: 0.3 mg/dL — ABNORMAL HIGH (ref 0.0–0.2)
Indirect Bilirubin: 1 mg/dL — ABNORMAL HIGH (ref 0.3–0.9)
Total Bilirubin: 1.3 mg/dL — ABNORMAL HIGH (ref 0.3–1.2)
Total Protein: 6.6 g/dL (ref 6.5–8.1)

## 2021-06-23 LAB — LIPASE, BLOOD: Lipase: 43 U/L (ref 11–51)

## 2021-06-23 LAB — RESP PANEL BY RT-PCR (FLU A&B, COVID) ARPGX2
Influenza A by PCR: NEGATIVE
Influenza B by PCR: NEGATIVE
SARS Coronavirus 2 by RT PCR: NEGATIVE

## 2021-06-23 LAB — MAGNESIUM: Magnesium: 1.8 mg/dL (ref 1.7–2.4)

## 2021-06-23 LAB — POTASSIUM: Potassium: 4.1 mmol/L (ref 3.5–5.1)

## 2021-06-23 LAB — BRAIN NATRIURETIC PEPTIDE: B Natriuretic Peptide: 2185 pg/mL — ABNORMAL HIGH (ref 0.0–100.0)

## 2021-06-23 MED ORDER — ONDANSETRON HCL 4 MG PO TABS: 4.0000 mg | ORAL_TABLET | Freq: Four times a day (QID) | ORAL | Status: DC | PRN

## 2021-06-23 MED ORDER — FUROSEMIDE 10 MG/ML IJ SOLN
40.0000 mg | Freq: Once | INTRAMUSCULAR | Status: AC
  Administered 2021-06-23: 40 mg via INTRAVENOUS
  Filled 2021-06-23: qty 4

## 2021-06-23 MED ORDER — THIAMINE HCL 100 MG PO TABS
100.0000 mg | ORAL_TABLET | Freq: Every day | ORAL | Status: DC
  Administered 2021-06-23 – 2021-06-24 (×2): 100 mg via ORAL
  Filled 2021-06-23 (×2): qty 1

## 2021-06-23 MED ORDER — NITROGLYCERIN 2 % TD OINT
1.0000 [in_us] | TOPICAL_OINTMENT | Freq: Once | TRANSDERMAL | Status: AC
  Administered 2021-06-23: 1 [in_us] via TOPICAL
  Filled 2021-06-23: qty 1

## 2021-06-23 MED ORDER — HYDRALAZINE HCL 20 MG/ML IJ SOLN: 10.0000 mg | Freq: Four times a day (QID) | INTRAMUSCULAR | Status: DC | PRN

## 2021-06-23 MED ORDER — METOPROLOL TARTRATE 5 MG/5ML IV SOLN
5.0000 mg | Freq: Once | INTRAVENOUS | Status: AC
  Administered 2021-06-23: 5 mg via INTRAVENOUS
  Filled 2021-06-23: qty 5

## 2021-06-23 MED ORDER — POTASSIUM CHLORIDE CRYS ER 20 MEQ PO TBCR
20.0000 meq | EXTENDED_RELEASE_TABLET | Freq: Two times a day (BID) | ORAL | Status: DC
  Administered 2021-06-23 – 2021-06-24 (×3): 20 meq via ORAL
  Filled 2021-06-23 (×3): qty 1

## 2021-06-23 MED ORDER — ENOXAPARIN SODIUM 40 MG/0.4ML IJ SOSY
40.0000 mg | PREFILLED_SYRINGE | INTRAMUSCULAR | Status: DC
  Administered 2021-06-23 – 2021-07-05 (×7): 40 mg via SUBCUTANEOUS
  Filled 2021-06-23 (×14): qty 0.4

## 2021-06-23 MED ORDER — FOLIC ACID 1 MG PO TABS
1.0000 mg | ORAL_TABLET | Freq: Every day | ORAL | Status: DC
  Administered 2021-06-23 – 2021-06-24 (×2): 1 mg via ORAL
  Filled 2021-06-23 (×2): qty 1

## 2021-06-23 MED ORDER — ALBUTEROL SULFATE (2.5 MG/3ML) 0.083% IN NEBU: 3.0000 mL | INHALATION_SOLUTION | RESPIRATORY_TRACT | Status: DC | PRN

## 2021-06-23 MED ORDER — IRBESARTAN 75 MG PO TABS
37.5000 mg | ORAL_TABLET | Freq: Two times a day (BID) | ORAL | Status: DC
  Administered 2021-06-23 – 2021-06-25 (×5): 37.5 mg via ORAL
  Filled 2021-06-23 (×5): qty 1

## 2021-06-23 MED ORDER — ACETAMINOPHEN 325 MG PO TABS
650.0000 mg | ORAL_TABLET | Freq: Four times a day (QID) | ORAL | Status: DC | PRN
  Administered 2021-07-01 – 2021-07-04 (×2): 650 mg via ORAL
  Filled 2021-06-23 (×3): qty 2

## 2021-06-23 MED ORDER — CARVEDILOL 3.125 MG PO TABS
6.2500 mg | ORAL_TABLET | Freq: Two times a day (BID) | ORAL | Status: DC
  Administered 2021-06-23 – 2021-06-25 (×4): 6.25 mg via ORAL
  Filled 2021-06-23 (×4): qty 2

## 2021-06-23 MED ORDER — ONDANSETRON HCL 4 MG/2ML IJ SOLN: 4.0000 mg | Freq: Four times a day (QID) | INTRAMUSCULAR | Status: DC | PRN

## 2021-06-23 MED ORDER — FUROSEMIDE 10 MG/ML IJ SOLN
20.0000 mg | Freq: Two times a day (BID) | INTRAMUSCULAR | Status: DC
  Administered 2021-06-23 – 2021-06-29 (×12): 20 mg via INTRAVENOUS
  Filled 2021-06-23 (×12): qty 2

## 2021-06-23 MED ORDER — ATORVASTATIN CALCIUM 40 MG PO TABS
40.0000 mg | ORAL_TABLET | Freq: Every day | ORAL | Status: DC
  Administered 2021-06-23 – 2021-07-01 (×6): 40 mg via ORAL
  Filled 2021-06-23 (×8): qty 1

## 2021-06-23 MED ORDER — ACETAMINOPHEN 650 MG RE SUPP: 650.0000 mg | Freq: Four times a day (QID) | RECTAL | Status: DC | PRN

## 2021-06-23 NOTE — ED Notes (Addendum)
Pt is refusing bloodwork. Pt states "They took 8 tubes this morning they should have gotten it then." Dr. Kerry Hough aware.

## 2021-06-23 NOTE — Progress Notes (Signed)
   06/23/21 2145  Provider Notification  Provider Name/Title Dr. Carren Rang  Date Provider Notified 06/23/21  Time Provider Notified 2145  Notification Type  (amion)  Notification Reason Other (Comment) (9 beat vtach run)  Provider response No new orders (will reorder Mg and bedrest tonight)  Date of Provider Response 06/23/21  Time of Provider Response 2152

## 2021-06-23 NOTE — Progress Notes (Signed)
Lab entered room to draw blood and patient refused, he stated "they have already drawn 8 tubes this morning and if they can not find what they need then I hate it it you not getting no more." Writer and lab tech explained the importance of having blood drawn and patient still refused.

## 2021-06-23 NOTE — Progress Notes (Signed)
Writer notified by tele that patient had a 14 beat run of V-tach. MD Memon aware.

## 2021-06-23 NOTE — Progress Notes (Signed)
   06/23/21 2033  Assess: MEWS Score  Temp 97.6 F (36.4 C)  BP (!) 128/96  Pulse Rate 89  Resp 18  SpO2 99 %  O2 Device Room Air  Assess: MEWS Score  MEWS Temp 0  MEWS Systolic 0  MEWS Pulse 0  MEWS RR 0  MEWS LOC 0  MEWS Score 0  MEWS Score Color Green  Notify: Provider  Provider Name/Title Dr. Carren Rang  Date Provider Notified 06/23/21  Time Provider Notified 2033  Notification Type  (amion)  Notification Reason Other (Comment) (6 beat vtach run)  Provider response No new orders  Date of Provider Response 06/23/21  Time of Provider Response 2046

## 2021-06-23 NOTE — ED Provider Notes (Signed)
[facility] EMERGENCY DEPARTMENT Provider Note   CSN: [id number] Arrival date & time: 06/23/21  4481     History Chief Complaint  Patient presents with   Shortness of Breath    Daryl [name] is a 60 y.o. male.  The history is provided by the patient.  Shortness of Breath Severity:  Severe Onset quality:  Sudden Timing:  Constant Progression:  Worsening Chronicity:  New Relieved by:  Rest Worsened by:  Activity Associated symptoms: cough   Associated symptoms: no chest pain, no fever and no vomiting   Patient with history of nonischemic cardiomyopathy, hypertension, asthma presents with shortness of breath.  Patient reports he woke up feeling short of breath that is worse with ambulation.  He also reports lower extremity edema.  He denies any active chest pain this time.  No fevers or vomiting. Reports he has not been able to start his Lasix    Past Medical History:  Diagnosis Date   Asthma    Chronic combined systolic and diastolic CHF (congestive heart failure) (HCC)    Dyslipidemia    Essential hypertension 11/02/2018   ETOH abuse 02/14/2020   Nonobstructive CAD    Non-obstructive disease on cardiac cath in 08/2018   NSVT (nonsustained ventricular tachycardia)    PNA (pneumonia) 09/11/2018   Sepsis (HCC) 09/11/2018   Stage 3a chronic kidney disease (HCC) 02/19/2020   Tobacco abuse 09/11/2018    Patient Active Problem List   Diagnosis Date Noted   Stage 3a chronic kidney disease (HCC) 02/19/2020   ETOH abuse 02/14/2020   Essential hypertension 11/02/2018   Nonischemic cardiomyopathy (HCC)    Chronic combined systolic and diastolic CHF (congestive heart failure) (HCC)    Tobacco abuse 09/11/2018    Past Surgical History:  Procedure Laterality Date   IR THORACENTESIS ASP PLEURAL SPACE W/IMG GUIDE  09/13/2018   RIGHT/LEFT HEART CATH AND CORONARY ANGIOGRAPHY N/A 09/15/2018   Procedure: RIGHT/LEFT HEART CATH AND CORONARY ANGIOGRAPHY;  Surgeon: Marykay Lex,  MD;  Location: [facility] INVASIVE CV LAB;  Service: Cardiovascular;  Laterality: N/A;       History reviewed. No pertinent family history.  Social History   Tobacco Use   Smoking status: Every Day    Packs/day: 0.50    Types: Cigarettes   Smokeless tobacco: Never  Vaping Use   Vaping Use: Never used  Substance Use Topics   Alcohol use: Yes   Drug use: Never    Home Medications Prior to Admission medications   Medication Sig Start Date End Date Taking? Authorizing Provider  atorvastatin (LIPITOR) 40 MG tablet TAKE 1 TABLET (40 MG TOTAL) BY MOUTH DAILY AT 6 PM. Patient not taking: No sig reported 08/08/20 08/08/21  Rollene Rotunda, MD  carvedilol (COREG) 6.25 MG tablet Take 1 tablet (6.25 mg total) by mouth 2 (two) times daily with a meal. 05/17/21   Rollene Rotunda, MD  folic acid (FOLVITE) 1 MG tablet TAKE 1 TABLET (1 MG TOTAL) BY MOUTH DAILY. Patient not taking: No sig reported 12/30/18   Jodelle Gross, NP  furosemide (LASIX) 20 MG tablet Take 1 tablet (20 mg total) by mouth daily as needed for up to 5 days. 06/14/21 06/19/21  Kroeger, Ovidio Kin., PA-C  thiamine 100 MG tablet Take 1 tablet (100 mg total) by mouth daily. Patient not taking: No sig reported 02/01/20   Rollene Rotunda, MD  valsartan (DIOVAN) 40 MG tablet Take 1 tablet (40 mg total) by mouth 2 (two) times daily. 02/14/21  Almyra Deforest, [place]    Allergies    Penicillins  Review of Systems   Review of Systems  Constitutional:  Positive for fatigue. Negative for fever.  Respiratory:  Positive for cough and shortness of breath.   Cardiovascular:  Positive for leg swelling. Negative for chest pain.  Gastrointestinal:  Negative for vomiting.  All other systems reviewed and are negative.  Physical Exam Updated Vital Signs BP (!) 145/112   Pulse (!) 109   Temp 98.4 F (36.9 C) (Oral)   Resp (!) 21   Ht 1.753 m (5\' 9" )   Wt 86.2 kg   SpO2 99%   BMI 28.06 kg/m   Physical Exam CONSTITUTIONAL: Well developed/well  nourished HEAD: Normocephalic/atraumatic EYES: EOMI/PERRL ENMT: Mucous membranes moist NECK: supple no meningeal signs, JVD noted SPINE/BACK:entire spine nontender CV: S1/S2 noted, no murmurs/rubs/gallops noted LUNGS: Crackles bilaterally, mild tachypnea ABDOMEN: soft, mild diffuse tenderness, no rebound or guarding, bowel sounds noted throughout abdomen GU:no cva tenderness NEURO: Pt is awake/alert/appropriate, moves all extremitiesx4.  No facial droop.   EXTREMITIES: pulses normal/equal, full ROM 2+ symmetric pitting edema bilateral lower extremities No calf tenderness or edema SKIN: warm, color normal PSYCH: no abnormalities of mood noted, alert and oriented to situation  ED Results / Procedures / Treatments   Labs (all labs ordered are listed, but only abnormal results are displayed) Labs Reviewed  BASIC METABOLIC PANEL - Abnormal; Notable for the following components:      Result Value   Glucose, Bld 124 (*)    Calcium 8.8 (*)    All other components within normal limits  CBC WITH DIFFERENTIAL/PLATELET - Abnormal; Notable for the following components:   RDW 15.6 (*)    All other components within normal limits  BRAIN NATRIURETIC PEPTIDE - Abnormal; Notable for the following components:   B Natriuretic Peptide 2,185.0 (*)    All other components within normal limits  HEPATIC FUNCTION PANEL - Abnormal; Notable for the following components:   Albumin 3.3 (*)    Total Bilirubin 1.3 (*)    Bilirubin, Direct 0.3 (*)    Indirect Bilirubin 1.0 (*)    All other components within normal limits  RESP PANEL BY RT-PCR (FLU A&B, COVID) [id number]  LIPASE, BLOOD    EKG EKG Interpretation  Date/Time:  Sunday June 23 2021 04:49:12 EDT Ventricular Rate:  114 PR Interval:  152 QRS Duration: 76 QT Interval:  338 QTC Calculation: 466 R Axis:   49 Text Interpretation: Sinus tachycardia Left atrial enlargement Anterior infarct, old Nonspecific T abnormalities, lateral leads  Interpretation limited secondary to artifact Confirmed by Ripley Fraise [id number]) on 06/23/2021 5:24:34 AM  Radiology DG Chest Port 1 View  Result Date: 06/23/2021 CLINICAL DATA:  Shortness of breath EXAM: PORTABLE CHEST 1 VIEW COMPARISON:  06/01/2021 FINDINGS: Interstitial opacity and small right pleural effusion. Chronic cardiomegaly. IMPRESSION: CHF. Electronically Signed   By: Jorje Guild M.D.   On: 06/23/2021 06:07    Procedures Procedures   Medications Ordered in ED Medications  nitroGLYCERIN (NITROGLYN) 2 % ointment 1 inch (1 inch Topical Given 06/23/21 0641)  furosemide (LASIX) injection 40 mg (40 mg Intravenous Given 06/23/21 0720)    ED Course  I have reviewed the triage vital signs and the nursing notes.  Pertinent labs & imaging results that were available during my care of the patient were reviewed by me and considered in my medical decision making (see chart for details).    MDM Rules/Calculators/A&P  Patient presents with increasing shortness of breath and dyspnea on exertion.  Patient appears to have acute on chronic heart failure exacerbation. Labs are pending at this time.  I personally reviewed the chest x-ray which reveals CHF 7:41 AM Discussed with Dr. Roderic Palau for admission for CHF Final Clinical Impression(s) / ED Diagnoses Final diagnoses:  Acute systolic congestive heart failure [facility])    Rx / DC Orders ED Discharge Orders     None        Ripley Fraise, MD 06/23/21 [phone]

## 2021-06-23 NOTE — ED Triage Notes (Signed)
Pt bib [employer]. Pt woke up with shortness of breath, denies pain. EMS reports pt 02 sat=97%. Pt given ntg x 2.

## 2021-06-23 NOTE — H&P (Signed)
History and Physical    Daryl [name] [id number] DOB: 1960/10/28 DOA: 06/23/2021  PCP: Patient, No Pcp Per (Inactive)  Patient coming from: Home  I have personally briefly reviewed patient's old medical records in [place]  Chief Complaint: Shortness of breath  HPI: Daryl [name] is a 60 y.o. male with medical history significant of nonischemic cardiomyopathy with ejection fraction of 20%, hypertension, presents to the emergency room with complaints of shortness of breath.  Reports that he has noticed worsening lower extremity over the past 3 to 4 days.  He is also developed some shortness of breath, but is now also having orthopnea.  He woke up this morning feeling short of breath.  Denies any chest pain.  He has not been taking his Lasix at home.  Says he does not have any Lasix.  He was evaluated in emergency room where chest x-ray showed CHF.  BNP elevated.  He was noted to be tachypneic, although still on room air.  He received intravenous Lasix with good urine output.  Still has evidence of volume overload.  Admission requested for further diuresis.   Review of Systems: As per HPI otherwise 10 point review of systems negative.    Past Medical History:  Diagnosis Date   Asthma    Chronic combined systolic and diastolic CHF (congestive heart failure) ([facility])    Dyslipidemia    Essential hypertension 11/02/2018   ETOH abuse 02/14/2020   Nonobstructive CAD    Non-obstructive disease on cardiac cath in 08/2018   NSVT (nonsustained ventricular tachycardia)    PNA (pneumonia) 09/11/2018   Sepsis ([facility]) 09/11/2018   Stage 3a chronic kidney disease ([facility]) 02/19/2020   Tobacco abuse 09/11/2018    Past Surgical History:  Procedure Laterality Date   IR THORACENTESIS ASP PLEURAL SPACE W/IMG GUIDE  09/13/2018   RIGHT/LEFT HEART CATH AND CORONARY ANGIOGRAPHY N/A 09/15/2018   Procedure: RIGHT/LEFT HEART CATH AND CORONARY ANGIOGRAPHY;  Surgeon: Leonie Man, MD;  Location: [facility];  Service: Cardiovascular;  Laterality: N/A;    Social History:  reports that he has been smoking cigarettes. He has been smoking an average of .5 packs per day. He has never used smokeless tobacco. He reports current alcohol use. He reports that he does not use drugs.  Allergies  Allergen Reactions   Penicillins Swelling    Did it involve swelling of the face/tongue/throat, SOB, or low BP? Yes Did it involve sudden or severe rash/hives, skin peeling, or any reaction on the inside of your mouth or nose? No Did you need to seek medical attention at a hospital or doctor's office? Yes When did it last happen?    young adult   If all above answers are "NO", may proceed with cephalosporin use.     History reviewed. No pertinent family history.   Prior to Admission medications   Medication Sig Start Date End Date Taking? Authorizing Provider  atorvastatin (LIPITOR) 40 MG tablet TAKE 1 TABLET (40 MG TOTAL) BY MOUTH DAILY AT 6 PM. Patient not taking: No sig reported 08/08/20 08/08/21  Minus Breeding, MD  carvedilol (COREG) 6.25 MG tablet Take 1 tablet (6.25 mg total) by mouth 2 (two) times daily with a meal. 05/17/21   Minus Breeding, MD  folic acid (FOLVITE) 1 MG tablet TAKE 1 TABLET (1 MG TOTAL) BY MOUTH DAILY. Patient not taking: No sig reported 12/30/18   Lendon Colonel, NP  furosemide (LASIX) 20 MG tablet Take 1 tablet (20  mg total) by mouth daily as needed for up to 5 days. 06/14/21 06/19/21  Kroeger, Ovidio Kin., PA-C  thiamine 100 MG tablet Take 1 tablet (100 mg total) by mouth daily. Patient not taking: No sig reported 02/01/20   Rollene Rotunda, MD  valsartan (DIOVAN) 40 MG tablet Take 1 tablet (40 mg total) by mouth 2 (two) times daily. 02/14/21   Azalee Course, PA    Physical Exam: Vitals:   06/23/21 0600 06/23/21 0630 06/23/21 0700 06/23/21 0730  BP: (!) 147/117 (!) 147/116 (!) 145/112 (!) 155/114  Pulse: (!) 111 (!) 107 (!) 109 (!) 110  Resp: (!) 21 (!) 47 (!)  21 (!) 23  Temp:      TempSrc:      SpO2: 99% 100% 99% 96%  Weight:      Height:        Constitutional: NAD, calm, comfortable Eyes: PERRL, lids and conjunctivae normal ENMT: Mucous membranes are moist. Posterior pharynx clear of any exudate or lesions.Normal dentition.  Neck: normal, supple, no masses, no thyromegaly Respiratory: Crackles at bases. Normal respiratory effort. No accessory muscle use.  Cardiovascular: Regular rate and rhythm, no murmurs / rubs / gallops.  1-2+ extremity edema. 2+ pedal pulses. No carotid bruits.  Abdomen: no tenderness, no masses palpated. No hepatosplenomegaly. Bowel sounds positive.  Musculoskeletal: no clubbing / cyanosis. No joint deformity upper and lower extremities. Good ROM, no contractures. Normal muscle tone.  Skin: no rashes, lesions, ulcers. No induration Neurologic: CN 2-12 grossly intact. Sensation intact, DTR normal. Strength 5/5 in all 4.  Psychiatric: Normal judgment and insight. Alert and oriented x 3. Normal mood.    Labs on Admission: I have personally reviewed following labs and imaging studies  CBC: Recent Labs  Lab 06/23/21 0630  WBC 8.4  NEUTROABS 5.2  HGB 15.8  HCT 50.3  MCV 89.3  PLT 322   Basic Metabolic Panel: Recent Labs  Lab 06/23/21 0630  NA 142  K 3.9  CL 108  CO2 25  GLUCOSE 124*  BUN 12  CREATININE 1.07  CALCIUM 8.8*   GFR: Estimated Creatinine Clearance: 80.9 mL/min (by C-G formula based on SCr of 1.07 mg/dL). Liver Function Tests: Recent Labs  Lab 06/23/21 0637  AST 36  ALT 35  ALKPHOS 114  BILITOT 1.3*  PROT 6.6  ALBUMIN 3.3*   Recent Labs  Lab 06/23/21 0637  LIPASE 43   No results for input(s): AMMONIA in the last 168 hours. Coagulation Profile: No results for input(s): INR, PROTIME in the last 168 hours. Cardiac Enzymes: No results for input(s): CKTOTAL, CKMB, CKMBINDEX, TROPONINI in the last 168 hours. BNP (last 3 results) No results for input(s): PROBNP in the last 8760  hours. HbA1C: No results for input(s): HGBA1C in the last 72 hours. CBG: No results for input(s): GLUCAP in the last 168 hours. Lipid Profile: No results for input(s): CHOL, HDL, LDLCALC, TRIG, CHOLHDL, LDLDIRECT in the last 72 hours. Thyroid Function Tests: No results for input(s): TSH, T4TOTAL, FREET4, T3FREE, THYROIDAB in the last 72 hours. Anemia Panel: No results for input(s): VITAMINB12, FOLATE, FERRITIN, TIBC, IRON, RETICCTPCT in the last 72 hours. Urine analysis: No results found for: COLORURINE, APPEARANCEUR, LABSPEC, PHURINE, GLUCOSEU, HGBUR, BILIRUBINUR, KETONESUR, PROTEINUR, UROBILINOGEN, NITRITE, LEUKOCYTESUR  Radiological Exams on Admission: DG Chest Port 1 View  Result Date: 06/23/2021 CLINICAL DATA:  Shortness of breath EXAM: PORTABLE CHEST 1 VIEW COMPARISON:  06/01/2021 FINDINGS: Interstitial opacity and small right pleural effusion. Chronic cardiomegaly. IMPRESSION: CHF. Electronically Signed  By: Jorje Guild M.D.   On: 06/23/2021 06:07    EKG: Independently reviewed.  Sinus tachycardia  Assessment/Plan Active Problems:   Nonischemic cardiomyopathy (HCC)   Essential hypertension   Acute on chronic systolic CHF (congestive heart failure) (HCC)     Acute on chronic systolic congestive heart failure -Prior ejection fraction on echo from 2020 noted to be 20% -We will update echocardiogram -Suspect decompensation related to medication noncompliance -It appears that he has a poor understanding of his underlying disease process as well as his overall medication regimen -Restarted on Coreg, ARB -Continue intravenous diuretics for now -He will need close follow-up with cardiology  Hypertension -Blood pressure elevated, although suspect has not been taking his medications -Restarted on Coreg and ARB  Alcohol use -Reports that he is still drinking, but does not drink on a daily basis -Denies any prior history of withdrawal -We will continue to monitor  DVT  prophylaxis: Lovenox Code Status: Full code Family Communication: Discussed with patient Disposition Plan: Discharge home once volume status has improved Consults called:   Admission status: Observation, telemetry  Kathie Dike MD [facility]   If 7PM-7AM, please contact night-coverage [email or web address]   06/23/2021, 9:33 AM

## 2021-06-23 NOTE — Progress Notes (Signed)
   06/23/21 2238  Notify: Provider  Provider Name/Title Dr. Carren Rang  Date Provider Notified 06/23/21  Time Provider Notified 2240  Notification Type  (amion)  Notification Reason Other (Comment) (6 beats vtach)  Provider response No new orders (notify if greater than 8 beats)  Date of Provider Response 06/23/21  Time of Provider Response 2248

## 2021-06-23 NOTE — Progress Notes (Signed)
*  PRELIMINARY RESULTS* Echocardiogram 2D Echocardiogram has been performed.  Stacey Drain 06/23/2021, 11:29 AM

## 2021-06-24 DIAGNOSIS — I2510 Atherosclerotic heart disease of native coronary artery without angina pectoris: Secondary | ICD-10-CM | POA: Diagnosis present

## 2021-06-24 DIAGNOSIS — I5023 Acute on chronic systolic (congestive) heart failure: Secondary | ICD-10-CM | POA: Diagnosis not present

## 2021-06-24 DIAGNOSIS — I472 Ventricular tachycardia, unspecified: Secondary | ICD-10-CM | POA: Diagnosis present

## 2021-06-24 DIAGNOSIS — Z79899 Other long term (current) drug therapy: Secondary | ICD-10-CM | POA: Diagnosis not present

## 2021-06-24 DIAGNOSIS — N1831 Chronic kidney disease, stage 3a: Secondary | ICD-10-CM | POA: Diagnosis present

## 2021-06-24 DIAGNOSIS — F17210 Nicotine dependence, cigarettes, uncomplicated: Secondary | ICD-10-CM | POA: Diagnosis present

## 2021-06-24 DIAGNOSIS — Z20822 Contact with and (suspected) exposure to covid-19: Secondary | ICD-10-CM | POA: Diagnosis present

## 2021-06-24 DIAGNOSIS — E785 Hyperlipidemia, unspecified: Secondary | ICD-10-CM | POA: Diagnosis present

## 2021-06-24 DIAGNOSIS — I10 Essential (primary) hypertension: Secondary | ICD-10-CM | POA: Diagnosis not present

## 2021-06-24 DIAGNOSIS — Z880 Allergy status to penicillin: Secondary | ICD-10-CM | POA: Diagnosis not present

## 2021-06-24 DIAGNOSIS — I5043 Acute on chronic combined systolic (congestive) and diastolic (congestive) heart failure: Secondary | ICD-10-CM | POA: Diagnosis present

## 2021-06-24 DIAGNOSIS — Z91128 Patient's intentional underdosing of medication regimen for other reason: Secondary | ICD-10-CM | POA: Diagnosis not present

## 2021-06-24 DIAGNOSIS — J45909 Unspecified asthma, uncomplicated: Secondary | ICD-10-CM | POA: Diagnosis present

## 2021-06-24 DIAGNOSIS — F10231 Alcohol dependence with withdrawal delirium: Secondary | ICD-10-CM | POA: Diagnosis not present

## 2021-06-24 DIAGNOSIS — F10931 Alcohol use, unspecified with withdrawal delirium: Secondary | ICD-10-CM | POA: Diagnosis not present

## 2021-06-24 DIAGNOSIS — I428 Other cardiomyopathies: Secondary | ICD-10-CM | POA: Diagnosis present

## 2021-06-24 DIAGNOSIS — G9341 Metabolic encephalopathy: Secondary | ICD-10-CM | POA: Diagnosis not present

## 2021-06-24 DIAGNOSIS — R0602 Shortness of breath: Secondary | ICD-10-CM | POA: Diagnosis present

## 2021-06-24 DIAGNOSIS — Y92009 Unspecified place in unspecified non-institutional (private) residence as the place of occurrence of the external cause: Secondary | ICD-10-CM | POA: Diagnosis not present

## 2021-06-24 DIAGNOSIS — T501X6A Underdosing of loop [high-ceiling] diuretics, initial encounter: Secondary | ICD-10-CM | POA: Diagnosis present

## 2021-06-24 DIAGNOSIS — I081 Rheumatic disorders of both mitral and tricuspid valves: Secondary | ICD-10-CM | POA: Diagnosis present

## 2021-06-24 DIAGNOSIS — I130 Hypertensive heart and chronic kidney disease with heart failure and stage 1 through stage 4 chronic kidney disease, or unspecified chronic kidney disease: Secondary | ICD-10-CM | POA: Diagnosis present

## 2021-06-24 LAB — BASIC METABOLIC PANEL
Anion gap: 8 (ref 5–15)
BUN: 10 mg/dL (ref 6–20)
CO2: 29 mmol/L (ref 22–32)
Calcium: 8.6 mg/dL — ABNORMAL LOW (ref 8.9–10.3)
Chloride: 101 mmol/L (ref 98–111)
Creatinine, Ser: 1.1 mg/dL (ref 0.61–1.24)
GFR, Estimated: >60 mL/min (ref >60)
Glucose, Bld: 74 mg/dL (ref 70–99)
Potassium: 3.5 mmol/L (ref 3.5–5.1)
Sodium: 138 mmol/L (ref 135–145)

## 2021-06-24 LAB — MAGNESIUM: Magnesium: 2 mg/dL (ref 1.7–2.4)

## 2021-06-24 LAB — HIV ANTIBODY (ROUTINE TESTING W REFLEX): HIV Screen 4th Generation wRfx: NONREACTIVE

## 2021-06-24 MED ORDER — THIAMINE HCL 100 MG/ML IJ SOLN
100.0000 mg | Freq: Every day | INTRAMUSCULAR | Status: DC
  Administered 2021-06-28 – 2021-06-30 (×3): 100 mg via INTRAVENOUS
  Filled 2021-06-24 (×3): qty 2

## 2021-06-24 MED ORDER — LEVALBUTEROL HCL 0.63 MG/3ML IN NEBU: 0.6300 mg | INHALATION_SOLUTION | Freq: Four times a day (QID) | RESPIRATORY_TRACT | Status: DC | PRN

## 2021-06-24 MED ORDER — MAGNESIUM SULFATE IN D5W 1-5 GM/100ML-% IV SOLN
1.0000 g | Freq: Once | INTRAVENOUS | Status: AC
  Administered 2021-06-24: 1 g via INTRAVENOUS
  Filled 2021-06-24: qty 100

## 2021-06-24 MED ORDER — LORAZEPAM 2 MG/ML IJ SOLN
1.0000 mg | Freq: Once | INTRAMUSCULAR | Status: AC
  Administered 2021-06-24: 1 mg via INTRAVENOUS
  Filled 2021-06-24: qty 1

## 2021-06-24 MED ORDER — THIAMINE HCL 100 MG PO TABS
100.0000 mg | ORAL_TABLET | Freq: Every day | ORAL | Status: DC
  Administered 2021-06-24 – 2021-07-04 (×6): 100 mg via ORAL
  Filled 2021-06-24 (×9): qty 1

## 2021-06-24 MED ORDER — LORAZEPAM 2 MG/ML IJ SOLN
0.0000 mg | Freq: Four times a day (QID) | INTRAMUSCULAR | Status: DC
  Administered 2021-06-25: 4 mg via INTRAVENOUS
  Administered 2021-06-26: 2 mg via INTRAVENOUS
  Administered 2021-06-26: 3 mg via INTRAVENOUS
  Administered 2021-06-26: 2 mg via INTRAVENOUS
  Filled 2021-06-24 (×2): qty 1
  Filled 2021-06-24 (×2): qty 2
  Filled 2021-06-24 (×2): qty 1
  Filled 2021-06-24: qty 2

## 2021-06-24 MED ORDER — LORAZEPAM 2 MG/ML IJ SOLN
1.0000 mg | INTRAMUSCULAR | Status: DC | PRN
  Administered 2021-06-26: 4 mg via INTRAVENOUS
  Administered 2021-06-26: 2 mg via INTRAVENOUS
  Administered 2021-06-26: 4 mg via INTRAVENOUS
  Administered 2021-06-26: 2 mg via INTRAVENOUS
  Administered 2021-06-26: 4 mg via INTRAVENOUS
  Administered 2021-06-27 (×2): 2 mg via INTRAVENOUS
  Administered 2021-06-27: 4 mg via INTRAVENOUS
  Filled 2021-06-24: qty 2
  Filled 2021-06-24: qty 1
  Filled 2021-06-24: qty 2
  Filled 2021-06-24: qty 1
  Filled 2021-06-24: qty 2
  Filled 2021-06-24: qty 1
  Filled 2021-06-24: qty 2

## 2021-06-24 MED ORDER — LORAZEPAM 1 MG PO TABS
1.0000 mg | ORAL_TABLET | ORAL | Status: DC | PRN
  Administered 2021-06-25: 1 mg via ORAL
  Filled 2021-06-24 (×2): qty 1

## 2021-06-24 MED ORDER — POTASSIUM CHLORIDE CRYS ER 20 MEQ PO TBCR
40.0000 meq | EXTENDED_RELEASE_TABLET | Freq: Once | ORAL | Status: AC
  Administered 2021-06-24: 40 meq via ORAL
  Filled 2021-06-24: qty 2

## 2021-06-24 MED ORDER — FOLIC ACID 1 MG PO TABS
1.0000 mg | ORAL_TABLET | Freq: Every day | ORAL | Status: DC
  Administered 2021-06-24 – 2021-07-05 (×8): 1 mg via ORAL
  Filled 2021-06-24 (×12): qty 1

## 2021-06-24 MED ORDER — LORAZEPAM 2 MG/ML IJ SOLN
0.0000 mg | Freq: Two times a day (BID) | INTRAMUSCULAR | Status: DC
  Administered 2021-06-26: 4 mg via INTRAVENOUS
  Filled 2021-06-24: qty 2
  Filled 2021-06-24: qty 1

## 2021-06-24 MED ORDER — POTASSIUM CHLORIDE CRYS ER 20 MEQ PO TBCR
40.0000 meq | EXTENDED_RELEASE_TABLET | Freq: Two times a day (BID) | ORAL | Status: DC
  Administered 2021-06-24 – 2021-07-05 (×7): 40 meq via ORAL
  Filled 2021-06-24 (×18): qty 2

## 2021-06-24 MED ORDER — ADULT MULTIVITAMIN W/MINERALS CH
1.0000 | ORAL_TABLET | Freq: Every day | ORAL | Status: DC
  Administered 2021-06-24 – 2021-07-05 (×7): 1 via ORAL
  Filled 2021-06-24 (×13): qty 1

## 2021-06-24 NOTE — Progress Notes (Signed)
   06/24/21 0015  Notify: Provider  Provider Name/Title Dr. Carren Rang  Date Provider Notified 06/24/21  Time Provider Notified [phone]  Notification Type  (amion)  Notification Reason Other (Comment) (11 beat run vtach)  Provider response No new orders  Date of Provider Response 06/24/21  Time of Provider Response [phone]

## 2021-06-24 NOTE — Plan of Care (Signed)

## 2021-06-24 NOTE — Progress Notes (Signed)
   06/24/21 0503  Notify: Provider  Provider Name/Title dr. Carren Rang  Date Provider Notified 06/24/21  Time Provider Notified [phone]  Notification Type  (amion)  Notification Reason Other (Comment) (9 beat run vtach at 0443 and 15 beat run vtach at 0505 awaiting MD response)  Provider response Other (Comment) (ordered another 1mg  dose of ativan)  Date of Provider Response 06/24/21  Time of Provider Response [phone]

## 2021-06-24 NOTE — Progress Notes (Signed)
PROGRESS NOTE    Daryl [name]  [id number] DOB: 1960-11-01 DOA: 06/23/2021 PCP: Patient, No Pcp Per (Inactive)    Brief Narrative:  Daryl [name] is a 60 y.o. male with medical history significant of nonischemic cardiomyopathy with ejection fraction of 20%, hypertension, presents to the emergency room with complaints of shortness of breath.  Reports that he has noticed worsening lower extremity over the past 3 to 4 days.  He is also developed some shortness of breath, but is now also having orthopnea.  He woke up this morning feeling short of breath.  Denies any chest pain.  He has not been taking his Lasix at home.  Says he does not have any Lasix.  He was evaluated in emergency room where chest x-ray showed CHF.  BNP elevated.  He was noted to be tachypneic, although still on room air.  He received intravenous Lasix with good urine output.  Still has evidence of volume overload.  Admission requested for further diuresis.   Assessment & Plan:   Active Problems:   Nonischemic cardiomyopathy (HCC)   Essential hypertension   Acute on chronic systolic CHF (congestive heart failure) (HCC)   Acute on chronic systolic congestive heart failure -Echocardiogram with EF of 20 to 25% -Suspect decompensation related to medication noncompliance -It appears that he has a poor understanding of his underlying disease process as well as his overall medication regimen -Restarted on Coreg, ARB -Continue intravenous diuretics for now -Since he is still orthopneic, will continue diuresis -He will need close follow-up with cardiology   Hypertension -Blood pressure improving now that he is back on Coreg and ARB.  Also suspect improvement with diuresis   Alcohol use -Reports that he is still drinking, but does not drink on a daily basis -Denies any prior history of withdrawal -Monitor on CIWA  NSVT -Having frequent episodes overnight on 10/30 -Magnesium and potassium low, and have been replaced  today -Continue on beta-blockers -Overall episodes of NSVT have improved today   DVT prophylaxis: enoxaparin (LOVENOX) injection 40 mg Start: 06/23/21 0945  Code Status: Full code Family Communication: Discussed with patient Disposition Plan: Status is: Inpatient  Remains inpatient appropriate because: Needs continued diuresis         Consultants:    Procedures:  Echo: EF of 20%  Antimicrobials:      Subjective: Still short of breath, orthopneic  Objective: Vitals:   06/23/21 2033 06/24/21 0500 06/24/21 0530 06/24/21 1318  BP: (!) 128/96  (!) 120/98 107/86  Pulse: 89  100 85  Resp: 18  20   Temp: 97.6 F (36.4 C)   (!) 97.5 F (36.4 C)  TempSrc:    Oral  SpO2: 99%  98% 100%  Weight:  76.4 kg    Height:        Intake/Output Summary (Last 24 hours) at 06/24/2021 1920 Last data filed at 06/24/2021 1825 Gross per 24 hour  Intake 2260 ml  Output 1400 ml  Net 860 ml   Filed Weights   06/23/21 0447 06/24/21 0500  Weight: 86.2 kg 76.4 kg    Examination:  General exam: Appears calm and comfortable  Respiratory system: Crackles at bases.  Increased respiratory effort Cardiovascular system: S1 & S2 heard, RRR. No JVD, murmurs, rubs, gallops or clicks. 1+ pedal edema. Gastrointestinal system: Abdomen is nondistended, soft and nontender. No organomegaly or masses felt. Normal bowel sounds heard. Central nervous system: Alert and oriented. No focal neurological deficits. Extremities: Symmetric 5 x 5 power. Skin:  No rashes, lesions or ulcers Psychiatry: Judgement and insight appear normal. Mood & affect appropriate.     Data Reviewed: I have personally reviewed following labs and imaging studies  CBC: Recent Labs  Lab 06/23/21 0630  WBC 8.4  NEUTROABS 5.2  HGB 15.8  HCT 50.3  MCV 89.3  PLT 322   Basic Metabolic Panel: Recent Labs  Lab 06/23/21 0630 06/23/21 2242 06/24/21 0508  NA 142  --  138  K 3.9 4.1 3.5  CL 108  --  101  CO2 25  --   29  GLUCOSE 124*  --  74  BUN 12  --  10  CREATININE 1.07  --  1.10  CALCIUM 8.8*  --  8.6*  MG  --  1.8 2.0   GFR: Estimated Creatinine Clearance: 72.3 mL/min (by C-G formula based on SCr of 1.1 mg/dL). Liver Function Tests: Recent Labs  Lab 06/23/21 0637  AST 36  ALT 35  ALKPHOS 114  BILITOT 1.3*  PROT 6.6  ALBUMIN 3.3*   Recent Labs  Lab 06/23/21 0637  LIPASE 43   No results for input(s): AMMONIA in the last 168 hours. Coagulation Profile: No results for input(s): INR, PROTIME in the last 168 hours. Cardiac Enzymes: No results for input(s): CKTOTAL, CKMB, CKMBINDEX, TROPONINI in the last 168 hours. BNP (last 3 results) No results for input(s): PROBNP in the last 8760 hours. HbA1C: No results for input(s): HGBA1C in the last 72 hours. CBG: No results for input(s): GLUCAP in the last 168 hours. Lipid Profile: No results for input(s): CHOL, HDL, LDLCALC, TRIG, CHOLHDL, LDLDIRECT in the last 72 hours. Thyroid Function Tests: No results for input(s): TSH, T4TOTAL, FREET4, T3FREE, THYROIDAB in the last 72 hours. Anemia Panel: No results for input(s): VITAMINB12, FOLATE, FERRITIN, TIBC, IRON, RETICCTPCT in the last 72 hours. Sepsis Labs: No results for input(s): PROCALCITON, LATICACIDVEN in the last 168 hours.  Recent Results (from the past 240 hour(s))  Resp Panel by RT-PCR (Flu A&B, Covid) Nasopharyngeal Swab     Status: None   Collection Time: 06/23/21  6:40 AM   Specimen: Nasopharyngeal Swab; Nasopharyngeal(NP) swabs in vial transport medium  Result Value Ref Range Status   SARS Coronavirus 2 by RT PCR NEGATIVE NEGATIVE Final    Comment: (NOTE) SARS-CoV-2 target nucleic acids are NOT DETECTED.  The SARS-CoV-2 RNA is generally detectable in upper respiratory specimens during the acute phase of infection. The lowest concentration of SARS-CoV-2 viral copies this assay can detect is 138 copies/mL. A negative result does not preclude SARS-Cov-2 infection and  should not be used as the sole basis for treatment or other patient management decisions. A negative result may occur with  improper specimen collection/handling, submission of specimen other than nasopharyngeal swab, presence of viral mutation(s) within the areas targeted by this assay, and inadequate number of viral copies(<138 copies/mL). A negative result must be combined with clinical observations, patient history, and epidemiological information. The expected result is Negative.  Fact Sheet for Patients:  BloggerCourse.com  Fact Sheet for Healthcare Providers:  SeriousBroker.it  This test is no t yet approved or cleared by the [place] FDA and  has been authorized for detection and/or diagnosis of SARS-CoV-2 by FDA under an Emergency Use Authorization (EUA). This EUA will remain  in effect (meaning this test can be used) for the duration of the COVID-19 declaration under Section 564(b)(1) of the Act, 21 U.S.C.section 360bbb-3(b)(1), unless the authorization is terminated  or revoked sooner.  Influenza A by PCR NEGATIVE NEGATIVE Final   Influenza B by PCR NEGATIVE NEGATIVE Final    Comment: (NOTE) The Xpert Xpress SARS-CoV-2/FLU/RSV plus assay is intended as an aid in the diagnosis of influenza from Nasopharyngeal swab specimens and should not be used as a sole basis for treatment. Nasal washings and aspirates are unacceptable for Xpert Xpress SARS-CoV-2/FLU/RSV testing.  Fact Sheet for Patients: EntrepreneurPulse.com.au  Fact Sheet for Healthcare Providers: IncredibleEmployment.be  This test is not yet approved or cleared by the [place] FDA and has been authorized for detection and/or diagnosis of SARS-CoV-2 by FDA under an Emergency Use Authorization (EUA). This EUA will remain in effect (meaning this test can be used) for the duration of the COVID-19 declaration  under Section 564(b)(1) of the Act, 21 U.S.C. section 360bbb-3(b)(1), unless the authorization is terminated or revoked.  Performed at [facility], [address], [place], [place] [address]          Radiology Studies: [facility] 1 View  Result Date: 06/23/2021 CLINICAL DATA:  Shortness of breath EXAM: PORTABLE CHEST 1 VIEW COMPARISON:  06/01/2021 FINDINGS: Interstitial opacity and small right pleural effusion. Chronic cardiomegaly. IMPRESSION: CHF. Electronically Signed   By: Jorje Guild M.D.   On: 06/23/2021 06:07   ECHOCARDIOGRAM COMPLETE  Result Date: 06/23/2021    ECHOCARDIOGRAM REPORT   Patient Name:   Daryl [name] Date of Exam: 06/23/2021 Medical Rec #:  [id number]    Height:       69.0 in Accession #:    [id number]   Weight:       190.0 lb Date of Birth:  02/22/1961    BSA:          2.021 m Patient Age:    34 years     BP:           173/142 mmHg Patient Gender: M            HR:           104 bpm. Exam Location:  [facility] Procedure: 2D Echo, Cardiac Doppler and Color Doppler Indications:    CHF- Acute Systolic [id number]  History:        Patient has prior history of Echocardiogram examinations, most                 recent 09/13/2018. Cardiomyopathy, CAD; Risk                 Factors:Hypertension. Tobacco & ETOH abuse.  Sonographer:    Alvino Chapel RCS Referring Phys: [place]  1. Left ventricular ejection fraction, by estimation, is 20 to 25%. The left ventricle has severely decreased function. The left ventricle demonstrates global hypokinesis. The left ventricular internal cavity size was moderately dilated. Left ventricular diastolic parameters are indeterminate.  2. Right ventricular systolic function is mildly reduced. The right ventricular size is mildly enlarged. There is severely elevated pulmonary artery systolic pressure.  3. Left atrial size was severely dilated.  4. Right atrial size was severely dilated.  5. The mitral valve is normal in structure.  Severe mitral valve regurgitation. No evidence of mitral stenosis.  6. Tricuspid valve regurgitation is moderate to severe.  7. The aortic valve is tricuspid. Aortic valve regurgitation is not visualized. Mild aortic valve sclerosis is present, with no evidence of aortic valve stenosis.  8. The inferior vena cava is dilated in size with <50% respiratory variability, suggesting right atrial pressure of 15 mmHg. FINDINGS  Left  Ventricle: Left ventricular ejection fraction, by estimation, is 20 to 25%. The left ventricle has severely decreased function. The left ventricle demonstrates global hypokinesis. The left ventricular internal cavity size was moderately dilated. There is no left ventricular hypertrophy. Left ventricular diastolic parameters are indeterminate. Right Ventricle: The right ventricular size is mildly enlarged. Right ventricular systolic function is mildly reduced. There is severely elevated pulmonary artery systolic pressure. The tricuspid regurgitant velocity is 3.79 m/s, and with an assumed right atrial pressure of 15 mmHg, the estimated right ventricular systolic pressure is [id number] mmHg. Left Atrium: Left atrial size was severely dilated. Right Atrium: Right atrial size was severely dilated. Pericardium: There is no evidence of pericardial effusion. Mitral Valve: The mitral valve is normal in structure. Severe mitral valve regurgitation. No evidence of mitral valve stenosis. Tricuspid Valve: The tricuspid valve is normal in structure. Tricuspid valve regurgitation is moderate to severe. No evidence of tricuspid stenosis. Aortic Valve: The aortic valve is tricuspid. Aortic valve regurgitation is not visualized. Mild aortic valve sclerosis is present, with no evidence of aortic valve stenosis. Pulmonic Valve: The pulmonic valve was normal in structure. Pulmonic valve regurgitation is not visualized. No evidence of pulmonic stenosis. Aorta: The aortic root is normal in size and structure. Venous: The  inferior vena cava is dilated in size with less than 50% respiratory variability, suggesting right atrial pressure of 15 mmHg. IAS/Shunts: No atrial level shunt detected by color flow Doppler.  LEFT VENTRICLE PLAX 2D LVIDd:         5.90 cm      Diastology LVIDs:         5.40 cm      LV e' lateral:   7.72 cm/s LV PW:         1.10 cm      LV E/e' lateral: 17.2 LV IVS:        0.90 cm LVOT diam:     1.90 cm LV SV:         36 LV SV Index:   18 LVOT Area:     2.84 cm  LV Volumes (MOD) LV vol d, MOD A2C: 127.0 ml LV vol d, MOD A4C: 145.0 ml LV vol s, MOD A2C: 112.0 ml LV vol s, MOD A4C: 102.0 ml LV SV MOD A2C:     15.0 ml LV SV MOD A4C:     145.0 ml LV SV MOD BP:      30.7 ml RIGHT VENTRICLE RV S prime:     12.40 cm/s TAPSE (M-mode): 2.8 cm LEFT ATRIUM              Index        RIGHT ATRIUM           Index LA diam:        5.40 cm  2.67 cm/m   RA Area:     30.00 cm LA Vol (A2C):   100.0 ml 49.47 ml/m  RA Volume:   115.00 ml 56.89 ml/m LA Vol (A4C):   128.0 ml 63.32 ml/m LA Biplane Vol: 116.0 ml 57.38 ml/m  AORTIC VALVE LVOT Vmax:   79.00 cm/s LVOT Vmean:  55.800 cm/s LVOT VTI:    0.127 m  AORTA Ao Root diam: 2.80 cm MITRAL VALVE                  TRICUSPID VALVE MV Area (PHT): 6.07 cm       TR Peak grad:   57.5 mmHg MV Decel Time:  125 msec       TR Vmax:        379.00 cm/s MR Peak grad:    97.2 mmHg MR Mean grad:    60.0 mmHg    SHUNTS MR Vmax:         493.00 cm/s  Systemic VTI:  0.13 m MR Vmean:        350.0 cm/s   Systemic Diam: 1.90 cm MR PISA:         1.57 cm MR PISA Eff ROA: 8 mm MR PISA Radius:  0.50 cm MV E velocity: 133.00 cm/s MV A velocity: 47.10 cm/s MV E/A ratio:  2.82 Kirk Ruths MD Electronically signed by Kirk Ruths MD Signature Date/Time: 06/23/2021/11:41:05 AM    Final         Scheduled Meds:  atorvastatin  40 mg Oral Daily   carvedilol  6.25 mg Oral BID WC   enoxaparin (LOVENOX) injection  40 mg Subcutaneous [id number]   folic acid  1 mg Oral Daily   furosemide  20 mg Intravenous BID    irbesartan  37.5 mg Oral BID   LORazepam  0-4 mg Intravenous Q6H   Followed by   Derrill Memo ON 06/26/2021] LORazepam  0-4 mg Intravenous Q12H   multivitamin with minerals  1 tablet Oral Daily   potassium chloride  40 mEq Oral BID   thiamine  100 mg Oral Daily   Or   thiamine  100 mg Intravenous Daily   Continuous Infusions:   LOS: 0 days    Time spent: 40mins    Kathie Dike, MD [facility]   If 7PM-7AM, please contact night-coverage [email or web address]  06/24/2021, 7:20 PM

## 2021-06-24 NOTE — Progress Notes (Signed)
Patient had 8 different runs of vtach this shift patient remains asymptomatic. MD is aware, patient received x2 doses of ativan and x1 1g Mg run.

## 2021-06-25 DIAGNOSIS — I428 Other cardiomyopathies: Secondary | ICD-10-CM | POA: Diagnosis not present

## 2021-06-25 DIAGNOSIS — I5023 Acute on chronic systolic (congestive) heart failure: Secondary | ICD-10-CM | POA: Diagnosis not present

## 2021-06-25 DIAGNOSIS — I10 Essential (primary) hypertension: Secondary | ICD-10-CM | POA: Diagnosis not present

## 2021-06-25 LAB — BASIC METABOLIC PANEL
Anion gap: 7 (ref 5–15)
BUN: 18 mg/dL (ref 6–20)
CO2: 27 mmol/L (ref 22–32)
Calcium: 8.7 mg/dL — ABNORMAL LOW (ref 8.9–10.3)
Chloride: 104 mmol/L (ref 98–111)
Creatinine, Ser: 1.33 mg/dL — ABNORMAL HIGH (ref 0.61–1.24)
GFR, Estimated: >60 mL/min (ref >60)
Glucose, Bld: 111 mg/dL — ABNORMAL HIGH (ref 70–99)
Potassium: 4.3 mmol/L (ref 3.5–5.1)
Sodium: 138 mmol/L (ref 135–145)

## 2021-06-25 MED ORDER — METOPROLOL SUCCINATE ER 50 MG PO TB24: 50.0000 mg | ORAL_TABLET | Freq: Every day | ORAL | Status: DC

## 2021-06-25 MED ORDER — METOPROLOL SUCCINATE ER 50 MG PO TB24
50.0000 mg | ORAL_TABLET | Freq: Every day | ORAL | Status: DC
  Administered 2021-06-26 – 2021-07-04 (×5): 50 mg via ORAL
  Filled 2021-06-25 (×12): qty 1

## 2021-06-25 MED ORDER — IRBESARTAN 75 MG PO TABS
37.5000 mg | ORAL_TABLET | Freq: Every day | ORAL | Status: DC
  Administered 2021-06-26 – 2021-07-05 (×7): 37.5 mg via ORAL
  Filled 2021-06-25 (×12): qty 1

## 2021-06-25 MED ORDER — METOPROLOL SUCCINATE ER 50 MG PO TB24: 50.0000 mg | ORAL_TABLET | Freq: Once | ORAL | Status: DC

## 2021-06-25 MED ORDER — SPIRONOLACTONE 25 MG PO TABS
12.5000 mg | ORAL_TABLET | Freq: Every day | ORAL | Status: DC
  Administered 2021-06-25 – 2021-07-05 (×7): 12.5 mg via ORAL
  Filled 2021-06-25 (×2): qty 0.5
  Filled 2021-06-25 (×3): qty 1
  Filled 2021-06-25: qty 0.5
  Filled 2021-06-25 (×6): qty 1
  Filled 2021-06-25 (×3): qty 0.5
  Filled 2021-06-25: qty 1
  Filled 2021-06-25 (×4): qty 0.5
  Filled 2021-06-25: qty 1
  Filled 2021-06-25 (×4): qty 0.5
  Filled 2021-06-25 (×2): qty 1

## 2021-06-25 NOTE — Progress Notes (Addendum)
PROGRESS NOTE    WAKE ACRE  [id number] DOB: [birth year] DOA: 06/23/2021 PCP: Patient, No Pcp Per (Inactive)    Brief Narrative:  Daryl [name] is a 60 y.o. male with medical history significant of nonischemic cardiomyopathy with ejection fraction of 20%, hypertension, presents to the emergency room with complaints of shortness of breath.  Reports that he has noticed worsening lower extremity over the past 3 to 4 days.  He is also developed some shortness of breath, but is now also having orthopnea.  He woke up this morning feeling short of breath.  Denies any chest pain.  He has not been taking his Lasix at home.  Says he does not have any Lasix.  He was evaluated in emergency room where chest x-ray showed CHF.  BNP elevated.  He was noted to be tachypneic, although still on room air.  He received intravenous Lasix with good urine output.  Still has evidence of volume overload.  Admission requested for further diuresis.   Assessment & Plan:   Active Problems:   Nonischemic cardiomyopathy (HCC)   Essential hypertension   Acute on chronic systolic CHF (congestive heart failure) (HCC)   Acute on chronic systolic congestive heart failure -Echocardiogram with EF of 20 to 25% -Suspect decompensation related to medication noncompliance -It appears that he has a poor understanding of his underlying disease process as well as his overall medication regimen -Restarted on BB, ARB -Continue intravenous diuretics for now -Since he is still orthopneic, will continue diuresis -encouraged him to urinate in urinal so that urine output can be measured -discussed with Dr. Domenic Polite, and will add aldactone to regimen -change coreg to toprol XL -He will need close follow-up with cardiology -will refer to advanced heart failure clinic on discharge   Hypertension -Blood pressure stable now that he is back on BB and ARB.  Also suspect improvement with diuresis   Alcohol use -Reports that he is still  drinking, but does not drink on a daily basis -Denies any prior history of withdrawal -Monitor on CIWA  NSVT -Having frequent episodes overnight on 10/30 -electrolytes replaced -Discussed with Dr. Domenic Polite and will change coreg to Toprol -no indication for defibrillator eval since his EF could potentially improve with medication compliance -will need cardiology follow up.   DVT prophylaxis: enoxaparin (LOVENOX) injection 40 mg Start: 06/23/21 0945  Code Status: Full code Family Communication: Discussed with patient Disposition Plan: Status is: Inpatient  Remains inpatient appropriate because: Needs continued diuresis     Consultants:    Procedures:  Echo: EF of 20%  Antimicrobials:      Subjective: Reports continued shortness of breath and orthopnea. No chest pain  Objective: Vitals:   06/24/21 2358 06/25/21 0500 06/25/21 0558 06/25/21 0800  BP: (!) 135/102  111/87 121/81  Pulse: 94  97 88  Resp: 20  20   Temp: 97.6 F (36.4 C)  98.1 F (36.7 C)   TempSrc: Oral  Oral   SpO2: 99%  97%   Weight:  76.3 kg    Height:        Intake/Output Summary (Last 24 hours) at 06/25/2021 1155 Last data filed at 06/25/2021 0900 Gross per 24 hour  Intake 1200 ml  Output --  Net 1200 ml   Filed Weights   06/23/21 0447 06/24/21 0500 06/25/21 0500  Weight: 86.2 kg 76.4 kg 76.3 kg    Examination:  General exam: Alert, awake, oriented x 3 Respiratory system: crackles at bases. Respiratory effort normal. Cardiovascular  system:RRR. No murmurs, rubs, gallops. Gastrointestinal system: Abdomen is nondistended, soft and nontender. No organomegaly or masses felt. Normal bowel sounds heard. Central nervous system: Alert and oriented. No focal neurological deficits. Extremities: 1+ bilateral pedal edema Skin: No rashes, lesions or ulcers Psychiatry: Judgement and insight appear normal. Mood & affect appropriate.      Data Reviewed: I have personally reviewed following labs  and imaging studies  CBC: Recent Labs  Lab 06/23/21 0630  WBC 8.4  NEUTROABS 5.2  HGB 15.8  HCT 50.3  MCV 89.3  PLT 322   Basic Metabolic Panel: Recent Labs  Lab 06/23/21 0630 06/23/21 2242 06/24/21 0508  NA 142  --  138  K 3.9 4.1 3.5  CL 108  --  101  CO2 25  --  29  GLUCOSE 124*  --  74  BUN 12  --  10  CREATININE 1.07  --  1.10  CALCIUM 8.8*  --  8.6*  MG  --  1.8 2.0   GFR: Estimated Creatinine Clearance: 72.3 mL/min (by C-G formula based on SCr of 1.1 mg/dL). Liver Function Tests: Recent Labs  Lab 06/23/21 0637  AST 36  ALT 35  ALKPHOS 114  BILITOT 1.3*  PROT 6.6  ALBUMIN 3.3*   Recent Labs  Lab 06/23/21 0637  LIPASE 43   No results for input(s): AMMONIA in the last 168 hours. Coagulation Profile: No results for input(s): INR, PROTIME in the last 168 hours. Cardiac Enzymes: No results for input(s): CKTOTAL, CKMB, CKMBINDEX, TROPONINI in the last 168 hours. BNP (last 3 results) No results for input(s): PROBNP in the last 8760 hours. HbA1C: No results for input(s): HGBA1C in the last 72 hours. CBG: No results for input(s): GLUCAP in the last 168 hours. Lipid Profile: No results for input(s): CHOL, HDL, LDLCALC, TRIG, CHOLHDL, LDLDIRECT in the last 72 hours. Thyroid Function Tests: No results for input(s): TSH, T4TOTAL, FREET4, T3FREE, THYROIDAB in the last 72 hours. Anemia Panel: No results for input(s): VITAMINB12, FOLATE, FERRITIN, TIBC, IRON, RETICCTPCT in the last 72 hours. Sepsis Labs: No results for input(s): PROCALCITON, LATICACIDVEN in the last 168 hours.  Recent Results (from the past 240 hour(s))  Resp Panel by RT-PCR (Flu A&B, Covid) Nasopharyngeal Swab     Status: None   Collection Time: 06/23/21  6:40 AM   Specimen: Nasopharyngeal Swab; Nasopharyngeal(NP) swabs in vial transport medium  Result Value Ref Range Status   SARS Coronavirus 2 by RT PCR NEGATIVE NEGATIVE Final    Comment: (NOTE) SARS-CoV-2 target nucleic acids are  NOT DETECTED.  The SARS-CoV-2 RNA is generally detectable in upper respiratory specimens during the acute phase of infection. The lowest concentration of SARS-CoV-2 viral copies this assay can detect is 138 copies/mL. A negative result does not preclude SARS-Cov-2 infection and should not be used as the sole basis for treatment or other patient management decisions. A negative result may occur with  improper specimen collection/handling, submission of specimen other than nasopharyngeal swab, presence of viral mutation(s) within the areas targeted by this assay, and inadequate number of viral copies(<138 copies/mL). A negative result must be combined with clinical observations, patient history, and epidemiological information. The expected result is Negative.  Fact Sheet for Patients:  BloggerCourse.com  Fact Sheet for Healthcare Providers:  SeriousBroker.it  This test is no t yet approved or cleared by the [place] FDA and  has been authorized for detection and/or diagnosis of SARS-CoV-2 by FDA under an Emergency Use Authorization (EUA). This EUA  will remain  in effect (meaning this test can be used) for the duration of the COVID-19 declaration under Section 564(b)(1) of the Act, 21 U.S.C.section 360bbb-3(b)(1), unless the authorization is terminated  or revoked sooner.       Influenza A by PCR NEGATIVE NEGATIVE Final   Influenza B by PCR NEGATIVE NEGATIVE Final    Comment: (NOTE) The Xpert Xpress SARS-CoV-2/FLU/RSV plus assay is intended as an aid in the diagnosis of influenza from Nasopharyngeal swab specimens and should not be used as a sole basis for treatment. Nasal washings and aspirates are unacceptable for Xpert Xpress SARS-CoV-2/FLU/RSV testing.  Fact Sheet for Patients: EntrepreneurPulse.com.au  Fact Sheet for Healthcare Providers: IncredibleEmployment.be  This test is not  yet approved or cleared by the [place] FDA and has been authorized for detection and/or diagnosis of SARS-CoV-2 by FDA under an Emergency Use Authorization (EUA). This EUA will remain in effect (meaning this test can be used) for the duration of the COVID-19 declaration under Section 564(b)(1) of the Act, 21 U.S.C. section 360bbb-3(b)(1), unless the authorization is terminated or revoked.  Performed at [facility], [address], [place], [place] [address]          Radiology Studies: No results found.      Scheduled Meds:  atorvastatin  40 mg Oral Daily   enoxaparin (LOVENOX) injection  40 mg Subcutaneous [id number]   folic acid  1 mg Oral Daily   furosemide  20 mg Intravenous BID   irbesartan  37.5 mg Oral BID   LORazepam  0-4 mg Intravenous Q6H   Followed by   Derrill Memo ON 06/26/2021] LORazepam  0-4 mg Intravenous Q12H   [START ON 06/26/2021] metoprolol succinate  50 mg Oral Daily   metoprolol succinate  50 mg Oral Once   multivitamin with minerals  1 tablet Oral Daily   potassium chloride  40 mEq Oral BID   spironolactone  12.5 mg Oral Daily   thiamine  100 mg Oral Daily   Or   thiamine  100 mg Intravenous Daily   Continuous Infusions:   LOS: 1 day    Time spent: 86mins    Kathie Dike, MD [facility]   If 7PM-7AM, please contact night-coverage [email or web address]  06/25/2021, 11:55 AM

## 2021-06-26 DIAGNOSIS — F10931 Alcohol use, unspecified with withdrawal delirium: Secondary | ICD-10-CM | POA: Clinically undetermined

## 2021-06-26 DIAGNOSIS — I10 Essential (primary) hypertension: Secondary | ICD-10-CM | POA: Diagnosis not present

## 2021-06-26 DIAGNOSIS — I5023 Acute on chronic systolic (congestive) heart failure: Secondary | ICD-10-CM | POA: Diagnosis not present

## 2021-06-26 DIAGNOSIS — I428 Other cardiomyopathies: Secondary | ICD-10-CM | POA: Diagnosis not present

## 2021-06-26 LAB — MAGNESIUM: Magnesium: 2 mg/dL (ref 1.7–2.4)

## 2021-06-26 LAB — BASIC METABOLIC PANEL
Anion gap: 9 (ref 5–15)
BUN: 15 mg/dL (ref 6–20)
CO2: 25 mmol/L (ref 22–32)
Calcium: 8.8 mg/dL — ABNORMAL LOW (ref 8.9–10.3)
Chloride: 105 mmol/L (ref 98–111)
Creatinine, Ser: 1.1 mg/dL (ref 0.61–1.24)
GFR, Estimated: >60 mL/min (ref >60)
Glucose, Bld: 95 mg/dL (ref 70–99)
Potassium: 4.1 mmol/L (ref 3.5–5.1)
Sodium: 139 mmol/L (ref 135–145)

## 2021-06-26 LAB — CBC
HCT: 48.6 % (ref 39.0–52.0)
Hemoglobin: 15.5 g/dL (ref 13.0–17.0)
MCH: 27.6 pg (ref 26.0–34.0)
MCHC: 31.9 g/dL (ref 30.0–36.0)
MCV: 86.6 fL (ref 80.0–100.0)
Platelets: 326 10*3/uL (ref 150–400)
RBC: 5.61 MIL/uL (ref 4.22–5.81)
RDW: 14.6 % (ref 11.5–15.5)
WBC: 6.5 10*3/uL (ref 4.0–10.5)
nRBC: 0 % (ref 0.0–0.2)

## 2021-06-26 MED ORDER — HALOPERIDOL LACTATE 5 MG/ML IJ SOLN
5.0000 mg | Freq: Four times a day (QID) | INTRAMUSCULAR | Status: DC | PRN
  Administered 2021-06-26 – 2021-07-04 (×16): 5 mg via INTRAMUSCULAR
  Filled 2021-06-26 (×18): qty 1

## 2021-06-26 NOTE — Progress Notes (Signed)
Pt refused PRN ativan

## 2021-06-26 NOTE — Progress Notes (Addendum)
06/26/2021 0002:  NT reported to RN that pt was combative while attempting to ambulate. Pt educated on risk for falls. No evidence of learning as pt has attempted to self-ambulate without use of call light. Charge RN alerted of pt behavior. NT sitter assigned to pt. Bed alarm activated. RN will increase rounding. PRN ativan given d/t CIWA score of 12.   07/26/2021 0105:  Pt attempted to self ambulate despite education given on risk for falls. Bed alarm activated and NT sitter at bedside.

## 2021-06-26 NOTE — Progress Notes (Signed)
Attempted to call patient contact (patient mother as contact) and received no answer at time.

## 2021-06-26 NOTE — Progress Notes (Signed)
Re-assessment after PRN ativan given

## 2021-06-26 NOTE — Progress Notes (Signed)
Pt is combative and refusing vitals

## 2021-06-26 NOTE — Progress Notes (Addendum)
06/26/2021 2204:  Pt attempting to self ambulate despite education on safety from falls and proper use of call bell. Pt aggressive with staff. MD on call notified. Orders reviewed and will be initiated when necessary. Safety sitter at bedside. Increased rounding by staff implemented.

## 2021-06-26 NOTE — Progress Notes (Addendum)
Patient getting out of bed without assistance.  Staff went in to room to help patient.  Patient hit staff, saying "Keep your damn hands off of me."   Patient confused and difficult to reorient. Explained again to patient that he was a high fall risk.  Patient has order for prn medications and a sitter at bedside. Patient again swung at and hit nursing staff.  Md notified. Orders given and received.

## 2021-06-26 NOTE — Progress Notes (Signed)
PROGRESS NOTE    Daryl [name]  [id number] DOB: Sep 07, 1960 DOA: 06/23/2021 PCP: Patient, No Pcp Per (Inactive)    Brief Narrative:  Daryl [name] is a 60 y.o. male with medical history significant of nonischemic cardiomyopathy with ejection fraction of 20%, hypertension, presents to the emergency room with complaints of shortness of breath.  Reports that he has noticed worsening lower extremity over the past 3 to 4 days.  He is also developed some shortness of breath, but is now also having orthopnea.  He woke up this morning feeling short of breath.  Denies any chest pain.  He has not been taking his Lasix at home.  Says he does not have any Lasix.  He was evaluated in emergency room where chest x-ray showed CHF.  BNP elevated.  He was noted to be tachypneic, although still on room air.  He received intravenous Lasix with good urine output.  Still has evidence of volume overload.  Admission requested for further diuresis.   Assessment & Plan:   Active Problems:   Nonischemic cardiomyopathy (HCC)   Essential hypertension   Acute on chronic systolic CHF (congestive heart failure) (HCC)   A/p 1)HFrEF/Acute on chronic systolic congestive heart failure -Echocardiogram with EF of 20 to 25% -Suspect decompensation related to medication noncompliance -It appears that he has a poor understanding of his underlying disease process as well as his overall medication regimen -Restarted on BB, ARB (currently on Avapro, Toprol-XL) -Dyspnea persist so Continue Lasix and Aldactone with potassium supplementation -Patient still refusing or unable to cooperate with voiding into urinal so urine output is inadequate -Dr. Roderic Palau previously discussed with Dr. Domenic Polite, -Needs to follow-up with general cardiology and advanced heart failure clinic on discharge   2)Hypertension -See cardiovascular medications as above #1   3)Alcohol Abuse with DTs -Patient with significant alcohol withdrawal symptoms  requiring frequent doses of IV lorazepam -Give lorazepam per CIWA protocol, thiamine and folic acid as ordered  4)NSVT -Having episodes of NSVT on telemetry -electrolytes replaced -Dr. Roderic Palau previously discussed with Dr. Domenic Polite   -no indication for defibrillator eval since his EF could potentially improve with medication compliance -will need cardiology follow up post discharge   DVT prophylaxis: enoxaparin (LOVENOX) injection 40 mg Start: 06/23/21 0945  Code Status: Full code Family Communication: Attempted to reach patient's mother at [phone]--no answer Disposition Plan: Status is: Inpatient  Remains inpatient appropriate because: Needs continued diuresis and IV lorazepam for DTs  Consultants:  Curbside consultation with cardiology Procedures:  Echo: EF of 20%  Antimicrobials:      Subjective: -Restless, agitated, uncooperative, requiring IV lorazepam and IM Haldol  Objective: Vitals:   06/26/21 0701 06/26/21 0805 06/26/21 1340 06/26/21 1343  BP: 100/86  (!) 88/70 (!) 105/93  Pulse: (!) 34 88 68 73  Resp: 16     Temp:      TempSrc:      SpO2: 100% 96% 98%   Weight:      Height:        Intake/Output Summary (Last 24 hours) at 06/26/2021 1601 Last data filed at 06/26/2021 1100 Gross per 24 hour  Intake 600 ml  Output 400 ml  Net 200 ml   Filed Weights   06/24/21 0500 06/25/21 0500 06/26/21 0325  Weight: 76.4 kg 76.3 kg 74.2 kg   Physical Exam  Gen:- Awake Alert, restless and agitated from time to time,  HEENT:- [place].AT, No sclera icterus Neck-Supple Neck,No JVD,.  Lungs-  CTAB , fair air movement  bilaterally  CV- S1, S2 normal, RRR Abd-  +ve B.Sounds, Abd Soft, No tenderness,    Extremity/Skin:- No  edema,   good pedal pulses  Psych-affect is anxious, restless, agitated, very poor judgment and very poor insight neuro-generalized weakness, no new focal deficits, +ve tremors .  Data Reviewed: I have personally reviewed following labs and imaging  studies  CBC: Recent Labs  Lab 06/23/21 0630 06/26/21 0524  WBC 8.4 6.5  NEUTROABS 5.2  --   HGB 15.8 15.5  HCT 50.3 48.6  MCV 89.3 86.6  PLT 322 [id number]   Basic Metabolic Panel: Recent Labs  Lab 06/23/21 0630 06/23/21 2242 06/24/21 0508 06/25/21 1359 06/26/21 0524  NA 142  --  138 138 139  K 3.9 4.1 3.5 4.3 4.1  CL 108  --  101 104 105  CO2 25  --  29 27 25   GLUCOSE 124*  --  74 111* 95  BUN 12  --  10 18 15   CREATININE 1.07  --  1.10 1.33* 1.10  CALCIUM 8.8*  --  8.6* 8.7* 8.8*  MG  --  1.8 2.0  --  2.0   GFR: Estimated Creatinine Clearance: 72.3 mL/min (by C-G formula based on SCr of 1.1 mg/dL). Liver Function Tests: Recent Labs  Lab 06/23/21 0637  AST 36  ALT 35  ALKPHOS 114  BILITOT 1.3*  PROT 6.6  ALBUMIN 3.3*   Recent Labs  Lab 06/23/21 0637  LIPASE 43   No results for input(s): AMMONIA in the last 168 hours. Coagulation Profile: No results for input(s): INR, PROTIME in the last 168 hours. Cardiac Enzymes: No results for input(s): CKTOTAL, CKMB, CKMBINDEX, TROPONINI in the last 168 hours. BNP (last 3 results) No results for input(s): PROBNP in the last 8760 hours. HbA1C: No results for input(s): HGBA1C in the last 72 hours. CBG: No results for input(s): GLUCAP in the last 168 hours. Lipid Profile: No results for input(s): CHOL, HDL, LDLCALC, TRIG, CHOLHDL, LDLDIRECT in the last 72 hours. Thyroid Function Tests: No results for input(s): TSH, T4TOTAL, FREET4, T3FREE, THYROIDAB in the last 72 hours. Anemia Panel: No results for input(s): VITAMINB12, FOLATE, FERRITIN, TIBC, IRON, RETICCTPCT in the last 72 hours. Sepsis Labs: No results for input(s): PROCALCITON, LATICACIDVEN in the last 168 hours.  Recent Results (from the past 240 hour(s))  Resp Panel by RT-PCR (Flu A&B, Covid) Nasopharyngeal Swab     Status: None   Collection Time: 06/23/21  6:40 AM   Specimen: Nasopharyngeal Swab; Nasopharyngeal(NP) swabs in vial transport medium  Result  Value Ref Range Status   SARS Coronavirus 2 by RT PCR NEGATIVE NEGATIVE Final    Comment: (NOTE) SARS-CoV-2 target nucleic acids are NOT DETECTED.  The SARS-CoV-2 RNA is generally detectable in upper respiratory specimens during the acute phase of infection. The lowest concentration of SARS-CoV-2 viral copies this assay can detect is 138 copies/mL. A negative result does not preclude SARS-Cov-2 infection and should not be used as the sole basis for treatment or other patient management decisions. A negative result may occur with  improper specimen collection/handling, submission of specimen other than nasopharyngeal swab, presence of viral mutation(s) within the areas targeted by this assay, and inadequate number of viral copies(<138 copies/mL). A negative result must be combined with clinical observations, patient history, and epidemiological information. The expected result is Negative.  Fact Sheet for Patients:  EntrepreneurPulse.com.au  Fact Sheet for Healthcare Providers:  IncredibleEmployment.be  This test is no t yet approved or cleared  by the [place] and  has been authorized for detection and/or diagnosis of SARS-CoV-2 by FDA under an Emergency Use Authorization (EUA). This EUA will remain  in effect (meaning this test can be used) for the duration of the COVID-19 declaration under Section 564(b)(1) of the Act, 21 U.S.C.section 360bbb-3(b)(1), unless the authorization is terminated  or revoked sooner.       Influenza A by PCR NEGATIVE NEGATIVE Final   Influenza B by PCR NEGATIVE NEGATIVE Final    Comment: (NOTE) The Xpert Xpress SARS-CoV-2/FLU/RSV plus assay is intended as an aid in the diagnosis of influenza from Nasopharyngeal swab specimens and should not be used as a sole basis for treatment. Nasal washings and aspirates are unacceptable for Xpert Xpress SARS-CoV-2/FLU/RSV testing.  Fact Sheet for  Patients: BloggerCourse.com  Fact Sheet for Healthcare Providers: SeriousBroker.it  This test is not yet approved or cleared by the [place] FDA and has been authorized for detection and/or diagnosis of SARS-CoV-2 by FDA under an Emergency Use Authorization (EUA). This EUA will remain in effect (meaning this test can be used) for the duration of the COVID-19 declaration under Section 564(b)(1) of the Act, 21 U.S.C. section 360bbb-3(b)(1), unless the authorization is terminated or revoked.  Performed at [facility], [address] [place], [place] [address]      Radiology Studies: No results found.  Scheduled Meds:  atorvastatin  40 mg Oral Daily   enoxaparin (LOVENOX) injection  40 mg Subcutaneous Q24H   folic acid  1 mg Oral Daily   furosemide  20 mg Intravenous BID   irbesartan  37.5 mg Oral Daily   LORazepam  0-4 mg Intravenous Q6H   Followed by   LORazepam  0-4 mg Intravenous Q12H   metoprolol succinate  50 mg Oral Daily   multivitamin with minerals  1 tablet Oral Daily   potassium chloride  40 mEq Oral BID   spironolactone  12.5 mg Oral Daily   thiamine  100 mg Oral Daily   Or   thiamine  100 mg Intravenous Daily   Continuous Infusions:   LOS: 2 days   Shon Hale, MD [facility]   If 7PM-7AM, please contact night-coverage [email or web address]  06/26/2021, 4:01 PM

## 2021-06-26 NOTE — Progress Notes (Signed)
06/26/2021 0534:  Pt is refusing lab draws, vitals, and all medications. Pt is swinging at staff when attempting to provide pt care.

## 2021-06-26 NOTE — Progress Notes (Signed)
06/26/2021 0615:  Pt self-ambulating despite education on risk for falls and safety sitter at bedside. When attempting to assist pt to bathroom, pt is combative and swinging stating, "get the fuck out of my room." RN explains to pt that he is in the hospital and here to help. Pt re-educated on safety from falls. Pt states, "if I needed your help, I would ask for it." A second RN is present during this interaction. Pt uses bathroom on his own and ambulates to bed. Gait is unsteady. Pt is now in bed and safety sitter at bedside.

## 2021-06-27 DIAGNOSIS — I10 Essential (primary) hypertension: Secondary | ICD-10-CM | POA: Diagnosis not present

## 2021-06-27 DIAGNOSIS — F10931 Alcohol use, unspecified with withdrawal delirium: Secondary | ICD-10-CM

## 2021-06-27 DIAGNOSIS — I428 Other cardiomyopathies: Secondary | ICD-10-CM | POA: Diagnosis not present

## 2021-06-27 DIAGNOSIS — I5023 Acute on chronic systolic (congestive) heart failure: Secondary | ICD-10-CM | POA: Diagnosis not present

## 2021-06-27 LAB — GLUCOSE, CAPILLARY: Glucose-Capillary: 88 mg/dL (ref 70–99)

## 2021-06-27 MED ORDER — LORAZEPAM 1 MG PO TABS: 1.0000 mg | ORAL_TABLET | ORAL | Status: DC | PRN

## 2021-06-27 MED ORDER — LORAZEPAM 2 MG/ML IJ SOLN
1.0000 mg | INTRAMUSCULAR | Status: DC | PRN
  Administered 2021-06-27 – 2021-06-28 (×3): 2 mg via INTRAVENOUS
  Administered 2021-06-28: 4 mg via INTRAVENOUS
  Administered 2021-06-28: 3 mg via INTRAVENOUS
  Administered 2021-06-28: 2 mg via INTRAVENOUS
  Administered 2021-06-28: 4 mg via INTRAVENOUS
  Administered 2021-06-29: 2 mg via INTRAVENOUS
  Administered 2021-06-29: 4 mg via INTRAVENOUS
  Administered 2021-06-29: 2 mg via INTRAVENOUS
  Filled 2021-06-27 (×2): qty 1
  Filled 2021-06-27 (×2): qty 2
  Filled 2021-06-27 (×2): qty 1
  Filled 2021-06-27 (×3): qty 2
  Filled 2021-06-27: qty 1

## 2021-06-27 MED ORDER — CHLORDIAZEPOXIDE HCL 5 MG PO CAPS
5.0000 mg | ORAL_CAPSULE | Freq: Three times a day (TID) | ORAL | Status: DC
  Administered 2021-06-28 – 2021-07-02 (×6): 5 mg via ORAL
  Filled 2021-06-27 (×12): qty 1

## 2021-06-27 MED ORDER — DEXTROSE-NACL 5-0.45 % IV SOLN: INTRAVENOUS | Status: DC

## 2021-06-27 NOTE — Progress Notes (Signed)
06/27/2021 1683:  This RN attempted to clean pt's face. Pt stated, "get out of my damn face."

## 2021-06-27 NOTE — Progress Notes (Signed)
PROGRESS NOTE    Daryl [name]  YQI:[id number] DOB: 1960-10-13 DOA: 06/23/2021 PCP: Patient, No Pcp Per (Inactive)    Brief Narrative:  Daryl [name] is a 60 y.o. male with medical history significant of nonischemic cardiomyopathy with ejection fraction of 20%, hypertension, presents to the emergency room with complaints of shortness of breath.  Reports that he has noticed worsening lower extremity over the past 3 to 4 days.  He is also developed some shortness of breath, but is now also having orthopnea.  He woke up this morning feeling short of breath.  Denies any chest pain.  He has not been taking his Lasix at home.  Says he does not have any Lasix.  He was evaluated in emergency room where chest x-ray showed CHF.  BNP elevated.  He was noted to be tachypneic, although still on room air.  He received intravenous Lasix with good urine output.  Still has evidence of volume overload.  Admission requested for further diuresis.   Assessment & Plan:   Active Problems:   Alcohol withdrawal delirium (HCC)   Nonischemic cardiomyopathy (HCC)   Essential hypertension   Acute on chronic systolic CHF (congestive heart failure) (HCC)   A/p 1)HFrEF/Acute on chronic systolic congestive heart failure -Echocardiogram with EF of 20 to 25% -Suspect decompensation related to medication noncompliance -It appears that he has a poor understanding of his underlying disease process as well as his overall medication regimen -Restarted on BB, ARB (currently on Avapro, Toprol-XL) --Continue Lasix and Aldactone with potassium supplementation -Patient still refusing or unable to cooperate with voiding into urinal so urine output is inadequate -Dr. Kerry Hough previously discussed with Dr. Diona Browner, -Needs to follow-up with general cardiology and advanced heart failure clinic on discharge   2)Hypertension -See cardiovascular medications as above #1   3)Alcohol Abuse with DTs -Patient with significant alcohol  withdrawal symptoms requiring frequent doses of IV lorazepam -Give lorazepam per CIWA protocol, thiamine and folic acid as ordered -Okay to schedule Librium 5 mg 3 times daily  4)NSVT -Having episodes of NSVT on telemetry -electrolytes replaced -Dr. Kerry Hough previously discussed with Dr. Diona Browner   -no indication for defibrillator eval since his EF could potentially improve with medication compliance -will need cardiology follow up post discharge   DVT prophylaxis: enoxaparin (LOVENOX) injection 40 mg Start: 06/23/21 0945  Code Status: Full code Family Communication: Attempted to reach patient's mother at [phone]--no answer Disposition Plan: Status is: Inpatient  Remains inpatient appropriate because: Needs continued diuresis and IV lorazepam for DTs  Consultants:  Curbside consultation with cardiology Procedures:  Echo: EF of 20%  Antimicrobials:     Subjective: -Full-blown DTs persist, patient remains intermittently agitated, -Patient becoming sleepy after lorazepam/Haldol but then when the medications wear off he becomes agitated and restless again -Still requiring one-to-one observation/sitter  Objective: Vitals:   06/27/21 0404 06/27/21 0900 06/27/21 1315 06/27/21 1632  BP:  90/61 106/77 97/78  Pulse:  81 81 88  Resp:  18 20   Temp:    (!) 97.4 F (36.3 C)  TempSrc:    Axillary  SpO2:  98% 99% 97%  Weight: 71.3 kg     Height:        Intake/Output Summary (Last 24 hours) at 06/27/2021 1709 Last data filed at 06/27/2021 1503 Gross per 24 hour  Intake 0 ml  Output 1200 ml  Net -1200 ml   Filed Weights   06/25/21 0500 06/26/21 0325 06/27/21 0404  Weight: 76.3 kg 74.2 kg 71.3  kg   Physical Exam  Gen:- Awake Alert, restless and agitated from time to time,  HEENT:- [place].AT, No sclera icterus Neck-Supple Neck,No JVD,.  Lungs-  CTAB , fair air movement bilaterally  CV- S1, S2 normal, RRR Abd-  +ve B.Sounds, Abd Soft, No tenderness,    Extremity/Skin:- No   edema,   good pedal pulses  Psych-affect is anxious, restless, agitated, very poor judgment and very poor insight Neuro-generalized weakness, no new focal deficits, +ve tremors .  Data Reviewed: I have personally reviewed following labs and imaging studies  CBC: Recent Labs  Lab 06/23/21 0630 06/26/21 0524  WBC 8.4 6.5  NEUTROABS 5.2  --   HGB 15.8 15.5  HCT 50.3 48.6  MCV 89.3 86.6  PLT 322 326   Basic Metabolic Panel: Recent Labs  Lab 06/23/21 0630 06/23/21 2242 06/24/21 0508 06/25/21 1359 06/26/21 0524  NA 142  --  138 138 139  K 3.9 4.1 3.5 4.3 4.1  CL 108  --  101 104 105  CO2 25  --  29 27 25   GLUCOSE 124*  --  74 111* 95  BUN 12  --  10 18 15   CREATININE 1.07  --  1.10 1.33* 1.10  CALCIUM 8.8*  --  8.6* 8.7* 8.8*  MG  --  1.8 2.0  --  2.0   GFR: Estimated Creatinine Clearance: 72.3 mL/min (by C-G formula based on SCr of 1.1 mg/dL). Liver Function Tests: Recent Labs  Lab 06/23/21 0637  AST 36  ALT 35  ALKPHOS 114  BILITOT 1.3*  PROT 6.6  ALBUMIN 3.3*   Recent Labs  Lab 06/23/21 0637  LIPASE 43   No results for input(s): AMMONIA in the last 168 hours. Coagulation Profile: No results for input(s): INR, PROTIME in the last 168 hours. Cardiac Enzymes: No results for input(s): CKTOTAL, CKMB, CKMBINDEX, TROPONINI in the last 168 hours. BNP (last 3 results) No results for input(s): PROBNP in the last 8760 hours. HbA1C: No results for input(s): HGBA1C in the last 72 hours. CBG: No results for input(s): GLUCAP in the last 168 hours. Lipid Profile: No results for input(s): CHOL, HDL, LDLCALC, TRIG, CHOLHDL, LDLDIRECT in the last 72 hours. Thyroid Function Tests: No results for input(s): TSH, T4TOTAL, FREET4, T3FREE, THYROIDAB in the last 72 hours. Anemia Panel: No results for input(s): VITAMINB12, FOLATE, FERRITIN, TIBC, IRON, RETICCTPCT in the last 72 hours. Sepsis Labs: No results for input(s): PROCALCITON, LATICACIDVEN in the last 168  hours.  Recent Results (from the past 240 hour(s))  Resp Panel by RT-PCR (Flu A&B, Covid) Nasopharyngeal Swab     Status: None   Collection Time: 06/23/21  6:40 AM   Specimen: Nasopharyngeal Swab; Nasopharyngeal(NP) swabs in vial transport medium  Result Value Ref Range Status   SARS Coronavirus 2 by RT PCR NEGATIVE NEGATIVE Final    Comment: (NOTE) SARS-CoV-2 target nucleic acids are NOT DETECTED.  The SARS-CoV-2 RNA is generally detectable in upper respiratory specimens during the acute phase of infection. The lowest concentration of SARS-CoV-2 viral copies this assay can detect is 138 copies/mL. A negative result does not preclude SARS-Cov-2 infection and should not be used as the sole basis for treatment or other patient management decisions. A negative result may occur with  improper specimen collection/handling, submission of specimen other than nasopharyngeal swab, presence of viral mutation(s) within the areas targeted by this assay, and inadequate number of viral copies(<138 copies/mL). A negative result must be combined with clinical observations, patient history,  and epidemiological information. The expected result is Negative.  Fact Sheet for Patients:  BloggerCourse.com  Fact Sheet for Healthcare Providers:  SeriousBroker.it  This test is no t yet approved or cleared by the [place] FDA and  has been authorized for detection and/or diagnosis of SARS-CoV-2 by FDA under an Emergency Use Authorization (EUA). This EUA will remain  in effect (meaning this test can be used) for the duration of the COVID-19 declaration under Section 564(b)(1) of the Act, 21 U.S.C.section 360bbb-3(b)(1), unless the authorization is terminated  or revoked sooner.       Influenza A by PCR NEGATIVE NEGATIVE Final   Influenza B by PCR NEGATIVE NEGATIVE Final    Comment: (NOTE) The Xpert Xpress SARS-CoV-2/FLU/RSV plus assay is intended  as an aid in the diagnosis of influenza from Nasopharyngeal swab specimens and should not be used as a sole basis for treatment. Nasal washings and aspirates are unacceptable for Xpert Xpress SARS-CoV-2/FLU/RSV testing.  Fact Sheet for Patients: BloggerCourse.com  Fact Sheet for Healthcare Providers: SeriousBroker.it  This test is not yet approved or cleared by the [place] FDA and has been authorized for detection and/or diagnosis of SARS-CoV-2 by FDA under an Emergency Use Authorization (EUA). This EUA will remain in effect (meaning this test can be used) for the duration of the COVID-19 declaration under Section 564(b)(1) of the Act, 21 U.S.C. section 360bbb-3(b)(1), unless the authorization is terminated or revoked.  Performed at [facility], [address], [place], [place] [address]      Radiology Studies: No results found.  Scheduled Meds:  atorvastatin  40 mg Oral Daily   enoxaparin (LOVENOX) injection  40 mg Subcutaneous Q24H   folic acid  1 mg Oral Daily   furosemide  20 mg Intravenous BID   irbesartan  37.5 mg Oral Daily   metoprolol succinate  50 mg Oral Daily   multivitamin with minerals  1 tablet Oral Daily   potassium chloride  40 mEq Oral BID   spironolactone  12.5 mg Oral Daily   thiamine  100 mg Oral Daily   Or   thiamine  100 mg Intravenous Daily   Continuous Infusions:  dextrose 5 % and 0.45% NaCl       LOS: 3 days   Shon Hale, MD [facility]  If 7PM-7AM, please contact night-coverage [email or web address]  06/27/2021, 5:09 PM

## 2021-06-27 NOTE — Progress Notes (Signed)
   06/27/21 1815  Safety Observation   Observer at bedside Yes
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
## 2021-06-28 ENCOUNTER — Inpatient Hospital Stay (HOSPITAL_COMMUNITY): Payer: Medicaid Other

## 2021-06-28 DIAGNOSIS — F10931 Alcohol use, unspecified with withdrawal delirium: Secondary | ICD-10-CM | POA: Diagnosis not present

## 2021-06-28 DIAGNOSIS — I10 Essential (primary) hypertension: Secondary | ICD-10-CM | POA: Diagnosis not present

## 2021-06-28 DIAGNOSIS — I5023 Acute on chronic systolic (congestive) heart failure: Secondary | ICD-10-CM | POA: Diagnosis not present

## 2021-06-28 DIAGNOSIS — I428 Other cardiomyopathies: Secondary | ICD-10-CM | POA: Diagnosis not present

## 2021-06-28 LAB — GLUCOSE, CAPILLARY
Glucose-Capillary: 110 mg/dL — ABNORMAL HIGH (ref 70–99)
Glucose-Capillary: 116 mg/dL — ABNORMAL HIGH (ref 70–99)
Glucose-Capillary: 123 mg/dL — ABNORMAL HIGH (ref 70–99)
Glucose-Capillary: 129 mg/dL — ABNORMAL HIGH (ref 70–99)
Glucose-Capillary: 145 mg/dL — ABNORMAL HIGH (ref 70–99)

## 2021-06-28 LAB — CBC
HCT: 54.9 % — ABNORMAL HIGH (ref 39.0–52.0)
Hemoglobin: 17.6 g/dL — ABNORMAL HIGH (ref 13.0–17.0)
MCH: 27.6 pg (ref 26.0–34.0)
MCHC: 32.1 g/dL (ref 30.0–36.0)
MCV: 86.2 fL (ref 80.0–100.0)
Platelets: 408 10*3/uL — ABNORMAL HIGH (ref 150–400)
RBC: 6.37 MIL/uL — ABNORMAL HIGH (ref 4.22–5.81)
RDW: 15.6 % — ABNORMAL HIGH (ref 11.5–15.5)
WBC: 8.3 10*3/uL (ref 4.0–10.5)
nRBC: 0 % (ref 0.0–0.2)

## 2021-06-28 LAB — BASIC METABOLIC PANEL
Anion gap: 11 (ref 5–15)
BUN: 15 mg/dL (ref 6–20)
CO2: 23 mmol/L (ref 22–32)
Calcium: 9.2 mg/dL (ref 8.9–10.3)
Chloride: 104 mmol/L (ref 98–111)
Creatinine, Ser: 1.09 mg/dL (ref 0.61–1.24)
GFR, Estimated: >60 mL/min (ref >60)
Glucose, Bld: 117 mg/dL — ABNORMAL HIGH (ref 70–99)
Potassium: 3.4 mmol/L — ABNORMAL LOW (ref 3.5–5.1)
Sodium: 138 mmol/L (ref 135–145)

## 2021-06-28 MED ORDER — POTASSIUM CHLORIDE CRYS ER 20 MEQ PO TBCR
40.0000 meq | EXTENDED_RELEASE_TABLET | ORAL | Status: AC
  Administered 2021-06-28: 40 meq via ORAL
  Filled 2021-06-28: qty 2

## 2021-06-28 NOTE — Progress Notes (Signed)
PROGRESS NOTE    Daryl [name]  [id number] DOB: [birth year] DOA: 06/23/2021 PCP: Patient, No Pcp Per (Inactive)    Brief Narrative:  Daryl [name] is a 60 y.o. male with medical history significant of nonischemic cardiomyopathy with ejection fraction of 20%, hypertension, presents to the emergency room with complaints of shortness of breath.  Reports that he has noticed worsening lower extremity over the past 3 to 4 days.  He is also developed some shortness of breath, but is now also having orthopnea.  He woke up this morning feeling short of breath.  Denies any chest pain.  He has not been taking his Lasix at home.  Says he does not have any Lasix.  He was evaluated in emergency room where chest x-ray showed CHF.  BNP elevated.  He was noted to be tachypneic, although still on room air.  He received intravenous Lasix with good urine output.  Still has evidence of volume overload.  Admission requested for further diuresis.   Assessment & Plan:   Active Problems:   Alcohol withdrawal delirium (HCC)   Nonischemic cardiomyopathy (HCC)   Essential hypertension   Acute on chronic systolic CHF (congestive heart failure) (HCC)   A/p 1)HFrEF/Acute on chronic systolic congestive heart failure -Echocardiogram with EF of 20 to 25% -Suspect decompensation related to medication noncompliance -It appears that he has a poor understanding of his underlying disease process as well as his overall medication regimen -Restarted on BB, ARB (currently on Avapro, Toprol-XL) --Continue iv Lasix and Aldactone with potassium supplementation -Patient still refusing or unable to cooperate with voiding into urinal so urine output is inadequate -Dr. Roderic Palau previously discussed with Dr. Domenic Polite, -Needs to follow-up with general cardiology and advanced heart failure clinic on discharge -Repeat chest x-ray in a.m.   2)Hypertension -See cardiovascular medications as above #1   3)Alcohol Abuse with DTs -Patient  with significant alcohol withdrawal symptoms requiring frequent doses of IV lorazepam -Give lorazepam per CIWA protocol, thiamine and folic acid as ordered -Okay to schedule Librium 5 mg 3 times daily  4)NSVT -Having episodes of NSVT on telemetry -electrolytes replaced -Dr. Roderic Palau previously discussed with Dr. Domenic Polite   -no indication for defibrillator eval since his EF could potentially improve with medication compliance -will need cardiology follow up post discharge   DVT prophylaxis: enoxaparin (LOVENOX) injection 40 mg Start: 06/23/21 0945  Code Status: Full code Family Communication: Attempted to reach patient's mother at [phone]--no answer Disposition Plan: Status is: Inpatient  Remains inpatient appropriate because: Needs continued iv diuresis and IV lorazepam for DTs  Consultants:  Curbside consultation with cardiology Procedures:  Echo: EF of 20%  Antimicrobials:     Subjective: -DT symptoms persist, requiring one-to-one sitter, requiring IV lorazepam and IM Haldol  --oral intake unreliable, Attempted to reach patient's mother at [phone]--no answer  Objective: Vitals:   06/28/21 0500 06/28/21 0631 06/28/21 0838 06/28/21 1257  BP:  118/89 96/73 99/73   Pulse:  87 87 87  Resp: 14 16  16   Temp:  (!) 97.5 F (36.4 C) (!) 97.3 F (36.3 C) (!) 97.5 F (36.4 C)  TempSrc:  Axillary    SpO2:  99% 100% 100%  Weight: 69.6 kg     Height: 5\' 9"  (1.753 m)       Intake/Output Summary (Last 24 hours) at 06/28/2021 1527 Last data filed at 06/28/2021 1500 Gross per 24 hour  Intake 2521.71 ml  Output 1250 ml  Net 1271.71 ml   [employer]  06/26/21 0325 06/27/21 0404 06/28/21 0500  Weight: 74.2 kg 71.3 kg 69.6 kg   Physical Exam  Gen:- Awake, restless and agitated from time to time,  HEENT:- [place].AT, No sclera icterus Neck-Supple Neck,No JVD,.  Lungs-  CTAB , fair air movement bilaterally  CV- S1, S2 normal, RRR Abd-  +ve B.Sounds, Abd Soft, No  tenderness,    Extremity/Skin:- No  edema,   good pedal pulses  Psych-affect is anxious, restless, agitated, very poor judgment and very poor insight Neuro-generalized weakness, no new focal deficits, +ve tremors .  Data Reviewed: I have personally reviewed following labs and imaging studies  CBC: Recent Labs  Lab 06/23/21 0630 06/26/21 0524 06/28/21 0450  WBC 8.4 6.5 8.3  NEUTROABS 5.2  --   --   HGB 15.8 15.5 17.6*  HCT 50.3 48.6 54.9*  MCV 89.3 86.6 86.2  PLT 322 326 [id number]*   Basic Metabolic Panel: Recent Labs  Lab 06/23/21 0630 06/23/21 2242 06/24/21 0508 06/25/21 1359 06/26/21 0524 06/28/21 0450  NA 142  --  138 138 139 138  K 3.9 4.1 3.5 4.3 4.1 3.4*  CL 108  --  101 104 105 104  CO2 25  --  29 27 25 23   GLUCOSE 124*  --  74 111* 95 117*  BUN 12  --  10 18 15 15   CREATININE 1.07  --  1.10 1.33* 1.10 1.09  CALCIUM 8.8*  --  8.6* 8.7* 8.8* 9.2  MG  --  1.8 2.0  --  2.0  --    GFR: Estimated Creatinine Clearance: 71.8 mL/min (by C-G formula based on SCr of 1.09 mg/dL). Liver Function Tests: Recent Labs  Lab 06/23/21 0637  AST 36  ALT 35  ALKPHOS 114  BILITOT 1.3*  PROT 6.6  ALBUMIN 3.3*   Recent Labs  Lab 06/23/21 0637  LIPASE 43   No results for input(s): AMMONIA in the last 168 hours. Coagulation Profile: No results for input(s): INR, PROTIME in the last 168 hours. Cardiac Enzymes: No results for input(s): CKTOTAL, CKMB, CKMBINDEX, TROPONINI in the last 168 hours. BNP (last 3 results) No results for input(s): PROBNP in the last 8760 hours. HbA1C: No results for input(s): HGBA1C in the last 72 hours. CBG: Recent Labs  Lab 06/27/21 1720 06/28/21 0004 06/28/21 0643 06/28/21 1146  GLUCAP 88 145* 110* 129*   Lipid Profile: No results for input(s): CHOL, HDL, LDLCALC, TRIG, CHOLHDL, LDLDIRECT in the last 72 hours. Thyroid Function Tests: No results for input(s): TSH, T4TOTAL, FREET4, T3FREE, THYROIDAB in the last 72 hours. Anemia Panel: No  results for input(s): VITAMINB12, FOLATE, FERRITIN, TIBC, IRON, RETICCTPCT in the last 72 hours. Sepsis Labs: No results for input(s): PROCALCITON, LATICACIDVEN in the last 168 hours.  Recent Results (from the past 240 hour(s))  Resp Panel by RT-PCR (Flu A&B, Covid) Nasopharyngeal Swab     Status: None   Collection Time: 06/23/21  6:40 AM   Specimen: Nasopharyngeal Swab; Nasopharyngeal(NP) swabs in vial transport medium  Result Value Ref Range Status   SARS Coronavirus 2 by RT PCR NEGATIVE NEGATIVE Final    Comment: (NOTE) SARS-CoV-2 target nucleic acids are NOT DETECTED.  The SARS-CoV-2 RNA is generally detectable in upper respiratory specimens during the acute phase of infection. The lowest concentration of SARS-CoV-2 viral copies this assay can detect is 138 copies/mL. A negative result does not preclude SARS-Cov-2 infection and should not be used as the sole basis for treatment or other patient management decisions. A  negative result may occur with  improper specimen collection/handling, submission of specimen other than nasopharyngeal swab, presence of viral mutation(s) within the areas targeted by this assay, and inadequate number of viral copies(<138 copies/mL). A negative result must be combined with clinical observations, patient history, and epidemiological information. The expected result is Negative.  Fact Sheet for Patients:  BloggerCourse.com  Fact Sheet for Healthcare Providers:  SeriousBroker.it  This test is no t yet approved or cleared by the [place] FDA and  has been authorized for detection and/or diagnosis of SARS-CoV-2 by FDA under an Emergency Use Authorization (EUA). This EUA will remain  in effect (meaning this test can be used) for the duration of the COVID-19 declaration under Section 564(b)(1) of the Act, 21 U.S.C.section 360bbb-3(b)(1), unless the authorization is terminated  or revoked sooner.        Influenza A by PCR NEGATIVE NEGATIVE Final   Influenza B by PCR NEGATIVE NEGATIVE Final    Comment: (NOTE) The Xpert Xpress SARS-CoV-2/FLU/RSV plus assay is intended as an aid in the diagnosis of influenza from Nasopharyngeal swab specimens and should not be used as a sole basis for treatment. Nasal washings and aspirates are unacceptable for Xpert Xpress SARS-CoV-2/FLU/RSV testing.  Fact Sheet for Patients: BloggerCourse.com  Fact Sheet for Healthcare Providers: SeriousBroker.it  This test is not yet approved or cleared by the [place] FDA and has been authorized for detection and/or diagnosis of SARS-CoV-2 by FDA under an Emergency Use Authorization (EUA). This EUA will remain in effect (meaning this test can be used) for the duration of the COVID-19 declaration under Section 564(b)(1) of the Act, 21 U.S.C. section 360bbb-3(b)(1), unless the authorization is terminated or revoked.  Performed at [facility], [address], [place], [place] [address]      Radiology Studies: No results found.  Scheduled Meds:  atorvastatin  40 mg Oral Daily   chlordiazePOXIDE  5 mg Oral TID   enoxaparin (LOVENOX) injection  40 mg Subcutaneous Q24H   folic acid  1 mg Oral Daily   furosemide  20 mg Intravenous BID   irbesartan  37.5 mg Oral Daily   metoprolol succinate  50 mg Oral Daily   multivitamin with minerals  1 tablet Oral Daily   potassium chloride  40 mEq Oral BID   spironolactone  12.5 mg Oral Daily   thiamine  100 mg Oral Daily   Or   thiamine  100 mg Intravenous Daily   Continuous Infusions:  dextrose 5 % and 0.45% NaCl 125 mL/hr at 06/28/21 0844     LOS: 4 days   Shon Hale, MD [facility]  If 7PM-7AM, please contact night-coverage [email or web address]  06/28/2021, 3:27 PM

## 2021-06-28 NOTE — Telephone Encounter (Signed)
Patient was seen in the ER

## 2021-06-28 NOTE — Progress Notes (Signed)
Spoke with mother, Eber Jones, and she said she was unable to sit with patient today.  She stated she was not aware that patient had alcohol problem
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
## 2021-06-29 DIAGNOSIS — I10 Essential (primary) hypertension: Secondary | ICD-10-CM | POA: Diagnosis not present

## 2021-06-29 DIAGNOSIS — I428 Other cardiomyopathies: Secondary | ICD-10-CM | POA: Diagnosis not present

## 2021-06-29 DIAGNOSIS — F10931 Alcohol use, unspecified with withdrawal delirium: Secondary | ICD-10-CM | POA: Diagnosis not present

## 2021-06-29 LAB — GLUCOSE, CAPILLARY
Glucose-Capillary: 110 mg/dL — ABNORMAL HIGH (ref 70–99)
Glucose-Capillary: 115 mg/dL — ABNORMAL HIGH (ref 70–99)
Glucose-Capillary: 118 mg/dL — ABNORMAL HIGH (ref 70–99)
Glucose-Capillary: 97 mg/dL (ref 70–99)

## 2021-06-29 MED ORDER — LORAZEPAM 1 MG PO TABS: 1.0000 mg | ORAL_TABLET | ORAL | Status: DC | PRN

## 2021-06-29 MED ORDER — FUROSEMIDE 10 MG/ML IJ SOLN
20.0000 mg | Freq: Every day | INTRAMUSCULAR | Status: DC
  Administered 2021-06-30 – 2021-07-03 (×4): 20 mg via INTRAVENOUS
  Filled 2021-06-29 (×6): qty 2

## 2021-06-29 MED ORDER — LORAZEPAM 2 MG/ML IJ SOLN
1.0000 mg | INTRAMUSCULAR | Status: DC | PRN
  Administered 2021-06-30 – 2021-07-01 (×6): 2 mg via INTRAVENOUS
  Administered 2021-07-01 – 2021-07-02 (×2): 3 mg via INTRAVENOUS
  Administered 2021-07-02: 2 mg via INTRAVENOUS
  Filled 2021-06-29: qty 2
  Filled 2021-06-29 (×2): qty 1
  Filled 2021-06-29 (×2): qty 2
  Filled 2021-06-29 (×5): qty 1
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
## 2021-06-29 NOTE — Progress Notes (Signed)
PROGRESS NOTE    [name]  [id number] DOB: 11-Feb-1961 DOA: 06/23/2021 PCP: Patient, No Pcp Per (Inactive)    Brief Narrative:  Daryl [name] is a 60 y.o. male with medical history significant of nonischemic cardiomyopathy with ejection fraction of 20%, hypertension, presents to the emergency room with complaints of shortness of breath.  Reports that he has noticed worsening lower extremity over the past 3 to 4 days.  He is also developed some shortness of breath, but is now also having orthopnea.  He woke up this morning feeling short of breath.  Denies any chest pain.  He has not been taking his Lasix at home.  Says he does not have any Lasix.  He was evaluated in emergency room where chest x-ray showed CHF.  BNP elevated.  He was noted to be tachypneic, although still on room air.  He received intravenous Lasix with good urine output.  Still has evidence of volume overload.  Admission requested for further diuresis.   Assessment & Plan:   Active Problems:   Alcohol withdrawal delirium (HCC)   Nonischemic cardiomyopathy (HCC)   Essential hypertension   Acute on chronic systolic CHF (congestive heart failure) (HCC)   A/p 1)HFrEF/Acute on chronic systolic congestive heart failure -Echocardiogram with EF of 20 to 25% -Suspect decompensation related to medication noncompliance -It appears that he has a poor understanding of his underlying disease process as well as his overall medication regimen -Restarted on BB, ARB (currently on Avapro, Toprol-XL) --Continue iv Lasix and Aldactone with potassium supplementation -Patient still refusing or unable to cooperate with voiding into urinal so urine output is inadequate -Dr. Roderic Palau previously discussed with Dr. Domenic Polite, -Needs to follow-up with general cardiology and advanced heart failure clinic on discharge -Repeat chest x-ray on 06/28/2021 showed resolution of pulmonary edema and resolution of CHF -Decrease IV Lasix to 20 mg daily from  twice daily, continue Aldactone with potassium supplementation   2)Hypertension -See cardiovascular medications as above #1   3)Alcohol Abuse with DTs -Patient with significant alcohol withdrawal symptoms requiring frequent doses of IV lorazepam -Give lorazepam per CIWA protocol, thiamine and folic acid as ordered -c/n schedule Librium 5 mg 3 times daily - 4)NSVT -Having episodes of NSVT on telemetry -electrolytes replaced -Dr. Roderic Palau previously discussed with Dr. Domenic Polite   -no indication for defibrillator eval since his EF could potentially improve with medication compliance -will need cardiology follow up post discharge  5)Social/Ethics--multiple calls/attempts to reach patient's mother-- -[name] [phone]--- repeatedly voicemail full unable to leave messages -Tried [phone]--- does not appear to be a working number  DVT prophylaxis: enoxaparin (LOVENOX) injection 40 mg Start: 06/23/21 0945  Code Status: Full code Family Communication: Attempted to reach patient's mother at [phone]--no answer Disposition Plan: Status is: Inpatient  Remains inpatient appropriate because: Needs continued iv diuresis and IV lorazepam for DTs  Consultants:  Curbside consultation with cardiology Procedures:  Echo: EF of 20%  Antimicrobials:     Subjective: -DT symptoms persist, requiring one-to-one sitter, requiring IV lorazepam and IM Haldol  --oral intake unreliable--- still needs IV fluids -Refusing oral medications from time to time -Trying to reach patient's mother- [name] [phone]--- repeatedly voicemail full unable to leave messages -Tried [phone]--- does not appear to be a working number  Objective: Vitals:   06/29/21 0503 06/29/21 0800 06/29/21 1200 06/29/21 1346  BP: 109/69 109/85 114/74 115/80  Pulse: (!) 104 98 94 96  Resp: 20   18  Temp: 98.3 F (36.8 C)   98.2  F (36.8 C)  TempSrc:    Axillary  SpO2: 100%   95%  Weight:      Height:         Intake/Output Summary (Last 24 hours) at 06/29/2021 1601 Last data filed at 06/29/2021 1300 Gross per 24 hour  Intake 400.22 ml  Output 500 ml  Net -99.78 ml   Filed Weights   06/27/21 0404 06/28/21 0500 06/29/21 0500  Weight: 71.3 kg 69.6 kg 69.9 kg   Physical Exam  Gen:- Awake, restless and agitated from time to time,  HEENT:- [place].AT, No sclera icterus Neck-Supple Neck,No JVD,.  Lungs-  CTAB , fair air movement bilaterally  CV- S1, S2 normal, RRR Abd-  +ve B.Sounds, Abd Soft, No tenderness,    Extremity/Skin:- No  edema,   good pedal pulses  Psych-affect is anxious, restless, agitated, very poor judgment and very poor insight Neuro-generalized weakness, no new focal deficits, +ve tremors .  Data Reviewed: I have personally reviewed following labs and imaging studies  CBC: Recent Labs  Lab 06/23/21 0630 06/26/21 0524 06/28/21 0450  WBC 8.4 6.5 8.3  NEUTROABS 5.2  --   --   HGB 15.8 15.5 17.6*  HCT 50.3 48.6 54.9*  MCV 89.3 86.6 86.2  PLT 322 326 [id number]*   Basic Metabolic Panel: Recent Labs  Lab 06/23/21 0630 06/23/21 2242 06/24/21 0508 06/25/21 1359 06/26/21 0524 06/28/21 0450  NA 142  --  138 138 139 138  K 3.9 4.1 3.5 4.3 4.1 3.4*  CL 108  --  101 104 105 104  CO2 25  --  29 27 25 23   GLUCOSE 124*  --  74 111* 95 117*  BUN 12  --  10 18 15 15   CREATININE 1.07  --  1.10 1.33* 1.10 1.09  CALCIUM 8.8*  --  8.6* 8.7* 8.8* 9.2  MG  --  1.8 2.0  --  2.0  --    GFR: Estimated Creatinine Clearance: 72.1 mL/min (by C-G formula based on SCr of 1.09 mg/dL). Liver Function Tests: Recent Labs  Lab 06/23/21 0637  AST 36  ALT 35  ALKPHOS 114  BILITOT 1.3*  PROT 6.6  ALBUMIN 3.3*   Recent Labs  Lab 06/23/21 0637  LIPASE 43   No results for input(s): AMMONIA in the last 168 hours. Coagulation Profile: No results for input(s): INR, PROTIME in the last 168 hours. Cardiac Enzymes: No results for input(s): CKTOTAL, CKMB, CKMBINDEX, TROPONINI in the last  168 hours. BNP (last 3 results) No results for input(s): PROBNP in the last 8760 hours. HbA1C: No results for input(s): HGBA1C in the last 72 hours. CBG: Recent Labs  Lab 06/28/21 1146 06/28/21 1752 06/28/21 2200 06/29/21 0504 06/29/21 1110  GLUCAP 129* 123* 116* 118* 115*   Lipid Profile: No results for input(s): CHOL, HDL, LDLCALC, TRIG, CHOLHDL, LDLDIRECT in the last 72 hours. Thyroid Function Tests: No results for input(s): TSH, T4TOTAL, FREET4, T3FREE, THYROIDAB in the last 72 hours. Anemia Panel: No results for input(s): VITAMINB12, FOLATE, FERRITIN, TIBC, IRON, RETICCTPCT in the last 72 hours. Sepsis Labs: No results for input(s): PROCALCITON, LATICACIDVEN in the last 168 hours.  Recent Results (from the past 240 hour(s))  Resp Panel by RT-PCR (Flu A&B, Covid) Nasopharyngeal Swab     Status: None   Collection Time: 06/23/21  6:40 AM   Specimen: Nasopharyngeal Swab; Nasopharyngeal(NP) swabs in vial transport medium  Result Value Ref Range Status   SARS Coronavirus 2 by RT PCR NEGATIVE NEGATIVE  Final    Comment: (NOTE) SARS-CoV-2 target nucleic acids are NOT DETECTED.  The SARS-CoV-2 RNA is generally detectable in upper respiratory specimens during the acute phase of infection. The lowest concentration of SARS-CoV-2 viral copies this assay can detect is 138 copies/mL. A negative result does not preclude SARS-Cov-2 infection and should not be used as the sole basis for treatment or other patient management decisions. A negative result may occur with  improper specimen collection/handling, submission of specimen other than nasopharyngeal swab, presence of viral mutation(s) within the areas targeted by this assay, and inadequate number of viral copies(<138 copies/mL). A negative result must be combined with clinical observations, patient history, and epidemiological information. The expected result is Negative.  Fact Sheet for Patients:   BloggerCourse.com  Fact Sheet for Healthcare Providers:  SeriousBroker.it  This test is no t yet approved or cleared by the [place] FDA and  has been authorized for detection and/or diagnosis of SARS-CoV-2 by FDA under an Emergency Use Authorization (EUA). This EUA will remain  in effect (meaning this test can be used) for the duration of the COVID-19 declaration under Section 564(b)(1) of the Act, 21 U.S.C.section 360bbb-3(b)(1), unless the authorization is terminated  or revoked sooner.       Influenza A by PCR NEGATIVE NEGATIVE Final   Influenza B by PCR NEGATIVE NEGATIVE Final    Comment: (NOTE) The Xpert Xpress SARS-CoV-2/FLU/RSV plus assay is intended as an aid in the diagnosis of influenza from Nasopharyngeal swab specimens and should not be used as a sole basis for treatment. Nasal washings and aspirates are unacceptable for Xpert Xpress SARS-CoV-2/FLU/RSV testing.  Fact Sheet for Patients: BloggerCourse.com  Fact Sheet for Healthcare Providers: SeriousBroker.it  This test is not yet approved or cleared by the [place] FDA and has been authorized for detection and/or diagnosis of SARS-CoV-2 by FDA under an Emergency Use Authorization (EUA). This EUA will remain in effect (meaning this test can be used) for the duration of the COVID-19 declaration under Section 564(b)(1) of the Act, 21 U.S.C. section 360bbb-3(b)(1), unless the authorization is terminated or revoked.  Performed at [facility], [address], [place], [place] [address]      Radiology Studies: DG CHEST PORT 1 VIEW  Result Date: 06/28/2021 CLINICAL DATA:  Shortness of breath EXAM: PORTABLE CHEST 1 VIEW COMPARISON:  Previous studies including the examination of 06/23/2021 FINDINGS: Transverse diameter of heart is increased. There is interval clearing of pulmonary vascular congestion and  pulmonary edema. There are no new focal infiltrates. There is no significant pleural effusion or pneumothorax. IMPRESSION: There is interval clearing of pulmonary vascular congestion and pulmonary edema. There is interval decrease in transverse diameter of heart. There are no new focal infiltrates. There is no pleural effusion or pneumothorax. Electronically Signed   By: Ernie Avena M.D.   On: 06/28/2021 16:15    Scheduled Meds:  atorvastatin  40 mg Oral Daily   chlordiazePOXIDE  5 mg Oral TID   enoxaparin (LOVENOX) injection  40 mg Subcutaneous Q24H   folic acid  1 mg Oral Daily   [START ON 06/30/2021] furosemide  20 mg Intravenous Daily   irbesartan  37.5 mg Oral Daily   metoprolol succinate  50 mg Oral Daily   multivitamin with minerals  1 tablet Oral Daily   potassium chloride  40 mEq Oral BID   spironolactone  12.5 mg Oral Daily   thiamine  100 mg Oral Daily   Or   thiamine  100 mg  Intravenous Daily   Continuous Infusions:  dextrose 5 % and 0.45% NaCl 20 mL/hr at 06/28/21 1739     LOS: 5 days   Roxan Hockey, MD [facility]  If 7PM-7AM, please contact night-coverage [email or web address]  06/29/2021, 4:01 PM
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
## 2021-06-30 ENCOUNTER — Inpatient Hospital Stay (HOSPITAL_COMMUNITY): Payer: Medicaid Other

## 2021-06-30 DIAGNOSIS — I10 Essential (primary) hypertension: Secondary | ICD-10-CM | POA: Diagnosis not present

## 2021-06-30 DIAGNOSIS — I5023 Acute on chronic systolic (congestive) heart failure: Secondary | ICD-10-CM | POA: Diagnosis not present

## 2021-06-30 DIAGNOSIS — F10931 Alcohol use, unspecified with withdrawal delirium: Secondary | ICD-10-CM | POA: Diagnosis not present

## 2021-06-30 DIAGNOSIS — I428 Other cardiomyopathies: Secondary | ICD-10-CM | POA: Diagnosis not present

## 2021-06-30 LAB — RENAL FUNCTION PANEL
Albumin: 3.8 g/dL (ref 3.5–5.0)
Anion gap: 12 (ref 5–15)
BUN: 14 mg/dL (ref 6–20)
CO2: 23 mmol/L (ref 22–32)
Calcium: 9.4 mg/dL (ref 8.9–10.3)
Chloride: 104 mmol/L (ref 98–111)
Creatinine, Ser: 1.1 mg/dL (ref 0.61–1.24)
GFR, Estimated: >60 mL/min (ref >60)
Glucose, Bld: 107 mg/dL — ABNORMAL HIGH (ref 70–99)
Phosphorus: 3.3 mg/dL (ref 2.5–4.6)
Potassium: 3.9 mmol/L (ref 3.5–5.1)
Sodium: 139 mmol/L (ref 135–145)

## 2021-06-30 LAB — CBC
HCT: 56.2 % — ABNORMAL HIGH (ref 39.0–52.0)
Hemoglobin: 17.8 g/dL — ABNORMAL HIGH (ref 13.0–17.0)
MCH: 27.6 pg (ref 26.0–34.0)
MCHC: 31.7 g/dL (ref 30.0–36.0)
MCV: 87 fL (ref 80.0–100.0)
Platelets: 365 10*3/uL (ref 150–400)
RBC: 6.46 MIL/uL — ABNORMAL HIGH (ref 4.22–5.81)
RDW: 15.6 % — ABNORMAL HIGH (ref 11.5–15.5)
WBC: 9 10*3/uL (ref 4.0–10.5)
nRBC: 0 % (ref 0.0–0.2)

## 2021-06-30 LAB — GLUCOSE, CAPILLARY: Glucose-Capillary: 137 mg/dL — ABNORMAL HIGH (ref 70–99)

## 2021-06-30 NOTE — Progress Notes (Signed)
PROGRESS NOTE    Daryl [name]  KDT:[id number] DOB: [birth year] DOA: 06/23/2021 PCP: Patient, No Pcp Per (Inactive)    Brief Narrative:  Daryl [name] is a 60 y.o. male with medical history significant of nonischemic cardiomyopathy with ejection fraction of 20%, hypertension, presents to the emergency room with complaints of shortness of breath.  Reports that he has noticed worsening lower extremity over the past 3 to 4 days.  He is also developed some shortness of breath, but is now also having orthopnea.  He woke up this morning feeling short of breath.  Denies any chest pain.  He has not been taking his Lasix at home.  Says he does not have any Lasix.  He was evaluated in emergency room where chest x-ray showed CHF.  BNP elevated.  He was noted to be tachypneic, although still on room air.  He received intravenous Lasix with good urine output.  Still has evidence of volume overload.  Admission requested for further diuresis.   Assessment & Plan:   Active Problems:   Alcohol withdrawal delirium (HCC)   Nonischemic cardiomyopathy (HCC)   Essential hypertension   Acute on chronic systolic CHF (congestive heart failure) (HCC)   A/p 1)HFrEF/Acute on chronic systolic congestive heart failure -Echocardiogram with EF of 20 to 25% -Suspect decompensation related to medication noncompliance and ongoing ETOH abuse -It appears that he has a poor understanding of his underlying disease process as well as his overall medication regimen -Restarted on BB, ARB (currently on Avapro, Toprol-XL) --Continue iv Lasix and Aldactone with potassium supplementation -Patient still refusing or unable to cooperate with voiding into urinal so urine output is inadequate -Dr. Kerry Hough previously discussed with Dr. Diona Browner, -Needs to follow-up with general cardiology and advanced heart failure clinic on discharge -Repeat chest x-ray on 06/28/2021 showed resolution of pulmonary edema and resolution of CHF -c/n  IV Lasix  20 mg daily, continue Aldactone with potassium supplementation   2)Hypertension -See cardiovascular medications as above #1   3)Alcohol Abuse with DTs/acute metabolic encephalopathy -Patient with significant alcohol withdrawal symptoms requiring frequent doses of IV lorazepam -c/n  lorazepam per CIWA protocol, thiamine and folic acid as ordered -c/n schedule Librium 5 mg 3 times daily -Unable to use IV Precedex due to low EF -CT head pending  4)NSVT -Having episodes of NSVT on telemetry -electrolytes replaced -Dr. Kerry Hough previously discussed with Dr. Diona Browner   -no indication for defibrillator eval since his EF could potentially improve with medication compliance -will need cardiology follow up post discharge  5)Social/Ethics--multiple calls/attempts to reach patient's mother-- -Jamal Maes [phone]--- repeatedly voicemail full unable to leave messages -Tried [phone]--- does not appear to be a working number -Discussed with Pt's son Vaughan Basta at bedside on 06/30/21   DVT prophylaxis: enoxaparin (LOVENOX) injection 40 mg Start: 06/23/21 0945  Code Status: Full code Family Communication: Attempted to reach patient's mother at [phone]--no answer Disposition Plan: Status is: Inpatient  Remains inpatient appropriate because: Needs continued iv diuresis and IV lorazepam for DTs  Consultants:  Curbside consultation with cardiology Procedures:  Echo: EF of 20%  Antimicrobials:     Subjective: -DT symptoms persist, requiring one-to-one sitter, requiring IV lorazepam and IM Haldol  --oral intake unreliable--- still needs IV fluids -Refusing oral medications from time to time -Discussed with Pt's son Jermaine at bedside on 06/30/21   Objective: Vitals:   06/30/21 0551 06/30/21 0908 06/30/21 1200 06/30/21 1202  BP: (!) 145/84 98/76 135/83 135/83  Pulse: (!) 109 (!) 101 100  Resp: 18 16  (!) 22  Temp: 98.8 F (37.1 C) 97.7 F (36.5 C)    TempSrc: Oral Oral    SpO2: 95%  98%    Weight:      Height:        Intake/Output Summary (Last 24 hours) at 06/30/2021 1714 Last data filed at 06/30/2021 1543 Gross per 24 hour  Intake 340 ml  Output 1250 ml  Net -910 ml   Filed Weights   06/29/21 0500 06/30/21 0522 06/30/21 0545  Weight: 69.9 kg 69.2 kg 69 kg   Physical Exam  Gen:- Awake, restless and agitated from time to time,  HEENT:- [place].AT, No sclera icterus Neck-Supple Neck,No JVD,.  Lungs-  CTAB , fair air movement bilaterally  CV- S1, S2 normal, RRR Abd-  +ve B.Sounds, Abd Soft, No tenderness,    Extremity/Skin:- No  edema,   good pedal pulses  Psych-affect is anxious, restless, agitated, very poor judgment and very poor insight Neuro-generalized weakness, no new focal deficits, +ve tremors .  Data Reviewed: I have personally reviewed following labs and imaging studies  CBC: Recent Labs  Lab 06/26/21 0524 06/28/21 0450 06/30/21 0332  WBC 6.5 8.3 9.0  HGB 15.5 17.6* 17.8*  HCT 48.6 54.9* 56.2*  MCV 86.6 86.2 87.0  PLT 326 408* [id number]   Basic Metabolic Panel: Recent Labs  Lab 06/23/21 2242 06/24/21 0508 06/25/21 1359 06/26/21 0524 06/28/21 0450 06/30/21 0332  NA  --  138 138 139 138 139  K 4.1 3.5 4.3 4.1 3.4* 3.9  CL  --  101 104 105 104 104  CO2  --  29 27 25 23 23   GLUCOSE  --  74 111* 95 117* 107*  BUN  --  10 18 15 15 14   CREATININE  --  1.10 1.33* 1.10 1.09 1.10  CALCIUM  --  8.6* 8.7* 8.8* 9.2 9.4  MG 1.8 2.0  --  2.0  --   --   PHOS  --   --   --   --   --  3.3   GFR: Estimated Creatinine Clearance: 70.6 mL/min (by C-G formula based on SCr of 1.1 mg/dL). Liver Function Tests: Recent Labs  Lab 06/30/21 0332  ALBUMIN 3.8   No results for input(s): LIPASE, AMYLASE in the last 168 hours.  No results for input(s): AMMONIA in the last 168 hours. Coagulation Profile: No results for input(s): INR, PROTIME in the last 168 hours. Cardiac Enzymes: No results for input(s): CKTOTAL, CKMB, CKMBINDEX, TROPONINI in the last 168  hours. BNP (last 3 results) No results for input(s): PROBNP in the last 8760 hours. HbA1C: No results for input(s): HGBA1C in the last 72 hours. CBG: Recent Labs  Lab 06/29/21 0504 06/29/21 1110 06/29/21 1723 06/29/21 2331 06/30/21 0542  GLUCAP 118* 115* 110* 97 137*   Lipid Profile: No results for input(s): CHOL, HDL, LDLCALC, TRIG, CHOLHDL, LDLDIRECT in the last 72 hours. Thyroid Function Tests: No results for input(s): TSH, T4TOTAL, FREET4, T3FREE, THYROIDAB in the last 72 hours. Anemia Panel: No results for input(s): VITAMINB12, FOLATE, FERRITIN, TIBC, IRON, RETICCTPCT in the last 72 hours. Sepsis Labs: No results for input(s): PROCALCITON, LATICACIDVEN in the last 168 hours.  Recent Results (from the past 240 hour(s))  Resp Panel by RT-PCR (Flu A&B, Covid) Nasopharyngeal Swab     Status: None   Collection Time: 06/23/21  6:40 AM   Specimen: Nasopharyngeal Swab; Nasopharyngeal(NP) swabs in vial transport medium  Result Value Ref Range Status  SARS Coronavirus 2 by RT PCR NEGATIVE NEGATIVE Final    Comment: (NOTE) SARS-CoV-2 target nucleic acids are NOT DETECTED.  The SARS-CoV-2 RNA is generally detectable in upper respiratory specimens during the acute phase of infection. The lowest concentration of SARS-CoV-2 viral copies this assay can detect is 138 copies/mL. A negative result does not preclude SARS-Cov-2 infection and should not be used as the sole basis for treatment or other patient management decisions. A negative result may occur with  improper specimen collection/handling, submission of specimen other than nasopharyngeal swab, presence of viral mutation(s) within the areas targeted by this assay, and inadequate number of viral copies(<138 copies/mL). A negative result must be combined with clinical observations, patient history, and epidemiological information. The expected result is Negative.  Fact Sheet for Patients:   BloggerCourse.com  Fact Sheet for Healthcare Providers:  SeriousBroker.it  This test is no t yet approved or cleared by the [place] FDA and  has been authorized for detection and/or diagnosis of SARS-CoV-2 by FDA under an Emergency Use Authorization (EUA). This EUA will remain  in effect (meaning this test can be used) for the duration of the COVID-19 declaration under Section 564(b)(1) of the Act, 21 U.S.C.section 360bbb-3(b)(1), unless the authorization is terminated  or revoked sooner.       Influenza A by PCR NEGATIVE NEGATIVE Final   Influenza B by PCR NEGATIVE NEGATIVE Final    Comment: (NOTE) The Xpert Xpress SARS-CoV-2/FLU/RSV plus assay is intended as an aid in the diagnosis of influenza from Nasopharyngeal swab specimens and should not be used as a sole basis for treatment. Nasal washings and aspirates are unacceptable for Xpert Xpress SARS-CoV-2/FLU/RSV testing.  Fact Sheet for Patients: BloggerCourse.com  Fact Sheet for Healthcare Providers: SeriousBroker.it  This test is not yet approved or cleared by the [place] FDA and has been authorized for detection and/or diagnosis of SARS-CoV-2 by FDA under an Emergency Use Authorization (EUA). This EUA will remain in effect (meaning this test can be used) for the duration of the COVID-19 declaration under Section 564(b)(1) of the Act, 21 U.S.C. section 360bbb-3(b)(1), unless the authorization is terminated or revoked.  Performed at [facility], [address] [place], [place] [address]      Radiology Studies: No results found.  Scheduled Meds:  atorvastatin  40 mg Oral Daily   chlordiazePOXIDE  5 mg Oral TID   enoxaparin (LOVENOX) injection  40 mg Subcutaneous Q24H   folic acid  1 mg Oral Daily   furosemide  20 mg Intravenous Daily   irbesartan  37.5 mg Oral Daily   metoprolol succinate  50 mg Oral  Daily   multivitamin with minerals  1 tablet Oral Daily   potassium chloride  40 mEq Oral BID   spironolactone  12.5 mg Oral Daily   thiamine  100 mg Oral Daily   Or   thiamine  100 mg Intravenous Daily   Continuous Infusions:  dextrose 5 % and 0.45% NaCl 40 mL/hr at 06/29/21 1736     LOS: 6 days   Shon Hale, MD [facility]  If 7PM-7AM, please contact night-coverage [email or web address]  06/30/2021, 5:14 PM

## 2021-06-30 NOTE — Progress Notes (Signed)
Pt son is at bedside. Pt is calm and drinking ensure. This nurse spoke with son Vaughan Basta  about pt current medical situation.

## 2021-07-01 DIAGNOSIS — F10931 Alcohol use, unspecified with withdrawal delirium: Secondary | ICD-10-CM | POA: Diagnosis not present

## 2021-07-01 DIAGNOSIS — I428 Other cardiomyopathies: Secondary | ICD-10-CM | POA: Diagnosis not present

## 2021-07-01 DIAGNOSIS — I5023 Acute on chronic systolic (congestive) heart failure: Secondary | ICD-10-CM | POA: Diagnosis not present

## 2021-07-01 DIAGNOSIS — I10 Essential (primary) hypertension: Secondary | ICD-10-CM | POA: Diagnosis not present

## 2021-07-01 LAB — GLUCOSE, CAPILLARY
Glucose-Capillary: 103 mg/dL — ABNORMAL HIGH (ref 70–99)
Glucose-Capillary: 129 mg/dL — ABNORMAL HIGH (ref 70–99)
Glucose-Capillary: 130 mg/dL — ABNORMAL HIGH (ref 70–99)
Glucose-Capillary: 142 mg/dL — ABNORMAL HIGH (ref 70–99)

## 2021-07-01 NOTE — Progress Notes (Signed)
PROGRESS NOTE    Daryl [name]  GPQ:[id number] DOB: 11-10-1960 DOA: 06/23/2021 PCP: Patient, No Pcp Per (Inactive)    Brief Narrative:  Daryl [name] is a 60 y.o. male with medical history significant of nonischemic cardiomyopathy with ejection fraction of 20%, hypertension, presents to the emergency room with complaints of shortness of breath.  Reports that he has noticed worsening lower extremity over the past 3 to 4 days.  He is also developed some shortness of breath, but is now also having orthopnea.  He woke up this morning feeling short of breath.  Denies any chest pain.  He has not been taking his Lasix at home.  Says he does not have any Lasix.  He was evaluated in emergency room where chest x-ray showed CHF.  BNP elevated.  He was noted to be tachypneic, although still on room air.  He received intravenous Lasix with good urine output.  Still has evidence of volume overload.  Admission requested for further diuresis.   Assessment & Plan:   Active Problems:   Alcohol withdrawal delirium (HCC)   Nonischemic cardiomyopathy (HCC)   Essential hypertension   Acute on chronic systolic CHF (congestive heart failure) (HCC)   A/p 1)HFrEF/Acute on chronic systolic congestive heart failure -Echocardiogram with EF of 20 to 25% -Suspect decompensation related to medication noncompliance and ongoing ETOH abuse -It appears that he has a poor understanding of his underlying disease process as well as his overall medication regimen -Restarted on BB, ARB (currently on Avapro, Toprol-XL) --Continue iv Lasix and Aldactone with potassium supplementation -Patient still refusing or unable to cooperate with voiding into urinal so urine output is inadequate -Dr. Kerry Hough previously discussed with Dr. Diona Browner, -Needs to follow-up with general cardiology and advanced heart failure clinic on discharge -Repeat chest x-ray on 06/28/2021 showed resolution of pulmonary edema and resolution of CHF -c/n  IV Lasix  20 mg daily, continue Aldactone with potassium supplementation   2)Hypertension -See cardiovascular medications as above #1   3)Alcohol Abuse with DTs/acute metabolic encephalopathy -Patient with significant alcohol withdrawal symptoms requiring frequent doses of IV lorazepam -c/n  lorazepam per CIWA protocol, thiamine and folic acid as ordered -c/n schedule Librium 5 mg 3 times daily -Unable to use IV Precedex due to low EF -CT head pending  4)NSVT -Having episodes of NSVT on telemetry -electrolytes replaced -Dr. Kerry Hough previously discussed with Dr. Diona Browner   -no indication for defibrillator eval since his EF could potentially improve with medication compliance -will need cardiology follow up post discharge  5)Social/Ethics--multiple calls/attempts to reach patient's mother-- -Discussed with Pt's son Vaughan Basta at bedside on 06/30/21  Discussed with Brother Orion Modest at bedside on 07/01/21  DVT prophylaxis: enoxaparin (LOVENOX) injection 40 mg Start: 06/23/21 0945  Code Status: Full code Family Communication:  Discussed with Brother Orion Modest at bedside on 07/01/21 --Discussed with Pt's son Vaughan Basta at bedside on 06/30/21  --multiple calls/attempts to reach patient's mother-- Disposition Plan: Status is: Inpatient  Remains inpatient appropriate because: Needs continued iv diuresis and IV lorazepam for DTs -Unable to discharge home at this time due to persistent DT symptoms requiring IV Ativan  Consultants:  Curbside consultation with cardiology Procedures:  Echo: EF of 20%  Antimicrobials:     Subjective: -DT symptoms persist, requiring one-to-one sitter, requiring IV lorazepam and IM Haldol  --oral intake unreliable--- still needs IV fluids Discussed with Brother Orion Modest at bedside on 07/01/21  Objective: Vitals:   06/30/21 2127 07/01/21 0222 07/01/21 0500 07/01/21 0940  BP: 136/90 128/76  122/77  Pulse: 94 83  (!) 55  Resp: 20 18  18   Temp: 97.8 F (36.6  C) 97.7 F (36.5 C)  (!) 97.5 F (36.4 C)  TempSrc: Oral Axillary  Oral  SpO2: 96% 100%  97%  Weight:   69.4 kg   Height:        Intake/Output Summary (Last 24 hours) at 07/01/2021 1256 Last data filed at 07/01/2021 0800 Gross per 24 hour  Intake 236 ml  Output 700 ml  Net -464 ml   Filed Weights   06/30/21 0522 06/30/21 0545 07/01/21 0500  Weight: 69.2 kg 69 kg 69.4 kg   Physical Exam  Gen:- Awake, restless and agitated from time to time,  HEENT:- [place].AT, No sclera icterus Neck-Supple Neck,No JVD,.  Lungs-  CTAB , fair air movement bilaterally  CV- S1, S2 normal, RRR Abd-  +ve B.Sounds, Abd Soft, No tenderness,    Extremity/Skin:- No  edema,   good pedal pulses  Psych-affect is anxious, restless, agitated, very poor judgment and very poor insight Neuro-generalized weakness, no new focal deficits, +ve tremors .  Data Reviewed: I have personally reviewed following labs and imaging studies  CBC: Recent Labs  Lab 06/26/21 0524 06/28/21 0450 06/30/21 0332  WBC 6.5 8.3 9.0  HGB 15.5 17.6* 17.8*  HCT 48.6 54.9* 56.2*  MCV 86.6 86.2 87.0  PLT 326 408* 365   Basic Metabolic Panel: Recent Labs  Lab 06/25/21 1359 06/26/21 0524 06/28/21 0450 06/30/21 0332  NA 138 139 138 139  K 4.3 4.1 3.4* 3.9  CL 104 105 104 104  CO2 27 25 23 23   GLUCOSE 111* 95 117* 107*  BUN 18 15 15 14   CREATININE 1.33* 1.10 1.09 1.10  CALCIUM 8.7* 8.8* 9.2 9.4  MG  --  2.0  --   --   PHOS  --   --   --  3.3   GFR: Estimated Creatinine Clearance: 71 mL/min (by C-G formula based on SCr of 1.1 mg/dL). Liver Function Tests: Recent Labs  Lab 06/30/21 0332  ALBUMIN 3.8   No results for input(s): LIPASE, AMYLASE in the last 168 hours.  No results for input(s): AMMONIA in the last 168 hours. Coagulation Profile: No results for input(s): INR, PROTIME in the last 168 hours. Cardiac Enzymes: No results for input(s): CKTOTAL, CKMB, CKMBINDEX, TROPONINI in the last 168 hours. BNP (last 3  results) No results for input(s): PROBNP in the last 8760 hours. HbA1C: No results for input(s): HGBA1C in the last 72 hours. CBG: Recent Labs  Lab 06/29/21 2331 06/30/21 0542 07/01/21 0023 07/01/21 0606 07/01/21 1115  GLUCAP 97 137* 103* 129* 142*   Lipid Profile: No results for input(s): CHOL, HDL, LDLCALC, TRIG, CHOLHDL, LDLDIRECT in the last 72 hours. Thyroid Function Tests: No results for input(s): TSH, T4TOTAL, FREET4, T3FREE, THYROIDAB in the last 72 hours. Anemia Panel: No results for input(s): VITAMINB12, FOLATE, FERRITIN, TIBC, IRON, RETICCTPCT in the last 72 hours. Sepsis Labs: No results for input(s): PROCALCITON, LATICACIDVEN in the last 168 hours.  Recent Results (from the past 240 hour(s))  Resp Panel by RT-PCR (Flu A&B, Covid) Nasopharyngeal Swab     Status: None   Collection Time: 06/23/21  6:40 AM   Specimen: Nasopharyngeal Swab; Nasopharyngeal(NP) swabs in vial transport medium  Result Value Ref Range Status   SARS Coronavirus 2 by RT PCR NEGATIVE NEGATIVE Final    Comment: (NOTE) SARS-CoV-2 target nucleic acids are NOT DETECTED.  The  SARS-CoV-2 RNA is generally detectable in upper respiratory specimens during the acute phase of infection. The lowest concentration of SARS-CoV-2 viral copies this assay can detect is 138 copies/mL. A negative result does not preclude SARS-Cov-2 infection and should not be used as the sole basis for treatment or other patient management decisions. A negative result may occur with  improper specimen collection/handling, submission of specimen other than nasopharyngeal swab, presence of viral mutation(s) within the areas targeted by this assay, and inadequate number of viral copies(<138 copies/mL). A negative result must be combined with clinical observations, patient history, and epidemiological information. The expected result is Negative.  Fact Sheet for Patients:  BloggerCourse.com  Fact Sheet  for Healthcare Providers:  SeriousBroker.it  This test is no t yet approved or cleared by the [place] FDA and  has been authorized for detection and/or diagnosis of SARS-CoV-2 by FDA under an Emergency Use Authorization (EUA). This EUA will remain  in effect (meaning this test can be used) for the duration of the COVID-19 declaration under Section 564(b)(1) of the Act, 21 U.S.C.section 360bbb-3(b)(1), unless the authorization is terminated  or revoked sooner.       Influenza A by PCR NEGATIVE NEGATIVE Final   Influenza B by PCR NEGATIVE NEGATIVE Final    Comment: (NOTE) The Xpert Xpress SARS-CoV-2/FLU/RSV plus assay is intended as an aid in the diagnosis of influenza from Nasopharyngeal swab specimens and should not be used as a sole basis for treatment. Nasal washings and aspirates are unacceptable for Xpert Xpress SARS-CoV-2/FLU/RSV testing.  Fact Sheet for Patients: BloggerCourse.com  Fact Sheet for Healthcare Providers: SeriousBroker.it  This test is not yet approved or cleared by the [place] FDA and has been authorized for detection and/or diagnosis of SARS-CoV-2 by FDA under an Emergency Use Authorization (EUA). This EUA will remain in effect (meaning this test can be used) for the duration of the COVID-19 declaration under Section 564(b)(1) of the Act, 21 U.S.C. section 360bbb-3(b)(1), unless the authorization is terminated or revoked.  Performed at [facility], [address], [place], [place] [address]      Radiology Studies: CT HEAD WO CONTRAST ( )  Result Date: 06/30/2021 CLINICAL DATA:  Mental status change, unknown cause.  Alcohol abuse. EXAM: CT HEAD WITHOUT CONTRAST TECHNIQUE: Contiguous axial images were obtained from the base of the skull through the vertex without intravenous contrast. COMPARISON:  Head MRI 01/16/2014 FINDINGS: Brain: There is no evidence of an acute  cortically based infarct, intracranial hemorrhage, mass, midline shift, or extra-axial fluid collection. Hypodensities in the cerebral white matter bilaterally are nonspecific but compatible with mild chronic small vessel ischemic disease. The ventricles and sulci are within normal limits for age. There is a 1 cm focus of asymmetric, mild hypoattenuation in the left cerebellar hemisphere, indeterminate for a small infarct of uncertain acuity. Vascular: No suspicious focal vascular hyperdensity. Skull: No acute fracture or suspicious osseous lesion. Sinuses/Orbits: Visualized paranasal sinuses and mastoid air cells are clear. Unremarkable orbits. Other: None. IMPRESSION: 1. No evidence of an acute supratentorial infarct or intracranial hemorrhage. 2. Possible small age indeterminate left cerebellar infarct. 3. Mild chronic small vessel ischemic disease. Electronically Signed   By: Sebastian Ache M.D.   On: 06/30/2021 19:23    Scheduled Meds:  atorvastatin  40 mg Oral Daily   chlordiazePOXIDE  5 mg Oral TID   enoxaparin (LOVENOX) injection  40 mg Subcutaneous Q24H   folic acid  1 mg Oral Daily   furosemide  20 mg Intravenous  Daily   irbesartan  37.5 mg Oral Daily   metoprolol succinate  50 mg Oral Daily   multivitamin with minerals  1 tablet Oral Daily   potassium chloride  40 mEq Oral BID   spironolactone  12.5 mg Oral Daily   thiamine  100 mg Oral Daily   Or   thiamine  100 mg Intravenous Daily   Continuous Infusions:  dextrose 5 % and 0.45% NaCl 40 mL/hr at 06/29/21 1736    LOS: 7 days   Shon Hale, MD [facility]  If 7PM-7AM, please contact night-coverage [email or web address]  07/01/2021, 12:56 PM

## 2021-07-02 DIAGNOSIS — I428 Other cardiomyopathies: Secondary | ICD-10-CM | POA: Diagnosis not present

## 2021-07-02 DIAGNOSIS — F10931 Alcohol use, unspecified with withdrawal delirium: Secondary | ICD-10-CM | POA: Diagnosis not present

## 2021-07-02 DIAGNOSIS — I10 Essential (primary) hypertension: Secondary | ICD-10-CM | POA: Diagnosis not present

## 2021-07-02 LAB — GLUCOSE, CAPILLARY
Glucose-Capillary: 128 mg/dL — ABNORMAL HIGH (ref 70–99)
Glucose-Capillary: 117 mg/dL — ABNORMAL HIGH (ref 70–99)

## 2021-07-02 MED ORDER — CHLORDIAZEPOXIDE HCL 5 MG PO CAPS
10.0000 mg | ORAL_CAPSULE | Freq: Three times a day (TID) | ORAL | Status: AC
  Administered 2021-07-03 – 2021-07-04 (×2): 10 mg via ORAL
  Filled 2021-07-02 (×4): qty 2

## 2021-07-02 MED ORDER — LORAZEPAM 2 MG/ML IJ SOLN
1.0000 mg | INTRAMUSCULAR | Status: DC | PRN
  Administered 2021-07-02 – 2021-07-03 (×3): 2 mg via INTRAVENOUS
  Filled 2021-07-02 (×3): qty 1

## 2021-07-02 MED ORDER — LORAZEPAM 1 MG PO TABS
1.0000 mg | ORAL_TABLET | ORAL | Status: AC | PRN
  Administered 2021-07-04: 2 mg via ORAL
  Filled 2021-07-02 (×2): qty 2

## 2021-07-02 NOTE — Progress Notes (Signed)
PROGRESS NOTE    Daryl [name]  MWU:[id number] DOB: August 25, 1961 DOA: 06/23/2021 PCP: Patient, No Pcp Per (Inactive)    Brief Narrative:  Daryl [name] is a 60 y.o. male with medical history significant of nonischemic cardiomyopathy with ejection fraction of 20%, hypertension, presents to the emergency room with complaints of shortness of breath.  Reports that he has noticed worsening lower extremity over the past 3 to 4 days.  He is also developed some shortness of breath, but is now also having orthopnea.  He woke up this morning feeling short of breath.  Denies any chest pain.  He has not been taking his Lasix at home.  Says he does not have any Lasix.  He was evaluated in emergency room where chest x-ray showed CHF.  BNP elevated.  He was noted to be tachypneic, although still on room air.  He received intravenous Lasix with good urine output.  Still has evidence of volume overload.  Admission requested for further diuresis.   Assessment & Plan:   Active Problems:   Alcohol withdrawal delirium (HCC)   Nonischemic cardiomyopathy (HCC)   Essential hypertension   Acute on chronic systolic CHF (congestive heart failure) (HCC)   A/p 1)HFrEF/Acute on chronic systolic congestive heart failure -Echocardiogram with EF of 20 to 25% -Suspect decompensation related to medication noncompliance and ongoing ETOH abuse -It appears that he has a poor understanding of his underlying disease process as well as his overall medication regimen -Restarted on BB, ARB (currently on Avapro, Toprol-XL) --Continue iv Lasix and Aldactone with potassium supplementation -Patient still refusing or unable to cooperate with voiding into urinal so urine output is inadequate -Dr. Kerry Hough previously discussed with Dr. Diona Browner, -Needs to follow-up with general cardiology and advanced heart failure clinic on discharge -Repeat chest x-ray on 06/28/2021 showed resolution of pulmonary edema and resolution of CHF -c/n  IV Lasix  20 mg daily, continue Aldactone with potassium supplementation   2)Hypertension -See cardiovascular medications as above #1   3)Alcohol Abuse with DTs/acute metabolic encephalopathy -Patient had severe alcohol withdrawal symptoms requiring frequent doses of IV lorazepam -c/n  lorazepam per CIWA protocol, thiamine and folic acid as ordered -c/n schedule Librium 3 times daily -Reluctant to use IV Precedex due to low EF -CT head without acute findings -Overall DT symptoms improving significantly over the last 24 hrs or so  4)NSVT -Having episodes of NSVT on telemetry -electrolytes replaced -Dr. Kerry Hough previously discussed with Dr. Diona Browner   -no indication for defibrillator eval since his EF could potentially improve with medication compliance -will need cardiology follow up post discharge  5)Social/Ethics--multiple calls/attempts to reach patient's mother-- -Discussed with Pt's son Vaughan Basta at bedside on 06/30/21 and 07/02/21 Discussed with Brother Orion Modest at bedside on 07/01/21  DVT prophylaxis: enoxaparin (LOVENOX) injection 40 mg Start: 06/23/21 0945  Code Status: Full code Family Communication:  -Discussed with Pt's son Jermaine at bedside on 06/30/21 and 07/02/21 Discussed with Brother Orion Modest at bedside on 07/01/21 --multiple calls/attempts to reach patient's mother--  Disposition Plan: Status is: Inpatient  Remains inpatient appropriate because: Needs continued iv diuresis and IV lorazepam for DTs -Unable to discharge home at this time due to persistent DT symptoms requiring IV Ativan -Anticipate discharge home over the next 48 hours if DT symptoms continue to improve  Consultants:  Curbside consultation with cardiology Procedures:  Echo: EF of 20%  Antimicrobials:     Subjective: -DT symptoms starting to improve some, still requiring one-to-one sitter, requiring IV lorazepam and  IM Haldol  -Oral intake is not great----still getting some IV  dextrose -  Objective: Vitals:   07/01/21 2128 07/02/21 0456 07/02/21 0600 07/02/21 0902  BP: 134/88  (!) 127/91 111/88  Pulse: 85  81 95  Resp: 18  18   Temp: 97.7 F (36.5 C)  97.7 F (36.5 C)   TempSrc: Oral  Oral   SpO2: 99%  100%   Weight:  70.9 kg    Height:        Intake/Output Summary (Last 24 hours) at 07/02/2021 1100 Last data filed at 07/02/2021 0900 Gross per 24 hour  Intake 3560.78 ml  Output 1300 ml  Net 2260.78 ml   Filed Weights   06/30/21 0545 07/01/21 0500 07/02/21 0456  Weight: 69 kg 69.4 kg 70.9 kg   Physical Exam  Gen:- Awake, less agitated, more cooperative HEENT:- [place].AT, No sclera icterus Neck-Supple Neck,No JVD,.  Lungs-  CTAB , fair air movement bilaterally  CV- S1, S2 normal, RRR Abd-  +ve B.Sounds, Abd Soft, No tenderness,    Extremity/Skin:- No  edema,   good pedal pulses  Psych-affect is anxious, restless, less agitated, very poor judgment and very poor insight Neuro-generalized weakness, no new focal deficits, +ve tremors .  Data Reviewed: I have personally reviewed following labs and imaging studies  CBC: Recent Labs  Lab 06/26/21 0524 06/28/21 0450 06/30/21 0332  WBC 6.5 8.3 9.0  HGB 15.5 17.6* 17.8*  HCT 48.6 54.9* 56.2*  MCV 86.6 86.2 87.0  PLT 326 408* 365   Basic Metabolic Panel: Recent Labs  Lab 06/25/21 1359 06/26/21 0524 06/28/21 0450 06/30/21 0332  NA 138 139 138 139  K 4.3 4.1 3.4* 3.9  CL 104 105 104 104  CO2 27 25 23 23   GLUCOSE 111* 95 117* 107*  BUN 18 15 15 14   CREATININE 1.33* 1.10 1.09 1.10  CALCIUM 8.7* 8.8* 9.2 9.4  MG  --  2.0  --   --   PHOS  --   --   --  3.3   GFR: Estimated Creatinine Clearance: 72.3 mL/min (by C-G formula based on SCr of 1.1 mg/dL). Liver Function Tests: Recent Labs  Lab 06/30/21 0332  ALBUMIN 3.8   No results for input(s): LIPASE, AMYLASE in the last 168 hours.  No results for input(s): AMMONIA in the last 168 hours. Coagulation Profile: No results for input(s):  INR, PROTIME in the last 168 hours. Cardiac Enzymes: No results for input(s): CKTOTAL, CKMB, CKMBINDEX, TROPONINI in the last 168 hours. BNP (last 3 results) No results for input(s): PROBNP in the last 8760 hours. HbA1C: No results for input(s): HGBA1C in the last 72 hours. CBG: Recent Labs  Lab 06/30/21 0542 07/01/21 0023 07/01/21 0606 07/01/21 1115 07/01/21 1625  GLUCAP 137* 103* 129* 142* 130*   Lipid Profile: No results for input(s): CHOL, HDL, LDLCALC, TRIG, CHOLHDL, LDLDIRECT in the last 72 hours. Thyroid Function Tests: No results for input(s): TSH, T4TOTAL, FREET4, T3FREE, THYROIDAB in the last 72 hours. Anemia Panel: No results for input(s): VITAMINB12, FOLATE, FERRITIN, TIBC, IRON, RETICCTPCT in the last 72 hours. Sepsis Labs: No results for input(s): PROCALCITON, LATICACIDVEN in the last 168 hours.  Recent Results (from the past 240 hour(s))  Resp Panel by RT-PCR (Flu A&B, Covid) Nasopharyngeal Swab     Status: None   Collection Time: 06/23/21  6:40 AM   Specimen: Nasopharyngeal Swab; Nasopharyngeal(NP) swabs in vial transport medium  Result Value Ref Range Status   SARS Coronavirus 2  by RT PCR NEGATIVE NEGATIVE Final    Comment: (NOTE) SARS-CoV-2 target nucleic acids are NOT DETECTED.  The SARS-CoV-2 RNA is generally detectable in upper respiratory specimens during the acute phase of infection. The lowest concentration of SARS-CoV-2 viral copies this assay can detect is 138 copies/mL. A negative result does not preclude SARS-Cov-2 infection and should not be used as the sole basis for treatment or other patient management decisions. A negative result may occur with  improper specimen collection/handling, submission of specimen other than nasopharyngeal swab, presence of viral mutation(s) within the areas targeted by this assay, and inadequate number of viral copies(<138 copies/mL). A negative result must be combined with clinical observations, patient history,  and epidemiological information. The expected result is Negative.  Fact Sheet for Patients:  BloggerCourse.com  Fact Sheet for Healthcare Providers:  SeriousBroker.it  This test is no t yet approved or cleared by the [place] FDA and  has been authorized for detection and/or diagnosis of SARS-CoV-2 by FDA under an Emergency Use Authorization (EUA). This EUA will remain  in effect (meaning this test can be used) for the duration of the COVID-19 declaration under Section 564(b)(1) of the Act, 21 U.S.C.section 360bbb-3(b)(1), unless the authorization is terminated  or revoked sooner.       Influenza A by PCR NEGATIVE NEGATIVE Final   Influenza B by PCR NEGATIVE NEGATIVE Final    Comment: (NOTE) The Xpert Xpress SARS-CoV-2/FLU/RSV plus assay is intended as an aid in the diagnosis of influenza from Nasopharyngeal swab specimens and should not be used as a sole basis for treatment. Nasal washings and aspirates are unacceptable for Xpert Xpress SARS-CoV-2/FLU/RSV testing.  Fact Sheet for Patients: BloggerCourse.com  Fact Sheet for Healthcare Providers: SeriousBroker.it  This test is not yet approved or cleared by the [place] FDA and has been authorized for detection and/or diagnosis of SARS-CoV-2 by FDA under an Emergency Use Authorization (EUA). This EUA will remain in effect (meaning this test can be used) for the duration of the COVID-19 declaration under Section 564(b)(1) of the Act, 21 U.S.C. section 360bbb-3(b)(1), unless the authorization is terminated or revoked.  Performed at [facility], [address], [place], [place] [address]      Radiology Studies: CT HEAD WO CONTRAST ( )  Result Date: 06/30/2021 CLINICAL DATA:  Mental status change, unknown cause.  Alcohol abuse. EXAM: CT HEAD WITHOUT CONTRAST TECHNIQUE: Contiguous axial images were obtained from the  base of the skull through the vertex without intravenous contrast. COMPARISON:  Head MRI 01/16/2014 FINDINGS: Brain: There is no evidence of an acute cortically based infarct, intracranial hemorrhage, mass, midline shift, or extra-axial fluid collection. Hypodensities in the cerebral white matter bilaterally are nonspecific but compatible with mild chronic small vessel ischemic disease. The ventricles and sulci are within normal limits for age. There is a 1 cm focus of asymmetric, mild hypoattenuation in the left cerebellar hemisphere, indeterminate for a small infarct of uncertain acuity. Vascular: No suspicious focal vascular hyperdensity. Skull: No acute fracture or suspicious osseous lesion. Sinuses/Orbits: Visualized paranasal sinuses and mastoid air cells are clear. Unremarkable orbits. Other: None. IMPRESSION: 1. No evidence of an acute supratentorial infarct or intracranial hemorrhage. 2. Possible small age indeterminate left cerebellar infarct. 3. Mild chronic small vessel ischemic disease. Electronically Signed   By: Sebastian Ache M.D.   On: 06/30/2021 19:23    Scheduled Meds:  atorvastatin  40 mg Oral Daily   chlordiazePOXIDE  10 mg Oral TID   enoxaparin (LOVENOX) injection  40 mg Subcutaneous Q24H   folic acid  1 mg Oral Daily   furosemide  20 mg Intravenous Daily   irbesartan  37.5 mg Oral Daily   metoprolol succinate  50 mg Oral Daily   multivitamin with minerals  1 tablet Oral Daily   potassium chloride  40 mEq Oral BID   spironolactone  12.5 mg Oral Daily   thiamine  100 mg Oral Daily   Or   thiamine  100 mg Intravenous Daily   Continuous Infusions:  dextrose 5 % and 0.45% NaCl 75 mL/hr at 07/02/21 0713    LOS: 8 days   Shon Hale, MD [facility]  If 7PM-7AM, please contact night-coverage [email or web address]  07/02/2021, 11:00 AM

## 2021-07-03 DIAGNOSIS — I10 Essential (primary) hypertension: Secondary | ICD-10-CM | POA: Diagnosis not present

## 2021-07-03 DIAGNOSIS — F10931 Alcohol use, unspecified with withdrawal delirium: Secondary | ICD-10-CM | POA: Diagnosis not present

## 2021-07-03 DIAGNOSIS — I5023 Acute on chronic systolic (congestive) heart failure: Secondary | ICD-10-CM | POA: Diagnosis not present

## 2021-07-03 DIAGNOSIS — I428 Other cardiomyopathies: Secondary | ICD-10-CM | POA: Diagnosis not present

## 2021-07-03 NOTE — Progress Notes (Signed)
PROGRESS NOTE    Daryl [name]  [id number] DOB: Jan 09, 1961 DOA: 06/23/2021 PCP: Patient, No Pcp Per (Inactive)    Brief Narrative:  Daryl [name] is a 60 y.o. male with medical history significant of nonischemic cardiomyopathy with ejection fraction of 20%, hypertension, presents to the emergency room with complaints of shortness of breath.  Reports that he has noticed worsening lower extremity over the past 3 to 4 days.  He is also developed some shortness of breath, but is now also having orthopnea.  He woke up this morning feeling short of breath.  Denies any chest pain.  He has not been taking his Lasix at home.  Says he does not have any Lasix.  He was evaluated in emergency room where chest x-ray showed CHF.  BNP elevated.  He was noted to be tachypneic, although still on room air.  He received intravenous Lasix with good urine output.  Still has evidence of volume overload.  Admission requested for further diuresis.   Assessment & Plan:   Active Problems:   Nonischemic cardiomyopathy (HCC)   Essential hypertension   Acute on chronic systolic CHF (congestive heart failure) (HCC)   Alcohol withdrawal delirium (HCC)   A/p 1)HFrEF/Acute on chronic systolic congestive heart failure -Echocardiogram with EF of 20 to 25% -Suspect decompensation related to medication noncompliance and ongoing ETOH abuse -It appears that he has a poor understanding of his underlying disease process as well as his overall medication regimen -Restarted on BB, ARB (currently on Avapro, Toprol-XL) --Continue iv Lasix and Aldactone with potassium supplementation -Patient still refusing or unable to cooperate with voiding into urinal so urine output is inadequate -Dr. Roderic Palau previously discussed with Dr. Domenic Polite, -Needs to follow-up with general cardiology and advanced heart failure clinic on discharge -Repeat chest x-ray on 06/28/2021 showed resolution of pulmonary edema and resolution of CHF -Continue Lasix  (will transition to oral route), continue Aldactone with potassium supplementation   2)Hypertension -See cardiovascular medications as above #1   3)Alcohol Abuse with DTs/acute metabolic encephalopathy -Patient had severe alcohol withdrawal symptoms requiring frequent doses of IV lorazepam -c/n  lorazepam per CIWA protocol, thiamine and folic acid as ordered -Continue chedule Librium 3 times daily -Reluctant to use IV Precedex due to low EF. -CT head without acute findings -Overall DT symptoms are improving; but is still not resolved.  4)NSVT -Having episodes of NSVT on telemetry -electrolytes replaced -Dr. Roderic Palau previously discussed with Dr. Domenic Polite   -no indication for defibrillator eval since his EF could potentially improve with medication compliance -will need cardiology follow up post discharge  5)Social/Ethics--with high risk for future readmissions in the setting of medication noncompliance and ongoing alcohol use.  DVT prophylaxis: enoxaparin (LOVENOX) injection 40 mg Start: 06/23/21 0945  Code Status: Full code Family Communication:  -Patient's son and brother were updated last on 07/02/2021; no family members able to be reached today.  Disposition Plan: Status is: Inpatient  Remains inpatient appropriate because: Needs continued iv diuresis and IV lorazepam for DTs -Unable to discharge home at this time due to persistent DT symptoms requiring IV Ativan -Anticipate discharge home over the next 48 hours if DT symptoms continue to improve  Consultants:  Curbside consultation with cardiology Procedures:  Echo: EF of 20%  Antimicrobials:     Subjective: Tremulous on exam; oriented x1.  Less agitated and more cooperative.  No chest pain, no nausea, no vomiting, reports no shortness of breath currently.  Objective: Vitals:   07/03/21 0404 07/03/21 [id number] 07/03/21  0809 07/03/21 1257  BP:  102/79 109/87 90/72  Pulse:  92 93 91  Resp:  18 20 20   Temp:  98.8 F (37.1  C) 97.6 F (36.4 C) 98 F (36.7 C)  TempSrc:  Oral Oral   SpO2:  96% 100% 98%  Weight: 73.1 kg     Height:        Intake/Output Summary (Last 24 hours) at 07/03/2021 1806 Last data filed at 07/03/2021 1518 Gross per 24 hour  Intake 1360.09 ml  Output 900 ml  Net 460.09 ml   Filed Weights   07/01/21 0500 07/02/21 0456 07/03/21 0404  Weight: 69.4 kg 70.9 kg 73.1 kg   Physical Exam General exam: Alert, awake, oriented x 1; following simple commands; disoriented to time and place.  Denies chest pain, no nausea, no vomiting, currently without shortness of breath.  Less agitated and more cooperative. Respiratory system: Improved air movement bilaterally; no frank crackles, no wheezing, no using accessory muscle.  Good saturation on room air. Cardiovascular system:RRR. No murmurs, rubs, gallops.  No JVD on exam. Gastrointestinal system: Abdomen is nondistended, soft and nontender. No organomegaly or masses felt. Normal bowel sounds heard. Central nervous system: No focal neurological deficits.  Positive tremors Extremities: No cyanosis or clubbing; no edema on exam. Skin: No petechiae. Psychiatry: Judgement and insight appear impaired in the setting of alcohol withdrawal/encephalopathy.  .  Data Reviewed: I have personally reviewed following labs and imaging studies  CBC: Recent Labs  Lab 06/28/21 0450 06/30/21 0332  WBC 8.3 9.0  HGB 17.6* 17.8*  HCT 54.9* 56.2*  MCV 86.2 87.0  PLT 408* 365   Basic Metabolic Panel: Recent Labs  Lab 06/28/21 0450 06/30/21 0332  NA 138 139  K 3.4* 3.9  CL 104 104  CO2 23 23  GLUCOSE 117* 107*  BUN 15 14  CREATININE 1.09 1.10  CALCIUM 9.2 9.4  PHOS  --  3.3   GFR: Estimated Creatinine Clearance: 72.3 mL/min (by C-G formula based on SCr of 1.1 mg/dL).  Liver Function Tests: Recent Labs  Lab 06/30/21 0332  ALBUMIN 3.8   CBG: Recent Labs  Lab 07/01/21 0606 07/01/21 1115 07/01/21 1625 07/02/21 1124 07/02/21 1626  GLUCAP  129* 142* 130* 128* 117*   Radiology Studies: No results found.  Scheduled Meds:  atorvastatin  40 mg Oral Daily   chlordiazePOXIDE  10 mg Oral TID   enoxaparin (LOVENOX) injection  40 mg Subcutaneous Q24H   folic acid  1 mg Oral Daily   furosemide  20 mg Intravenous Daily   irbesartan  37.5 mg Oral Daily   metoprolol succinate  50 mg Oral Daily   multivitamin with minerals  1 tablet Oral Daily   potassium chloride  40 mEq Oral BID   spironolactone  12.5 mg Oral Daily   thiamine  100 mg Oral Daily   Or   thiamine  100 mg Intravenous Daily   Continuous Infusions:  dextrose 5 % and 0.45% NaCl 30 mL/hr at 07/03/21 1518    LOS: 9 days   13/09/22, MD [facility]  If 7PM-7AM, please contact night-coverage [email or web address]  07/03/2021, 6:06 PM

## 2021-07-04 DIAGNOSIS — I10 Essential (primary) hypertension: Secondary | ICD-10-CM | POA: Diagnosis not present

## 2021-07-04 DIAGNOSIS — F10931 Alcohol use, unspecified with withdrawal delirium: Secondary | ICD-10-CM | POA: Diagnosis not present

## 2021-07-04 DIAGNOSIS — I428 Other cardiomyopathies: Secondary | ICD-10-CM | POA: Diagnosis not present

## 2021-07-04 DIAGNOSIS — I5023 Acute on chronic systolic (congestive) heart failure: Secondary | ICD-10-CM | POA: Diagnosis not present

## 2021-07-04 LAB — GLUCOSE, CAPILLARY
Glucose-Capillary: 169 mg/dL — ABNORMAL HIGH (ref 70–99)
Glucose-Capillary: 121 mg/dL — ABNORMAL HIGH (ref 70–99)
Glucose-Capillary: 134 mg/dL — ABNORMAL HIGH (ref 70–99)

## 2021-07-04 MED ORDER — CHLORDIAZEPOXIDE HCL 5 MG PO CAPS
15.0000 mg | ORAL_CAPSULE | Freq: Three times a day (TID) | ORAL | Status: DC
  Administered 2021-07-05: 15 mg via ORAL
  Filled 2021-07-04 (×4): qty 3

## 2021-07-04 MED ORDER — THIAMINE HCL 100 MG PO TABS
100.0000 mg | ORAL_TABLET | Freq: Two times a day (BID) | ORAL | Status: DC
  Administered 2021-07-05: 100 mg via ORAL
  Filled 2021-07-04 (×2): qty 1

## 2021-07-04 MED ORDER — FUROSEMIDE 40 MG PO TABS
40.0000 mg | ORAL_TABLET | Freq: Every day | ORAL | Status: DC
  Administered 2021-07-05: 40 mg via ORAL
  Filled 2021-07-04 (×2): qty 1

## 2021-07-04 NOTE — Progress Notes (Signed)
07/04/2021 0545:  Safety sitter observes pt pulling at soft wrist restraint and pt was able to break free from left restraint. Pt then stood from bed, and became combative with safety sitter when attempting to redirect to bed. Safety sitter alerted staff, this RN and charge RN at bedside. Pt uncooperative regarding safety precautions. Pt self ambulates to bathroom saying, "get the fuck off of me." Charge RN alerts security as Recruitment consultant and this RN directs pt to chair in room. Security arrives and assists with placing pt in bed, restraint application, and soft mitt application. Pt also pulled PIV from RUA. This is the second IV pulled by patient during this RN's shift. This RN will alert MD on call.

## 2021-07-04 NOTE — Progress Notes (Signed)
PROGRESS NOTE    RORAN WEGNER  [id number] DOB: [birth year] DOA: 06/23/2021 PCP: Patient, No Pcp Per (Inactive)    Brief Narrative:  Daryl [name] is a 60 y.o. male with medical history significant of nonischemic cardiomyopathy with ejection fraction of 20%, hypertension, presents to the emergency room with complaints of shortness of breath.  Reports that he has noticed worsening lower extremity over the past 3 to 4 days.  He is also developed some shortness of breath, but is now also having orthopnea.  He woke up this morning feeling short of breath.  Denies any chest pain.  He has not been taking his Lasix at home.  Says he does not have any Lasix.  He was evaluated in emergency room where chest x-ray showed CHF.  BNP elevated.  He was noted to be tachypneic, although still on room air.  He received intravenous Lasix with good urine output.  Still has evidence of volume overload.  Admission requested for further diuresis.   Assessment & Plan:   Active Problems:   Nonischemic cardiomyopathy (HCC)   Essential hypertension   Acute on chronic systolic CHF (congestive heart failure) (HCC)   Alcohol withdrawal delirium (HCC)   A/p 1)HFrEF/Acute on chronic systolic congestive heart failure -Echocardiogram with EF of 20 to 25% -Suspect decompensation related to medication noncompliance and ongoing ETOH abuse -It appears that he has a poor understanding of his underlying disease process as well as his overall medication regimen -Restarted on BB, ARB (currently on Avapro, Toprol-XL) --Continue iv Lasix and Aldactone with potassium supplementation -Patient still refusing or unable to cooperate with voiding into urinal so urine output is inadequate -Dr. Kerry Hough previously discussed with Dr. Diona Browner, -Needs to follow-up with general cardiology and advanced heart failure clinic on discharge -Repeat chest x-ray on 06/28/2021 showed resolution of pulmonary edema and resolution of CHF -Continue Lasix  (will transition to oral route), continue Aldactone with potassium supplementation   2)Hypertension -See cardiovascular medications as above #1   3)Alcohol Abuse with DTs/acute metabolic encephalopathy -Patient had severe alcohol withdrawal symptoms requiring frequent doses of IV lorazepam -c/n  lorazepam per CIWA protocol, thiamine and folic acid as ordered. -Continue chedule Librium 3 times daily; dose adjusted. -Reluctant to use IV Precedex due to low EF. -CT head without acute findings -Overall DT symptoms continue improving; but is still not  -remove restrains -continue increased dose of thiamine.   4)NSVT -Having episodes of NSVT on telemetry -electrolytes replaced -Dr. Kerry Hough previously discussed with Dr. Diona Browner   -no indication for defibrillator eval since his EF could potentially improve with medication compliance -will need cardiology follow up post discharge  5)Social/Ethics--with high risk for future readmissions in the setting of medication noncompliance and ongoing alcohol use.  DVT prophylaxis: enoxaparin (LOVENOX) injection 40 mg Start: 06/23/21 0945  Code Status: Full code Family Communication:  -no family at bedside  Disposition Plan: Status is: Inpatient  Remains inpatient appropriate because: Needs continued iv diuresis and IV lorazepam for DTs -Unable to discharge home at this time due to persistent DT symptoms requiring IV Ativan -Anticipate discharge home over the next 24 hours if DT symptoms continue to improve.  Consultants:  Curbside consultation with cardiology Procedures:  Echo: EF of 20%  Antimicrobials:   None   Subjective: Mentation improved, following commands and cooperative currently. No CP, no SOB, no nausea or vomiting.   Objective: Vitals:   07/03/21 1257 07/03/21 2145 07/04/21 0440 07/04/21 1339  BP: 90/72 (!) 85/66  (!) 121/108  Pulse: 91 100  84  Resp: 20 20  17   Temp: 98 F (36.7 C) 97.7 F (36.5 C)  (!) 97.4 F (36.3  C)  TempSrc:  Oral  Oral  SpO2: 98% 99%  100%  Weight:   70.9 kg   Height:        Intake/Output Summary (Last 24 hours) at 07/04/2021 1609 Last data filed at 07/04/2021 0900 Gross per 24 hour  Intake 768.01 ml  Output 875 ml  Net -106.99 ml   Filed Weights   07/02/21 0456 07/03/21 0404 07/04/21 0440  Weight: 70.9 kg 73.1 kg 70.9 kg   Physical Exam General exam: Alert, awake, oriented x 1; overnight with episodes of restlessness and agitation. Respiratory system: Clear to auscultation. No using accessory muscles. Cardiovascular system: RRR, no rubs, no gallops, no JVD. Gastrointestinal system: Abdomen is nondistended, soft and nontender. No organomegaly or masses felt. Normal bowel sounds heard. Central nervous system: No focal neurological deficits. Extremities: No cyanosis, no clubbing, no edema on exam.  Skin: No petechiae. Psychiatry: Judgement and insight impaired in the setting of alcohol withdrawal.     Data Reviewed: I have personally reviewed following labs and imaging studies  CBC: Recent Labs  Lab 06/28/21 0450 06/30/21 0332  WBC 8.3 9.0  HGB 17.6* 17.8*  HCT 54.9* 56.2*  MCV 86.2 87.0  PLT 408* [id number]   Basic Metabolic Panel: Recent Labs  Lab 06/28/21 0450 06/30/21 0332  NA 138 139  K 3.4* 3.9  CL 104 104  CO2 23 23  GLUCOSE 117* 107*  BUN 15 14  CREATININE 1.09 1.10  CALCIUM 9.2 9.4  PHOS  --  3.3   GFR: Estimated Creatinine Clearance: 72.3 mL/min (by C-G formula based on SCr of 1.1 mg/dL).  Liver Function Tests: Recent Labs  Lab 06/30/21 0332  ALBUMIN 3.8   CBG: Recent Labs  Lab 07/01/21 1115 07/01/21 1625 07/02/21 1124 07/02/21 1626 07/04/21 1113  GLUCAP 142* 130* 128* 117* 169*   Radiology Studies: No results found.  Scheduled Meds:  atorvastatin  40 mg Oral Daily   chlordiazePOXIDE  15 mg Oral TID   enoxaparin (LOVENOX) injection  40 mg Subcutaneous [id number]   folic acid  1 mg Oral Daily   furosemide  40 mg Oral Daily    irbesartan  37.5 mg Oral Daily   metoprolol succinate  50 mg Oral Daily   multivitamin with minerals  1 tablet Oral Daily   potassium chloride  40 mEq Oral BID   spironolactone  12.5 mg Oral Daily   thiamine  100 mg Oral BID   Continuous Infusions:    LOS: 10 days   Barton Dubois, MD [facility]  If 7PM-7AM, please contact night-coverage [email or web address]  07/04/2021, 4:09 PM

## 2021-07-04 NOTE — Progress Notes (Signed)
Patient refused Lipitor

## 2021-07-04 NOTE — Progress Notes (Signed)
Patient cooperative with safety sitter today , restraints removed. Compliant with morning medication andministration . Refused 1400 medications, patient however it is reported was up late , and he did not want to wake up to take them at this time.

## 2021-07-04 NOTE — Progress Notes (Signed)
07/04/1021 0200:  Safety sitter calls RN to bedside stating that pt is agitated and sitting up in bed. This RN redirects pt and assesses that pt is easily redirectable. About 15-20 mins later, safety sitter calls RN to bedside stating that pt is "kicking the foot of the bed." This RN finds that pt is easily redirectable. This RN sits with pt for 30 mins while safety sitter is on lunch break. Behavior is unremarkable and pt is compliant. Safety sitter returns and pt is resting. This RN will increase rounding to ensure pt safety.

## 2021-07-05 DIAGNOSIS — I428 Other cardiomyopathies: Secondary | ICD-10-CM | POA: Diagnosis not present

## 2021-07-05 DIAGNOSIS — I5023 Acute on chronic systolic (congestive) heart failure: Secondary | ICD-10-CM | POA: Diagnosis not present

## 2021-07-05 DIAGNOSIS — I10 Essential (primary) hypertension: Secondary | ICD-10-CM | POA: Diagnosis not present

## 2021-07-05 DIAGNOSIS — F10931 Alcohol use, unspecified with withdrawal delirium: Secondary | ICD-10-CM | POA: Diagnosis not present

## 2021-07-05 DIAGNOSIS — F10121 Alcohol abuse with intoxication delirium: Secondary | ICD-10-CM

## 2021-07-05 LAB — GLUCOSE, CAPILLARY
Glucose-Capillary: 113 mg/dL — ABNORMAL HIGH (ref 70–99)
Glucose-Capillary: 125 mg/dL — ABNORMAL HIGH (ref 70–99)

## 2021-07-05 MED ORDER — THIAMINE HCL 100 MG PO TABS: 100.0000 mg | ORAL_TABLET | Freq: Two times a day (BID) | ORAL | 2 refills | Status: DC

## 2021-07-05 MED ORDER — METOPROLOL SUCCINATE ER 50 MG PO TB24: 50.0000 mg | ORAL_TABLET | Freq: Every day | ORAL | 3 refills | Status: DC

## 2021-07-05 MED ORDER — CHLORDIAZEPOXIDE HCL 5 MG PO CAPS: ORAL_CAPSULE | ORAL | 0 refills | Status: DC

## 2021-07-05 MED ORDER — FUROSEMIDE 40 MG PO TABS: 40.0000 mg | ORAL_TABLET | Freq: Every day | ORAL | 3 refills | Status: DC

## 2021-07-05 MED ORDER — POTASSIUM CHLORIDE CRYS ER 20 MEQ PO TBCR: 40.0000 meq | EXTENDED_RELEASE_TABLET | Freq: Every day | ORAL | 1 refills | Status: DC

## 2021-07-05 MED ORDER — FOLIC ACID 1 MG PO TABS: 1.0000 mg | ORAL_TABLET | Freq: Every day | ORAL | 2 refills | Status: DC

## 2021-07-05 MED ORDER — SPIRONOLACTONE 25 MG PO TABS: 12.5000 mg | ORAL_TABLET | Freq: Every day | ORAL | 2 refills | Status: DC

## 2021-07-05 MED ORDER — IRBESARTAN 75 MG PO TABS: 37.5000 mg | ORAL_TABLET | Freq: Every day | ORAL | 3 refills | Status: DC

## 2021-07-05 NOTE — Progress Notes (Signed)
Spoke with patient's brother to let him know that he was being discharged and to see if he could come pick him up, stated he would come get him but wanted to talk to him first. Patient called and talked to his brother and stated that he could come but was in [facility] so would be around 6pm. Patient decided that he didn't want to wait. I offered to call another family member. Offered to check for other transportation available. Patient adament that he didn't want to wait and that he was ready to leave and would not wait for any other options.

## 2021-07-05 NOTE — Plan of Care (Signed)

## 2021-07-05 NOTE — Discharge Summary (Signed)
Physician Discharge Summary  Daryl [name] IEP:[id number] DOB: 1960/11/13 DOA: 06/23/2021  PCP: Patient, No Pcp Per (Inactive)  Admit date: 06/23/2021 Discharge date: 07/05/2021  Time spent: 35 minutes  Recommendations for Outpatient Follow-up:  Desitin patient with alcohol cessation Repeat basic metabolic panel to evaluate lites and renal function Close monitoring of patient volume status with further adjustment to heart failure medication for future optimization.  Discharge Diagnoses:  Nonischemic cardiomyopathy (HCC) Essential hypertension Acute on chronic systolic CHF (congestive heart failure) (HCC) Alcohol withdrawal delirium (HCC)   Discharge Condition: Stable and improved.  Discharged home with instruction to follow-up with PCP and cardiology service.  CODE STATUS: Full code.  Diet recommendation: Heart healthy/low-sodium diet.  Filed Weights   07/02/21 0456 07/03/21 0404 07/04/21 0440  Weight: 70.9 kg 73.1 kg 70.9 kg    History of present illness:  Daryl [name] is a 60 y.o. male with medical history significant of nonischemic cardiomyopathy with ejection fraction of 20%, hypertension, presents to the emergency room with complaints of shortness of breath.  Reports that he has noticed worsening lower extremity over the past 3 to 4 days.  He is also developed some shortness of breath, but is now also having orthopnea.  He woke up this morning feeling short of breath.  Denies any chest pain.  He has not been taking his Lasix at home.  Says he does not have any Lasix.  He was evaluated in emergency room where chest x-ray showed CHF.  BNP elevated.  He was noted to be tachypneic, although still on room air.  He received intravenous Lasix with good urine output.  Still has evidence of volume overload.  Admission requested for further diuresis.  Hospital Course:  1)HFrEF/Acute on chronic systolic congestive heart failure -Echocardiogram with EF of 20 to 25% -Suspect  decompensation related to medication noncompliance and ongoing ETOH abuse -It appears that he has a poor understanding of his underlying disease process as well as his overall medication regimen. -Continue the use of Avapro and extended release Toprol; patient will also use daily Lasix and Aldactone. -Continue daily potassium supplementation and outpatient follow-up with cardiology service. -Education provided about the importance of low-sodium diet and alcohol cessation. -Also receive education about the importance of daily weights and outpatient follow-up with cardiology service. --Home health services requested at time of discharge.   2)Hypertension -Stable overall; continue current adjusted antihypertensive regimen and heart healthy diet.   3)Alcohol Abuse with DTs/acute metabolic encephalopathy -Patient had severe alcohol withdrawal symptoms requiring the use of lorazepam following CIWA protocol along with Librium. -Cessation counseling provided -At discharge Librium tapering has been provided. -Resources and home health services has been requested to assist with safe transition of care. -Continue the use of folic acid and thiamine.   4)NSVT -Having episodes of NSVT on telemetry -electrolytes replaced; at discharge heart rate within normal limits. -Case discussed with Dr. Diona Browner (cardiology service); no indication for defibrillator eval since his EF could potentially improve with medication compliance and alcohol cessation. -will need cardiology follow up post discharge -Patient has been discharged on extended release metoprolol.   5)Social/Ethics--with high risk for future readmissions in the setting of medication noncompliance and ongoing alcohol use. -Discussed important with family members and home health services requested at discharge.  Procedures: Echo: Demonstrating 20% ejection fraction; global hypokinesis and no significant valvular disease.  Consultations: Cardiology  was curbside during admission.  Discharge Exam: Vitals:   07/05/21 0551 07/05/21 0850  BP: 92/71 (!) 122/55  Pulse:  89 84  Resp: 18   Temp: 97.8 F (36.6 C)   SpO2: 100%     General: Afebrile, no chest pain, no nausea, no vomiting, no shortness of breath.  Cooperative and following commands appropriately. Cardiovascular: Rate controlled, no rubs, no gallops, no JVD on exam. Respiratory: Good air movement bilaterally, no wheezing, no crackles, no using accessory muscles. Extremities: No cyanosis or clubbing.  Discharge Instructions   Discharge Instructions     (HEART FAILURE PATIENTS) Call MD:  Anytime you have any of the following symptoms: 1) 3 pound weight gain in 24 hours or 5 pounds in 1 week 2) shortness of breath, with or without a dry hacking cough 3) swelling in the hands, feet or stomach 4) if you have to sleep on extra pillows at night in order to breathe.   Complete by: As directed    Diet - low sodium heart healthy   Complete by: As directed    Discharge instructions   Complete by: As directed    Take medications as prescribed Maintain adequate hydration Follow low-sodium diet (less than 2 g daily) Follow-up with cardiology service and PCP after discharge. Check your weight on daily basis Stop alcohol consumption.      Allergies as of 07/05/2021       Reactions   Penicillins Swelling   Did it involve swelling of the face/tongue/throat, SOB, or low BP? Yes Did it involve sudden or severe rash/hives, skin peeling, or any reaction on the inside of your mouth or nose? No Did you need to seek medical attention at a hospital or doctor's office? Yes When did it last happen?    young adult   If all above answers are "NO", may proceed with cephalosporin use.        Medication List     STOP taking these medications    carvedilol 6.25 MG tablet Commonly known as: COREG   valsartan 40 MG tablet Commonly known as: Diovan Replaced by: irbesartan 75 MG  tablet       TAKE these medications    atorvastatin 40 MG tablet Commonly known as: LIPITOR TAKE 1 TABLET (40 MG TOTAL) BY MOUTH DAILY AT 6 PM.   chlordiazePOXIDE 5 MG capsule Commonly known as: LIBRIUM Take 3 tablets by mouth daily 3 times a day x1 day; then 2 tablets by mouth 3 times a day x2-day; then 1 tablet by mouth 3 times a day x2 days; then 1 tablet by mouth 2 times a day x2-day; then 1 tablet by mouth daily x3 days and stop Librium.   folic acid 1 MG tablet Commonly known as: FOLVITE Take 1 tablet (1 mg total) by mouth daily.   furosemide 40 MG tablet Commonly known as: LASIX Take 1 tablet (40 mg total) by mouth daily. What changed:  medication strength how much to take when to take this reasons to take this   irbesartan 75 MG tablet Commonly known as: AVAPRO Take 0.5 tablets (37.5 mg total) by mouth daily. Start taking on: July 06, 2021 Replaces: valsartan 40 MG tablet   metoprolol succinate 50 MG 24 hr tablet Commonly known as: TOPROL-XL Take 1 tablet (50 mg total) by mouth daily. Take with or immediately following a meal. Start taking on: July 06, 2021   potassium chloride SA 20 MEQ tablet Commonly known as: KLOR-CON Take 2 tablets (40 mEq total) by mouth daily.   spironolactone 25 MG tablet Commonly known as: ALDACTONE Take 0.5 tablets (12.5 mg total) by  mouth daily. Start taking on: July 06, 2021   thiamine 100 MG tablet Take 1 tablet (100 mg total) by mouth in the morning and at bedtime. What changed: when to take this       Allergies  Allergen Reactions   Penicillins Swelling    Did it involve swelling of the face/tongue/throat, SOB, or low BP? Yes Did it involve sudden or severe rash/hives, skin peeling, or any reaction on the inside of your mouth or nose? No Did you need to seek medical attention at a hospital or doctor's office? Yes When did it last happen?    young adult   If all above answers are "NO", may proceed with  cephalosporin use.     Follow-up Information     Minus Breeding, MD. Schedule an appointment as soon as possible for a visit in 10 day(s).   Specialty: Cardiology Contact information: [address] [place] [address] [phone]                 The results of significant diagnostics from this hospitalization (including imaging, microbiology, ancillary and laboratory) are listed below for reference.    Significant Diagnostic Studies: CT HEAD WO CONTRAST (5MM)  Result Date: 06/30/2021 CLINICAL DATA:  Mental status change, unknown cause.  Alcohol abuse. EXAM: CT HEAD WITHOUT CONTRAST TECHNIQUE: Contiguous axial images were obtained from the base of the skull through the vertex without intravenous contrast. COMPARISON:  Head MRI 01/16/2014 FINDINGS: Brain: There is no evidence of an acute cortically based infarct, intracranial hemorrhage, mass, midline shift, or extra-axial fluid collection. Hypodensities in the cerebral white matter bilaterally are nonspecific but compatible with mild chronic small vessel ischemic disease. The ventricles and sulci are within normal limits for age. There is a 1 cm focus of asymmetric, mild hypoattenuation in the left cerebellar hemisphere, indeterminate for a small infarct of uncertain acuity. Vascular: No suspicious focal vascular hyperdensity. Skull: No acute fracture or suspicious osseous lesion. Sinuses/Orbits: Visualized paranasal sinuses and mastoid air cells are clear. Unremarkable orbits. Other: None. IMPRESSION: 1. No evidence of an acute supratentorial infarct or intracranial hemorrhage. 2. Possible small age indeterminate left cerebellar infarct. 3. Mild chronic small vessel ischemic disease. Electronically Signed   By: Logan Bores M.D.   On: 06/30/2021 19:23   DG CHEST PORT 1 VIEW  Result Date: 06/28/2021 CLINICAL DATA:  Shortness of breath EXAM: PORTABLE CHEST 1 VIEW COMPARISON:  Previous studies including the examination of  06/23/2021 FINDINGS: Transverse diameter of heart is increased. There is interval clearing of pulmonary vascular congestion and pulmonary edema. There are no new focal infiltrates. There is no significant pleural effusion or pneumothorax. IMPRESSION: There is interval clearing of pulmonary vascular congestion and pulmonary edema. There is interval decrease in transverse diameter of heart. There are no new focal infiltrates. There is no pleural effusion or pneumothorax. Electronically Signed   By: Elmer Picker M.D.   On: 06/28/2021 16:15   DG Chest Port 1 View  Result Date: 06/23/2021 CLINICAL DATA:  Shortness of breath EXAM: PORTABLE CHEST 1 VIEW COMPARISON:  06/01/2021 FINDINGS: Interstitial opacity and small right pleural effusion. Chronic cardiomegaly. IMPRESSION: CHF. Electronically Signed   By: Jorje Guild M.D.   On: 06/23/2021 06:07   ECHOCARDIOGRAM COMPLETE  Result Date: 06/23/2021    ECHOCARDIOGRAM REPORT   Patient Name:   MAKAR VONGPHAKDY Date of Exam: 06/23/2021 Medical Rec #:  [id number]    Height:       69.0 in Accession #:  [id number]   Weight:       190.0 lb Date of Birth:  March 11, 1961    BSA:          2.021 m Patient Age:    82 years     BP:           173/142 mmHg Patient Gender: M            HR:           104 bpm. Exam Location:  [facility] Procedure: 2D Echo, Cardiac Doppler and Color Doppler Indications:    CHF- Acute Systolic 123456  History:        Patient has prior history of Echocardiogram examinations, most                 recent 09/13/2018. Cardiomyopathy, CAD; Risk                 Factors:Hypertension. Tobacco & ETOH abuse.  Sonographer:    Alvino Chapel RCS Referring Phys: [place]  1. Left ventricular ejection fraction, by estimation, is 20 to 25%. The left ventricle has severely decreased function. The left ventricle demonstrates global hypokinesis. The left ventricular internal cavity size was moderately dilated. Left ventricular diastolic  parameters are indeterminate.  2. Right ventricular systolic function is mildly reduced. The right ventricular size is mildly enlarged. There is severely elevated pulmonary artery systolic pressure.  3. Left atrial size was severely dilated.  4. Right atrial size was severely dilated.  5. The mitral valve is normal in structure. Severe mitral valve regurgitation. No evidence of mitral stenosis.  6. Tricuspid valve regurgitation is moderate to severe.  7. The aortic valve is tricuspid. Aortic valve regurgitation is not visualized. Mild aortic valve sclerosis is present, with no evidence of aortic valve stenosis.  8. The inferior vena cava is dilated in size with <50% respiratory variability, suggesting right atrial pressure of 15 mmHg. FINDINGS  Left Ventricle: Left ventricular ejection fraction, by estimation, is 20 to 25%. The left ventricle has severely decreased function. The left ventricle demonstrates global hypokinesis. The left ventricular internal cavity size was moderately dilated. There is no left ventricular hypertrophy. Left ventricular diastolic parameters are indeterminate. Right Ventricle: The right ventricular size is mildly enlarged. Right ventricular systolic function is mildly reduced. There is severely elevated pulmonary artery systolic pressure. The tricuspid regurgitant velocity is 3.79 m/s, and with an assumed right atrial pressure of 15 mmHg, the estimated right ventricular systolic pressure is [id number] mmHg. Left Atrium: Left atrial size was severely dilated. Right Atrium: Right atrial size was severely dilated. Pericardium: There is no evidence of pericardial effusion. Mitral Valve: The mitral valve is normal in structure. Severe mitral valve regurgitation. No evidence of mitral valve stenosis. Tricuspid Valve: The tricuspid valve is normal in structure. Tricuspid valve regurgitation is moderate to severe. No evidence of tricuspid stenosis. Aortic Valve: The aortic valve is tricuspid. Aortic  valve regurgitation is not visualized. Mild aortic valve sclerosis is present, with no evidence of aortic valve stenosis. Pulmonic Valve: The pulmonic valve was normal in structure. Pulmonic valve regurgitation is not visualized. No evidence of pulmonic stenosis. Aorta: The aortic root is normal in size and structure. Venous: The inferior vena cava is dilated in size with less than 50% respiratory variability, suggesting right atrial pressure of 15 mmHg. IAS/Shunts: No atrial level shunt detected by color flow Doppler.  LEFT VENTRICLE PLAX 2D LVIDd:         5.90 cm  Diastology LVIDs:         5.40 cm      LV e' lateral:   7.72 cm/s LV PW:         1.10 cm      LV E/e' lateral: 17.2 LV IVS:        0.90 cm LVOT diam:     1.90 cm LV SV:         36 LV SV Index:   18 LVOT Area:     2.84 cm  LV Volumes (MOD) LV vol d, MOD A2C: 127.0 ml LV vol d, MOD A4C: 145.0 ml LV vol s, MOD A2C: 112.0 ml LV vol s, MOD A4C: 102.0 ml LV SV MOD A2C:     15.0 ml LV SV MOD A4C:     145.0 ml LV SV MOD BP:      30.7 ml RIGHT VENTRICLE RV S prime:     12.40 cm/s TAPSE (M-mode): 2.8 cm LEFT ATRIUM              Index        RIGHT ATRIUM           Index LA diam:        5.40 cm  2.67 cm/m   RA Area:     30.00 cm LA Vol (A2C):   100.0 ml 49.47 ml/m  RA Volume:   115.00 ml 56.89 ml/m LA Vol (A4C):   128.0 ml 63.32 ml/m LA Biplane Vol: 116.0 ml 57.38 ml/m  AORTIC VALVE LVOT Vmax:   79.00 cm/s LVOT Vmean:  55.800 cm/s LVOT VTI:    0.127 m  AORTA Ao Root diam: 2.80 cm MITRAL VALVE                  TRICUSPID VALVE MV Area (PHT): 6.07 cm       TR Peak grad:   57.5 mmHg MV Decel Time: 125 msec       TR Vmax:        379.00 cm/s MR Peak grad:    97.2 mmHg MR Mean grad:    60.0 mmHg    SHUNTS MR Vmax:         493.00 cm/s  Systemic VTI:  0.13 m MR Vmean:        350.0 cm/s   Systemic Diam: 1.90 cm MR PISA:         1.57 cm MR PISA Eff ROA: 8 mm MR PISA Radius:  0.50 cm MV E velocity: 133.00 cm/s MV A velocity: 47.10 cm/s MV E/A ratio:  2.82 Kirk Ruths MD Electronically signed by Kirk Ruths MD Signature Date/Time: 06/23/2021/11:41:05 AM    Final     Microbiology: No results found for this or any previous visit (from the past 240 hour(s)).   Labs: Basic Metabolic Panel: Recent Labs  Lab 06/30/21 0332  NA 139  K 3.9  CL 104  CO2 23  GLUCOSE 107*  BUN 14  CREATININE 1.10  CALCIUM 9.4  PHOS 3.3   Liver Function Tests: Recent Labs  Lab 06/30/21 0332  ALBUMIN 3.8   CBC: Recent Labs  Lab 06/30/21 0332  WBC 9.0  HGB 17.8*  HCT 56.2*  MCV 87.0  PLT 365   BNP (last 3 results) Recent Labs    05/30/21 1847 06/01/21 2016 06/23/21 0630  BNP 3,571.7* 2,663.4* 2,185.0*   CBG: Recent Labs  Lab 07/04/21 1113 07/04/21 1724 07/04/21 2326 07/05/21 0548 07/05/21 1125  GLUCAP 169*  121* 134* 113* 125*    Signed:  Barton Dubois MD.  [facility] 07/05/2021, 3:14 PM

## 2021-07-05 NOTE — TOC Initial Note (Signed)
Transition of Care [facility]) - Initial/Assessment Note    Patient Details  Name: Daryl [name] MRN: [id number] Date of Birth: Jun 09, 1961  [facility]) CM/SW Contact:    Annice Needy, LCSW Phone Number: 07/05/2021, 3:17 PM  Clinical Narrative:                 Patient from home. Independent at baseline, drives. Declines SA resoures. Declines HH. Does not follow a heart healthy diet however, he does not eat a lot of salt. Takes daily weights, calls doctor "if I see it goes up."  Expected Discharge Plan: Home/Self Care Barriers to Discharge: No Barriers Identified   Patient Goals and CMS Choice        Expected Discharge Plan and Services Expected Discharge Plan: Home/Self Care         Expected Discharge Date: 07/05/21                                    Prior Living Arrangements/Services                       Activities of Daily Living Home Assistive Devices/Equipment: None ADL Screening (condition at time of admission) Patient's cognitive ability adequate to safely complete daily activities?: Yes Is the patient deaf or have difficulty hearing?: No Does the patient have difficulty seeing, even when wearing glasses/contacts?: No Does the patient have difficulty concentrating, remembering, or making decisions?: No Patient able to express need for assistance with ADLs?: Yes Does the patient have difficulty dressing or bathing?: No Independently performs ADLs?: Yes (appropriate for developmental age) Does the patient have difficulty walking or climbing stairs?: No Weakness of Legs: None Weakness of Arms/Hands: None  Permission Sought/Granted                  Emotional Assessment              Admission diagnosis:  SOB (shortness of breath) [R06.02] Acute systolic congestive heart failure (HCC) [I50.21] Acute on chronic systolic CHF (congestive heart failure) (HCC) [I50.23] Patient Active Problem List   Diagnosis Date Noted    Alcohol abuse with intoxication delirium (HCC)    Alcohol withdrawal delirium (HCC) 06/26/2021   Acute on chronic systolic CHF (congestive heart failure) (HCC) 06/23/2021   Stage 3a chronic kidney disease (HCC) 02/19/2020   ETOH abuse 02/14/2020   Essential hypertension 11/02/2018   Nonischemic cardiomyopathy (HCC)    Chronic combined systolic and diastolic CHF (congestive heart failure) (HCC)    Tobacco abuse 09/11/2018   PCP:  Patient, No Pcp Per (Inactive) Pharmacy:   [id number] [place], [place] - [address] AT [facility] [address] [place] [address] Phone: [phone] Fax: [phone]  [id number] - [place], [place] - [address] AT [facility] [address] [place] [address] Phone: [phone] Fax: [phone]  [facility] [address] [place] [address] Phone: [phone] Fax: [phone]  [facility] [address] [place] [address] Phone: [phone] Fax: [phone]     Social Determinants of Health (SDOH) Interventions    Readmission Risk Interventions No flowsheet data found.

## 2021-07-09 ENCOUNTER — Emergency Department (HOSPITAL_COMMUNITY)
Admission: EM | Admit: 2021-07-09 | Discharge: 2021-07-09 | Discharge status: Against Medical Advice | Payer: Medicaid Other | Attending: Emergency Medicine | Admitting: Emergency Medicine

## 2021-07-09 ENCOUNTER — Emergency Department (HOSPITAL_COMMUNITY): Payer: Medicaid Other

## 2021-07-09 ENCOUNTER — Encounter (HOSPITAL_COMMUNITY): Payer: Self-pay | Admitting: *Deleted

## 2021-07-09 ENCOUNTER — Emergency Department (HOSPITAL_COMMUNITY)
Admission: EM | Admit: 2021-07-09 | Discharge: 2021-07-09 | Disposition: A | Discharge status: Home / Self Care | Payer: Medicaid Other | Source: Home / Self Care | Attending: Emergency Medicine | Admitting: Emergency Medicine

## 2021-07-09 DIAGNOSIS — Z79899 Other long term (current) drug therapy: Secondary | ICD-10-CM | POA: Insufficient documentation

## 2021-07-09 DIAGNOSIS — N1831 Chronic kidney disease, stage 3a: Secondary | ICD-10-CM | POA: Insufficient documentation

## 2021-07-09 DIAGNOSIS — F17210 Nicotine dependence, cigarettes, uncomplicated: Secondary | ICD-10-CM | POA: Insufficient documentation

## 2021-07-09 DIAGNOSIS — F1010 Alcohol abuse, uncomplicated: Secondary | ICD-10-CM | POA: Insufficient documentation

## 2021-07-09 DIAGNOSIS — J45909 Unspecified asthma, uncomplicated: Secondary | ICD-10-CM | POA: Insufficient documentation

## 2021-07-09 DIAGNOSIS — I130 Hypertensive heart and chronic kidney disease with heart failure and stage 1 through stage 4 chronic kidney disease, or unspecified chronic kidney disease: Secondary | ICD-10-CM | POA: Insufficient documentation

## 2021-07-09 DIAGNOSIS — I2510 Atherosclerotic heart disease of native coronary artery without angina pectoris: Secondary | ICD-10-CM | POA: Insufficient documentation

## 2021-07-09 DIAGNOSIS — R441 Visual hallucinations: Secondary | ICD-10-CM | POA: Insufficient documentation

## 2021-07-09 DIAGNOSIS — I5042 Chronic combined systolic (congestive) and diastolic (congestive) heart failure: Secondary | ICD-10-CM | POA: Insufficient documentation

## 2021-07-09 DIAGNOSIS — R443 Hallucinations, unspecified: Secondary | ICD-10-CM

## 2021-07-09 LAB — RAPID URINE DRUG SCREEN, HOSP PERFORMED
Amphetamines: NOT DETECTED
Barbiturates: NOT DETECTED
Benzodiazepines: POSITIVE — AB
Cocaine: POSITIVE — AB
Opiates: NOT DETECTED
Tetrahydrocannabinol: NOT DETECTED

## 2021-07-09 LAB — CBC WITH DIFFERENTIAL/PLATELET
Abs Immature Granulocytes: 0.01 10*3/uL (ref 0.00–0.07)
Basophils Absolute: 0.1 10*3/uL (ref 0.0–0.1)
Basophils Relative: 1 %
Eosinophils Absolute: 0.1 10*3/uL (ref 0.0–0.5)
Eosinophils Relative: 1 %
HCT: 50.9 % (ref 39.0–52.0)
Hemoglobin: 16.3 g/dL (ref 13.0–17.0)
Immature Granulocytes: 0 %
Lymphocytes Relative: 32 %
Lymphs Abs: 2.6 10*3/uL (ref 0.7–4.0)
MCH: 27.8 pg (ref 26.0–34.0)
MCHC: 32 g/dL (ref 30.0–36.0)
MCV: 86.7 fL (ref 80.0–100.0)
Monocytes Absolute: 0.7 10*3/uL (ref 0.1–1.0)
Monocytes Relative: 9 %
Neutro Abs: 4.8 10*3/uL (ref 1.7–7.7)
Neutrophils Relative %: 57 %
Platelets: 372 10*3/uL (ref 150–400)
RBC: 5.87 MIL/uL — ABNORMAL HIGH (ref 4.22–5.81)
RDW: 14.2 % (ref 11.5–15.5)
WBC: 8.3 10*3/uL (ref 4.0–10.5)
nRBC: 0 % (ref 0.0–0.2)

## 2021-07-09 LAB — COMPREHENSIVE METABOLIC PANEL
ALT: 40 U/L (ref 0–44)
AST: 48 U/L — ABNORMAL HIGH (ref 15–41)
Albumin: 4 g/dL (ref 3.5–5.0)
Alkaline Phosphatase: 113 U/L (ref 38–126)
Anion gap: 9 (ref 5–15)
BUN: 17 mg/dL (ref 6–20)
CO2: 26 mmol/L (ref 22–32)
Calcium: 9.5 mg/dL (ref 8.9–10.3)
Chloride: 104 mmol/L (ref 98–111)
Creatinine, Ser: 1.28 mg/dL — ABNORMAL HIGH (ref 0.61–1.24)
GFR, Estimated: >60 mL/min (ref >60)
Glucose, Bld: 91 mg/dL (ref 70–99)
Potassium: 3.5 mmol/L (ref 3.5–5.1)
Sodium: 139 mmol/L (ref 135–145)
Total Bilirubin: 1.5 mg/dL — ABNORMAL HIGH (ref 0.3–1.2)
Total Protein: 7.7 g/dL (ref 6.5–8.1)

## 2021-07-09 LAB — ETHANOL: Alcohol, Ethyl (B): <10 mg/dL (ref <10)

## 2021-07-09 NOTE — Discharge Instructions (Signed)
Follow-up with [facility] in the next couple days for recheck.  Return if any problem

## 2021-07-09 NOTE — ED Notes (Addendum)
Endores a sore throat, headache, and "[not liking] those meds from my heart doc."Pt denies SI, HI. Endorses seeing "friendly ghosts" but arrived to ER voluntarily. Beverage provided and reasons for [facility] policies explained. Pt maintains he would like to leave. Tells CN same response that he is not SI/HI and would like to leave. Able to articulate thought process, that he is worried he will miss a callif he does not have his cell phone. Staff offer for him to remove needed bnumbers from phone, declines. Patient given all belongings and LWBS after triage.

## 2021-07-09 NOTE — ED Notes (Signed)
Patient verbalizes understanding of discharge instructions. Opportunity for questioning and answers were provided. Armband removed by staff, pt discharged from ED to home. Refuses to stay for vital signs or for RN to fully go over D/C instructions, states he heard enough from the doctor. "Just leave me be and let me go on if I'm going on." Belongings returned prior to departure

## 2021-07-09 NOTE — ED Triage Notes (Signed)
Patient is uncooperative when asked about his reasons for being here. States he is tired of explaining story to different people

## 2021-07-09 NOTE — ED Notes (Signed)
Ambulatory, A&Ox4

## 2021-07-09 NOTE — ED Notes (Signed)
Security called to wand pt  

## 2021-07-09 NOTE — ED Provider Notes (Signed)
[facility] EMERGENCY DEPARTMENT Provider Note   CSN: [id number] Arrival date & time: 07/09/21  1345     History No chief complaint on file.   Daryl [name] is a 60 y.o. male.  Patient has a history of alcohol abuse and congestive heart failure.  He presented today because he states he has been seeing some spirits.  Patient is not suicidal not homicidal and not having visual hallucinations now.  The history is provided by the patient and medical records. No language interpreter was used.  Altered Mental Status Presenting symptoms: behavior changes   Severity:  Mild Most recent episode:  Today Episode history:  Single Timing:  Intermittent Progression:  Resolved Chronicity:  New Context: alcohol use   Associated symptoms: no abdominal pain, no hallucinations, no headaches, no rash and no seizures       Past Medical History:  Diagnosis Date   Asthma    Chronic combined systolic and diastolic CHF (congestive heart failure) ([place])    Dyslipidemia    Essential hypertension 11/02/2018   ETOH abuse 02/14/2020   Nonobstructive CAD    Non-obstructive disease on cardiac cath in 08/2018   NSVT (nonsustained ventricular tachycardia)    PNA (pneumonia) 09/11/2018   Sepsis ([place]) 09/11/2018   Stage 3a chronic kidney disease ([place]) 02/19/2020   Tobacco abuse 09/11/2018    Patient Active Problem List   Diagnosis Date Noted   Alcohol abuse with intoxication delirium ([place])    Alcohol withdrawal delirium ([place]) 06/26/2021   Acute on chronic systolic CHF (congestive heart failure) ([place]) 06/23/2021   Stage 3a chronic kidney disease ([place]) 02/19/2020   ETOH abuse 02/14/2020   Essential hypertension 11/02/2018   Nonischemic cardiomyopathy (HCC)    Chronic combined systolic and diastolic CHF (congestive heart failure) ([place])    Tobacco abuse 09/11/2018    Past Surgical History:  Procedure Laterality Date   IR THORACENTESIS ASP PLEURAL SPACE W/IMG GUIDE  09/13/2018   RIGHT/LEFT HEART  CATH AND CORONARY ANGIOGRAPHY N/A 09/15/2018   Procedure: RIGHT/LEFT HEART CATH AND CORONARY ANGIOGRAPHY;  Surgeon: Leonie Man, MD;  Location: [place] [facility];  Service: Cardiovascular;  Laterality: N/A;       No family history on file.  Social History   Tobacco Use   Smoking status: Every Day    Packs/day: 0.50    Types: Cigarettes   Smokeless tobacco: Never  Vaping Use   Vaping Use: Never used  Substance Use Topics   Alcohol use: Yes   Drug use: Never    Home Medications Prior to Admission medications   Medication Sig Start Date End Date Taking? Authorizing Provider  atorvastatin (LIPITOR) 40 MG tablet TAKE 1 TABLET (40 MG TOTAL) BY MOUTH DAILY AT 6 PM. Patient not taking: No sig reported 08/08/20 08/08/21  Minus Breeding, MD  chlordiazePOXIDE (LIBRIUM) 5 MG capsule Take 3 tablets by mouth daily 3 times a day x1 day; then 2 tablets by mouth 3 times a day x2-day; then 1 tablet by mouth 3 times a day x2 days; then 1 tablet by mouth 2 times a day x2-day; then 1 tablet by mouth daily x3 days and stop Librium. 07/05/21   Barton Dubois, MD  folic acid (FOLVITE) 1 MG tablet Take 1 tablet (1 mg total) by mouth daily. 07/05/21   Barton Dubois, MD  furosemide (LASIX) 40 MG tablet Take 1 tablet (40 mg total) by mouth daily. 07/05/21   Barton Dubois, MD  irbesartan (AVAPRO) 75 MG tablet Take 0.5  tablets (37.5 mg total) by mouth daily. 07/06/21   Barton Dubois, MD  metoprolol succinate (TOPROL-XL) 50 MG 24 hr tablet Take 1 tablet (50 mg total) by mouth daily. Take with or immediately following a meal. 07/06/21   Barton Dubois, MD  potassium chloride SA (KLOR-CON) 20 MEQ tablet Take 2 tablets (40 mEq total) by mouth daily. 07/05/21   Barton Dubois, MD  spironolactone (ALDACTONE) 25 MG tablet Take 0.5 tablets (12.5 mg total) by mouth daily. 07/06/21   Barton Dubois, MD  thiamine 100 MG tablet Take 1 tablet (100 mg total) by mouth in the morning and at bedtime. 07/05/21   Barton Dubois, MD    Allergies    Penicillins  Review of Systems   Review of Systems  Constitutional:  Negative for appetite change and fatigue.  HENT:  Negative for congestion, ear discharge and sinus pressure.   Eyes:  Negative for discharge.  Respiratory:  Negative for cough.   Cardiovascular:  Negative for chest pain.  Gastrointestinal:  Negative for abdominal pain and diarrhea.  Genitourinary:  Negative for frequency and hematuria.  Musculoskeletal:  Negative for back pain.  Skin:  Negative for rash.  Neurological:  Negative for seizures and headaches.  Psychiatric/Behavioral:  Negative for hallucinations.        Visual hallucination   Physical Exam Updated Vital Signs BP 124/79 (BP Location: Right Arm)   Pulse 94   Temp (!) 97.4 F (36.3 C) (Oral)   Resp 16   SpO2 100%   Physical Exam Vitals and nursing note reviewed.  Constitutional:      Appearance: He is well-developed.  HENT:     Head: Normocephalic.     Nose: Nose normal.  Eyes:     General: No scleral icterus.    Conjunctiva/sclera: Conjunctivae normal.  Neck:     Thyroid: No thyromegaly.  Cardiovascular:     Rate and Rhythm: Normal rate and regular rhythm.     Heart sounds: No murmur heard.   No friction rub. No gallop.  Pulmonary:     Breath sounds: No stridor. No wheezing or rales.  Chest:     Chest wall: No tenderness.  Abdominal:     General: There is no distension.     Tenderness: There is no abdominal tenderness. There is no rebound.  Musculoskeletal:        General: Normal range of motion.     Cervical back: Neck supple.  Lymphadenopathy:     Cervical: No cervical adenopathy.  Skin:    Findings: No erythema or rash.  Neurological:     Mental Status: He is alert and oriented to person, place, and time.     Motor: No abnormal muscle tone.     Coordination: Coordination normal.  Psychiatric:     Comments: Patient not suicidal or homicidal.  He states he did have some visual hallucinations  earlier    ED Results / Procedures / Treatments   Labs (all labs ordered are listed, but only abnormal results are displayed) Labs Reviewed  CBC WITH DIFFERENTIAL/PLATELET - Abnormal; Notable for the following components:      Result Value   RBC 5.87 (*)    All other components within normal limits  COMPREHENSIVE METABOLIC PANEL - Abnormal; Notable for the following components:   Creatinine, Ser 1.28 (*)    AST 48 (*)    Total Bilirubin 1.5 (*)    All other components within normal limits  ETHANOL    EKG EKG  Interpretation  Date/Time:  Tuesday July 09 2021 17:06:54 EST Ventricular Rate:  84 PR Interval:  158 QRS Duration: 76 QT Interval:  400 QTC Calculation: 472 R Axis:   -3 Text Interpretation: Normal sinus rhythm with sinus arrhythmia Minimal voltage criteria for LVH, may be normal variant ( Cornell product ) Nonspecific T wave abnormality Prolonged QT Abnormal ECG Confirmed by Bethann Berkshire [phone]) on 07/09/2021 5:23:18 PM  Radiology CT Head Wo Contrast  Result Date: 07/09/2021 CLINICAL DATA:  Cerebral hemorrhage suspected EXAM: CT HEAD WITHOUT CONTRAST TECHNIQUE: Contiguous axial images were obtained from the base of the skull through the vertex without intravenous contrast. COMPARISON:  Head CT 06/30/2021 FINDINGS: Brain: No evidence of acute intracranial hemorrhage or extra-axial collection. Unchanged focal hypoattenuation within the left cerebellum.No evidence of mass lesion/concern mass effect.The ventricles are normal in size.Scattered subcortical and periventricular white matter hypodensities, nonspecific but likely sequela of chronic small vessel ischemic disease. Vascular: No hyperdense vessel or unexpected calcification. Skull: Normal. Negative for fracture or focal lesion. Sinuses/Orbits: No acute finding. Other: None. IMPRESSION: No acute intracranial abnormality. Unchanged focal hypoattenuation within the left cerebellum which could represent a subacute  infarct. Electronically Signed   By: Caprice Renshaw M.D.   On: 07/09/2021 16:33    Procedures Procedures   Medications Ordered in ED Medications - No data to display  ED Course  I have reviewed the triage vital signs and the nursing notes.  Pertinent labs & imaging results that were available during my care of the patient were reviewed by me and considered in my medical decision making (see chart for details). Patient's drug screen is positive for cocaine and benzos.  Look for the rest of his labs and x-rays are unremarkable.   MDM Rules/Calculators/A&P                           Patient with EtOH abuse and some visual hallucinations.  He is not having any now and is not suicidal or homicidal.  He will be discharged to follow-up with behavioral health Final Clinical Impression(s) / ED Diagnoses Final diagnoses:  None    Rx / DC Orders ED Discharge Orders     None        Bethann Berkshire, MD 07/09/21 Flossie Buffy

## 2021-07-13 ENCOUNTER — Emergency Department (HOSPITAL_COMMUNITY)
Admission: EM | Admit: 2021-07-13 | Discharge: 2021-07-13 | Disposition: A | Discharge status: Home / Self Care | Payer: Medicaid Other | Attending: Emergency Medicine | Admitting: Emergency Medicine

## 2021-07-13 ENCOUNTER — Emergency Department (HOSPITAL_COMMUNITY): Payer: Medicaid Other

## 2021-07-13 ENCOUNTER — Other Ambulatory Visit: Payer: Self-pay

## 2021-07-13 ENCOUNTER — Emergency Department (HOSPITAL_COMMUNITY)
Admission: EM | Admit: 2021-07-13 | Discharge: 2021-07-14 | Disposition: A | Discharge status: Home / Self Care | Payer: Medicaid Other | Source: Home / Self Care | Attending: Emergency Medicine | Admitting: Emergency Medicine

## 2021-07-13 ENCOUNTER — Encounter (HOSPITAL_COMMUNITY): Payer: Self-pay | Admitting: Emergency Medicine

## 2021-07-13 DIAGNOSIS — J45909 Unspecified asthma, uncomplicated: Secondary | ICD-10-CM | POA: Insufficient documentation

## 2021-07-13 DIAGNOSIS — M7041 Prepatellar bursitis, right knee: Secondary | ICD-10-CM | POA: Insufficient documentation

## 2021-07-13 DIAGNOSIS — F17210 Nicotine dependence, cigarettes, uncomplicated: Secondary | ICD-10-CM | POA: Insufficient documentation

## 2021-07-13 DIAGNOSIS — Z79899 Other long term (current) drug therapy: Secondary | ICD-10-CM | POA: Insufficient documentation

## 2021-07-13 DIAGNOSIS — M79604 Pain in right leg: Secondary | ICD-10-CM | POA: Diagnosis present

## 2021-07-13 DIAGNOSIS — I5042 Chronic combined systolic (congestive) and diastolic (congestive) heart failure: Secondary | ICD-10-CM | POA: Insufficient documentation

## 2021-07-13 DIAGNOSIS — M7051 Other bursitis of knee, right knee: Secondary | ICD-10-CM

## 2021-07-13 DIAGNOSIS — N1831 Chronic kidney disease, stage 3a: Secondary | ICD-10-CM | POA: Diagnosis not present

## 2021-07-13 DIAGNOSIS — Z5321 Procedure and treatment not carried out due to patient leaving prior to being seen by health care provider: Secondary | ICD-10-CM | POA: Insufficient documentation

## 2021-07-13 DIAGNOSIS — I130 Hypertensive heart and chronic kidney disease with heart failure and stage 1 through stage 4 chronic kidney disease, or unspecified chronic kidney disease: Secondary | ICD-10-CM | POA: Insufficient documentation

## 2021-07-13 DIAGNOSIS — Y9301 Activity, walking, marching and hiking: Secondary | ICD-10-CM | POA: Insufficient documentation

## 2021-07-13 NOTE — ED Provider Notes (Signed)
Emergency Medicine Provider Triage Evaluation Note  Daryl [name] , a 60 y.o. male  was evaluated in triage.  Pt complains of right knee pain.  He states that he has right knee pain.  He states that this started about a week ago.  He denies any fevers.  He denies any injuries.  He states he knows what it is, that he has fluid in there.  Review of Systems  Positive: Right knee pain and swelling Negative: Fever  Physical Exam  BP 133/89 (BP Location: Left Arm)   Pulse 95   Temp 98.1 F (36.7 C) (Oral)   Resp 17   Ht 5\' 9"  (1.753 m)   SpO2 100%   BMI 23.08 kg/m  Gen:   Awake, no distress   Resp:  Normal effort  MSK:   Moves extremities without difficulty  Other:  Mild edema of right knee with out deformity  Medical Decision Making  Medically screening exam initiated at 4:24 PM.  Appropriate orders placed.  was informed that the remainder of the evaluation will be completed by another provider, this initial triage assessment does not replace that evaluation, and the importance of remaining in the ED until their evaluation is complete.     [name], PA-C 07/13/21 1719    07/15/21, MD 07/14/21 2326

## 2021-07-13 NOTE — ED Provider Notes (Signed)
Emergency Medicine Provider Triage Evaluation Note  Daryl [name] , a 60 y.o. male  was evaluated in triage.  Pt complains of right knee pain and swelling.  Patient states that symptoms have been present for "a little over a week."  Patient denies any falls or traumas.  Review of Systems  Positive: Right knee swelling and tenderness Negative: Fever, chills, rash  Physical Exam  BP 128/82 (BP Location: Right Arm)   Pulse 97   Temp 98.6 F (37 C) (Oral)   Resp 17   SpO2 100%  Gen:   Awake, no distress   Resp:  Normal effort  MSK:   Moves extremities without difficulty; mild swelling to right knee.  Decreased range of motion secondary to complaints of pain.  No erythema or warmth to right knee. Other:    Medical Decision Making  Medically screening exam initiated at 9:26 PM.  Appropriate orders placed.  [name] was informed that the remainder of the evaluation will be completed by another provider, this initial triage assessment does not replace that evaluation, and the importance of remaining in the ED until their evaluation is complete.     Berneice Heinrich 07/13/21 2128    Charlynne Pander, MD 07/14/21 2322

## 2021-07-13 NOTE — ED Triage Notes (Signed)
Patient reports right leg pain 9/10 starting one week ago.

## 2021-07-13 NOTE — ED Triage Notes (Signed)
Patient reports right knee pain with mild swelling onset last week , denies injury / ambulatory.

## 2021-07-14 ENCOUNTER — Ambulatory Visit (HOSPITAL_COMMUNITY)
Admission: EM | Admit: 2021-07-14 | Discharge: 2021-07-14 | Disposition: A | Discharge status: Home / Self Care | Payer: Medicaid Other

## 2021-07-14 NOTE — ED Notes (Signed)
PT refused vitals , PT stated I was the devil and using the devil tools.and I  will not participate .

## 2021-07-14 NOTE — ED Notes (Addendum)
Ortho tech at bedside.  Pt declined knee brace from ortho.

## 2021-07-14 NOTE — ED Provider Notes (Signed)
[facility] EMERGENCY DEPARTMENT Provider Note   CSN: [id number] Arrival date & time: 07/13/21  2020     History Chief Complaint  Patient presents with   Knee/Elbow Pain     Daryl [name] is a 60 y.o. male.  Patient with history of asthma, CHF, CKD presents today with complaint of right knee pain and swelling.  He states that same has been ongoing for "several weeks" following increased walking and exercise.  He denies injury. He states he has not tried anything for his symptoms, and that this is never happened before.  He states that the pain is sharp in nature and does not radiate.  He denies fevers or chills, numbness/tingling, burning or itching. He is ambulatory without difficulty.  The history is provided by the patient. No language interpreter was used.      Past Medical History:  Diagnosis Date   Asthma    Chronic combined systolic and diastolic CHF (congestive heart failure) ([place])    Dyslipidemia    Essential hypertension 11/02/2018   ETOH abuse 02/14/2020   Nonobstructive CAD    Non-obstructive disease on cardiac cath in 08/2018   NSVT (nonsustained ventricular tachycardia)    PNA (pneumonia) 09/11/2018   Sepsis ([place]) 09/11/2018   Stage 3a chronic kidney disease ([place]) 02/19/2020   Tobacco abuse 09/11/2018    Patient Active Problem List   Diagnosis Date Noted   Alcohol abuse with intoxication delirium ([place])    Alcohol withdrawal delirium ([place]) 06/26/2021   Acute on chronic systolic CHF (congestive heart failure) ([place]) 06/23/2021   Stage 3a chronic kidney disease ([place]) 02/19/2020   ETOH abuse 02/14/2020   Essential hypertension 11/02/2018   Nonischemic cardiomyopathy (H[facility])    Chronic combined systolic and diastolic CHF (congestive heart failure) ([place])    Tobacco abuse 09/11/2018    Past Surgical History:  Procedure Laterality Date   IR THORACENTESIS ASP PLEURAL SPACE W/IMG GUIDE  09/13/2018   RIGHT/LEFT HEART CATH AND CORONARY ANGIOGRAPHY  N/A 09/15/2018   Procedure: RIGHT/LEFT HEART CATH AND CORONARY ANGIOGRAPHY;  Surgeon: Leonie Man, MD;  Location: [place] CV LAB;  Service: Cardiovascular;  Laterality: N/A;       No family history on file.  Social History   Tobacco Use   Smoking status: Every Day    Packs/day: 0.50    Types: Cigarettes   Smokeless tobacco: Never  Vaping Use   Vaping Use: Never used  Substance Use Topics   Alcohol use: Yes   Drug use: Never    Home Medications Prior to Admission medications   Medication Sig Start Date End Date Taking? Authorizing Provider  atorvastatin (LIPITOR) 40 MG tablet TAKE 1 TABLET (40 MG TOTAL) BY MOUTH DAILY AT 6 PM. Patient not taking: No sig reported 08/08/20 08/08/21  Minus Breeding, MD  chlordiazePOXIDE (LIBRIUM) 5 MG capsule Take 3 tablets by mouth daily 3 times a day x1 day; then 2 tablets by mouth 3 times a day x2-day; then 1 tablet by mouth 3 times a day x2 days; then 1 tablet by mouth 2 times a day x2-day; then 1 tablet by mouth daily x3 days and stop Librium. 07/05/21   Barton Dubois, MD  folic acid (FOLVITE) 1 MG tablet Take 1 tablet (1 mg total) by mouth daily. 07/05/21   Barton Dubois, MD  furosemide (LASIX) 40 MG tablet Take 1 tablet (40 mg total) by mouth daily. 07/05/21   Barton Dubois, MD  irbesartan (AVAPRO) 75 MG tablet Take  0.5 tablets (37.5 mg total) by mouth daily. 07/06/21   Vassie Loll, MD  metoprolol succinate (TOPROL-XL) 50 MG 24 hr tablet Take 1 tablet (50 mg total) by mouth daily. Take with or immediately following a meal. 07/06/21   Vassie Loll, MD  potassium chloride SA (KLOR-CON) 20 MEQ tablet Take 2 tablets (40 mEq total) by mouth daily. 07/05/21   Vassie Loll, MD  spironolactone (ALDACTONE) 25 MG tablet Take 0.5 tablets (12.5 mg total) by mouth daily. 07/06/21   Vassie Loll, MD  thiamine 100 MG tablet Take 1 tablet (100 mg total) by mouth in the morning and at bedtime. 07/05/21   Vassie Loll, MD    Allergies     Penicillins  Review of Systems   Review of Systems  Constitutional:  Negative for chills and fever.  Gastrointestinal:  Negative for nausea and vomiting.  Musculoskeletal:  Positive for joint swelling. Negative for gait problem.  Skin:  Negative for rash and wound.  Psychiatric/Behavioral:  Negative for confusion and decreased concentration.   All other systems reviewed and are negative.  Physical Exam Updated Vital Signs BP 102/66   Pulse 76   Temp 98.1 F (36.7 C) (Oral)   Resp 12   SpO2 100%   Physical Exam Vitals and nursing note reviewed.  Constitutional:      General: He is not in acute distress.    Appearance: Normal appearance. He is normal weight. He is not ill-appearing, toxic-appearing or diaphoretic.  HENT:     Head: Normocephalic and atraumatic.  Cardiovascular:     Rate and Rhythm: Normal rate.  Pulmonary:     Effort: Pulmonary effort is normal. No respiratory distress.  Abdominal:     General: Abdomen is flat.     Palpations: Abdomen is soft.  Musculoskeletal:        General: Normal range of motion.     Cervical back: Normal range of motion.     Right knee: Swelling present. No deformity, erythema, ecchymosis, lacerations, bony tenderness or crepitus. Normal range of motion. Tenderness present over the patellar tendon. No LCL laxity, MCL laxity, ACL laxity or PCL laxity. Normal alignment, normal meniscus and normal patellar mobility. Normal pulse.     Instability Tests: Anterior drawer test negative. Posterior drawer test negative. Anterior Lachman test negative. Medial McMurray test negative and lateral McMurray test negative.     Comments: Mild swelling noted to the superior right knee without warmth or erythema.  Pain noted to passive flexion of right knee. Distal pulses intact and 2+. Full ROM and 5/5 strength to right knee. Negative Ballottement test.   Skin:    General: Skin is warm and dry.  Neurological:     General: No focal deficit present.      Mental Status: He is alert.  Psychiatric:        Mood and Affect: Mood normal.        Behavior: Behavior normal.    ED Results / Procedures / Treatments   Labs (all labs ordered are listed, but only abnormal results are displayed) Labs Reviewed - No data to display  EKG None  Radiology DG Knee Complete 4 Views Right  Result Date: 07/13/2021 CLINICAL DATA:  Pain and swelling. EXAM: RIGHT KNEE - COMPLETE 4+ VIEW COMPARISON:  None. FINDINGS: There is mild soft tissue fullness anteriorly at the level of the joint. There is a small suprapatellar bursal effusion. Mild osteopenia is present without evidence of fractures. There is mild narrowing and trace spurring  of the medial femorotibial compartment, without other findings or arthrosis. Anterior superior patellar enthesophyte is seen at the quadriceps insertion. No radiopaque foreign body. IMPRESSION: 1. Osteopenia and degenerative change without evidence of fractures. 2. Small suprapatellar bursal effusion, with mild anterior soft tissue fullness. Electronically Signed   By: Telford Nab M.D.   On: 07/13/2021 22:56    Procedures Procedures   Medications Ordered in ED Medications - No data to display  ED Course  I have reviewed the triage vital signs and the nursing notes.  Pertinent labs & imaging results that were available during my care of the patient were reviewed by me and considered in my medical decision making (see chart for details).    MDM Rules/Calculators/A&P                         Patient presents today with several weeks of right knee pain and swelling. He states that pain began following an increase in walking and exercise. Imaging negative for fracture or dislocation, reveals small suprapatellar bursal effusion.  Patient is afebrile, nontoxic-appearing, and in no acute distress. Knee is mildly swollen and tender in the superior right knee region without warmth or erythema, low suspicion for septic arthritis.  Additionally, no history of gout, area is not inflamed, erythematous, it does not burn or itch, low suspicion for gout or pseudogout. Feel that patient likely has bursitis given clinical picture. Will recommend NSAIDs, RICE, and give knee brace and orthopedics follow-up. Patient amenable with plan of discharge, educated on red flag symptoms that would prompt immediate return.  Patient discharged in stable condition.  Findings and plan of care discussed with supervising physician Dr. Sherry Ruffing who is in agreement.    Final Clinical Impression(s) / ED Diagnoses Final diagnoses:  Suprapatellar bursitis of right knee    Rx / DC Orders ED Discharge Orders     None     An After Visit Summary was printed and given to the patient.    Nestor Lewandowsky 07/14/21 [id number]    Tegeler, Gwenyth Allegra, MD 07/14/21 1141

## 2021-07-14 NOTE — Progress Notes (Signed)
Orthopedic Tech Progress Note Patient Details:  Daryl [name] 02/15/1961 [id number]  Patient ID: [name], male   DOB: 1961/06/06, 60 y.o.   MRN: [id number] Patient refused knee brace.   Nels Munn L Carmel Garfield 07/14/2021, 10:03 AM

## 2021-07-14 NOTE — Discharge Instructions (Addendum)
Imaging of your knee in the emergency department today was negative for fracture or dislocation. It did show that you have some swelling in the protective pad underneath the tendon that goes over your knee cap. This is known as bursitis and is a common complication of overuse. The first line management of this is over the counter NSAIDs such as ibuprofen coupled with rest, ice, compression, and elevation. I have given you a knee brace in the department this evening, I would like for you to wear it when walking while you are experiencing pain.   Additionally, I have given you a referral to orthopedics. Please call them to schedule an appointment for further management if you continue to experience symptoms.  Return if your knee becomes warm, red, inflamed, you develop fevers, or if you develop any new or worsening symptoms.

## 2021-07-20 ENCOUNTER — Encounter (HOSPITAL_COMMUNITY): Payer: Self-pay | Admitting: Emergency Medicine

## 2021-07-20 ENCOUNTER — Emergency Department (HOSPITAL_COMMUNITY)
Admission: EM | Admit: 2021-07-20 | Discharge: 2021-07-20 | Disposition: A | Discharge status: Home / Self Care | Payer: Medicaid Other | Attending: Emergency Medicine | Admitting: Emergency Medicine

## 2021-07-20 DIAGNOSIS — M25561 Pain in right knee: Secondary | ICD-10-CM | POA: Diagnosis present

## 2021-07-20 DIAGNOSIS — Z79899 Other long term (current) drug therapy: Secondary | ICD-10-CM | POA: Insufficient documentation

## 2021-07-20 DIAGNOSIS — N1831 Chronic kidney disease, stage 3a: Secondary | ICD-10-CM | POA: Diagnosis not present

## 2021-07-20 DIAGNOSIS — I5043 Acute on chronic combined systolic (congestive) and diastolic (congestive) heart failure: Secondary | ICD-10-CM | POA: Diagnosis not present

## 2021-07-20 DIAGNOSIS — I130 Hypertensive heart and chronic kidney disease with heart failure and stage 1 through stage 4 chronic kidney disease, or unspecified chronic kidney disease: Secondary | ICD-10-CM | POA: Diagnosis not present

## 2021-07-20 DIAGNOSIS — F17210 Nicotine dependence, cigarettes, uncomplicated: Secondary | ICD-10-CM | POA: Diagnosis not present

## 2021-07-20 DIAGNOSIS — J45909 Unspecified asthma, uncomplicated: Secondary | ICD-10-CM | POA: Insufficient documentation

## 2021-07-20 NOTE — ED Provider Notes (Signed)
[facility] DEPT Provider Note   CSN: [id number] Arrival date & time: 07/20/21  0415     History Chief Complaint  Patient presents with   Abdominal Pain    Daryl [name] is a 60 y.o. male with a past medical history of alcohol use disorder and stage III kidney disease presenting today with a complaint of right knee pain.  Is unable to state how long its been going on.  Of note he was seen for knee pain 6 days ago.  Reports "you did not help me last time."  When I told the patient that I have never met him before he stated "yes to have.  You know me.  But voices tell me not to mess with you."  Patient with abnormal affect.  Difficult to obtain history.  Suspect psychiatric disorder.  History of EtOH use, withdrawal and delirium.  Originally checked in for abdominal pain, however denies that at this time.   Past Medical History:  Diagnosis Date   Asthma    Chronic combined systolic and diastolic CHF (congestive heart failure) ([place])    Dyslipidemia    Essential hypertension 11/02/2018   ETOH abuse 02/14/2020   Nonobstructive CAD    Non-obstructive disease on cardiac cath in 08/2018   NSVT (nonsustained ventricular tachycardia)    PNA (pneumonia) 09/11/2018   Sepsis ([place]) 09/11/2018   Stage 3a chronic kidney disease ([place]) 02/19/2020   Tobacco abuse 09/11/2018    Patient Active Problem List   Diagnosis Date Noted   Alcohol abuse with intoxication delirium ([place])    Alcohol withdrawal delirium ([place]) 06/26/2021   Acute on chronic systolic CHF (congestive heart failure) ([place]) 06/23/2021   Stage 3a chronic kidney disease ([place]) 02/19/2020   ETOH abuse 02/14/2020   Essential hypertension 11/02/2018   Nonischemic cardiomyopathy (HCC)    Chronic combined systolic and diastolic CHF (congestive heart failure) ([place])    Tobacco abuse 09/11/2018    Past Surgical History:  Procedure Laterality Date   IR THORACENTESIS ASP PLEURAL SPACE W/IMG GUIDE  09/13/2018    RIGHT/LEFT HEART CATH AND CORONARY ANGIOGRAPHY N/A 09/15/2018   Procedure: RIGHT/LEFT HEART CATH AND CORONARY ANGIOGRAPHY;  Surgeon: Leonie Man, MD;  Location: [facility];  Service: Cardiovascular;  Laterality: N/A;       History reviewed. No pertinent family history.  Social History   Tobacco Use   Smoking status: Every Day    Packs/day: 0.50    Types: Cigarettes   Smokeless tobacco: Never  Vaping Use   Vaping Use: Never used  Substance Use Topics   Alcohol use: Yes   Drug use: Never    Home Medications Prior to Admission medications   Medication Sig Start Date End Date Taking? Authorizing Provider  atorvastatin (LIPITOR) 40 MG tablet TAKE 1 TABLET (40 MG TOTAL) BY MOUTH DAILY AT 6 PM. Patient not taking: Reported on 06/02/2021 08/08/20 08/08/21  Minus Breeding, MD  carvedilol (COREG) 3.125 MG tablet Take 3.125 mg by mouth 2 (two) times daily with a meal.    [provider]  chlordiazePOXIDE (LIBRIUM) 5 MG capsule Take 3 tablets by mouth daily 3 times a day x1 day; then 2 tablets by mouth 3 times a day x2-day; then 1 tablet by mouth 3 times a day x2 days; then 1 tablet by mouth 2 times a day x2-day; then 1 tablet by mouth daily x3 days and stop Librium. Patient not taking: Reported on 07/20/2021 07/05/21   Barton Dubois,  MD  folic acid (FOLVITE) 1 MG tablet Take 1 tablet (1 mg total) by mouth daily. 07/05/21   Barton Dubois, MD  furosemide (LASIX) 40 MG tablet Take 1 tablet (40 mg total) by mouth daily. 07/05/21   Barton Dubois, MD  irbesartan (AVAPRO) 75 MG tablet Take 0.5 tablets (37.5 mg total) by mouth daily. 07/06/21   Barton Dubois, MD  metoprolol succinate (TOPROL-XL) 50 MG 24 hr tablet Take 1 tablet (50 mg total) by mouth daily. Take with or immediately following a meal. 07/06/21   Barton Dubois, MD  potassium chloride SA (KLOR-CON) 20 MEQ tablet Take 2 tablets (40 mEq total) by mouth daily. 07/05/21   Barton Dubois, MD  spironolactone  (ALDACTONE) 25 MG tablet Take 0.5 tablets (12.5 mg total) by mouth daily. 07/06/21   Barton Dubois, MD  thiamine 100 MG tablet Take 1 tablet (100 mg total) by mouth in the morning and at bedtime. 07/05/21   Barton Dubois, MD  valsartan (DIOVAN) 40 MG tablet Take 40 mg by mouth 2 (two) times daily.    [provider]    Allergies    Penicillins  Review of Systems   Review of Systems  Reason unable to perform ROS: LEVEL 5 CAVEAT due to AMS.  Gastrointestinal:  Positive for abdominal pain.   Physical Exam Updated Vital Signs BP 121/89   Pulse 81   Temp (!) 96.9 F (36.1 C) (Axillary)   Resp 19   Ht _0  (1.753 m)   Wt 85.7 kg   SpO2 98%   BMI 27.91 kg/m   Physical Exam Vitals and nursing note reviewed.  Constitutional:      Appearance: Normal appearance.  HENT:     Head: Normocephalic and atraumatic.  Eyes:     General: No scleral icterus.    Conjunctiva/sclera: Conjunctivae normal.  Pulmonary:     Effort: Pulmonary effort is normal. No respiratory distress.  Skin:    Findings: No rash.  Neurological:     Mental Status: He is alert.  Psychiatric:        Mood and Affect: Mood normal.    ED Results / Procedures / Treatments   Labs (all labs ordered are listed, but only abnormal results are displayed) Labs Reviewed - No data to display  EKG None  Radiology No results found.  Procedures Procedures   Medications Ordered in ED Medications - No data to display  ED Course  I have reviewed the triage vital signs and the nursing notes.  Pertinent labs & imaging results that were available during my care of the patient were reviewed by me and considered in my medical decision making (see chart for details).    MDM Rules/Calculators/A&P Patient was fully evaluated by me.  He was in no acute distress.  Flat affect and tangential speaking.  Does not appear to be on any psychiatric medications however has had multiple alcohol related visits.  Was  prescribed Librium taper 2 weeks ago.  Patient refused to get in a gown for both myself and the RN.  I am unable to fully assess his knee.  On the 20th there was concern for septic arthritis.  I attempted to get blood work however RN notified me that patient would not let her and began to be verbally aggressive.  Because the patient will not allow [place] to perform a full evaluation, patient to be discharged at this time.  He reports "I guess I have no choice, I might as well leave."  Final Clinical Impression(s) / ED Diagnoses Final diagnoses:  Acute pain of right knee    Rx / DC Orders Results and diagnoses were explained to the patient. Return precautions discussed in full. Patient had no additional questions and expressed complete understanding.  This chart was dictated using voice recognition software.  Despite best efforts to proofread,  errors can occur which can change the documentation meaning.     Rhae Hammock, PA-C 07/20/21 0804    Jeanell Sparrow, DO 07/20/21 [phone]

## 2021-07-20 NOTE — ED Triage Notes (Signed)
[place] He reports RUQ pain and hematuria x 2 days. Also endorses some vomiting this morning.

## 2021-07-20 NOTE — ED Notes (Signed)
Blood draw attempted, patient continued to express concerns stating that "You're trying to inject me with something" despite reassurances that only blood would be drawn, and then "If you're lying to me". This nurse felt uncomfortable and left the room.

## 2021-07-20 NOTE — ED Notes (Signed)
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

## 2021-08-01 ENCOUNTER — Ambulatory Visit: Payer: Medicaid Other | Admitting: Student

## 2021-08-08 ENCOUNTER — Encounter (HOSPITAL_COMMUNITY): Payer: Self-pay | Admitting: Emergency Medicine

## 2021-08-08 ENCOUNTER — Ambulatory Visit (HOSPITAL_COMMUNITY)
Admission: EM | Admit: 2021-08-08 | Discharge: 2021-08-08 | Disposition: A | Discharge status: Home / Self Care | Payer: Medicaid Other | Source: Home / Self Care

## 2021-08-08 ENCOUNTER — Other Ambulatory Visit: Payer: Self-pay

## 2021-08-08 ENCOUNTER — Emergency Department (HOSPITAL_COMMUNITY)
Admission: EM | Admit: 2021-08-08 | Discharge: 2021-08-08 | Disposition: A | Discharge status: Home / Self Care | Payer: Medicaid Other | Attending: Emergency Medicine | Admitting: Emergency Medicine

## 2021-08-08 ENCOUNTER — Emergency Department (HOSPITAL_COMMUNITY): Payer: Medicaid Other

## 2021-08-08 DIAGNOSIS — I5042 Chronic combined systolic (congestive) and diastolic (congestive) heart failure: Secondary | ICD-10-CM | POA: Diagnosis not present

## 2021-08-08 DIAGNOSIS — J45909 Unspecified asthma, uncomplicated: Secondary | ICD-10-CM | POA: Insufficient documentation

## 2021-08-08 DIAGNOSIS — F22 Delusional disorders: Secondary | ICD-10-CM | POA: Insufficient documentation

## 2021-08-08 DIAGNOSIS — R600 Localized edema: Secondary | ICD-10-CM | POA: Diagnosis not present

## 2021-08-08 DIAGNOSIS — M7022 Olecranon bursitis, left elbow: Secondary | ICD-10-CM | POA: Diagnosis not present

## 2021-08-08 DIAGNOSIS — F17210 Nicotine dependence, cigarettes, uncomplicated: Secondary | ICD-10-CM | POA: Insufficient documentation

## 2021-08-08 DIAGNOSIS — Y939 Activity, unspecified: Secondary | ICD-10-CM | POA: Insufficient documentation

## 2021-08-08 DIAGNOSIS — F4320 Adjustment disorder, unspecified: Secondary | ICD-10-CM | POA: Insufficient documentation

## 2021-08-08 DIAGNOSIS — M7989 Other specified soft tissue disorders: Secondary | ICD-10-CM | POA: Diagnosis present

## 2021-08-08 DIAGNOSIS — I130 Hypertensive heart and chronic kidney disease with heart failure and stage 1 through stage 4 chronic kidney disease, or unspecified chronic kidney disease: Secondary | ICD-10-CM | POA: Diagnosis not present

## 2021-08-08 DIAGNOSIS — F1491 Cocaine use, unspecified, in remission: Secondary | ICD-10-CM | POA: Insufficient documentation

## 2021-08-08 DIAGNOSIS — N1831 Chronic kidney disease, stage 3a: Secondary | ICD-10-CM | POA: Diagnosis not present

## 2021-08-08 DIAGNOSIS — Z79899 Other long term (current) drug therapy: Secondary | ICD-10-CM | POA: Insufficient documentation

## 2021-08-08 DIAGNOSIS — F1091 Alcohol use, unspecified, in remission: Secondary | ICD-10-CM | POA: Insufficient documentation

## 2021-08-08 LAB — CBC WITH DIFFERENTIAL/PLATELET
Abs Immature Granulocytes: 0.01 10*3/uL (ref 0.00–0.07)
Basophils Absolute: 0 10*3/uL (ref 0.0–0.1)
Basophils Relative: 1 %
Eosinophils Absolute: 0.1 10*3/uL (ref 0.0–0.5)
Eosinophils Relative: 1 %
HCT: 40.3 % (ref 39.0–52.0)
Hemoglobin: 12.8 g/dL — ABNORMAL LOW (ref 13.0–17.0)
Immature Granulocytes: 0 %
Lymphocytes Relative: 27 %
Lymphs Abs: 1.7 10*3/uL (ref 0.7–4.0)
MCH: 27.8 pg (ref 26.0–34.0)
MCHC: 31.8 g/dL (ref 30.0–36.0)
MCV: 87.4 fL (ref 80.0–100.0)
Monocytes Absolute: 0.7 10*3/uL (ref 0.1–1.0)
Monocytes Relative: 11 %
Neutro Abs: 3.7 10*3/uL (ref 1.7–7.7)
Neutrophils Relative %: 60 %
Platelets: 319 10*3/uL (ref 150–400)
RBC: 4.61 MIL/uL (ref 4.22–5.81)
RDW: 14.4 % (ref 11.5–15.5)
WBC: 6.3 10*3/uL (ref 4.0–10.5)
nRBC: 0 % (ref 0.0–0.2)

## 2021-08-08 LAB — URINALYSIS, ROUTINE W REFLEX MICROSCOPIC
Bacteria, UA: NONE SEEN
Bilirubin Urine: NEGATIVE
Glucose, UA: NEGATIVE mg/dL
Hgb urine dipstick: NEGATIVE
Ketones, ur: NEGATIVE mg/dL
Leukocytes,Ua: NEGATIVE
Nitrite: NEGATIVE
Protein, ur: 30 mg/dL — AB
Specific Gravity, Urine: 1.018 (ref 1.005–1.030)
pH: 5 (ref 5.0–8.0)

## 2021-08-08 LAB — COMPREHENSIVE METABOLIC PANEL
ALT: 45 U/L — ABNORMAL HIGH (ref 0–44)
AST: 57 U/L — ABNORMAL HIGH (ref 15–41)
Albumin: 3.4 g/dL — ABNORMAL LOW (ref 3.5–5.0)
Alkaline Phosphatase: 125 U/L (ref 38–126)
Anion gap: 8 (ref 5–15)
BUN: 10 mg/dL (ref 6–20)
CO2: 25 mmol/L (ref 22–32)
Calcium: 8.4 mg/dL — ABNORMAL LOW (ref 8.9–10.3)
Chloride: 104 mmol/L (ref 98–111)
Creatinine, Ser: 0.87 mg/dL (ref 0.61–1.24)
GFR, Estimated: >60 mL/min (ref >60)
Glucose, Bld: 118 mg/dL — ABNORMAL HIGH (ref 70–99)
Potassium: 3.5 mmol/L (ref 3.5–5.1)
Sodium: 137 mmol/L (ref 135–145)
Total Bilirubin: 0.8 mg/dL (ref 0.3–1.2)
Total Protein: 6.8 g/dL (ref 6.5–8.1)

## 2021-08-08 LAB — ETHANOL: Alcohol, Ethyl (B): <10 mg/dL (ref <10)

## 2021-08-08 LAB — BRAIN NATRIURETIC PEPTIDE: B Natriuretic Peptide: 2353.1 pg/mL — ABNORMAL HIGH (ref 0.0–100.0)

## 2021-08-08 LAB — RAPID URINE DRUG SCREEN, HOSP PERFORMED
Amphetamines: NOT DETECTED
Barbiturates: NOT DETECTED
Benzodiazepines: NOT DETECTED
Cocaine: NOT DETECTED
Opiates: NOT DETECTED
Tetrahydrocannabinol: NOT DETECTED

## 2021-08-08 LAB — C-REACTIVE PROTEIN: CRP: 4.1 mg/dL — ABNORMAL HIGH (ref <1.0)

## 2021-08-08 LAB — SEDIMENTATION RATE: Sed Rate: 35 mm/hr — ABNORMAL HIGH (ref 0–16)

## 2021-08-08 MED ORDER — FUROSEMIDE 40 MG PO TABS
40.0000 mg | ORAL_TABLET | Freq: Every day | ORAL | 0 refills | Status: DC
  Filled 2021-08-08: qty 10, 10d supply, fill #0

## 2021-08-08 NOTE — ED Notes (Signed)
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  Reports he is spiraling and fast.  No known history of psych issues.  Also lost his cardiac meds that was given at [facility].

## 2021-08-08 NOTE — BH Assessment (Signed)
Pt transported from [facility] to [facility] due to active hallucinations after he was discharged. Pt reports he got out of jail yesterday after doing 30 days for breaking a window. Pt asked if he was having hallucinations in the ED and pt states "that was just a cover up". TTS asked who was covering it up and pt states "I'm not going to share that information with you. Pt denies SI, HI, AVH and substance use. Pt is routine.

## 2021-08-08 NOTE — ED Provider Notes (Signed)
Behavioral Health Urgent Care Medical Screening Exam  Patient Name: Daryl [name] MRN: [id number] Date of Evaluation: 08/08/21 Chief Complaint:   Diagnosis:  Final diagnoses:  Paranoia (HCC)    History of Present illness: Daryl [name] is a 60 y.o. male. Patient presents voluntarily to [facility] behavioral health for walk-in assessment.  Patient reports " I am ready to go, I would like to walk next-door and get my food stamp card."   Patient is assessed face-to-face by nurse practitioner.  He is seated in assessment area, no acute distress.  He is alert and oriented, minimally cooperative during assessment.   Daryl [name] states "I am ready to go, I do not want you calling my mother, she lies!"  Patient appears paranoid when attempting to discuss reasons for his mother driving him to the emergency department earlier today.  He denies any mental health needs.  He is not currently linked with outpatient psychiatry.  He denies any current medications.  He presents with anxious  mood, labile affect. He denies suicidal and homicidal ideations.  He denies history of suicide attempts, denies history of self-harm. He contracts verbally for safety with this Clinical research associate.  He has normal behavior.  He denies both auditory and visual hallucinations.  Patient is able to converse coherently with goal-directed thoughts and no distractibility or preoccupation.   Objectively there is no evidence of psychosis/mania or delusional thinking.  Daryl [name] resides in [place], alone.  He denies access to weapons.  He reports he is employed on a part-time basis in the [employer].  Patient endorses average sleep and appetite.  He denies alcohol and substance use.  Patient offered support and encouragement.  He declines any person to contact for collateral information at this time.    Psychiatric Specialty Exam  Presentation  General Appearance:Appropriate for Environment; Casual  Eye Contact:Good  Speech:Clear  and Coherent; Normal Rate  Speech Volume:Normal  Handedness:Right   Mood and Affect  Mood:Anxious  Affect:Labile   Thought Process  Thought Processes:Goal Directed; Linear  Descriptions of Associations:Intact  Orientation:Full (Time, Place and Person)  Thought Content:Paranoid Ideation  Diagnosis of Schizophrenia or Schizoaffective disorder in past: No  Duration of Psychotic Symptoms: Less than six months  Hallucinations:None  Ideas of Reference:None  Suicidal Thoughts:No  Homicidal Thoughts:No   Sensorium  Memory:Immediate Good; Recent Fair; Remote Fair  Judgment:Fair  Insight:Fair   Executive Functions  Concentration:Good  Attention Span:Good  Recall:Good  Fund of Knowledge:Good  Language:Good   Psychomotor Activity  Psychomotor Activity:Normal   Assets  Assets:Communication Skills; Housing; Resilience; Social Support   Sleep  Sleep:Good  Number of hours: No data recorded  No data recorded  Physical Exam: Physical Exam Vitals and nursing note reviewed.  Constitutional:      Appearance: Normal appearance. He is well-developed.  HENT:     Head: Normocephalic and atraumatic.     Nose: Nose normal.  Cardiovascular:     Rate and Rhythm: Normal rate.  Pulmonary:     Effort: Pulmonary effort is normal.  Musculoskeletal:        General: Normal range of motion.     Cervical back: Normal range of motion.  Skin:    General: Skin is warm and dry.  Neurological:     Mental Status: He is alert and oriented to person, place, and time.  Psychiatric:        Attention and Perception: Attention and perception normal.        Mood and Affect: Mood is anxious.  Affect is labile.        Speech: Speech normal.        Behavior: Behavior is cooperative.        Thought Content: Thought content is paranoid.        Cognition and Memory: Cognition and memory normal.   Review of Systems  Constitutional: Negative.   HENT: Negative.    Eyes: Negative.    Respiratory: Negative.    Cardiovascular: Negative.   Gastrointestinal: Negative.   Genitourinary: Negative.   Musculoskeletal: Negative.   Skin: Negative.   Neurological: Negative.   Endo/Heme/Allergies: Negative.   Psychiatric/Behavioral:  The patient is nervous/anxious.   Blood pressure (!) 137/91, pulse (!) 110, resp. rate 19, SpO2 99 %. There is no height or weight on file to calculate BMI.  Musculoskeletal: Strength & Muscle Tone: within normal limits Gait & Station: normal Patient leans: N/A   [facility] MSE Discharge Disposition for Follow up and Recommendations: Based on my evaluation the patient does not appear to have an emergency medical condition and can be discharged with resources and follow up care in outpatient services for Medication Management and Individual Therapy Patient reviewed with Dr. Bronwen Betters. Follow-up with outpatient psychiatry, resources provided. Follow-up with primary care provider, resources provided.  Patient is elevated blood pressure/ heart rate appears at baseline.  Patient seen and evaluated by emergency department provider prior to assessment at [facility] health.   [name], FNP 08/08/2021, 2:16 PM

## 2021-08-08 NOTE — ED Provider Notes (Addendum)
[facility] DEPT Provider Note   CSN: [id number] Arrival date & time: 08/08/21  [id number]     History Chief Complaint  Patient presents with   Leg Swelling   Joint Swelling    Daryl [name] is a 60 y.o. male.  60 year old male with prior medical history as detailed below presents for evaluation.  Patient complains of increased lower extremity edema.  Patient reports that he has previously been on Lasix.  He reports that he is not currently taking same.  He also complains of mild swelling to his left elbow.  He denies any specific injury.  He denies significant pain.  He denies recent fever.  Patient is comfortable during evaluation.  He is appropriately oriented.  He denies recent hallucinations or delusions.  He denies suicidality or homicidality.  The history is provided by the patient.  Illness Location:  Increased edema to both lower extremities, left elbow swelling Severity:  Moderate Onset quality:  Unable to specify Timing:  Unable to specify Progression:  Unable to specify Chronicity:  Recurrent     Past Medical History:  Diagnosis Date   Asthma    Chronic combined systolic and diastolic CHF (congestive heart failure) ([place])    Dyslipidemia    Essential hypertension 11/02/2018   ETOH abuse 02/14/2020   Nonobstructive CAD    Non-obstructive disease on cardiac cath in 08/2018   NSVT (nonsustained ventricular tachycardia)    PNA (pneumonia) 09/11/2018   Sepsis ([place]) 09/11/2018   Stage 3a chronic kidney disease ([place]) 02/19/2020   Tobacco abuse 09/11/2018    Patient Active Problem List   Diagnosis Date Noted   Alcohol abuse with intoxication delirium ([place])    Alcohol withdrawal delirium ([place]) 06/26/2021   Acute on chronic systolic CHF (congestive heart failure) ([place]) 06/23/2021   Stage 3a chronic kidney disease ([place]) 02/19/2020   ETOH abuse 02/14/2020   Essential hypertension 11/02/2018   Nonischemic cardiomyopathy (HCC)    Chronic  combined systolic and diastolic CHF (congestive heart failure) ([place])    Tobacco abuse 09/11/2018    Past Surgical History:  Procedure Laterality Date   IR THORACENTESIS ASP PLEURAL SPACE W/IMG GUIDE  09/13/2018   RIGHT/LEFT HEART CATH AND CORONARY ANGIOGRAPHY N/A 09/15/2018   Procedure: RIGHT/LEFT HEART CATH AND CORONARY ANGIOGRAPHY;  Surgeon: Leonie Man, MD;  Location: [facility];  Service: Cardiovascular;  Laterality: N/A;       History reviewed. No pertinent family history.  Social History   Tobacco Use   Smoking status: Every Day    Packs/day: 0.50    Types: Cigarettes   Smokeless tobacco: Never  Vaping Use   Vaping Use: Never used  Substance Use Topics   Alcohol use: Yes   Drug use: Never    Home Medications Prior to Admission medications   Medication Sig Start Date End Date Taking? Authorizing Provider  atorvastatin (LIPITOR) 40 MG tablet TAKE 1 TABLET (40 MG TOTAL) BY MOUTH DAILY AT 6 PM. Patient not taking: Reported on 06/02/2021 08/08/20 08/08/21  Minus Breeding, MD  carvedilol (COREG) 3.125 MG tablet Take 3.125 mg by mouth 2 (two) times daily with a meal.    [provider]  chlordiazePOXIDE (LIBRIUM) 5 MG capsule Take 3 tablets by mouth daily 3 times a day x1 day; then 2 tablets by mouth 3 times a day x2-day; then 1 tablet by mouth 3 times a day x2 days; then 1 tablet by mouth 2 times a day x2-day; then 1 tablet  by mouth daily x3 days and stop Librium. Patient not taking: Reported on 07/20/2021 07/05/21   Vassie Loll, MD  folic acid (FOLVITE) 1 MG tablet Take 1 tablet (1 mg total) by mouth daily. 07/05/21   Vassie Loll, MD  furosemide (LASIX) 40 MG tablet Take 1 tablet (40 mg total) by mouth daily. 07/05/21   Vassie Loll, MD  irbesartan (AVAPRO) 75 MG tablet Take 0.5 tablets (37.5 mg total) by mouth daily. 07/06/21   Vassie Loll, MD  metoprolol succinate (TOPROL-XL) 50 MG 24 hr tablet Take 1 tablet (50 mg total) by mouth daily. Take  with or immediately following a meal. 07/06/21   Vassie Loll, MD  potassium chloride SA (KLOR-CON) 20 MEQ tablet Take 2 tablets (40 mEq total) by mouth daily. 07/05/21   Vassie Loll, MD  spironolactone (ALDACTONE) 25 MG tablet Take 0.5 tablets (12.5 mg total) by mouth daily. 07/06/21   Vassie Loll, MD  thiamine 100 MG tablet Take 1 tablet (100 mg total) by mouth in the morning and at bedtime. 07/05/21   Vassie Loll, MD  valsartan (DIOVAN) 40 MG tablet Take 40 mg by mouth 2 (two) times daily.    [provider]    Allergies    Penicillins  Review of Systems   Review of Systems  All other systems reviewed and are negative.  Physical Exam Updated Vital Signs BP (!) 151/106 (BP Location: Right Arm)    Pulse 62    Temp 98 F (36.7 C) (Oral)    Resp 18    Ht 5\' 9"  (1.753 m)    Wt 86 kg    SpO2 99%    BMI 28.00 kg/m   Physical Exam Vitals and nursing note reviewed.  Constitutional:      General: He is not in acute distress.    Appearance: Normal appearance. He is well-developed.  HENT:     Head: Normocephalic and atraumatic.  Eyes:     Conjunctiva/sclera: Conjunctivae normal.     Pupils: Pupils are equal, round, and reactive to light.  Cardiovascular:     Rate and Rhythm: Normal rate and regular rhythm.     Heart sounds: Normal heart sounds.  Pulmonary:     Effort: Pulmonary effort is normal. No respiratory distress.     Breath sounds: Normal breath sounds.  Abdominal:     General: There is no distension.     Palpations: Abdomen is soft.     Tenderness: There is no abdominal tenderness.  Musculoskeletal:        General: No deformity. Normal range of motion.     Cervical back: Normal range of motion and neck supple.     Right lower leg: Edema present.     Left lower leg: Edema present.     Comments: Mild edema over the left elbow the olecranon process.  No significant erythema or inflammation noted.  Exam is most consistent with likely olecranon bursitis.   Skin:    General: Skin is warm and dry.  Neurological:     General: No focal deficit present.     Mental Status: He is alert and oriented to person, place, and time.    ED Results / Procedures / Treatments   Labs (all labs ordered are listed, but only abnormal results are displayed) Labs Reviewed  CBC WITH DIFFERENTIAL/PLATELET - Abnormal; Notable for the following components:      Result Value   Hemoglobin 12.8 (*)    All other components within normal limits  COMPREHENSIVE METABOLIC PANEL - Abnormal; Notable for the following components:   Glucose, Bld 118 (*)    Calcium 8.4 (*)    Albumin 3.4 (*)    AST 57 (*)    ALT 45 (*)    All other components within normal limits  SEDIMENTATION RATE - Abnormal; Notable for the following components:   Sed Rate 35 (*)    All other components within normal limits  C-REACTIVE PROTEIN - Abnormal; Notable for the following components:   CRP 4.1 (*)    All other components within normal limits  URINALYSIS, ROUTINE W REFLEX MICROSCOPIC - Abnormal; Notable for the following components:   Protein, ur 30 (*)    All other components within normal limits  BRAIN NATRIURETIC PEPTIDE - Abnormal; Notable for the following components:   B Natriuretic Peptide 2,353.1 (*)    All other components within normal limits  ETHANOL  RAPID URINE DRUG SCREEN, HOSP PERFORMED    EKG None  Radiology DG Elbow Complete Left  Result Date: 08/08/2021 CLINICAL DATA:  Swelling of the left elbow for several days, atraumatic EXAM: LEFT ELBOW - COMPLETE 3+ VIEW COMPARISON:  None. FINDINGS: Soft tissue swelling around an olecranon enthesophyte. No erosion or soft tissue calcification. No joint effusion or fracture. IMPRESSION: Posterior soft tissue swelling around an olecranon enthesophyte. Electronically Signed   By: Tiburcio Pea M.D.   On: 08/08/2021 08:51    Procedures Procedures   Medications Ordered in ED Medications - No data to display  ED Course  I  have reviewed the triage vital signs and the nursing notes.  Pertinent labs & imaging results that were available during my care of the patient were reviewed by me and considered in my medical decision making (see chart for details).    MDM Rules/Calculators/A&P                           MDM  MSE complete  Daryl [name] was evaluated in Emergency Department on 08/08/2021 for the symptoms described in the history of present illness. He was evaluated in the context of the global COVID-19 pandemic, which necessitated consideration that the patient might be at risk for infection with the SARS-CoV-2 virus that causes COVID-19. Institutional protocols and algorithms that pertain to the evaluation of patients at risk for COVID-19 are in a state of rapid change based on information released by regulatory bodies including the CDC and federal and state organizations. These policies and algorithms were followed during the patient's care in the ED.  Patient is presenting with complaint primarily of left elbow discomfort and chronic lower extremity edema.  Elbow exam is most consistent with likely olecranon bursitis.  Patient with lower extremity edema.  Patient reports that he has not been taking his diuretics as prescribed.  Patient does understand that he should make arrangements to follow-up with cardiology in the outpatient setting.  Patient is advised that we can prescribe a small amount of diuretics for him today.  The importance of close outpatient follow-up is repeatedly stressed  Patient was also advised to stop drinking alcohol.  Patient without evidence of acute medical pathology requires inpatient management.  Patient also without evidence of psychiatric derangement that requires further psych evaluation here in the ED.  He is not homicidal.  He is not suicidal.  He denies delusions or hallucinations. Additionally, he was offered TTS/Psych evaluation and he declined.  Strict return  precautions given and understood.  Final Clinical Impression(s) / ED Diagnoses Final diagnoses:  Leg edema  Olecranon bursitis of left elbow    Rx / DC Orders ED Discharge Orders          Ordered    furosemide (LASIX) 40 MG tablet  Daily        08/08/21 1209             Valarie Merino, MD 08/08/21 1210    Valarie Merino, MD 08/08/21 1253

## 2021-08-08 NOTE — ED Provider Notes (Signed)
Behavioral Health Urgent Care Medical Screening Exam  Patient Name: Daryl [name] MRN: [id number] Date of Evaluation: 08/08/21 Chief Complaint:   Diagnosis:  Final diagnoses:  Adjustment disorder, unspecified type    History of Present illness: Daryl [name] is a 60 y.o. male.  Patient presents to [facility] behavioral health for walk-in assessment for the second time this afternoon.  He reports he walked to the [employer] office to inquire about his food stamps.  He was made aware that his food stamps will not be delivered to him until approximately 08/31/2021.  Patient is much more clear and willing to participate in assessment than earlier this date.  He is forthcoming and actively seeking treatment at this time.  Nahuel states "I have a history of using cocaine," last cocaine use approximately 3 months ago.  He also indicates history of alcohol use disorder.  He reports consuming 1 to 2 cans of beer, last ingestion of alcohol approximately 3 months ago.  He remains committed to his sobriety and recovery.  He reports he would like to be placed in residential substance use treatment to "make sure I stay off of it."  Patient presents voluntarily to [facility] behavioral health for walk-in assessment.  Patient was assessed by this writer approximately 2 hours ago, discharged per his request.  Patient is assessed face-to-face by nurse practitioner.  He is seated in assessment area, no acute distress.  He is alert and oriented, pleasant and cooperative during assessment.   He presents with euthymic mood, congruent affect.  He continues to deny suicidal and homicidal ideations.  No history of suicide attempts, no history of nonsuicidal self-harm behavior. He easily contracts verbally for safety with this Clinical research associate.  Patient continues to deny auditory and visual hallucinations.  There is no evidence of delusional thought content and no indication that patient is responding to  internal stimuli.  Patient reports he has natural supports in the area including his mother, Eber Jones and his niece Thurston Hole as well as his adult son Vaughan Basta.  He reports his mother and niece are "mad at me right now because they said I need a job."  Patient offered support and encouragement.  He gives verbal consent to speak with his mother, Eber Jones or his niece Sloan Leiter or his son Vaughan Basta.  Attempted to call patient's mother, Lytle Michaels phone number [phone], unable to reach patient's mother and voicemail full. Attempted to reach patient's niece, Nyra Capes phone number [phone],  Spoke with niece, Raheda who verbalizes concern that patient does not follow up with primary care provider and does not maintain compliance with blood pressure medications as prescribed.  She feels certain the patient has not been compliant with medications as he is experienced bilateral pedal edema recently. Per Raheda patient has used cocaine for many years however he has not "been honest about it, he is always denied it" however the family was aware of the chronic substance use.  Spoke with patient's son, Einar Nolasco phone number [phone] who agrees with plan to have patient discharge and present to [facility] residential treatment on Monday 08/12/2021 at 7:45 AM.  Patient's son verbalizes willingness to provide transportation home today and to [facility] on Monday.   Psychiatric Specialty Exam  Presentation  General Appearance:Appropriate for Environment; Casual  Eye Contact:Good  Speech:Clear and Coherent; Normal Rate  Speech Volume:Normal  Handedness:Right   Mood and Affect  Mood:Euthymic  Affect:Appropriate; Congruent   Thought Process  Thought Processes:Coherent; Goal Directed; Linear  Descriptions  of Associations:Intact  Orientation:Full (Time, Place and Person)  Thought Content:Logical; WDL  Diagnosis of Schizophrenia or Schizoaffective disorder in past: No    Hallucinations:None  Ideas of Reference:None  Suicidal Thoughts:No  Homicidal Thoughts:No   Sensorium  Memory:Immediate Good; Recent Good; Remote Good  Judgment:Fair  Insight:Fair   Executive Functions  Concentration:Good  Attention Span:Good  Recall:Good  Fund of Knowledge:Good  Language:Good   Psychomotor Activity  Psychomotor Activity:Normal   Assets  Assets:Communication Skills; Desire for Improvement; Housing; Leisure Time; Resilience; Social Support   Sleep  Sleep:Good  Number of hours: No data recorded  No data recorded  Physical Exam: Physical Exam Vitals and nursing note reviewed.  Constitutional:      Appearance: Normal appearance. He is well-developed and normal weight.  HENT:     Head: Normocephalic and atraumatic.     Nose: Nose normal.  Cardiovascular:     Rate and Rhythm: Normal rate.  Pulmonary:     Effort: Pulmonary effort is normal.  Musculoskeletal:        General: Normal range of motion.  Skin:    General: Skin is warm and dry.  Neurological:     Mental Status: He is alert and oriented to person, place, and time.  Psychiatric:        Attention and Perception: Attention and perception normal.        Mood and Affect: Mood and affect normal.        Speech: Speech normal.        Behavior: Behavior normal. Behavior is cooperative.        Thought Content: Thought content normal.        Cognition and Memory: Cognition and memory normal.        Judgment: Judgment normal.   Review of Systems  Constitutional: Negative.   HENT: Negative.    Eyes: Negative.   Respiratory: Negative.    Cardiovascular: Negative.   Gastrointestinal: Negative.   Genitourinary: Negative.   Musculoskeletal: Negative.   Skin: Negative.   Neurological: Negative.   Endo/Heme/Allergies: Negative.   Psychiatric/Behavioral:  Positive for substance abuse.   Blood pressure (!) 138/92, pulse 74, temperature 98.7 F (37.1 C), temperature source Oral, resp.  rate 18, SpO2 90 %. There is no height or weight on file to calculate BMI.  Musculoskeletal: Strength & Muscle Tone: within normal limits Gait & Station: normal Patient leans: N/A   [facility] MSE Discharge Disposition for Follow up and Recommendations: Based on my evaluation the patient does not appear to have an emergency medical condition and can be discharged with resources and follow up care in outpatient services for Medication Management and Individual Therapy Patient reviewed with Dr. Bronwen Betters. Follow-up with outpatient psychiatry at [facility] behavioral health. Follow-up with substance abuse treatment resources provided.   [name], FNP 08/08/2021, 5:39 PM

## 2021-08-08 NOTE — ED Notes (Signed)
Discharge instructions provided and Pt stated understanding. Pt alert, orient and ambulatory prior to d/c from facility. Personal belongings returned. Pt escorted to the front lobby to d/c from facility and to go home with a friend. Safety maintained.

## 2021-08-08 NOTE — ED Notes (Signed)
Pt given his belongings back from security.  Given AVS and follow up instructions.  No distress noted.

## 2021-08-08 NOTE — Discharge Instructions (Addendum)
Return for any problem.   It is imperative that you follow up closely with your routine outpatient care - contact Dr. Antoine Poche (Cardiology) for outpatient appointment.  Don't drink alcohol.

## 2021-08-08 NOTE — Discharge Instructions (Signed)

## 2021-08-08 NOTE — Discharge Instructions (Addendum)
[facility] Address: [address], [place] [address]  Phone: [phone] - Please arrive to [facility] Monday 08/12/21 at 7:45am.  TO ALL TEAM MEMBERS                     ITEMS CLIENTS CANNOT BRING INTO TREATMENT  No pillows, no blankets, towels, bath cloths, no perfumes, no colognes, no aerosol sprays of any kind, No tweezers, no manicuring tools. (Facility provides [employer], toenail and fingernail clippers). No eyelash glue, no fake eyelashes, no excessive amounts of makeup, no excessive amounts of toiletries, only 3 pair of shoes (1 pair must be atheletic), 1 pair of slides, 1 pair of bedroom slippers No half shirts, crop tops, tube tops, halter tops, spaghetti strap shirts, tank tops (wife beaters), and cut-offs; No leggings, jeggings, yoga pants, dresses, and/or skirts are allowed; No clothing advertising sex, drugs, alcohol, tobacco, or violence, political, or religious symbols. No torn clothing such as jeans with holes or torn knee areas, or that contain excessively large holes or cuts in them.  No gang identified clothing or clothing advertising negative role models. No high heels, dresses, skirts, no short shorts Shorts must be at least knee length or longer than the client's fingertips (male and male) No perms, no hair coloring solutions, magic shave, no over the counter medicated powders or ointments, or facial cleansers.  No laundry detergents of any kind (we supply detergent for sensitive skin)  ITEMS YOU CAN BRING  14 SHIRTS/BLOUSES 14 PAIR OF UNDERWEAR/PANTIES 3 PAIR OF SHOES WITH ONE PAIR OF ATHLETIC SHOES 14 PAIR OF PANTS/SHORTS (MUST BE LONGER THATN THE TIPS OF YOUR FINGERS) INCLUDED IN THE NUMBER 14 PAIR OF SOCKS TOILETRIES-2 DEODORANTS, 2 SHAMPOOS, 2 CONDITIONERS, 2 TOOTHBRUSHES HAIR CLIPPERS, RAZORS, BEARD TRIMMERS BRAS AT LEAST 2 OR MORE HARD CANDY ONLY NICOTINE PATCHES (NO GUM OR LOSENGES)    Patient is instructed prior to discharge to:  Take all medications as prescribed by his/her mental healthcare provider. Report any adverse effects and or reactions from the medicines to his/her outpatient provider promptly. Keep all scheduled appointments, to ensure that you are getting refills on time and to avoid any interruption in your medication.  If you are unable to keep an appointment call to reschedule.  Be sure to follow-up with resources and follow-up appointments provided.  Patient has been instructed & cautioned: To not engage in alcohol and or illegal drug use while on prescription medicines. In the event of worsening symptoms, patient is instructed to call the crisis hotline, 911 and or go to the nearest ED for appropriate evaluation and treatment of symptoms. To follow-up with his/her primary care provider for your other medical issues, concerns and or health care needs.

## 2021-08-08 NOTE — BH Assessment (Signed)
Pt reports AH of hearing ghost and VH of seeing dogs and demons. Denies SI, HI, AVH and substance use. This is pt second [facility] visit today.   Pt is routine.

## 2021-08-08 NOTE — ED Triage Notes (Signed)
Patient complains of lower extremity edema for the past 3 weeks and L elbow swelling. 2+ pitting edema in LE, elbow swollen, fluctuant area, reports tenderness to palpation. Disordered thoughts.

## 2021-08-08 NOTE — ED Notes (Signed)
Discharge instructions provided and Pt stated understanding. Pt alert, orient and ambulatory prior to d/c from facility. Personal belongings returned. Safety maintained.  

## 2021-08-08 NOTE — ED Notes (Signed)
Pt started to have hallucinations about spirits on the floor. Md made aware. Pt agreed to go to [facility]. Spoke to Child psychotherapist and called safe transport for patient.   Spoke to safe transport and will come pick patient up. Pt requesting to wait outside for transport.

## 2021-08-08 NOTE — Progress Notes (Addendum)
CSW received request from Doran Heater, FNP to assist pt with Residential treatment. CSW coordinated appointment with [facility] and spoke with Ladean Raya. Burns verified [employer] and confirmed that pt meet qualifications. Burns emailed CSW instructions on what pt can bring and not bring to their facility. CSW added discharge instructions and listed [facility] appointment scheduled for Monday 08/12/21 at 7:45am in pt's AVS.     Maryjean Ka, MSW, [facility] 08/08/2021 5:18 PM

## 2021-08-08 NOTE — Progress Notes (Signed)
CSW was asked by pt's RN to provide transportation to [facility], CSW requested a consult to be put in. RN stated she spoke with safe transport. CSW was unable to provide assistance as consult was never put in prior to pt's d/c .   Valentina Shaggy.Channing Yeager, MSW, LCSWA [facility]   Transitions of Care Clinical Social Worker I Direct Dial: [phone]   Fax: [phone] [email or web address]

## 2021-08-08 NOTE — ED Provider Notes (Signed)
Emergency Medicine Provider Triage Evaluation Note  Daryl [name] , a 60 y.o. male  was evaluated in triage.  Pt complains of bilateral lower leg edema for 3 to 4 weeks and elbow pain for an unknown duration.  Patient reports he got a letter telling him to "come and get checked out and do what he needs to do".  Does not appear to be responding to any internal stimuli.  Review of Systems  Positive: Bilateral leg swelling, left elbow swelling Negative: Chest pain, shortness of breath, fever  Physical Exam  BP (!) 151/106 (BP Location: Right Arm)    Pulse 62    Temp 98 F (36.7 C) (Oral)    Resp 18    Ht 5\' 9"  (1.753 m)    Wt 86 kg    SpO2 99%    BMI 28.00 kg/m  Gen:   Awake, no distress, tangential speech Resp:  Normal effort  MSK:   Moves extremities without difficulty. Bilateral 2+ pitting edema to lower extremities bilaterally. Fluctuance noted over the left elbow. Other:    Medical Decision Making  Medically screening exam initiated at 8:08 AM.  Appropriate orders placed.  was informed that the remainder of the evaluation will be completed by another provider, this initial triage assessment does not replace that evaluation, and the importance of remaining in the ED until their evaluation is complete.  Tangential speech.  After chart review, the patient has a history of CHF and alcohol use and withdrawal with delirium.  Patient not cooperative with interview.   [name], PA-C 08/08/21 08/10/21    [id number], MD 08/09/21 [phone]

## 2021-08-08 NOTE — ED Notes (Addendum)
Pt ready for discharge. Pt verbalizes understanding of discharge instructions and follow up.   Called pts mother and niece to come pick him up. Mother states pt is homeless and needs pyschiatric help. Mother states " he says things that are not so". Md made aware.   Pt examined by MD. Pt denies SI or HI. No hallucinations present while in ED. Pt denies hearing voices. Pt denies needing help.  Pt reports he is not homeless and lives in [place].   This Nurse offered to send pt to [facility] via safe transport. Pt refused at this time.   Attempted to call pts mother back. No answer. Niece did not answer as well.

## 2021-08-09 ENCOUNTER — Other Ambulatory Visit: Payer: Self-pay | Admitting: Cardiology

## 2021-08-09 ENCOUNTER — Other Ambulatory Visit: Payer: Self-pay

## 2021-08-12 ENCOUNTER — Other Ambulatory Visit: Payer: Self-pay

## 2021-08-12 ENCOUNTER — Telehealth: Payer: Self-pay | Admitting: Cardiology

## 2021-08-12 MED ORDER — ATORVASTATIN CALCIUM 40 MG PO TABS
ORAL_TABLET | Freq: Every day | ORAL | 3 refills | Status: DC
  Filled 2021-08-12: qty 90, 90d supply, fill #0

## 2021-08-12 NOTE — Telephone Encounter (Signed)
Patient's son calling for an updated medication list and prescriptions. He states the patient is going to [facility] and before they can admit him they need a correct medication list and they need all his medications. He states the patient is only supposed to be at this facility for 30 days.

## 2021-08-12 NOTE — Telephone Encounter (Signed)
Spoke with patient's son Vaughan Basta. He requested a medication list for his father. I explained that our medication list cannot be verified and suggested that he bring his father in to see Dr. Antoine Poche.I  recommended that he get an updated list of medications from his pharmacist and to bring all of his father's medications to his appointment with Dr. Antoine Poche on January 6th at 10:30 am. Son voiced understanding.

## 2021-08-15 ENCOUNTER — Emergency Department (HOSPITAL_COMMUNITY): Payer: Medicaid Other

## 2021-08-15 ENCOUNTER — Emergency Department (HOSPITAL_COMMUNITY)
Admission: EM | Admit: 2021-08-15 | Discharge: 2021-08-15 | Disposition: A | Discharge status: Home / Self Care | Payer: Medicaid Other | Attending: Emergency Medicine | Admitting: Emergency Medicine

## 2021-08-15 ENCOUNTER — Encounter: Payer: Self-pay | Admitting: Nurse Practitioner

## 2021-08-15 ENCOUNTER — Other Ambulatory Visit: Payer: Self-pay

## 2021-08-15 ENCOUNTER — Telehealth (INDEPENDENT_AMBULATORY_CARE_PROVIDER_SITE_OTHER): Payer: Medicaid Other | Admitting: Nurse Practitioner

## 2021-08-15 DIAGNOSIS — J45909 Unspecified asthma, uncomplicated: Secondary | ICD-10-CM | POA: Diagnosis not present

## 2021-08-15 DIAGNOSIS — I5042 Chronic combined systolic (congestive) and diastolic (congestive) heart failure: Secondary | ICD-10-CM | POA: Diagnosis not present

## 2021-08-15 DIAGNOSIS — F419 Anxiety disorder, unspecified: Secondary | ICD-10-CM | POA: Diagnosis not present

## 2021-08-15 DIAGNOSIS — I130 Hypertensive heart and chronic kidney disease with heart failure and stage 1 through stage 4 chronic kidney disease, or unspecified chronic kidney disease: Secondary | ICD-10-CM | POA: Diagnosis not present

## 2021-08-15 DIAGNOSIS — R202 Paresthesia of skin: Secondary | ICD-10-CM | POA: Diagnosis not present

## 2021-08-15 DIAGNOSIS — Y900 Blood alcohol level of less than 20 mg/100 ml: Secondary | ICD-10-CM | POA: Insufficient documentation

## 2021-08-15 DIAGNOSIS — R000 Tachycardia, unspecified: Secondary | ICD-10-CM | POA: Insufficient documentation

## 2021-08-15 DIAGNOSIS — N183 Chronic kidney disease, stage 3 unspecified: Secondary | ICD-10-CM | POA: Diagnosis not present

## 2021-08-15 DIAGNOSIS — F17210 Nicotine dependence, cigarettes, uncomplicated: Secondary | ICD-10-CM | POA: Insufficient documentation

## 2021-08-15 DIAGNOSIS — Z7689 Persons encountering health services in other specified circumstances: Secondary | ICD-10-CM | POA: Diagnosis not present

## 2021-08-15 DIAGNOSIS — J90 Pleural effusion, not elsewhere classified: Secondary | ICD-10-CM | POA: Insufficient documentation

## 2021-08-15 DIAGNOSIS — J181 Lobar pneumonia, unspecified organism: Secondary | ICD-10-CM | POA: Diagnosis not present

## 2021-08-15 DIAGNOSIS — Z79899 Other long term (current) drug therapy: Secondary | ICD-10-CM | POA: Insufficient documentation

## 2021-08-15 DIAGNOSIS — J189 Pneumonia, unspecified organism: Secondary | ICD-10-CM

## 2021-08-15 DIAGNOSIS — R079 Chest pain, unspecified: Secondary | ICD-10-CM | POA: Diagnosis present

## 2021-08-15 LAB — TROPONIN I (HIGH SENSITIVITY)
Troponin I (High Sensitivity): 24 ng/L — ABNORMAL HIGH (ref <18)
Troponin I (High Sensitivity): 24 ng/L — ABNORMAL HIGH (ref <18)

## 2021-08-15 LAB — CBC WITH DIFFERENTIAL/PLATELET
Abs Immature Granulocytes: 0.03 10*3/uL (ref 0.00–0.07)
Basophils Absolute: 0 10*3/uL (ref 0.0–0.1)
Basophils Relative: 1 %
Eosinophils Absolute: 0.1 10*3/uL (ref 0.0–0.5)
Eosinophils Relative: 1 %
HCT: 41 % (ref 39.0–52.0)
Hemoglobin: 12.8 g/dL — ABNORMAL LOW (ref 13.0–17.0)
Immature Granulocytes: 0 %
Lymphocytes Relative: 30 %
Lymphs Abs: 2.7 10*3/uL (ref 0.7–4.0)
MCH: 27.6 pg (ref 26.0–34.0)
MCHC: 31.2 g/dL (ref 30.0–36.0)
MCV: 88.4 fL (ref 80.0–100.0)
Monocytes Absolute: 0.9 10*3/uL (ref 0.1–1.0)
Monocytes Relative: 10 %
Neutro Abs: 5.2 10*3/uL (ref 1.7–7.7)
Neutrophils Relative %: 58 %
Platelets: 497 10*3/uL — ABNORMAL HIGH (ref 150–400)
RBC: 4.64 MIL/uL (ref 4.22–5.81)
RDW: 16.1 % — ABNORMAL HIGH (ref 11.5–15.5)
WBC: 8.8 10*3/uL (ref 4.0–10.5)
nRBC: 0.7 % — ABNORMAL HIGH (ref 0.0–0.2)

## 2021-08-15 LAB — BASIC METABOLIC PANEL
Anion gap: 8 (ref 5–15)
BUN: 20 mg/dL (ref 6–20)
CO2: 29 mmol/L (ref 22–32)
Calcium: 8.7 mg/dL — ABNORMAL LOW (ref 8.9–10.3)
Chloride: 102 mmol/L (ref 98–111)
Creatinine, Ser: 1.15 mg/dL (ref 0.61–1.24)
GFR, Estimated: >60 mL/min (ref >60)
Glucose, Bld: 106 mg/dL — ABNORMAL HIGH (ref 70–99)
Potassium: 3.9 mmol/L (ref 3.5–5.1)
Sodium: 139 mmol/L (ref 135–145)

## 2021-08-15 LAB — ETHANOL: Alcohol, Ethyl (B): <10 mg/dL (ref <10)

## 2021-08-15 MED ORDER — SPIRONOLACTONE 25 MG PO TABS: 12.5000 mg | ORAL_TABLET | Freq: Every day | ORAL | 0 refills | Status: DC

## 2021-08-15 MED ORDER — IRBESARTAN 75 MG PO TABS: 37.5000 mg | ORAL_TABLET | Freq: Every day | ORAL | 0 refills | Status: DC

## 2021-08-15 MED ORDER — POTASSIUM CHLORIDE ER 20 MEQ PO TBCR: 40.0000 meq | EXTENDED_RELEASE_TABLET | Freq: Every day | ORAL | 0 refills | Status: DC

## 2021-08-15 MED ORDER — FUROSEMIDE 40 MG PO TABS
40.0000 mg | ORAL_TABLET | Freq: Every day | ORAL | 0 refills | Status: DC
  Filled 2021-08-15: qty 30, 30d supply, fill #0

## 2021-08-15 MED ORDER — FOLIC ACID 1 MG PO TABS
1.0000 mg | ORAL_TABLET | Freq: Every day | ORAL | 0 refills | Status: AC
  Filled 2021-08-15: qty 30, 30d supply, fill #0

## 2021-08-15 MED ORDER — METOPROLOL TARTRATE 50 MG PO TABS: 50.0000 mg | ORAL_TABLET | Freq: Two times a day (BID) | ORAL | 0 refills | Status: DC

## 2021-08-15 MED ORDER — LEVOFLOXACIN 750 MG PO TABS: 750.0000 mg | ORAL_TABLET | Freq: Every day | ORAL | 0 refills | Status: AC

## 2021-08-15 MED ORDER — FOLIC ACID 1 MG PO TABS: 1.0000 mg | ORAL_TABLET | Freq: Every day | ORAL | 0 refills | Status: DC

## 2021-08-15 MED ORDER — FUROSEMIDE 40 MG PO TABS: 40.0000 mg | ORAL_TABLET | Freq: Every day | ORAL | 0 refills | Status: DC

## 2021-08-15 MED ORDER — THIAMINE HCL 100 MG PO TABS: 100.0000 mg | ORAL_TABLET | Freq: Two times a day (BID) | ORAL | 0 refills | Status: DC

## 2021-08-15 MED ORDER — THIAMINE HCL 100 MG PO TABS
100.0000 mg | ORAL_TABLET | Freq: Two times a day (BID) | ORAL | 0 refills | Status: AC
  Filled 2021-08-15: qty 60, 30d supply, fill #0

## 2021-08-15 MED ORDER — SPIRONOLACTONE 25 MG PO TABS
12.5000 mg | ORAL_TABLET | Freq: Every day | ORAL | 0 refills | Status: DC
  Filled 2021-08-15: qty 15, 30d supply, fill #0

## 2021-08-15 MED ORDER — METOPROLOL SUCCINATE ER 50 MG PO TB24
50.0000 mg | ORAL_TABLET | Freq: Every day | ORAL | 0 refills | Status: DC
  Filled 2021-08-15: qty 30, 30d supply, fill #0

## 2021-08-15 MED ORDER — LEVOFLOXACIN 750 MG PO TABS
750.0000 mg | ORAL_TABLET | Freq: Once | ORAL | Status: AC
  Administered 2021-08-15: 23:00:00 750 mg via ORAL
  Filled 2021-08-15: qty 1

## 2021-08-15 MED ORDER — ATORVASTATIN CALCIUM 40 MG PO TABS
40.0000 mg | ORAL_TABLET | Freq: Every day | ORAL | 0 refills | Status: DC
  Filled 2021-08-15: qty 30, 30d supply, fill #0

## 2021-08-15 MED ORDER — ATORVASTATIN CALCIUM 40 MG PO TABS: 40.0000 mg | ORAL_TABLET | Freq: Every day | ORAL | 0 refills | Status: DC

## 2021-08-15 MED ORDER — POTASSIUM CHLORIDE CRYS ER 20 MEQ PO TBCR
40.0000 meq | EXTENDED_RELEASE_TABLET | Freq: Every day | ORAL | 0 refills | Status: DC
  Filled 2021-08-15: qty 60, 30d supply, fill #0

## 2021-08-15 MED ORDER — IRBESARTAN 75 MG PO TABS
37.5000 mg | ORAL_TABLET | Freq: Every day | ORAL | 0 refills | Status: DC
Start: 2021-08-15 — End: 2021-09-05
  Filled 2021-08-15: qty 15, 30d supply, fill #0

## 2021-08-15 NOTE — ED Provider Notes (Signed)
[facility] EMERGENCY DEPARTMENT Provider Note   CSN: [id number] Arrival date & time: 08/15/21  1715     History Chief Complaint  Patient presents with   Anxiety    Daryl [name] is a 60 y.o. male with history significant for congestive heart failure, asthma, hypertension, hyperlipidemia, and alcohol abuse who presents here for symptoms of chest pain which he attributes to anxiety.  Interview was very difficult with patient with inconsistent history and description of symptoms.  He endorses chest pain described as "unable to breathe" that has been happening intermittently over the last few days to weeks.  Patient is unable to give a consistent timeline of his symptoms.  Patient was seen via telehealth today by family med to establish care, and per chart review interview questions were answered by patient's son who is trying to get patient admitted to a rehab facility for his alcohol abuse.  Patient endorses some tingling of his left arm.  He denies fevers, chills, cough, shortness of breath, nausea vomiting and diarrhea.  Overall patient is poor historian.  Anxiety Associated symptoms include chest pain. Pertinent negatives include no abdominal pain, no headaches and no shortness of breath.      Past Medical History:  Diagnosis Date   Asthma    Chronic combined systolic and diastolic CHF (congestive heart failure) ([place])    Dyslipidemia    Essential hypertension 11/02/2018   ETOH abuse 02/14/2020   Nonobstructive CAD    Non-obstructive disease on cardiac cath in 08/2018   NSVT (nonsustained ventricular tachycardia)    PNA (pneumonia) 09/11/2018   Sepsis ([place]) 09/11/2018   Stage 3a chronic kidney disease ([place]) 02/19/2020   Tobacco abuse 09/11/2018    Patient Active Problem List   Diagnosis Date Noted   Encounter to establish care 08/15/2021   Alcohol abuse with intoxication delirium ([place])    Alcohol withdrawal delirium ([place]) 06/26/2021   Acute on chronic  systolic CHF (congestive heart failure) ([place]) 06/23/2021   Stage 3a chronic kidney disease ([place]) 02/19/2020   ETOH abuse 02/14/2020   Essential hypertension 11/02/2018   Nonischemic cardiomyopathy ([facility])    Chronic combined systolic and diastolic CHF (congestive heart failure) ([place])    Tobacco abuse 09/11/2018    Past Surgical History:  Procedure Laterality Date   IR THORACENTESIS ASP PLEURAL SPACE W/IMG GUIDE  09/13/2018   RIGHT/LEFT HEART CATH AND CORONARY ANGIOGRAPHY N/A 09/15/2018   Procedure: RIGHT/LEFT HEART CATH AND CORONARY ANGIOGRAPHY;  Surgeon: Leonie Man, MD;  Location: [facility];  Service: Cardiovascular;  Laterality: N/A;       No family history on file.  Social History   Tobacco Use   Smoking status: Every Day    Packs/day: 0.50    Types: Cigarettes   Smokeless tobacco: Never  Vaping Use   Vaping Use: Never used  Substance Use Topics   Alcohol use: Yes   Drug use: Never    Home Medications Prior to Admission medications   Medication Sig Start Date End Date Taking? Authorizing Provider  atorvastatin (LIPITOR) 40 MG tablet Take 1 tablet (40 mg total) by mouth daily. 08/15/21 09/14/21  Fenton Foy, NP  folic acid (FOLVITE) 1 MG tablet Take 1 tablet (1 mg total) by mouth daily. 08/15/21 09/14/21  Fenton Foy, NP  furosemide (LASIX) 40 MG tablet Take 1 tablet (40 mg total) by mouth daily. 08/15/21 09/14/21  Fenton Foy, NP  irbesartan (AVAPRO) 75 MG tablet Take 0.5 tablets (37.5  mg total) by mouth daily. 08/15/21 09/14/21  Fenton Foy, NP  metoprolol succinate (TOPROL-XL) 50 MG 24 hr tablet Take 1 tablet (50 mg total) by mouth daily. Take with or immediately following a meal. 08/15/21 09/14/21  Fenton Foy, NP  potassium chloride SA (KLOR-CON M) 20 MEQ tablet Take 2 tablets (40 mEq total) by mouth daily. 08/15/21 09/14/21  Fenton Foy, NP  spironolactone (ALDACTONE) 25 MG tablet Take 0.5 tablets (12.5 mg total) by mouth daily.  08/15/21 09/14/21  Fenton Foy, NP  thiamine 100 MG tablet Take 1 tablet (100 mg total) by mouth in the morning and at bedtime. 08/15/21 09/14/21  Fenton Foy, NP    Allergies    Penicillins  Review of Systems   Review of Systems  Constitutional:  Negative for fever.  HENT: Negative.    Eyes: Negative.   Respiratory:  Negative for shortness of breath.   Cardiovascular:  Positive for chest pain.  Gastrointestinal:  Negative for abdominal pain, nausea and vomiting.  Endocrine: Negative.   Genitourinary: Negative.   Musculoskeletal: Negative.   Skin:  Negative for rash.  Neurological:  Negative for numbness and headaches.  Psychiatric/Behavioral:  The patient is nervous/anxious.   All other systems reviewed and are negative.  Physical Exam Updated Vital Signs BP 112/86 (BP Location: Right Arm)    Pulse (!) 103    Temp 97.8 F (36.6 C) (Oral)    Resp 16    SpO2 100%   Physical Exam Vitals and nursing note reviewed.  Constitutional:      General: He is not in acute distress.    Appearance: He is not ill-appearing.  HENT:     Head: Atraumatic.  Eyes:     Conjunctiva/sclera: Conjunctivae normal.  Cardiovascular:     Rate and Rhythm: Regular rhythm. Tachycardia present.     Pulses: Normal pulses.     Heart sounds: No murmur heard. Pulmonary:     Effort: Pulmonary effort is normal. No respiratory distress.     Breath sounds: Normal breath sounds.  Abdominal:     General: Abdomen is flat. There is no distension.     Palpations: Abdomen is soft.     Tenderness: There is no abdominal tenderness.  Musculoskeletal:        General: Normal range of motion.     Cervical back: Normal range of motion.  Skin:    General: Skin is warm and dry.     Capillary Refill: Capillary refill takes less than 2 seconds.  Neurological:     General: No focal deficit present.     Mental Status: He is alert.  Psychiatric:     Comments: Frequently changing answers, unable to give  consistent history    ED Results / Procedures / Treatments   Labs (all labs ordered are listed, but only abnormal results are displayed) Labs Reviewed  BASIC METABOLIC PANEL  CBC WITH DIFFERENTIAL/PLATELET  TROPONIN I (HIGH SENSITIVITY)    EKG None  Radiology DG Chest 2 View  Result Date: 08/15/2021 CLINICAL DATA:  Chest pain EXAM: CHEST - 2 VIEW COMPARISON:  06/28/2021 FINDINGS: Interval development of right basilar consolidation and small right pleural effusion. This may reflect a focal pneumonic infiltrate and associated small parapneumonic effusion in the appropriate clinical setting. No pneumothorax. Left lung is clear. Cardiac size within normal limits. Pulmonary vascularity is normal. No acute bone abnormality. IMPRESSION: Right basilar consolidation and small associated right pleural effusion Electronically Signed   By: Cassandria Anger  Ramiro Harvest M.D.   On: 08/15/2021 19:42    Procedures Procedures   Medications Ordered in ED Medications  levofloxacin (LEVAQUIN) tablet 750 mg (750 mg Oral Given 08/15/21 2324)    ED Course  I have reviewed the triage vital signs and the nursing notes.  Pertinent labs & imaging results that were available during my care of the patient were reviewed by me and considered in my medical decision making (see chart for details).    MDM Rules/Calculators/A&P                         This patient presents to the ED for concern of chest pain, this involves an extensive number of treatment options, and is a complaint that carries with it a high risk of complications and morbidity.  The differential diagnosis includes The emergent differential diagnosis of chest pain includes: Acute coronary syndrome, pericarditis, aortic dissection, pulmonary embolism, tension pneumothorax, and esophageal rupture.  I do not believe the patient has an emergent cause of chest pain, other urgent/non-acute considerations include, but are not limited to: chronic angina, aortic  stenosis, cardiomyopathy, myocarditis, mitral valve prolapse, pulmonary hypertension, hypertrophic obstructive cardiomyopathy (HOCM), aortic insufficiency, right ventricular hypertrophy, pneumonia, pleuritis, bronchitis, pneumothorax, tumor, gastroesophageal reflux disease (GERD), esophageal spasm, Mallory-Weiss syndrome, peptic ulcer disease, biliary disease, pancreatitis, functional gastrointestinal pain, cervical or thoracic disk disease or arthritis, shoulder arthritis, costochondritis, subacromial bursitis, anxiety or panic attack, herpes zoster, breast disorders, chest wall tumors, thoracic outlet syndrome, mediastinitis.   Additional history obtained:  Additional history obtained from patient's friend, Galen Daft, who I spoke with on the phone after patient's xray results returned with evidence of pneumonia. Patient is homeless, and per friend, she brought him to the ED for a mental health evaluation as he seemed agitated and was "talking nonsense, saying that he was going to work for [employer]." She states that he has no known mental health diagnosis, however, he has substance addiction to alcohol and potentially other drugs as well He has also been in ED for mental health issues recently as well.  Pt and friend deny SI/HI.  External records from outside source obtained and reviewed including outside records from a new family care doctor that he had a telehealth appt with earlier today. He had all necessary heart medications for his previously diagnosed congestive heart failure renewed and sent over the [facility], although patient has not yet filled them.   Lab Tests:  I Ordered, reviewed, and interpreted labs.  The pertinent results include:  troponin mildly elevated at baseline, BMP and CBC wnl, ethanol negative   Imaging Studies ordered:  I ordered imaging studies including chest xray  I independently visualized and interpreted imaging which showed right sided basilar consolidation  with small pleural effusion. This is new since his most recent xray on 06/28/21 I agree with the radiologist interpretation     Medicines ordered and prescription drug management:  I ordered medication including Levaquin  for pneumonia. Patient unable to fill prescription tonight and compliance is a concern, so first dose given here in ED.  Reevaluation of the patient after these medicines showed that the patient  is unchanged, which is expected I have reviewed the patients home medicines and have made adjustments as needed     Consultations Obtained:  I requested consultation with the care management team,  and discussed lab and imaging findings as well as pertinent plan - they spoke with patient and found that patient does  have insurance and has new family doctor to obtain repeat chest xray in two days. Since he does not have a place to go tonight, they referred him to the shelter. Patient was unhappy with this decision, and also expresses concern that he won't be able to pick up his other prescription at the community center tomorrow if he does not have a ride. Social work recommend that I print all of his new prescriptions that his family doctor sent in earlier today so that patient can fill his medications at any pharmacy of his choosing. She also provided a copy of his insurance card.     Dispostion:  After consideration of the diagnostic results and the patients response to treatment feel that the patent would benefit from discharge with outpatient follow up.   Right sided pneumonia with pleural effusion - Pt given first dose of levaquin here in ED with printed rx for him to fill tomorrow. All questions asked and answered. He is homeless and social work provided resources for him to stay tonight. Pt understanding and agrees to plan.    Final Clinical Impression(s) / ED Diagnoses Final diagnoses:  Community acquired pneumonia of right lower lobe of lung  Pleural effusion    Rx /  DC Orders ED Discharge Orders          Ordered    levofloxacin (LEVAQUIN) 750 MG tablet  Daily        08/15/21 2303    atorvastatin (LIPITOR) 40 MG tablet  Daily        08/15/21 2303    spironolactone (ALDACTONE) 25 MG tablet  Daily        [id number]    folic acid (FOLVITE) 1 MG tablet  Daily        08/15/21 2303    furosemide (LASIX) 40 MG tablet  Daily        08/15/21 2303    metoprolol tartrate (LOPRESSOR) 50 MG tablet  2 times daily        08/15/21 2303    potassium chloride 20 MEQ TBCR  Daily        08/15/21 2303    thiamine 100 MG tablet  2 times daily        08/15/21 2303    irbesartan (AVAPRO) 75 MG tablet  Daily        08/15/21 2303             Tonye Pearson, [place] 08/15/21 2339    Lucrezia Starch, MD 08/16/21 1626

## 2021-08-15 NOTE — ED Notes (Signed)
Pt given discharge paperwork and medication. Pt states understanding. Gave taxi voucher to staff in lobby

## 2021-08-15 NOTE — Patient Instructions (Addendum)
Establish care Medication Reconciliation:  Medication list updated per recent discharge medication lists from recent hospital visits  Please keep upcoming appointment with cardiology   Will fax AVS per patient request to:  [facility] residential recovery center Attn: Jamesetta So [phone]  Follow up:  Follow up in 1-2 weeks with Dr. Andrey Campanile

## 2021-08-15 NOTE — Progress Notes (Signed)
Virtual Visit via Telephone Note  I connected with Daryl [name] on 08/15/21 at  9:00 AM EST by telephone and verified that I am speaking with the correct person using two identifiers.  Location: Patient: home Provider: office   I discussed the limitations, risks, security and privacy concerns of performing an evaluation and management service by telephone and the availability of in person appointments. I also discussed with the patient that there may be a patient responsible charge related to this service. The patient expressed understanding and agreed to proceed.   History of Present Illness:  60 year old male with history of cardiomyopathy, hypertension, CHF, alcohol withdrawal, alcohol abuse.  Patient requested this appointment today for medication list update and refills.  Patient's son is providing history for the patient today.  He states that he is trying to get his father into a rehab facility for recovery from alcoholism.  Patient has not had a primary care physician.  He has been followed by cardiology and was last seen by Dr. Antoine Poche in September 2022.  Patient called the cardiology office and asked for an updated medication list but the cardiology office would not provide this for him.  The patient has been seen in the ED and admitted to the hospital several times since his last visit with cardiology.  We will try to update his medication list per recent hospital visits.  We will give a 1 month supply of his medications but patient must follow-up with his scheduled appointment for cardiology on August 30, 2021 for medication management.  Patient is requesting that a updated medication list be sent to [facility] residential recovery center.  Patient was recently in the hospital around 2 weeks ago and apparently he was unable to get his medications at discharge.  His son states that he has only taken Lasix and Lipitor at this time. Denies f/c/s, n/v/d, hemoptysis, PND, chest pain or edema.     Observations/Objective:  Vitals with BMI 08/08/2021 08/08/2021 08/08/2021  Height - - -  Weight - - -  BMI - - -  Systolic - 138 133  Diastolic - 92 102  Pulse 74 94 109      Assessment and Plan:  Establish care Medication Reconciliation:  Medication list updated per recent discharge medication lists from recent hospital visits  Please keep upcoming appointment with cardiology   Will fax AVS per patient request to:  [facility] residential recovery center Attn: Jamesetta So [phone]  Follow up:  Follow up in 1-2 weeks with Dr. Andrey Campanile    I discussed the assessment and treatment plan with the patient. The patient was provided an opportunity to ask questions and all were answered. The patient agreed with the plan and demonstrated an understanding of the instructions.   The patient was advised to call back or seek an in-person evaluation if the symptoms worsen or if the condition fails to improve as anticipated.  I provided 23 minutes of non-face-to-face time during this encounter.   Ivonne Andrew, NP

## 2021-08-15 NOTE — ED Triage Notes (Signed)
Pt reports his sister told him to come here and get his heart checked because she has heart problems. Pt denies chest pain or other complaints, other than anxiety attacks at night.

## 2021-08-15 NOTE — Discharge Instructions (Addendum)
You were diagnosed today with pneumonia on the right side of your lung. Fortunately, you seem to be breathing well without any signs of fever or illness that would warrant admission. This means you can be discharged home with antibiotics - I have given you your first dose of antibiotics here in the ED, so your next dose can be taken tomorrow after you fill your prescriptions. Our social worker talked with you about going to a shelter tonight if you need a place to sleep, and I have printed off your prescription so you can have it filled at your closest pharmacy in the morning.   Additionally, your prescriptions from your primary care were sent to the [facility], but since transportation may be an issue for you, I have printed out a 30 days supply of your prescriptions to fill at your chosen pharmacy to make this easier for you.   Please follow up with your family doctor in 2-3 days to recheck an xray of your lungs and make sure your pneumonia is improving.   I hope you feel better soon.

## 2021-08-15 NOTE — Care Management (Signed)
ED RNCM received consult for Medication assistance and patient needing assistance with finding a PCP.   Reviewed patient record and noted patient to have insurance so he would not be eligible for medication assistance.  Patient was seen today via phone visit  by Delorse Limber with [facility].  Patient has follow up appoint with cards 08/30/21.

## 2021-08-15 NOTE — ED Provider Notes (Signed)
Emergency Medicine Provider Triage Evaluation Note  Daryl [name] , a 60 y.o. male  was evaluated in triage.  Pt complains of anxiety.  States that patients are told him that she has a heart problem that he should come and get checked out.  Denies any chest pain or shortness of breath at present.  States that he does have some shortness of breath at night when he has anxiety attacks.  Review of Systems  Positive: Anxiety Negative: Chest pain, shortness of breath, leg swelling or tenderness  Physical Exam  BP 138/85    Pulse (!) 110    Temp 97.8 F (36.6 C) (Oral)    Resp 16    SpO2 100%  Gen:   Awake, no distress   Resp:  Normal effort MSK:   Moves extremities without difficulty  Other:    Medical Decision Making  Medically screening exam initiated at 5:43 PM.  Appropriate orders placed.  Daryl [name] was informed that the remainder of the evaluation will be completed by another provider, this initial triage assessment does not replace that evaluation, and the importance of remaining in the ED until their evaluation is complete.  EKG placed at this time due to patient's slight tachycardia   Daryl [name] 08/15/21 1745    Glendora Score, MD 08/15/21 2328

## 2021-08-15 NOTE — ED Notes (Signed)
Pt resting comfortably in hallway. Denies pain at this time

## 2021-08-19 ENCOUNTER — Telehealth (HOSPITAL_COMMUNITY): Payer: Self-pay | Admitting: Emergency Medicine

## 2021-08-19 NOTE — BH Assessment (Signed)
Care Management - Follow Up [facility] Discharges   Writer made contact with the patient and his adult son. Patient reports that he was not able to follow up with [facility] because they requested that the patient bring in paperwork from his cardiologist in order to be considered eligible for placement in the facility.

## 2021-08-21 ENCOUNTER — Other Ambulatory Visit: Payer: Self-pay

## 2021-08-23 ENCOUNTER — Ambulatory Visit (HOSPITAL_COMMUNITY)
Admission: AD | Admit: 2021-08-23 | Discharge: 2021-08-23 | Disposition: A | Discharge status: Home / Self Care | Payer: Medicaid Other | Attending: Family | Admitting: Family

## 2021-08-23 DIAGNOSIS — F1994 Other psychoactive substance use, unspecified with psychoactive substance-induced mood disorder: Secondary | ICD-10-CM | POA: Insufficient documentation

## 2021-08-23 NOTE — Progress Notes (Signed)
Patient discharged from facility into police presence who will take him back home.  Patient wanted money from Clinical research associate to get his sister a soda.Ambulated out of facility no acute distress

## 2021-08-23 NOTE — Discharge Instructions (Addendum)

## 2021-08-23 NOTE — Progress Notes (Signed)
Daryl Heater, FNP, recommends for patient to have substance use outpatient resources.  CSW added resources that accept his insurance in the AVS for outpatient follow up.    Janelle Culton, LCSW, LCAS Clincal Social Worker  [facility]

## 2021-08-23 NOTE — ED Provider Notes (Signed)
Behavioral Health Urgent Care Medical Screening Exam  Patient Name: Daryl [name] MRN: [id number] Date of Evaluation: 08/23/21 Chief Complaint:   Diagnosis:  Final diagnoses:  Substance induced mood disorder (HCC)    History of Present illness: Daryl [name] is a 60 y.o. male. Patient presents voluntarily to [facility] behavioral health for walk-in assessment.  Patient is transported by police, remains voluntary at this time.  Patient states his mother called police earlier today.  It appears there was some frustration at home as patient states he does not want to return to the home with his mother.  He also shares he became frustrated with a friend named Daryl [name] last night.  It is unclear at this time if Daryl [name] was at [employer] home or at a different residence.  Daryl [name] reports he has "did a little bit of drugs tonight."  He refuses to specify type or amount of substances used.  He also endorses alcohol use during the last 24 hours.  Patient reports he would like to return to his home in [place] as he has "friends who are going to party."  Patient has a history of alcohol use disorder.  He is not currently linked with outpatient psychiatry.  He is recently attempted to enroll in residential substance use treatment at [facility].  He denies current medications to address mood.  At this time patient is not interested in substance use treatment.  Patient is assessed face-to-face by nurse practitioner.  He is seated in assessment area, no acute distress.  He is alert and oriented, pleasant and cooperative during assessment.   He presents with euthymic mood, congruent affect. He denies suicidal and homicidal ideations.  He denies history of suicide attempts, denies history of self-harm.  He contracts verbally for safety with this Clinical research associate.  He has normal speech and behavior.  He denies both auditory and visual hallucinations.  Patient is able to converse coherently with goal-directed thoughts and no  distractibility or preoccupation.  He denies paranoia.  Objectively there is no evidence of psychosis/mania or delusional thinking.  Patient currently resides with his mother in [place], denies access to weapons.  He is not currently employed.  Patient endorses average sleep and appetite.  Patient offered support and encouragement.  He gives verbal consent to speak with family members including his son, his niece and his mother.  TTS counselor attempted to reach family members.   Psychiatric Specialty Exam  Presentation  General Appearance:Appropriate for Environment; Casual  Eye Contact:Good  Speech:Clear and Coherent; Normal Rate  Speech Volume:Normal  Handedness:Right   Mood and Affect  Mood:Euthymic  Affect:Appropriate; Congruent   Thought Process  Thought Processes:Coherent; Goal Directed; Linear  Descriptions of Associations:Intact  Orientation:Full (Time, Place and Person)  Thought Content:Logical  Diagnosis of Schizophrenia or Schizoaffective disorder in past: No   Hallucinations:None  Ideas of Reference:None  Suicidal Thoughts:No  Homicidal Thoughts:No   Sensorium  Memory:Immediate Fair; Recent Fair; Remote Poor  Judgment:Fair  Insight:Present   Executive Functions  Concentration:Good  Attention Span:Good  Recall:Good  Fund of Knowledge:Good  Language:Good   Psychomotor Activity  Psychomotor Activity:Normal   Assets  Assets:Communication Skills; Resilience; Social Support; Leisure Time   Sleep  Sleep:Good  Number of hours: No data recorded  No data recorded  Physical Exam: Physical Exam Vitals and nursing note reviewed.  Constitutional:      Appearance: Normal appearance. He is well-developed and normal weight.  HENT:     Head: Normocephalic and atraumatic.     Nose:  Nose normal.  Cardiovascular:     Rate and Rhythm: Normal rate.  Pulmonary:     Effort: Pulmonary effort is normal.  Musculoskeletal:         General: Normal range of motion.     Cervical back: Normal range of motion.  Skin:    General: Skin is warm and dry.  Neurological:     Mental Status: He is alert and oriented to person, place, and time.  Psychiatric:        Attention and Perception: Attention and perception normal.        Mood and Affect: Mood and affect normal.        Speech: Speech normal.        Behavior: Behavior normal. Behavior is cooperative.        Thought Content: Thought content normal.        Cognition and Memory: Cognition and memory normal.   Review of Systems  Constitutional: Negative.   HENT: Negative.    Eyes: Negative.   Respiratory: Negative.    Cardiovascular: Negative.   Gastrointestinal: Negative.   Genitourinary: Negative.   Musculoskeletal: Negative.   Skin: Negative.   Neurological: Negative.   Endo/Heme/Allergies: Negative.   Psychiatric/Behavioral:  Positive for substance abuse.   Blood pressure 124/87, pulse 100, resp. rate 18, SpO2 100 %. There is no height or weight on file to calculate BMI.  Musculoskeletal: Strength & Muscle Tone: within normal limits Gait & Station: normal Patient leans: N/A   [facility] MSE Discharge Disposition for Follow up and Recommendations: Based on my evaluation the patient does not appear to have an emergency medical condition and can be discharged with resources and follow up care in outpatient services for Medication Management, Substance Abuse Intensive Outpatient Program, and Individual Therapy Patient reviewed with Dr. Gasper Sells. Follow-up with outpatient psychiatry, resources provided. Follow-up with substance use treatment resources provided.   [name], FNP 08/23/2021, 8:49 AM

## 2021-08-23 NOTE — BH Assessment (Signed)
Spoke with pt family again about pt visit today. TTS offered suggestions to bring pt to [facility] outpt services next week for open access and also follow up with [facility].Family wants to know why pt could not stay overnight for observation. TTS explained that pt presented voluntary to [facility] and based off NP assessment pt did not meet criteria for inpt treatment. TTS encouraged family to continue to support pt and offer him [facility] services if pt presents in a crisis. NP notified.

## 2021-08-23 NOTE — BH Assessment (Addendum)
Pt to [facility] with [employer] reporting using drugs and alcohol last night. Reports his hands were frozen and requesting food and coffee. Pt reports he thought his grandmother turned into a cat. Pt denies SI, HI, AVH. TTS made attempts to call pt supports (niece and mother).    Pt is routine.   TTS received a call back from patient niece reporting that pt arrived at his grandmother house this morning around 5am ringing the doorbell, knocking on the door and windows. Niece reports that pt left and returned with police. Niece reports that pt flipped out on her grandmother and she will not let him back in her house. Niece reports that her grandmother will not tell her all what happened, but she reported that pt threatened to harm her grandmother.  Niece reports that pt is having a "mental breakdown" and presents a danger to himself because he is walking in the middle of the road and walking all night long. Niece reports when pt is living with other family members he is up late at night pacing the floor and talking to people who are not there. Niece reports that pt thinks he did a movie with Garnet Koyanagi and reports that someone is dead when actually the person he is referencing is still alive. Niece reports that pt has never attacked anyone because "he is not that type of person, he is very sweet, but she fears that the voices will tell him to harm someone. Niece reports that no one in the family is going to come and pick patient up because the family is afraid of him. Niece feels that [facility] is not trying to help pt nor telling the family how pt can be helped. Niece would like for pt to go inpt for a week to see some of the behaviors he is having and to also start him on medications. TTS shared with niece that pt denies SI, HI, AVH and he does not appear to be responding to internal/external stimuli and most of issues are coming from his substance use. TTS shared that based off the information that she is sharing pt does  not seem to be danger to himself or others and NP has psych cleared pt to return home. TTS offered suggestions of petitioning for legal guardianship and assisting pt with following up with outpt resources provided during pt last visit. TTS also encouraged family to assist pt with getting documentation he needs to help him get into treatment at [facility].

## 2021-08-28 ENCOUNTER — Ambulatory Visit: Payer: Medicaid Other | Admitting: Family Medicine

## 2021-08-29 DIAGNOSIS — R55 Syncope and collapse: Secondary | ICD-10-CM | POA: Insufficient documentation

## 2021-08-29 NOTE — Progress Notes (Signed)
Cardiology Office Note   Date:  08/30/2021   ID:  Daryl [name], DOB 1961-05-06, MRN [id number]  PCP:  Patient, No Pcp Per (Inactive)  Cardiologist:   Minus Breeding, MD   Chief Complaint  Patient presents with   Dizziness       History of Present Illness: Daryl [name] is a 61 y.o. male who presents for  follow up post sepsis and acute hypoxic respiratory failure in the setting of rhinovirus infection, superimposed on bacterial infection.   This was in Jan 2020.  EF was 20%.  He had coronary calcification on CT.  He underwent a RHC/LHC revealed non-obstructive CAD with no more than 20% stenosis in any of his coronary arteries. He had moderate to severe MR. There was evidence of coronary spasm relieved with NTG. 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 He was placed on Bidil and lasix. No ACE or ARB due to CKD.  However I reviewed notes from October/Nov when he was in the hospital last time.  He was hospitalized with acute on chronic systolic heart failure and also had alcohol withdrawal delirium.  He was actually discharged on Avapro along with other medicines listed below.  He has had many ER visits and behavioral health visits.  He unfortunately continues to have polysubstance abuse.  I am surprised that he came for his appointment we called his son and his son notified him.  The patient's been out of medications until a couple of days ago when he got his bottles refilled.  The one medicatoin that he does not have his Lasix.  Sounds like he might of taken some of his pills yesterday but not yet today.  He really has a very limited understanding of his cardiac condition although he knows he has a weak heart muscle.  He has had nonsustained VT in the past.  He describes some vague symptoms yesterday of walking and loose stool and feeling somewhat out of body.  He felt like his "body was breaking down."  He had to go to the bathroom urgently.  He did not really describe  tachypalpitations, presyncope or syncope.  He has not had any obvious new shortness of breath or description of PND or orthopnea.  He has had some lower extremity swelling.     Past Medical History:  Diagnosis Date   Asthma    Chronic combined systolic and diastolic CHF (congestive heart failure) ([place])    Dyslipidemia    Essential hypertension 11/02/2018   ETOH abuse 02/14/2020   Nonobstructive CAD    Non-obstructive disease on cardiac cath in 08/2018   NSVT (nonsustained ventricular tachycardia)    PNA (pneumonia) 09/11/2018   Sepsis ([place]) 09/11/2018   Stage 3a chronic kidney disease ([place]) 02/19/2020   Tobacco abuse 09/11/2018    Past Surgical History:  Procedure Laterality Date   IR THORACENTESIS ASP PLEURAL SPACE W/IMG GUIDE  09/13/2018   RIGHT/LEFT HEART CATH AND CORONARY ANGIOGRAPHY N/A 09/15/2018   Procedure: RIGHT/LEFT HEART CATH AND CORONARY ANGIOGRAPHY;  Surgeon: Leonie Man, MD;  Location: [facility];  Service: Cardiovascular;  Laterality: N/A;     Current Outpatient Medications  Medication Sig Dispense Refill   atorvastatin (LIPITOR) 40 MG tablet Take 1 tablet (40 mg total) by mouth daily. 30 tablet 0   folic acid (FOLVITE) 1 MG tablet Take 1 tablet (1 mg total) by mouth daily. 30 tablet 0   furosemide (LASIX) 20 MG tablet  Take 1 tablet (20 mg total) by mouth daily. 90 tablet 3   irbesartan (AVAPRO) 75 MG tablet Take 0.5 tablets (37.5 mg total) by mouth daily. 15 tablet 0   metoprolol succinate (TOPROL-XL) 50 MG 24 hr tablet Take 1 tablet (50 mg total) by mouth daily. Take with or immediately following a meal. 30 tablet 0   potassium chloride 20 MEQ TBCR Take 40 mEq by mouth daily. 60 tablet 0   potassium chloride SA (KLOR-CON M) 20 MEQ tablet Take 2 tablets (40 mEq total) by mouth daily. 60 tablet 0   spironolactone (ALDACTONE) 25 MG tablet Take 0.5 tablets (12.5 mg total) by mouth daily. 15 tablet 0   thiamine 100 MG tablet Take 1 tablet (100 mg total) by  mouth in the morning and at bedtime. 60 tablet 0   No current facility-administered medications for this visit.    Allergies:   Penicillins    ROS:  Please see the history of present illness.   Otherwise, review of systems are positive for mucous in his none .   All other systems are reviewed and negative.    PHYSICAL has been back on him now just to be met: VS:  BP 112/78 (BP Location: Left Arm, Patient Position: Sitting, Cuff Size: Normal)    Pulse 87    Resp 20    Ht _0  (1.727 m)    Wt 176 lb 12.8 oz (80.2 kg)    SpO2 96%    BMI 26.88 kg/m  , BMI Body mass index is 26.88 kg/m. GENERAL:  Well appearing NECK: Positive jugular venous distention to the jaw at 45 degrees, waveform within normal limits, carotid upstroke brisk and symmetric, no bruits, no thyromegaly LUNGS:  Clear to auscultation bilaterally CHEST:  Unremarkable HEART:  PMI is displaced and sustained ,S1 and S2 within normal limits, no S3, no S4, no clicks, no rubs, 3 out of 6 systolic murmur radiating slightly at the aortic outflow tract murmurs ABD:  Flat, positive bowel sounds normal in frequency in pitch, no bruits, no rebound, no guarding, no midline pulsatile mass, no hepatomegaly, no splenomegaly EXT:  2 plus pulses throughout, moderate edema, no cyanosis no clubbing   EKG:  EKG is  ordered today. Sinus rhythm, rate 87, axis within normal limits, , QTC is prolonged, no acute ST-T wave changes, nonspecific lateral T wave changes unchanged from previous.  Poor anterior R wave progression, possible left atrial enlargement.  Probably repolarization changes  Recent Labs: 05/17/2021: TSH 3.640 06/26/2021: Magnesium 2.0 08/08/2021: ALT 45; B Natriuretic Peptide 2,353.1 08/15/2021: BUN 20; Creatinine, Ser 1.15; Hemoglobin 12.8; Platelets 497; Potassium 3.9; Sodium 139    Lipid Panel    Component Value Date/Time   CHOL 127 09/15/2018 0441   TRIG 124 09/15/2018 0441   HDL 33 (L) 09/15/2018 0441   CHOLHDL 3.8  09/15/2018 0441   VLDL 25 09/15/2018 0441   LDLCALC 69 09/15/2018 0441      Wt Readings from Last 3 Encounters:  08/30/21 176 lb 12.8 oz (80.2 kg)  08/08/21 189 lb 9.5 oz (86 kg)  07/20/21 189 lb (85.7 kg)      Other studies Reviewed: Additional studies/ records that were reviewed today include:   Extensive review of hospital records and multiple ED records  ( (Greater than 40 minutes reviewing all data with greater than 50% face to face with the patient). Review of the above records demonstrates: See elsewhere   ASSESSMENT AND PLAN:    NICM:  He has acute on chronic systolic heart failure but is not in distress.  EF of 20%.  This is a very unfortunate situation.  He has a limited understanding.  He has polysubstance abuse.  There does seem to be some social support with his son but it looks like her housing situation from the multiple notes is not good.  He has been nonadherent with medications.   We had a long discussion today.  He understands he is at high risk of dying if he does not take his medications.  He understands that alcohol is likely contributing to his cardiomyopathy and he needs to stop this.  I am going to start by reiterating that he takes the medications that he brought with him today and he asked the appropriate medicines.  I am going to check a comprehensive metabolic profile and a CBC.  I am going to start Lasix 20 mg daily.  We are going to schedule to see him back next week and hopefully he can go and live with this and perhaps we can get him on the right broke.  I am going to have our care manager contact him as he mentioned wanting to apply for disability.    Syncope:   He has had documented nonsustained VT.  He is not having frank syncope recently.  We will try to monitor him with a Zio patch in the past but given his social situation this is not noticeable.  Perhaps if he demonstrates compliance in the future and takes his medications and has no improvement in his  ejection fraction we could consider an ICD.  I talked to him about his risk of sudden death.  Hypertension:   This is being managed in the context of treating his heart failure.   CKD:    CKD IIIa.  I will check labs as above.  ETOH Abuse:    He has not wanted voluntary detox in the past.  I talked to him about the importance of this and compliant with suggestions in the future.  We have talked at length previously about the need to stop drinking completely.  Current medicines are reviewed at length with the patient today.  The patient does not have concerns regarding medicines.  The following changes have been made:    Labs/ tests ordered today include:   Orders Placed This Encounter  Procedures   Comprehensive Metabolic Panel (CMET)   CBC w/Diff/Platelet   EKG 12-Lead     Disposition:   FU with me next week.   Signed, Minus Breeding, MD  08/30/2021 12:22 PM    [facility]

## 2021-08-30 ENCOUNTER — Other Ambulatory Visit: Payer: Self-pay

## 2021-08-30 ENCOUNTER — Ambulatory Visit (INDEPENDENT_AMBULATORY_CARE_PROVIDER_SITE_OTHER): Payer: Medicaid Other | Admitting: Cardiology

## 2021-08-30 ENCOUNTER — Encounter: Payer: Self-pay | Admitting: Cardiology

## 2021-08-30 VITALS — BP 112/78 | HR 87 | Resp 20 | Ht 68.0 in | Wt 176.8 lb

## 2021-08-30 DIAGNOSIS — I428 Other cardiomyopathies: Secondary | ICD-10-CM

## 2021-08-30 DIAGNOSIS — I10 Essential (primary) hypertension: Secondary | ICD-10-CM

## 2021-08-30 DIAGNOSIS — R55 Syncope and collapse: Secondary | ICD-10-CM

## 2021-08-30 MED ORDER — FUROSEMIDE 20 MG PO TABS
20.0000 mg | ORAL_TABLET | Freq: Every day | ORAL | 3 refills | Status: DC
  Filled 2021-08-30: qty 90, 90d supply, fill #0

## 2021-08-30 NOTE — Patient Instructions (Signed)
Medication Instructions:  Start taking Lasix 20 mg daily Continue all other medications *If you need a refill on your cardiac medications before your next appointment, please call your pharmacy*   Lab Work: Cmet,cbc today   Testing/Procedures: None ordered   Follow-Up: At [facility], you and your health needs are our priority.  As part of our continuing mission to provide you with exceptional heart care, we have created designated Provider Care Teams.  These Care Teams include your primary Cardiologist (physician) and Advanced Practice Providers (APPs -  Physician Assistants and Nurse Practitioners) who all work together to provide you with the care you need, when you need it.  We recommend signing up for the patient portal called "MyChart".  Sign up information is provided on this After Visit Summary.  MyChart is used to connect with patients for Virtual Visits (Telemedicine).  Patients are able to view lab/test results, encounter notes, upcoming appointments, etc.  Non-urgent messages can be sent to your provider as well.   To learn more about what you can do with MyChart, go to [email or web address].      Your next appointment:  1 week     The format for your next appointment: Office   Provider:  PA

## 2021-09-03 ENCOUNTER — Other Ambulatory Visit: Payer: Self-pay

## 2021-09-04 ENCOUNTER — Telehealth (HOSPITAL_COMMUNITY): Payer: Self-pay | Admitting: Emergency Medicine

## 2021-09-04 NOTE — BH Assessment (Signed)
Care Management - Follow Up [facility] Discharges    Writer made contact with the patient and his adult son. Patient reports that he continues to work with his medical doctors and he has not tried to go to [facility].  Writer provided information for the [facility] for mental health treatment during Open Access hours if needed.

## 2021-09-05 ENCOUNTER — Ambulatory Visit (INDEPENDENT_AMBULATORY_CARE_PROVIDER_SITE_OTHER): Payer: Medicaid Other | Admitting: Cardiology

## 2021-09-05 ENCOUNTER — Other Ambulatory Visit: Payer: Self-pay

## 2021-09-05 ENCOUNTER — Encounter: Payer: Self-pay | Admitting: Cardiology

## 2021-09-05 VITALS — BP 123/86 | HR 88 | Ht 68.0 in | Wt 174.4 lb

## 2021-09-05 DIAGNOSIS — R600 Localized edema: Secondary | ICD-10-CM | POA: Diagnosis not present

## 2021-09-05 MED ORDER — IRBESARTAN 75 MG PO TABS
75.0000 mg | ORAL_TABLET | Freq: Every day | ORAL | 3 refills | Status: DC
  Filled 2021-09-05: qty 90, 90d supply, fill #0

## 2021-09-05 NOTE — Progress Notes (Signed)
Cardiology Office Note   Date:  09/05/2021   ID:  Daryl [name], DOB 1961-02-12, MRN [id number]  PCP:  Patient, No Pcp Per (Inactive)  Cardiologist:   Minus Breeding, MD   Chief Complaint  Patient presents with   Edema       History of Present Illness: Daryl [name] is a 61 y.o. male who presents for  follow up post sepsis and acute hypoxic respiratory failure in the setting of rhinovirus infection, superimposed on bacterial infection.   This was in Jan 2020.  EF was 20%.  He had coronary calcification on CT.  He underwent a RHC/LHC revealed non-obstructive CAD with no more than 20% stenosis in any of his coronary arteries. He had moderate to severe MR. There was evidence of coronary spasm relieved with NTG. 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 He was placed on Bidil and lasix. No ACE or ARB due to CKD.  However I reviewed notes from October/Nov when he was in the hospital last time.  He was hospitalized with acute on chronic systolic heart failure and also had alcohol withdrawal delirium.  He was actually discharged on Avapro along with other medicines listed below.  He has had many ER visits and behavioral health visits.  He unfortunately continues to have polysubstance abuse.  I was able to start Lasix and have another frank conversation with him about his risk of dying with his nonadherence and his severe cardiomyopathy.  I think he is taking his medications as prescribed.  However, he is having a problem with frequent urination and he is bothered by this.  He does have lower extremity swelling.  He is not describing any new shortness of breath, PND or orthopnea.  He says he has not been drinking alcohol.  He has not had any palpitations, presyncope or syncope.  He has had no chest pain.  Of note his weight is down a few pounds since I saw him.   Past Medical History:  Diagnosis Date   Asthma    Chronic combined systolic and diastolic CHF (congestive heart  failure) ([place])    Dyslipidemia    Essential hypertension 11/02/2018   ETOH abuse 02/14/2020   Nonobstructive CAD    Non-obstructive disease on cardiac cath in 08/2018   NSVT (nonsustained ventricular tachycardia)    PNA (pneumonia) 09/11/2018   Sepsis ([place]) 09/11/2018   Stage 3a chronic kidney disease ([place]) 02/19/2020   Tobacco abuse 09/11/2018    Past Surgical History:  Procedure Laterality Date   IR THORACENTESIS ASP PLEURAL SPACE W/IMG GUIDE  09/13/2018   RIGHT/LEFT HEART CATH AND CORONARY ANGIOGRAPHY N/A 09/15/2018   Procedure: RIGHT/LEFT HEART CATH AND CORONARY ANGIOGRAPHY;  Surgeon: Leonie Man, MD;  Location: [facility];  Service: Cardiovascular;  Laterality: N/A;     Current Outpatient Medications  Medication Sig Dispense Refill   atorvastatin (LIPITOR) 40 MG tablet Take 1 tablet (40 mg total) by mouth daily. 30 tablet 0   folic acid (FOLVITE) 1 MG tablet Take 1 tablet (1 mg total) by mouth daily. 30 tablet 0   furosemide (LASIX) 20 MG tablet Take 1 tablet (20 mg total) by mouth daily. 90 tablet 3   metoprolol succinate (TOPROL-XL) 50 MG 24 hr tablet Take 1 tablet (50 mg total) by mouth daily. Take with or immediately following a meal. 30 tablet 0   potassium chloride SA (KLOR-CON M) 20 MEQ tablet Take 20 mEq by mouth  daily.     spironolactone (ALDACTONE) 25 MG tablet Take 0.5 tablets (12.5 mg total) by mouth daily. 15 tablet 0   thiamine 100 MG tablet Take 1 tablet (100 mg total) by mouth in the morning and at bedtime. 60 tablet 0   irbesartan (AVAPRO) 75 MG tablet Take 1 tablet (75 mg total) by mouth daily. 90 tablet 3   No current facility-administered medications for this visit.    Allergies:   Penicillins    ROS:  Please see the history of present illness.   Otherwise, review of systems are positive for mucous in his none .   All other systems are reviewed and negative.    PHYSICAL has been back on him now just to be met: VS:  BP 123/86    Pulse 88     Ht '5\' 8"'  (1.727 m)    Wt 174 lb 6.4 oz (79.1 kg)    SpO2 98%    BMI 26.52 kg/m  , BMI Body mass index is 26.52 kg/m. GENERAL:  Well appearing NECK:  No jugular venous distention, waveform within normal limits, carotid upstroke brisk and symmetric, no bruits, no thyromegaly LUNGS:  Clear to auscultation bilaterally CHEST:  Unremarkable HEART:  PMI not displaced or sustained,S1 and S2 within normal limits, no S3, no S4, no clicks, no rubs, 3 out of 6 systolic murmur radiating slightly at the aortic outflow tract, no diastolic murmurs ABD:  Flat, positive bowel sounds normal in frequency in pitch, no bruits, no rebound, no guarding, no midline pulsatile mass, no hepatomegaly, no splenomegaly EXT:  2 plus pulses throughout, moderate leg edema, no cyanosis no clubbing   EKG:  EKG is not ordered today.   Recent Labs: 05/17/2021: TSH 3.640 06/26/2021: Magnesium 2.0 08/08/2021: ALT 45; B Natriuretic Peptide 2,353.1 08/15/2021: BUN 20; Creatinine, Ser 1.15; Hemoglobin 12.8; Platelets 497; Potassium 3.9; Sodium 139    Lipid Panel    Component Value Date/Time   CHOL 127 09/15/2018 0441   TRIG 124 09/15/2018 0441   HDL 33 (L) 09/15/2018 0441   CHOLHDL 3.8 09/15/2018 0441   VLDL 25 09/15/2018 0441   LDLCALC 69 09/15/2018 0441      Wt Readings from Last 3 Encounters:  09/05/21 174 lb 6.4 oz (79.1 kg)  08/30/21 176 lb 12.8 oz (80.2 kg)  08/08/21 189 lb 9.5 oz (86 kg)      Other studies Reviewed: Additional studies/ records that were reviewed today include:   None Review of the above records demonstrates: See elsewhere   ASSESSMENT AND PLAN:    NICM:    Unfortunately we were not able to get blood work today because he is a difficult stick.  I am going to send him to the hospital to get this done in about 7 days.  I am going to take this opportunity to increase his Avapro to a full pill.  I think he has been compliant with medications and his blood pressure should tolerate.   We talked  about keeping his feet elevated.  He does not want to wear compression stockings.  Hopefully if he stays compliant with these visits there is a better chance of him surviving with his severe nonischemic cardiomyopathy.    Syncope:   He is not having any syncope.  He has a history of nonsustained ventricular tachycardia.  No change in therapy at this point pending response to med titration.   Hypertension:     This is being managed in the context of  treating his CHF    CKD:     CKD IIIa.  I will check this as above.  ETOH Abuse:   He reports that he is abstaining.  Current medicines are reviewed at length with the patient today.  The patient does not have concerns regarding medicines.  The following changes have been made:   As above  Labs/ tests ordered today include:   Orders Placed This Encounter  Procedures   Compression stockings     Disposition:   FU with me or APP on one month.   Signed, Minus Breeding, MD  09/05/2021 5:49 PM    [facility]

## 2021-09-05 NOTE — Patient Instructions (Signed)
Medication Instructions:  INCREASE irbesartan to 75mg  daily (whole tablet) DECREASE potassium to 1 tablet daily  *If you need a refill on your cardiac medications before your next appointment, please call your pharmacy*   Lab Work: CMET, CBC in 7 days at [facility] -- go to [facility] and tell them you need lab work  If you have labs (blood work) drawn today and your tests are completely normal, you will receive your results only by: [employer] (if you have MyChart) OR A paper copy in the mail If you have any lab test that is abnormal or we need to change your treatment, we will call you to review the results.   Testing/Procedures: NONE   Follow-Up: At [facility], you and your health needs are our priority.  As part of our continuing mission to provide you with exceptional heart care, we have created designated Provider Care Teams.  These Care Teams include your primary Cardiologist (physician) and Advanced Practice Providers (APPs -  Physician Assistants and Nurse Practitioners) who all work together to provide you with the care you need, when you need it.  We recommend signing up for the patient portal called "MyChart".  Sign up information is provided on this After Visit Summary.  MyChart is used to connect with patients for Virtual Visits (Telemedicine).  Patients are able to view lab/test results, encounter notes, upcoming appointments, etc.  Non-urgent messages can be sent to your provider as well.   To learn more about what you can do with MyChart, go to [facility].    Your next appointment:   4 weeks with [facility] or Dr. [facility]   Dr. Antoine Poche recommends KNEE HIGH COMPRESSION STOCKINGS  -- 20-16mmHg mmHg (compression strength) -- [facility]  -- [address] [place]  -- [place]  -- [employer]   -- [address] [place]   -- [phone] -- [facility]  -- [address] [place]  -- [phone]   How to Use Compression Stockings Compression stockings are elastic socks that squeeze the legs. They help to increase blood flow to the legs, decrease swelling in the legs, and reduce the chance of developing blood clots in the lower legs. Compression stockings are often used by people who: Are recovering from surgery. Have poor circulation in their legs. Are prone to getting blood clots in their legs. Have varicose veins. Sit or stay in bed for long periods of time. How to use compression stockings Before you put on your compression stockings: Make sure that they are the correct size. If you do not know your size, ask your health care provider. Make sure that they are clean, dry, and in good condition. Check them for rips and tears. Do not put them on if they are ripped or torn. Put your stockings on first thing in the morning, before you get out of bed. Keep them on for as long as your health care provider advises. When you are wearing your stockings: Keep them as smooth as possible. Do not allow them to bunch up. It is especially important to prevent the stockings from bunching up around your toes or behind your knees. Do not roll the stockings downward and leave them rolled down. This can decrease blood flow to your leg. Change them right away if they become wet or dirty. When you take off your stockings, inspect your legs and feet. Anything that does not seem normal may require medical  attention. Look for: Open sores. Red spots. Swelling. Information and tips Do not stop wearing your compression stockings without talking to your health care provider first. Wash your stockings every day with mild detergent in cold or warm water. Do not use bleach. Air-dry your stockings or dry them in a clothes dryer on low heat. Replace your stockings every 3-6 months. If skin moisturizing is part of your treatment plan, apply lotion or cream at  night so that your skin will be dry when you put on the stockings in the morning. It is harder to put the stockings on when you have lotion on your legs or feet. Contact a health care provider if: Remove your stockings and seek medical care if: You have a feeling of pins and needles in your feet or legs. You have any new changes in your skin. You have skin lesions that are getting worse. You have swelling or pain that is getting worse. Get help right away if: You have numbness or tingling in your lower legs that does not get better right after you take the stockings off. Your toes or feet become cold and blue. You develop open sores or red spots on your legs that do not go away. You see or feel a warm spot on your leg. You have new swelling or soreness in your leg. You are short of breath or you have chest pain for no reason. You have a rapid or irregular heartbeat. You feel light-headed or dizzy. This information is not intended to replace advice given to you by your health care provider. Make sure you discuss any questions you have with your health care provider. Document Released: 06/08/2009 Document Revised: 01/09/2016 Document Reviewed: 07/19/2014 Elsevier Interactive Patient Education  2017 [employer].

## 2021-09-19 NOTE — Progress Notes (Deleted)
Cardiology Office Note   Date:  09/19/2021   ID:  Daryl [name], DOB May 16, 1961, MRN [id number]  PCP:  Patient, No Pcp Per (Inactive)  Cardiologist:   Minus Breeding, MD   No chief complaint on file.      History of Present Illness: Daryl [name] is a 61 y.o. male who presents for  follow up post sepsis and acute hypoxic respiratory failure in the setting of rhinovirus infection, superimposed on bacterial infection.   This was in Jan 2020.  EF was 20%.  He had coronary calcification on CT.  He underwent a RHC/LHC revealed non-obstructive CAD with no more than 20% stenosis in any of his coronary arteries. He had moderate to severe MR. There was evidence of coronary spasm relieved with NTG. 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 He was placed on Bidil and lasix. No ACE or ARB due to CKD.  However I reviewed notes from October/Nov when he was in the hospital last time.  He was hospitalized with acute on chronic systolic heart failure and also had alcohol withdrawal delirium.  He was actually discharged on Avapro along with other medicines listed below.  He has had many ER visits and behavioral health visits.   He presents for follow up.  At the last visit I was able to increase his Avapro.  However, we were not able to draw blood.  ***   *** I was able to start Lasix and have another frank conversation with him about his risk of dying with his nonadherence and his severe cardiomyopathy.  I think he is taking his medications as prescribed.  However, he is having a problem with frequent urination and he is bothered by this.  He does have lower extremity swelling.  He is not describing any new shortness of breath, PND or orthopnea.  He says he has not been drinking alcohol.  He has not had any palpitations, presyncope or syncope.  He has had no chest pain.  Of note his weight is down a few pounds since I saw him.   Past Medical History:  Diagnosis Date   Asthma     Chronic combined systolic and diastolic CHF (congestive heart failure) ([place])    Dyslipidemia    Essential hypertension 11/02/2018   ETOH abuse 02/14/2020   Nonobstructive CAD    Non-obstructive disease on cardiac cath in 08/2018   NSVT (nonsustained ventricular tachycardia)    PNA (pneumonia) 09/11/2018   Sepsis ([place]) 09/11/2018   Stage 3a chronic kidney disease ([place]) 02/19/2020   Tobacco abuse 09/11/2018    Past Surgical History:  Procedure Laterality Date   IR THORACENTESIS ASP PLEURAL SPACE W/IMG GUIDE  09/13/2018   RIGHT/LEFT HEART CATH AND CORONARY ANGIOGRAPHY N/A 09/15/2018   Procedure: RIGHT/LEFT HEART CATH AND CORONARY ANGIOGRAPHY;  Surgeon: Leonie Man, MD;  Location: [facility];  Service: Cardiovascular;  Laterality: N/A;     Current Outpatient Medications  Medication Sig Dispense Refill   atorvastatin (LIPITOR) 40 MG tablet Take 1 tablet (40 mg total) by mouth daily. 30 tablet 0   folic acid (FOLVITE) 1 MG tablet Take 1 tablet (1 mg total) by mouth daily. 30 tablet 0   furosemide (LASIX) 20 MG tablet Take 1 tablet (20 mg total) by mouth daily. 90 tablet 3   irbesartan (AVAPRO) 75 MG tablet Take 1 tablet (75 mg total) by mouth daily. 90 tablet 3   metoprolol succinate (TOPROL-XL) 50  MG 24 hr tablet Take 1 tablet (50 mg total) by mouth daily. Take with or immediately following a meal. 30 tablet 0   potassium chloride SA (KLOR-CON M) 20 MEQ tablet Take 20 mEq by mouth daily.     spironolactone (ALDACTONE) 25 MG tablet Take 0.5 tablets (12.5 mg total) by mouth daily. 15 tablet 0   No current facility-administered medications for this visit.    Allergies:   Penicillins    ROS:  Please see the history of present illness.   Otherwise, review of systems are positive for mucous in his *** .   All other systems are reviewed and negative.    PHYSICAL has been back on him now just to be met: VS:  There were no vitals taken for this visit. , BMI There is no height or  weight on file to calculate BMI. GENERAL:  Well appearing NECK:  No jugular venous distention, waveform within normal limits, carotid upstroke brisk and symmetric, no bruits, no thyromegaly LUNGS:  Clear to auscultation bilaterally CHEST:  Unremarkable HEART:  PMI not displaced or sustained,S1 and S2 within normal limits, no S3, no S4, no clicks, no rubs, *** murmurs ABD:  Flat, positive bowel sounds normal in frequency in pitch, no bruits, no rebound, no guarding, no midline pulsatile mass, no hepatomegaly, no splenomegaly EXT:  2 plus pulses throughout, no edema, no cyanosis no clubbing    ***GENERAL:  Well appearing NECK:  No jugular venous distention, waveform within normal limits, carotid upstroke brisk and symmetric, no bruits, no thyromegaly LUNGS:  Clear to auscultation bilaterally CHEST:  Unremarkable HEART:  PMI not displaced or sustained,S1 and S2 within normal limits, no S3, no S4, no clicks, no rubs, 3 out of 6 systolic murmur radiating slightly at the aortic outflow tract, no diastolic murmurs ABD:  Flat, positive bowel sounds normal in frequency in pitch, no bruits, no rebound, no guarding, no midline pulsatile mass, no hepatomegaly, no splenomegaly EXT:  2 plus pulses throughout, moderate leg edema, no cyanosis no clubbing   EKG:  EKG is *** ordered today. ***   Recent Labs: 05/17/2021: TSH 3.640 06/26/2021: Magnesium 2.0 08/08/2021: ALT 45; B Natriuretic Peptide 2,353.1 08/15/2021: BUN 20; Creatinine, Ser 1.15; Hemoglobin 12.8; Platelets 497; Potassium 3.9; Sodium 139    Lipid Panel    Component Value Date/Time   CHOL 127 09/15/2018 0441   TRIG 124 09/15/2018 0441   HDL 33 (L) 09/15/2018 0441   CHOLHDL 3.8 09/15/2018 0441   VLDL 25 09/15/2018 0441   LDLCALC 69 09/15/2018 0441      Wt Readings from Last 3 Encounters:  09/05/21 174 lb 6.4 oz (79.1 kg)  08/30/21 176 lb 12.8 oz (80.2 kg)  08/08/21 189 lb 9.5 oz (86 kg)      Other studies  Reviewed: Additional studies/ records that were reviewed today include:   *** Review of the above records demonstrates:  ***   ASSESSMENT AND PLAN:    NICM:    He was to get blood work at the hospital.  ***  Unfortunately we were not able to get blood work today because he is a difficult stick.  I am going to send him to the hospital to get this done in about 7 days.  I am going to take this opportunity to increase his Avapro to a full pill.  I think he has been compliant with medications and his blood pressure should tolerate.   We talked about keeping his feet elevated.  He does not want to wear compression stockings.  Hopefully if he stays compliant with these visits there is a better chance of him surviving with his severe nonischemic cardiomyopathy.    Syncope:   *** He is not having any syncope.  He has a history of nonsustained ventricular tachycardia.  No change in therapy at this point pending response to med titration.   Hypertension:    ***  This is being managed in the context of treating his CHF    CKD:     CKD IIIa.  ***  I will check this as above.  ETOH Abuse:   ***  He reports that he is abstaining.  Current medicines are reviewed at length with the patient today.  The patient does not have concerns regarding medicines.  The following changes have been made:   ***   Labs/ tests ordered today include:   ***  No orders of the defined types were placed in this encounter.    Disposition:   FU with me or *** in one month.   Signed, Minus Breeding, MD  09/19/2021 6:50 PM    [facility]

## 2021-09-20 ENCOUNTER — Ambulatory Visit: Payer: Medicaid Other | Admitting: Cardiology

## 2021-09-20 DIAGNOSIS — I428 Other cardiomyopathies: Secondary | ICD-10-CM

## 2021-09-20 DIAGNOSIS — N183 Chronic kidney disease, stage 3 unspecified: Secondary | ICD-10-CM

## 2021-09-20 DIAGNOSIS — I10 Essential (primary) hypertension: Secondary | ICD-10-CM

## 2021-09-30 ENCOUNTER — Inpatient Hospital Stay (HOSPITAL_COMMUNITY)
Admission: EM | Admit: 2021-09-30 | Discharge: 2021-10-07 | DRG: 291 | Disposition: A | Discharge status: Home / Self Care | Payer: Medicaid Other | Attending: Internal Medicine | Admitting: Internal Medicine

## 2021-09-30 DIAGNOSIS — Z20822 Contact with and (suspected) exposure to covid-19: Secondary | ICD-10-CM | POA: Diagnosis present

## 2021-09-30 DIAGNOSIS — T501X6A Underdosing of loop [high-ceiling] diuretics, initial encounter: Secondary | ICD-10-CM | POA: Diagnosis present

## 2021-09-30 DIAGNOSIS — E785 Hyperlipidemia, unspecified: Secondary | ICD-10-CM | POA: Diagnosis present

## 2021-09-30 DIAGNOSIS — I10 Essential (primary) hypertension: Secondary | ICD-10-CM | POA: Diagnosis present

## 2021-09-30 DIAGNOSIS — I081 Rheumatic disorders of both mitral and tricuspid valves: Secondary | ICD-10-CM | POA: Diagnosis present

## 2021-09-30 DIAGNOSIS — K7460 Unspecified cirrhosis of liver: Secondary | ICD-10-CM | POA: Diagnosis present

## 2021-09-30 DIAGNOSIS — R188 Other ascites: Secondary | ICD-10-CM | POA: Diagnosis present

## 2021-09-30 DIAGNOSIS — I272 Pulmonary hypertension, unspecified: Secondary | ICD-10-CM | POA: Diagnosis present

## 2021-09-30 DIAGNOSIS — I509 Heart failure, unspecified: Secondary | ICD-10-CM

## 2021-09-30 DIAGNOSIS — I493 Ventricular premature depolarization: Secondary | ICD-10-CM | POA: Diagnosis present

## 2021-09-30 DIAGNOSIS — Z79899 Other long term (current) drug therapy: Secondary | ICD-10-CM

## 2021-09-30 DIAGNOSIS — I130 Hypertensive heart and chronic kidney disease with heart failure and stage 1 through stage 4 chronic kidney disease, or unspecified chronic kidney disease: Principal | ICD-10-CM | POA: Diagnosis present

## 2021-09-30 DIAGNOSIS — N1831 Chronic kidney disease, stage 3a: Secondary | ICD-10-CM | POA: Diagnosis present

## 2021-09-30 DIAGNOSIS — F1010 Alcohol abuse, uncomplicated: Secondary | ICD-10-CM | POA: Diagnosis present

## 2021-09-30 DIAGNOSIS — I472 Ventricular tachycardia, unspecified: Secondary | ICD-10-CM | POA: Diagnosis present

## 2021-09-30 DIAGNOSIS — I2510 Atherosclerotic heart disease of native coronary artery without angina pectoris: Secondary | ICD-10-CM | POA: Diagnosis present

## 2021-09-30 DIAGNOSIS — Z91199 Patient's noncompliance with other medical treatment and regimen due to unspecified reason: Secondary | ICD-10-CM

## 2021-09-30 DIAGNOSIS — R7989 Other specified abnormal findings of blood chemistry: Secondary | ICD-10-CM

## 2021-09-30 DIAGNOSIS — F1410 Cocaine abuse, uncomplicated: Secondary | ICD-10-CM

## 2021-09-30 DIAGNOSIS — R509 Fever, unspecified: Secondary | ICD-10-CM | POA: Diagnosis not present

## 2021-09-30 DIAGNOSIS — I5043 Acute on chronic combined systolic (congestive) and diastolic (congestive) heart failure: Secondary | ICD-10-CM | POA: Diagnosis present

## 2021-09-30 DIAGNOSIS — I5023 Acute on chronic systolic (congestive) heart failure: Secondary | ICD-10-CM | POA: Diagnosis present

## 2021-09-30 DIAGNOSIS — I959 Hypotension, unspecified: Secondary | ICD-10-CM | POA: Diagnosis not present

## 2021-09-30 DIAGNOSIS — F17210 Nicotine dependence, cigarettes, uncomplicated: Secondary | ICD-10-CM | POA: Diagnosis present

## 2021-09-30 DIAGNOSIS — I428 Other cardiomyopathies: Secondary | ICD-10-CM | POA: Diagnosis present

## 2021-10-01 ENCOUNTER — Other Ambulatory Visit: Payer: Self-pay

## 2021-10-01 ENCOUNTER — Encounter (HOSPITAL_COMMUNITY): Payer: Self-pay

## 2021-10-01 ENCOUNTER — Inpatient Hospital Stay (HOSPITAL_COMMUNITY): Payer: Medicaid Other

## 2021-10-01 ENCOUNTER — Emergency Department (HOSPITAL_COMMUNITY): Payer: Medicaid Other

## 2021-10-01 DIAGNOSIS — I130 Hypertensive heart and chronic kidney disease with heart failure and stage 1 through stage 4 chronic kidney disease, or unspecified chronic kidney disease: Secondary | ICD-10-CM | POA: Diagnosis present

## 2021-10-01 DIAGNOSIS — Z20822 Contact with and (suspected) exposure to covid-19: Secondary | ICD-10-CM | POA: Diagnosis present

## 2021-10-01 DIAGNOSIS — I493 Ventricular premature depolarization: Secondary | ICD-10-CM | POA: Diagnosis present

## 2021-10-01 DIAGNOSIS — F1410 Cocaine abuse, uncomplicated: Secondary | ICD-10-CM

## 2021-10-01 DIAGNOSIS — I10 Essential (primary) hypertension: Secondary | ICD-10-CM

## 2021-10-01 DIAGNOSIS — I5023 Acute on chronic systolic (congestive) heart failure: Secondary | ICD-10-CM | POA: Diagnosis present

## 2021-10-01 DIAGNOSIS — I272 Pulmonary hypertension, unspecified: Secondary | ICD-10-CM | POA: Diagnosis present

## 2021-10-01 DIAGNOSIS — R188 Other ascites: Secondary | ICD-10-CM | POA: Diagnosis present

## 2021-10-01 DIAGNOSIS — R509 Fever, unspecified: Secondary | ICD-10-CM | POA: Diagnosis not present

## 2021-10-01 DIAGNOSIS — K7460 Unspecified cirrhosis of liver: Secondary | ICD-10-CM | POA: Diagnosis present

## 2021-10-01 DIAGNOSIS — E785 Hyperlipidemia, unspecified: Secondary | ICD-10-CM | POA: Diagnosis present

## 2021-10-01 DIAGNOSIS — I081 Rheumatic disorders of both mitral and tricuspid valves: Secondary | ICD-10-CM | POA: Diagnosis present

## 2021-10-01 DIAGNOSIS — T501X6A Underdosing of loop [high-ceiling] diuretics, initial encounter: Secondary | ICD-10-CM | POA: Diagnosis present

## 2021-10-01 DIAGNOSIS — I959 Hypotension, unspecified: Secondary | ICD-10-CM | POA: Diagnosis not present

## 2021-10-01 DIAGNOSIS — F17210 Nicotine dependence, cigarettes, uncomplicated: Secondary | ICD-10-CM | POA: Diagnosis present

## 2021-10-01 DIAGNOSIS — F1010 Alcohol abuse, uncomplicated: Secondary | ICD-10-CM | POA: Diagnosis present

## 2021-10-01 DIAGNOSIS — R7989 Other specified abnormal findings of blood chemistry: Secondary | ICD-10-CM

## 2021-10-01 DIAGNOSIS — R000 Tachycardia, unspecified: Secondary | ICD-10-CM | POA: Diagnosis not present

## 2021-10-01 DIAGNOSIS — I2510 Atherosclerotic heart disease of native coronary artery without angina pectoris: Secondary | ICD-10-CM | POA: Diagnosis present

## 2021-10-01 DIAGNOSIS — N1831 Chronic kidney disease, stage 3a: Secondary | ICD-10-CM | POA: Diagnosis present

## 2021-10-01 DIAGNOSIS — Z91199 Patient's noncompliance with other medical treatment and regimen due to unspecified reason: Secondary | ICD-10-CM

## 2021-10-01 DIAGNOSIS — Z79899 Other long term (current) drug therapy: Secondary | ICD-10-CM | POA: Diagnosis not present

## 2021-10-01 DIAGNOSIS — I472 Ventricular tachycardia, unspecified: Secondary | ICD-10-CM | POA: Diagnosis present

## 2021-10-01 DIAGNOSIS — I428 Other cardiomyopathies: Secondary | ICD-10-CM | POA: Diagnosis present

## 2021-10-01 DIAGNOSIS — I5043 Acute on chronic combined systolic (congestive) and diastolic (congestive) heart failure: Secondary | ICD-10-CM | POA: Diagnosis present

## 2021-10-01 LAB — BILIRUBIN, FRACTIONATED(TOT/DIR/INDIR)
Bilirubin, Direct: 0.6 mg/dL — ABNORMAL HIGH (ref 0.0–0.2)
Indirect Bilirubin: 1.8 mg/dL — ABNORMAL HIGH (ref 0.3–0.9)
Total Bilirubin: 2.4 mg/dL — ABNORMAL HIGH (ref 0.3–1.2)

## 2021-10-01 LAB — RAPID URINE DRUG SCREEN, HOSP PERFORMED
Amphetamines: NOT DETECTED
Barbiturates: NOT DETECTED
Benzodiazepines: NOT DETECTED
Cocaine: POSITIVE — AB
Opiates: NOT DETECTED
Tetrahydrocannabinol: NOT DETECTED

## 2021-10-01 LAB — ECHOCARDIOGRAM COMPLETE
AR max vel: 2.32 cm2
AV Peak grad: 3.1 mmHg
Ao pk vel: 0.88 m/s
Area-P 1/2: 7.51 cm2
MV M vel: 4.93 m/s
MV Peak grad: 97.2 mmHg
S' Lateral: 5.6 cm
Single Plane A4C EF: 29.6 %

## 2021-10-01 LAB — CBC
HCT: 56.9 % — ABNORMAL HIGH (ref 39.0–52.0)
HCT: 50.5 % (ref 39.0–52.0)
Hemoglobin: 18.5 g/dL — ABNORMAL HIGH (ref 13.0–17.0)
Hemoglobin: 16.7 g/dL (ref 13.0–17.0)
MCH: 28.6 pg (ref 26.0–34.0)
MCH: 28.7 pg (ref 26.0–34.0)
MCHC: 32.5 g/dL (ref 30.0–36.0)
MCHC: 33.1 g/dL (ref 30.0–36.0)
MCV: 87.9 fL (ref 80.0–100.0)
MCV: 86.9 fL (ref 80.0–100.0)
Platelets: 296 10*3/uL (ref 150–400)
Platelets: 289 10*3/uL (ref 150–400)
RBC: 6.47 MIL/uL — ABNORMAL HIGH (ref 4.22–5.81)
RBC: 5.81 MIL/uL (ref 4.22–5.81)
RDW: 16.6 % — ABNORMAL HIGH (ref 11.5–15.5)
RDW: 15.4 % (ref 11.5–15.5)
WBC: 9.6 10*3/uL (ref 4.0–10.5)
WBC: 8.6 10*3/uL (ref 4.0–10.5)
nRBC: 0 % (ref 0.0–0.2)
nRBC: 0 % (ref 0.0–0.2)

## 2021-10-01 LAB — COMPREHENSIVE METABOLIC PANEL
ALT: 44 U/L (ref 0–44)
AST: 60 U/L — ABNORMAL HIGH (ref 15–41)
Albumin: 3.7 g/dL (ref 3.5–5.0)
Alkaline Phosphatase: 128 U/L — ABNORMAL HIGH (ref 38–126)
Anion gap: 10 (ref 5–15)
BUN: 17 mg/dL (ref 6–20)
CO2: 22 mmol/L (ref 22–32)
Calcium: 9.2 mg/dL (ref 8.9–10.3)
Chloride: 108 mmol/L (ref 98–111)
Creatinine, Ser: 1.11 mg/dL (ref 0.61–1.24)
GFR, Estimated: >60 mL/min (ref >60)
Glucose, Bld: 108 mg/dL — ABNORMAL HIGH (ref 70–99)
Potassium: 4.8 mmol/L (ref 3.5–5.1)
Sodium: 140 mmol/L (ref 135–145)
Total Bilirubin: 2.5 mg/dL — ABNORMAL HIGH (ref 0.3–1.2)
Total Protein: 7.1 g/dL (ref 6.5–8.1)

## 2021-10-01 LAB — URINALYSIS, ROUTINE W REFLEX MICROSCOPIC
Bilirubin Urine: NEGATIVE
Glucose, UA: NEGATIVE mg/dL
Hgb urine dipstick: NEGATIVE
Ketones, ur: NEGATIVE mg/dL
Leukocytes,Ua: NEGATIVE
Nitrite: NEGATIVE
Protein, ur: NEGATIVE mg/dL
Specific Gravity, Urine: 1.005 (ref 1.005–1.030)
pH: 6 (ref 5.0–8.0)

## 2021-10-01 LAB — HEPATITIS PANEL, ACUTE
HCV Ab: NONREACTIVE
Hep A IgM: NONREACTIVE
Hep B C IgM: NONREACTIVE
Hepatitis B Surface Ag: NONREACTIVE

## 2021-10-01 LAB — RESP PANEL BY RT-PCR (FLU A&B, COVID) ARPGX2
Influenza A by PCR: NEGATIVE
Influenza B by PCR: NEGATIVE
SARS Coronavirus 2 by RT PCR: NEGATIVE

## 2021-10-01 LAB — BRAIN NATRIURETIC PEPTIDE: B Natriuretic Peptide: 3514.2 pg/mL — ABNORMAL HIGH (ref 0.0–100.0)

## 2021-10-01 LAB — CREATININE, SERUM
Creatinine, Ser: 1.25 mg/dL — ABNORMAL HIGH (ref 0.61–1.24)
GFR, Estimated: >60 mL/min (ref >60)

## 2021-10-01 LAB — LIPASE, BLOOD: Lipase: 32 U/L (ref 11–51)

## 2021-10-01 MED ORDER — ENOXAPARIN SODIUM 40 MG/0.4ML IJ SOSY
40.0000 mg | PREFILLED_SYRINGE | Freq: Every day | INTRAMUSCULAR | Status: DC
  Administered 2021-10-02 – 2021-10-06 (×4): 40 mg via SUBCUTANEOUS
  Filled 2021-10-01 (×5): qty 0.4

## 2021-10-01 MED ORDER — SPIRONOLACTONE 12.5 MG HALF TABLET
12.5000 mg | ORAL_TABLET | Freq: Every day | ORAL | Status: DC
  Administered 2021-10-02 – 2021-10-04 (×3): 12.5 mg via ORAL
  Filled 2021-10-01 (×4): qty 1

## 2021-10-01 MED ORDER — FUROSEMIDE 10 MG/ML IJ SOLN
40.0000 mg | Freq: Once | INTRAMUSCULAR | Status: AC
  Administered 2021-10-01: 40 mg via INTRAVENOUS
  Filled 2021-10-01: qty 4

## 2021-10-01 MED ORDER — ENOXAPARIN SODIUM 40 MG/0.4ML IJ SOSY: 40.0000 mg | PREFILLED_SYRINGE | INTRAMUSCULAR | Status: DC

## 2021-10-01 MED ORDER — FUROSEMIDE 10 MG/ML IJ SOLN
60.0000 mg | Freq: Two times a day (BID) | INTRAMUSCULAR | Status: DC
  Administered 2021-10-01 – 2021-10-03 (×4): 60 mg via INTRAVENOUS
  Filled 2021-10-01 (×6): qty 6

## 2021-10-01 MED ORDER — ATORVASTATIN CALCIUM 40 MG PO TABS
40.0000 mg | ORAL_TABLET | Freq: Every day | ORAL | Status: DC
  Administered 2021-10-01 – 2021-10-06 (×5): 40 mg via ORAL
  Filled 2021-10-01 (×6): qty 1

## 2021-10-01 MED ORDER — IRBESARTAN 150 MG PO TABS
75.0000 mg | ORAL_TABLET | Freq: Every day | ORAL | Status: DC
  Administered 2021-10-01 – 2021-10-03 (×3): 75 mg via ORAL
  Filled 2021-10-01 (×3): qty 1

## 2021-10-01 MED ORDER — NITROGLYCERIN 2 % TD OINT
0.5000 [in_us] | TOPICAL_OINTMENT | Freq: Once | TRANSDERMAL | Status: AC
  Administered 2021-10-01: 0.5 [in_us] via TOPICAL
  Filled 2021-10-01: qty 1

## 2021-10-01 MED ORDER — CARVEDILOL 6.25 MG PO TABS
6.2500 mg | ORAL_TABLET | Freq: Two times a day (BID) | ORAL | Status: DC
  Administered 2021-10-01 – 2021-10-03 (×4): 6.25 mg via ORAL
  Filled 2021-10-01 (×4): qty 1

## 2021-10-01 MED ORDER — IRBESARTAN 75 MG PO TABS: 75.0000 mg | ORAL_TABLET | Freq: Every day | ORAL | Status: DC

## 2021-10-01 NOTE — ED Notes (Signed)
Patient states he can't use the restroom to give urine, will continue to try and get urine

## 2021-10-01 NOTE — ED Notes (Signed)
MD paged. Patient requesting breakfast with a current NPO order

## 2021-10-01 NOTE — ED Triage Notes (Signed)
Pt has multiple complaints. He states that his abdomen is hurting from his pancreatitis; pt states that something is hurting, either his hemorrhoids or prostate; pt is having SHOB but is not taking his prescribed lasix because he is going to the bathroom too much.

## 2021-10-01 NOTE — ED Notes (Signed)
Patient ambulated around ER and lowest oxygen level 93% and heart rate went to 119

## 2021-10-01 NOTE — H&P (Addendum)
History and Physical    Patient: Daryl [name] [id number] DOB: [birth year] DOA: 09/30/2021 DOS: the patient was seen and examined on 10/01/2021 PCP: Patient, No Pcp Per (Inactive)  Patient coming from: Home  Chief Complaint:  Chief Complaint  Patient presents with   Abdominal Pain   Shortness of Breath    HPI: Daryl [name] is a 61 y.o. male with medical history significant of HFrEF, HTN, HLD, cocaine abuse. Presenting with dyspnea for the last 2 days. It is worse with activity. He has not had any chest pain, lightheadedness, or palpitations. He has noticed some leg edema and some distention of his stomach. He has had some vague abdominal pain as well. He reports that he hasn't been taking his lasix or BP meds because he doesn't know which medicines to take. He reports recent cocaine use. When his symptoms didn't improve last night, he decided to come to the ED for help. He denies any other aggravating or alleviating factors.      Review of Systems: As mentioned in the history of present illness. All other systems reviewed and are negative. Past Medical History:  Diagnosis Date   Asthma    Chronic combined systolic and diastolic CHF (congestive heart failure) ([place])    Dyslipidemia    Essential hypertension 11/02/2018   ETOH abuse 02/14/2020   Nonobstructive CAD    Non-obstructive disease on cardiac cath in 08/2018   NSVT (nonsustained ventricular tachycardia)    PNA (pneumonia) 09/11/2018   Sepsis ([place]) 09/11/2018   Stage 3a chronic kidney disease ([place]) 02/19/2020   Tobacco abuse 09/11/2018   Past Surgical History:  Procedure Laterality Date   IR THORACENTESIS ASP PLEURAL SPACE W/IMG GUIDE  09/13/2018   RIGHT/LEFT HEART CATH AND CORONARY ANGIOGRAPHY N/A 09/15/2018   Procedure: RIGHT/LEFT HEART CATH AND CORONARY ANGIOGRAPHY;  Surgeon: Leonie Man, MD;  Location: [facility];  Service: Cardiovascular;  Laterality: N/A;   Social History:  reports that he has been smoking  cigarettes. He has been smoking an average of .5 packs per day. He has never used smokeless tobacco. He reports current alcohol use. He reports that he does not use drugs.  Allergies  Allergen Reactions   Penicillins Swelling    Did it involve swelling of the face/tongue/throat, SOB, or low BP? Yes Did it involve sudden or severe rash/hives, skin peeling, or any reaction on the inside of your mouth or nose? No Did you need to seek medical attention at a hospital or doctor's office? Yes When did it last happen?    young adult   If all above answers are "NO", may proceed with cephalosporin use.     History reviewed. No pertinent family history.  Prior to Admission medications   Medication Sig Start Date End Date Taking? Authorizing Provider  potassium chloride SA (KLOR-CON M) 20 MEQ tablet Take 40 mEq by mouth daily.   Yes [provider]  atorvastatin (LIPITOR) 40 MG tablet Take 1 tablet (40 mg total) by mouth daily. 08/15/21 09/14/21  Fenton Foy, NP  furosemide (LASIX) 20 MG tablet Take 1 tablet (20 mg total) by mouth daily. 08/30/21 12/02/21  Minus Breeding, MD  irbesartan (AVAPRO) 75 MG tablet Take 1 tablet (75 mg total) by mouth daily. Patient not taking: Reported on 10/01/2021 09/05/21 10/05/21  Minus Breeding, MD  metoprolol succinate (TOPROL-XL) 50 MG 24 hr tablet Take 1 tablet (50 mg total) by mouth daily. Take with or immediately following a meal. 08/15/21 09/20/21  Fenton Foy, NP  spironolactone (ALDACTONE) 25 MG tablet Take 0.5 tablets (12.5 mg total) by mouth daily. 08/15/21 09/20/21  Fenton Foy, NP    Physical Exam: Vitals:   10/01/21 0630 10/01/21 0700 10/01/21 0730 10/01/21 0830  BP: (!) 137/113 (!) 134/103 (!) 130/107 (!) 136/103  Pulse: 95 (!) 101 95 92  Resp:   16 16  Temp:      TempSrc:      SpO2: 95% 97% 95% 95%   General: 62 y.o. male resting in bed in NAD Eyes: PERRL, normal sclera ENMT: Nares patent w/o discharge, orophaynx clear,  dentition normal, ears w/o discharge/lesions/ulcers Neck: Supple, trachea midline Cardiovascular: RRR, +S1, S2, 3/6 SEM, no g/r, equal pulses throughout Respiratory: decreased at bases, no w/r/r, increased WOB GI: BS+, NDNT, no masses noted, no organomegaly noted MSK: No c/c; BLE edema Neuro: A&O x 3, no focal deficits Psyc: Appropriate interaction and affect, calm/cooperative   Data Reviewed:  BNP 3514.2 Trp 24 -> 24 T bili 2.5  CXR: large right pleural effusion  Assessment and Plan: No notes have been filed under this hospital service. Service: Hospitalist Acute on chronic systolic HF Dyspnea     - admit to inpt, tele     - lasix 60 IV BID, follow I&O/daily wts     - check echo     - consult HF team     - counseled against further coke use  HTN     - resume home regimen  Cocaine abuse     - counseled against further coke use  Medical non-compliance     - says he's confused on what meds to take; will make regimen clear at discharge; needs to follow up with PCP  Elevated LFTs     - likely from HF; check ab [place] and hep panel  Right pleural effusion     - stable from previous exam, no evidence of loculation     - let's get him proper diuresis; if respiratory status isn't improving, can go after this with thoracentesis.   Advance Care Planning: FULL  Consults: HF team  Family Communication: None at bedside  Severity of Illness: The appropriate patient status for this patient is INPATIENT. Inpatient status is judged to be reasonable and necessary in order to provide the required intensity of service to ensure the patient's safety. The patient's presenting symptoms, physical exam findings, and initial radiographic and laboratory data in the context of their chronic comorbidities is felt to place them at high risk for further clinical deterioration. Furthermore, it is not anticipated that the patient will be medically stable for discharge from the hospital within 2  midnights of admission.   * I certify that at the point of admission it is my clinical judgment that the patient will require inpatient hospital care spanning beyond 2 midnights from the point of admission due to high intensity of service, high risk for further deterioration and high frequency of surveillance required.*  Author: Jonnie Finner, DO 10/01/2021 9:21 AM  For on call review [email or web address].

## 2021-10-01 NOTE — Progress Notes (Signed)
Pt received from the ER. Pt on admission has brought a bag of his home Medications. RN tried to explain to the patient that per policy Pt Home Medications will need to be counted and taken to the Pharmacy. Pt pacing in room states "No those are my Medications and they will stay here. I am going to leave" Charge Nurse made aware and Surveyor, quantity came to talk to Patient.

## 2021-10-01 NOTE — ED Provider Notes (Signed)
[facility] DEPT Provider Note   CSN: [id number] Arrival date & time: 09/30/21  2352     History  Chief Complaint  Patient presents with   Abdominal Pain   Shortness of Breath    Daryl [name] is a 61 y.o. male.  The history is provided by the patient.  Shortness of Breath Severity:  Moderate Onset quality:  Gradual Timing:  Intermittent Progression:  Worsening Chronicity:  Recurrent Relieved by:  Rest Worsened by:  Exertion Associated symptoms: abdominal pain   Associated symptoms: no fever   Patient with history of systolic heart failure, hypertension, alcohol use disorder presents with multiple complaints.  He reports he has abdominal pain and distention and thinks it might be his pancreatitis.  He also told his nurse he may have prostate problems.  He also reports increasing shortness of breath is worse with exertion.  He reports he is not always compliant with his furosemide due to multiple trips to the restroom.  He reports he wears  a garbage bag in case it is a negative the bathroom in time   Past Medical History:  Diagnosis Date   Asthma    Chronic combined systolic and diastolic CHF (congestive heart failure) ([place])    Dyslipidemia    Essential hypertension 11/02/2018   ETOH abuse 02/14/2020   Nonobstructive CAD    Non-obstructive disease on cardiac cath in 08/2018   NSVT (nonsustained ventricular tachycardia)    PNA (pneumonia) 09/11/2018   Sepsis ([place]) 09/11/2018   Stage 3a chronic kidney disease ([place]) 02/19/2020   Tobacco abuse 09/11/2018    Home Medications Prior to Admission medications   Medication Sig Start Date End Date Taking? Authorizing Provider  atorvastatin (LIPITOR) 40 MG tablet Take 1 tablet (40 mg total) by mouth daily. 08/15/21 09/14/21  Fenton Foy, NP  furosemide (LASIX) 20 MG tablet Take 1 tablet (20 mg total) by mouth daily. 08/30/21 12/02/21  Minus Breeding, MD  irbesartan (AVAPRO) 75 MG tablet Take 1  tablet (75 mg total) by mouth daily. 09/05/21 10/05/21  Minus Breeding, MD  metoprolol succinate (TOPROL-XL) 50 MG 24 hr tablet Take 1 tablet (50 mg total) by mouth daily. Take with or immediately following a meal. 08/15/21 09/20/21  Fenton Foy, NP  potassium chloride SA (KLOR-CON M) 20 MEQ tablet Take 20 mEq by mouth daily.    [provider]  spironolactone (ALDACTONE) 25 MG tablet Take 0.5 tablets (12.5 mg total) by mouth daily. 08/15/21 09/20/21  Fenton Foy, NP      Allergies    Penicillins    Review of Systems   Review of Systems  Constitutional:  Negative for fever.  Respiratory:  Positive for shortness of breath.   Cardiovascular:  Positive for leg swelling.  Gastrointestinal:  Positive for abdominal distention and abdominal pain.   Physical Exam Updated Vital Signs BP (!) 137/99 (BP Location: Left Arm)    Pulse (!) 109    Temp 97.8 F (36.6 C) (Oral)    Resp 20    SpO2 94%  Physical Exam CONSTITUTIONAL: Chronically ill-appearing, anxious HEAD: Normocephalic/atraumatic EYES: EOMI/PERRL ENMT: Mucous membranes moist NECK: supple no meningeal signs SPINE/BACK:entire spine nontender CV: S1/S2 noted, no murmurs/rubs/gallops noted LUNGS: Tachypnea, decreased breath sounds right ABDOMEN: soft, nontender, no rebound or guarding, bowel sounds noted throughout abdomen, mild distention GU:no cva tenderness NEURO: Pt is awake/alert/appropriate, moves all extremitiesx4.  No facial droop.   EXTREMITIES: pulses normal/equal, full ROM, symmetric edema to bilateral lower  extremities SKIN: warm, color normal PSYCH: Anxious ED Results / Procedures / Treatments   Labs (all labs ordered are listed, but only abnormal results are displayed) Labs Reviewed  COMPREHENSIVE METABOLIC PANEL - Abnormal; Notable for the following components:      Result Value   Glucose, Bld 108 (*)    AST 60 (*)    Alkaline Phosphatase 128 (*)    Total Bilirubin 2.5 (*)    All other components  within normal limits  CBC - Abnormal; Notable for the following components:   RBC 6.47 (*)    Hemoglobin 18.5 (*)    HCT 56.9 (*)    RDW 16.6 (*)    All other components within normal limits  BRAIN NATRIURETIC PEPTIDE - Abnormal; Notable for the following components:   B Natriuretic Peptide 3,514.2 (*)    All other components within normal limits  RESP PANEL BY RT-PCR (FLU A&B, COVID) ARPGX2  LIPASE, BLOOD  URINALYSIS, ROUTINE W REFLEX MICROSCOPIC  RAPID URINE DRUG SCREEN, HOSP PERFORMED    EKG EKG Interpretation  Date/Time:  Tuesday October 01 2021 00:17:34 EST Ventricular Rate:  96 PR Interval:  145 QRS Duration: 80 QT Interval:  378 QTC Calculation: 478 R Axis:   42 Text Interpretation: Sinus rhythm Probable left atrial enlargement Anterior infarct, old Borderline repolarization abnormality Confirmed by Ripley Fraise [phone]) on 10/01/2021 12:33:55 AM  Radiology DG Chest Portable 1 View  Result Date: 10/01/2021 CLINICAL DATA:  08/15/2021 EXAM: PORTABLE CHEST 1 VIEW COMPARISON:  Chest x-ray 08/15/2021.  Chest CT 09/13/2018 FINDINGS: Large right pleural effusion with right base atelectasis. Left lung clear. Cardiomegaly. No acute bony abnormality. IMPRESSION: Large right pleural effusion with right base atelectasis, similar to prior study. Electronically Signed   By: Rolm Baptise M.D.   On: 10/01/2021 00:11    Procedures Procedures    Medications Ordered in ED Medications  nitroGLYCERIN (NITROGLYN) 2 % ointment 0.5 inch (0.5 inches Topical Given 10/01/21 0301)  furosemide (LASIX) injection 40 mg (40 mg Intravenous Given 10/01/21 0302)    ED Course/ Medical Decision Making/ A&P Clinical Course as of 10/01/21 0358  Tue Oct 01, 2021  0251 Patient was symptomatic with ambulation.  He became tachycardic and his pulse ox dropped to the low 90s.  Patient is also tachypneic.  I suspect acute on chronic CHF exacerbation. [DW]    Clinical Course User Index [DW] Ripley Fraise,  MD                           Medical Decision Making Amount and/or Complexity of Data Reviewed Labs: ordered. Radiology: ordered.  Risk Prescription drug management. Decision regarding hospitalization.   This patient presents to the ED for concern of shortness of breath & edema, this involves an extensive number of treatment options, and is a complaint that carries with it a high risk of complications and morbidity.  The differential diagnosis includes acute CHF exacerbation, worsening liver disease  Comorbidities that complicate the patient evaluation: Patients presentation is complicated by their history of alcohol use disorder  Social Determinants of Health: Patients history of nonadherence increases the complexity of managing their presentation  Additional history obtained:  Records reviewed previous admission documents  Lab Tests: I Ordered, and personally interpreted labs.  The pertinent results include: Labs reveal erythrocytosis as well as CHF, labs also reveal hyperbilirubinemia  Imaging Studies ordered: I ordered imaging studies including X-ray chest I independently visualized and interpreted imaging which showed  right-sided pleural effusion I agree with the radiologist interpretation  Cardiac Monitoring: The patient was maintained on a cardiac monitor.  I personally viewed and interpreted the cardiac monitor which showed an underlying rhythm of:  sinus tachycardia  Medicines ordered and prescription drug management: I ordered medication including nitroglycerin and Lasix for acute CHF Reevaluation of the patient after these medicines showed that the patient    stayed the same  Critical Interventions:       Diuretics and nitrates  Consultations Obtained: I requested consultation with the admitting physician Dr. Alcario Drought, and discussed  findings as well as pertinent plan - they recommend: Admission  Reevaluation: After the interventions noted above, I  reevaluated the patient and found that they have :stayed the same  Complexity of problems addressed: Patients presentation is most consistent with  severe exacerbation of chronic illness      Disposition: After consideration of the diagnostic results and the patients response to treatment,  I feel that the patent would benefit from admission.    4:00 AM Patient with history of CHF with history of nonadherence to medical regimen.  Patient also has known history of alcohol use disorder.  On ambulation patient became tachycardic and tachypneic.  Patient has clinical evidence of CHF.  He was given nitroglycerin and Lasix.  Plan for admission to the hospital.  Discussed with Dr. Alcario Drought for admission        Final Clinical Impression(s) / ED Diagnoses Final diagnoses:  Acute on chronic systolic congestive heart failure [facility])    Rx / DC Orders ED Discharge Orders     None         Ripley Fraise, MD 10/01/21 0401

## 2021-10-01 NOTE — Progress Notes (Signed)
Charge Nurse along with Primary RN with the Patient placed Home Medications in the Pharmacy Bag and Patient has signed the paper. Charge Nurse has taken the Medications to the Pharmacy.

## 2021-10-01 NOTE — ED Notes (Signed)
Patient sitting on bedside eating lunch tray Denies any needs

## 2021-10-01 NOTE — ED Notes (Signed)
Patient refused to be stuck for lab

## 2021-10-01 NOTE — Consult Note (Signed)
Cardiology Consultation:   Patient ID: DOREAN [name] MRN: [id number]; DOB: September 04, 1960  Admit date: 09/30/2021 Date of Consult: 10/01/2021  PCP:  Patient, No Pcp Per (Inactive)   [facility] Providers Cardiologist:  Minus Breeding, MD   Patient Profile:   Daryl [name] is a 61 y.o. male with a hx of chronic systolic heart failure, NICM, HTN, NSVT, ETOH abuse, CKD stage III, and nonobstructive CAD  who is being seen 10/01/2021 for the evaluation of CHF exacerbation at the request of Dr. Marylyn Ishihara.  History of Present Illness:   Mr. Daryl [name] was hospitalized in January 2020 with sepsis and acute hypoxic respiratory failure in the setting of rhinovirus infection superimposed with bacterial infection.  Echocardiogram at that time showed an LVEF of 20%.  Due to coronary calcifications on CT, he underwent left and right heart catheterization that showed nonobstructive CAD, moderate to severe MR, and evidence of coronary spasm relieved by nitroglycerin.  His NICM felt related to alcohol abuse versus viral myocarditis.  GDMT limited by CKD, no ACE I or ARB, he was discharged on BiDil and Lasix.  Unfortunately he was noncompliant with his medications and presented back to see Dr. Percival Spanish 11/02/2020.  GDMT was titrated.  He was last hospitalized for heart failure exacerbation 05/2021.  He was discharged on Avapro, spironolactone, Lasix, and Toprol.  Treatment of his heart failure has been complicated by poor insight by the patient and noncompliance.  He has also had admissions for behavioral medicine.  He has declined voluntary detox programs in the past.  He was seen by Dr. Percival Spanish 1//23 and had been out of his heart failure medications for a few days.  Lasix 20 mg was started.  He did return for a follow-up visit on 09/05/2021 for ongoing discussion of alcohol cessation and the importance of medication compliance.  The patient reported problems with incontinence with the Lasix.  Avapro was increased.  Labs were not  obtained due to being a hard stick.  Dr. Avon Gully suggested presentation to the ER in 1 week for labs.  It does not appear that this was done.  He presented to [facility] ED 09/30/2021 with shortness of breath and lower extremity swelling.  O2 saturations dropped with ambulation.  He was admitted for IV diuresis.  Unfortunately he continues with polysubstance abuse including alcohol and cocaine.  He reported not taking any of his medications because he was unsure which ones were heart medications.  UDS positive for cocaine.  He also reported abdominal pain and distention and he was concerned about pancreatitis.  BNP 3514 Hb 18.5 sCr 1.11 K 4.8 Lipase 32  Cardiology was asked to evaluate for heart failure exacerbation.  He has received 40 mg IV Lasix x1 dose.  He is scheduled for 60 mg IV twice daily starting this evening.  He was restarted on 75 mg irbesartan.  He has been hypertensive-primarily diastolic hypertension.  He was also better sinus tachycardia in the 100s to 110s.  He reports noncompliance with Lasix due to incontinence.  He is not sure what medications he has been taking at home.   Past Medical History:  Diagnosis Date   Asthma    Chronic combined systolic and diastolic CHF (congestive heart failure) ([place])    Dyslipidemia    Essential hypertension 11/02/2018   ETOH abuse 02/14/2020   Nonobstructive CAD    Non-obstructive disease on cardiac cath in 08/2018   NSVT (nonsustained ventricular tachycardia)    PNA (pneumonia) 09/11/2018   Sepsis ([place])  09/11/2018   Stage 3a chronic kidney disease ([place]) 02/19/2020   Tobacco abuse 09/11/2018    Past Surgical History:  Procedure Laterality Date   IR THORACENTESIS ASP PLEURAL SPACE W/IMG GUIDE  09/13/2018   RIGHT/LEFT HEART CATH AND CORONARY ANGIOGRAPHY N/A 09/15/2018   Procedure: RIGHT/LEFT HEART CATH AND CORONARY ANGIOGRAPHY;  Surgeon: Leonie Man, MD;  Location: [place] [facility];  Service: Cardiovascular;  Laterality: N/A;     Home  Medications:  Prior to Admission medications   Medication Sig Start Date End Date Taking? Authorizing Provider  potassium chloride SA (KLOR-CON M) 20 MEQ tablet Take 40 mEq by mouth daily.   Yes [provider]  atorvastatin (LIPITOR) 40 MG tablet Take 1 tablet (40 mg total) by mouth daily. 08/15/21 09/14/21  Fenton Foy, NP  furosemide (LASIX) 20 MG tablet Take 1 tablet (20 mg total) by mouth daily. 08/30/21 12/02/21  Minus Breeding, MD  irbesartan (AVAPRO) 75 MG tablet Take 1 tablet (75 mg total) by mouth daily. Patient not taking: Reported on 10/01/2021 09/05/21 10/05/21  Minus Breeding, MD  metoprolol succinate (TOPROL-XL) 50 MG 24 hr tablet Take 1 tablet (50 mg total) by mouth daily. Take with or immediately following a meal. 08/15/21 09/20/21  Fenton Foy, NP  spironolactone (ALDACTONE) 25 MG tablet Take 0.5 tablets (12.5 mg total) by mouth daily. 08/15/21 09/20/21  Fenton Foy, NP    Inpatient Medications: Scheduled Meds:  enoxaparin (LOVENOX) injection  40 mg Subcutaneous Q24H   furosemide  60 mg Intravenous BID   irbesartan  75 mg Oral Daily   Continuous Infusions:  PRN Meds:   Allergies:    Allergies  Allergen Reactions   Penicillins Swelling    Did it involve swelling of the face/tongue/throat, SOB, or low BP? Yes Did it involve sudden or severe rash/hives, skin peeling, or any reaction on the inside of your mouth or nose? No Did you need to seek medical attention at a hospital or doctor's office? Yes When did it last happen?    young adult   If all above answers are "NO", may proceed with cephalosporin use.     Social History:   Social History   Socioeconomic History   Marital status: Single    Spouse name: Not on file   Number of children: Not on file   Years of education: Not on file   Highest education level: Not on file  Occupational History   Not on file  Tobacco Use   Smoking status: Every Day    Packs/day: 0.50    Types: Cigarettes    Smokeless tobacco: Never  Vaping Use   Vaping Use: Never used  Substance and Sexual Activity   Alcohol use: Yes   Drug use: Never   Sexual activity: Not on file  Other Topics Concern   Not on file  Social History Narrative   Not on file   Social Determinants of Health   Financial Resource Strain: Not on file  Food Insecurity: Not on file  Transportation Needs: Not on file  Physical Activity: Not on file  Stress: Not on file  Social Connections: Not on file  Intimate Partner Violence: Not on file    Family History:   History reviewed. No pertinent family history.   ROS:  Please see the history of present illness.   All other ROS reviewed and negative.     Physical Exam/Data:   Vitals:   10/01/21 0630 10/01/21 0700 10/01/21 0730 10/01/21  0830  BP: (!) 137/113 (!) 134/103 (!) 130/107 (!) 136/103  Pulse: 95 (!) 101 95 92  Resp:   16 16  Temp:      TempSrc:      SpO2: 95% 97% 95% 95%   No intake or output data in the 24 hours ending 10/01/21 1009 Last 3 Weights 09/05/2021 08/30/2021 08/08/2021  Weight (lbs) 174 lb 6.4 oz 176 lb 12.8 oz 189 lb 9.5 oz  Weight (kg) 79.107 kg 80.196 kg 86 kg     There is no height or weight on file to calculate BMI.  General:  Disheveled, laying in bed, comfortable HEENT: normal Neck: JVD to mid-neck Vascular: No carotid bruits; Distal pulses 2+ bilaterally Cardiac:  Tachycardic, regular, 2/6 systolic murmur best heart at the apex Lungs:  Crackles at the bases Abd: soft, nontender, no hepatomegaly  Ext: 1-2+ pitting edema to the knee Musculoskeletal:  No deformities, BUE and BLE strength normal and equal Skin: warm and dry  Neuro:  CNs 2-12 intact, no focal abnormalities noted Psych:  Normal affect   EKG:  The EKG was personally reviewed and demonstrates:  sinus HR 96, anterior Q waves, TWI lateral leads Telemetry:  Telemetry was personally reviewed and demonstrates:  sinus to sinus tachycardia 90-110s, PVCs and NSVT  Relevant CV  Studies:  Echo 05/2021:  1. Left ventricular ejection fraction, by estimation, is 20 to 25%. The  left ventricle has severely decreased function. The left ventricle  demonstrates global hypokinesis. The left ventricular internal cavity size  was moderately dilated. Left  ventricular diastolic parameters are indeterminate.   2. Right ventricular systolic function is mildly reduced. The right  ventricular size is mildly enlarged. There is severely elevated pulmonary  artery systolic pressure.   3. Left atrial size was severely dilated.   4. Right atrial size was severely dilated.   5. The mitral valve is normal in structure. Severe mitral valve  regurgitation. No evidence of mitral stenosis.   6. Tricuspid valve regurgitation is moderate to severe.   7. The aortic valve is tricuspid. Aortic valve regurgitation is not  visualized. Mild aortic valve sclerosis is present, with no evidence of  aortic valve stenosis.   8. The inferior vena cava is dilated in size with <50% respiratory  variability, suggesting right atrial pressure of 15 mmHg.    Right and left heart cath 09/15/18: Hemodynamic findings consistent with mild pulmonary hypertension. LV end diastolic pressure is mildly elevated. Prox RCA to Mid RCA lesion is 15% stenosed. Mid RCA lesion is 20% stenosed. Prox LAD lesion is 20% stenosed. Dist RCA lesion is 20% stenosed with 30% stenosed side branch in Ost RPDA.   SUMMARY Angiographic evidence of coronary spasm relieved with nitroglycerin in the mid and distal RCA otherwise minimal disease in the LAD. NONISCHEMIC CARDIOMYOPATHY Evidence of elevated right-sided filling pressures with an RAP of 14 mmHg and RVEDP of 17 million mercury consistent with LVEDP of 17-20 mmHg.   RECOMMENDATIONS Patient will return to nursing unit after TR band removal. Continue cardiomyopathy treatment regimen. Appears to be essentially euvolemic by cath.   Defer timing of discharge to primary team  and consultative service.     Laboratory Data:  High Sensitivity Troponin:  No results for input(s): TROPONINIHS in the last 720 hours.   Chemistry Recent Labs  Lab 10/01/21 0119  NA 140  K 4.8  CL 108  CO2 22  GLUCOSE 108*  BUN 17  CREATININE 1.11  CALCIUM 9.2  GFRNONAA >  60  ANIONGAP 10    Recent Labs  Lab 10/01/21 0119  PROT 7.1  ALBUMIN 3.7  AST 60*  ALT 44  ALKPHOS 128*  BILITOT 2.5*   Lipids No results for input(s): CHOL, TRIG, HDL, LABVLDL, LDLCALC, CHOLHDL in the last 168 hours.  Hematology Recent Labs  Lab 10/01/21 0119  WBC 9.6  RBC 6.47*  HGB 18.5*  HCT 56.9*  MCV 87.9  MCH 28.6  MCHC 32.5  RDW 16.6*  PLT 296   Thyroid No results for input(s): TSH, FREET4 in the last 168 hours.  BNP Recent Labs  Lab 10/01/21 0119  BNP 3,514.2*    DDimer No results for input(s): DDIMER in the last 168 hours.   Radiology/Studies:  DG Chest Portable 1 View  Result Date: 10/01/2021 CLINICAL DATA:  08/15/2021 EXAM: PORTABLE CHEST 1 VIEW COMPARISON:  Chest x-ray 08/15/2021.  Chest CT 09/13/2018 FINDINGS: Large right pleural effusion with right base atelectasis. Left lung clear. Cardiomegaly. No acute bony abnormality. IMPRESSION: Large right pleural effusion with right base atelectasis, similar to prior study. Electronically Signed   By: Rolm Baptise M.D.   On: 10/01/2021 00:11     Assessment and Plan:   #Acute on chronic systolic heart failure: #NICM Patient with known history of NICM thought to be primarily driven by alcohol abuse. Prior cath with nonobstructive CAD. TTE with stable EF 20-25%, global hypokinesis, G3DDD, mildly reduced RV systolic function, severe PHTN, severe biatrial enlargement, moderate-to-severe MR, severe TR, RAP 6mmHg. Has been noncompliant with medications and continues to abuse cocaine and alcohol leading to frequent readmissions. Presented on this admission with worsening SOB, LE edema in the setting of medication non-adherence.   -Continue diuresis with lasix 60mg  IV BID -Continue irbesartan 20mg  daily -Resume spironolactone 12.5mg  daily -Change metop to coreg 6.25mg  BID due to known cocaine abuse -Medication noncompliance is a major barrier to care leading to frequent readmissions -Also with continued alcohol and cocaine abuse; cessation recommended -Monitor I/Os and daily weights  #HTN: -Continue irbesartan 20mg  daily -Resume spironolactone 12.5mg  daily -Change metop to coreg 6.25mg  BID due to known cocaine abuse  #Sinus tachycardia He has a history of sinus tachycardia on prior admissions.  Likely secondary to acute volume overload and stopping home metop. -Start coreg 6.25mg  BID -Avoid beta selective agents given cocaine abuse  #Nonobstructive CAD He denies chest pain.  -Resume lipitor 40mg  daily  #Hyperlipidemia with LDL goal less than 70 Noncompliant with statin. -Resume lipitor 40mg  daily  #CKD IIIA: -Monitor with diuresis   Risk Assessment/Risk Scores:        New York Heart Association (NYHA) Functional Class NYHA Class III        For questions or updates, please contact [facility] Please consult [email or web address] for contact info under    Signed, Ledora Bottcher, PA  10/01/2021 10:09 AM

## 2021-10-01 NOTE — ED Notes (Signed)
Save blue tube in main lab °

## 2021-10-01 NOTE — Progress Notes (Signed)
Echocardiogram 2D Echocardiogram has been performed.  Daryl [name] 10/01/2021, 11:52 AM

## 2021-10-02 DIAGNOSIS — Z91199 Patient's noncompliance with other medical treatment and regimen due to unspecified reason: Secondary | ICD-10-CM

## 2021-10-02 DIAGNOSIS — I509 Heart failure, unspecified: Secondary | ICD-10-CM

## 2021-10-02 DIAGNOSIS — F1410 Cocaine abuse, uncomplicated: Secondary | ICD-10-CM

## 2021-10-02 LAB — CBC
HCT: 46.6 % (ref 39.0–52.0)
Hemoglobin: 15.1 g/dL (ref 13.0–17.0)
MCH: 28.2 pg (ref 26.0–34.0)
MCHC: 32.4 g/dL (ref 30.0–36.0)
MCV: 86.9 fL (ref 80.0–100.0)
Platelets: 252 10*3/uL (ref 150–400)
RBC: 5.36 MIL/uL (ref 4.22–5.81)
RDW: 14.8 % (ref 11.5–15.5)
WBC: 6.7 10*3/uL (ref 4.0–10.5)
nRBC: 0.3 % — ABNORMAL HIGH (ref 0.0–0.2)

## 2021-10-02 LAB — COMPREHENSIVE METABOLIC PANEL
ALT: 33 U/L (ref 0–44)
AST: 52 U/L — ABNORMAL HIGH (ref 15–41)
Albumin: 2.9 g/dL — ABNORMAL LOW (ref 3.5–5.0)
Alkaline Phosphatase: 107 U/L (ref 38–126)
Anion gap: 9 (ref 5–15)
BUN: 21 mg/dL — ABNORMAL HIGH (ref 6–20)
CO2: 28 mmol/L (ref 22–32)
Calcium: 8.2 mg/dL — ABNORMAL LOW (ref 8.9–10.3)
Chloride: 100 mmol/L (ref 98–111)
Creatinine, Ser: 1.27 mg/dL — ABNORMAL HIGH (ref 0.61–1.24)
GFR, Estimated: >60 mL/min (ref >60)
Glucose, Bld: 85 mg/dL (ref 70–99)
Potassium: 4.4 mmol/L (ref 3.5–5.1)
Sodium: 137 mmol/L (ref 135–145)
Total Bilirubin: 1.7 mg/dL — ABNORMAL HIGH (ref 0.3–1.2)
Total Protein: 5.7 g/dL — ABNORMAL LOW (ref 6.5–8.1)

## 2021-10-02 NOTE — Progress Notes (Signed)
Progress Note   Patient: Daryl [name] [id number] DOB: 1960-11-22 DOA: 09/30/2021     1 DOS: the patient was seen and examined on 10/02/2021   Brief hospital course: This 61 years old male with PMH significant for HFr EF(LVEF 20 to 25%) hypertension, hyperlipidemia, cocaine abuse presented in the ED with worsening shortness of breath for 2 days.  He denies any chest pain, lightheadedness or palpitations.  He has noticed some leg swelling and weight gain.  Patient also reports vague abdominal pain.  He has not been taking his Lasix or blood pressure medication because he does not know which medications to take.  Patient also reports recent cocaine use. Patient is admitted for acute on chronic systolic CHF requiring IV diuresis.  Assessment and Plan: * Acute on chronic systolic CHF (congestive heart failure) ([name])- (present on admission) Patient presented with worsening shortness of breath, pedal edema and weight gain. Patient has been noncompliant with his CHF medications. BNP 3514.2,  CXR: Large Right pleural effusion same as prior chest x-ray. Continue IV diuresis with the Lasix 60 mg IV twice daily. Daily weight, intake output charting. Repeat 2D echo shows LVEF 20 to 25%. Counseled against cocaine use. Continue irbesartan, spironolactone and Coreg. Cardiology consulted and appreciate recommendation.  Pleural effusion due to CHF (congestive heart failure) ([name]) Found to have  stable pleural effusion from prior exam No evidence of loculation. Continue IV diuresis.  He appears comfortable, respiratory status stable. Consider thoracocentesis if develops shortness of breath.  Elevated LFTs Likely from CHF exacerbation. Abdominal ultrasound shows cirrhosis, mild ascites Hepatitis panel negative.  Continue to trend LFTs  History of noncompliance with medical treatment Counseled in detail about medication compliance.  Cocaine abuse (HCC) Urine drug screen+ cocaine. Counseled against  further cocaine use.  Stage 3a chronic kidney disease ([name])- (present on admission) Serum creatinine at baseline (1.1-1.27). Avoid nephrotoxic medications.  Essential hypertension- (present on admission) Continue irbesartan , spironolactone Metoprolol discontinued due to cocaine use. Continue Coreg 6.25 mg twice daily.   Subjective: Patient was seen and examined at bedside.  Overnight events noted. Patient was sitting comfortably without any difficulty breathing, denies any chest pain, palpitations or dizziness.   He reports feeling much better and wants to be discharged.   Physical Exam: Vitals:   10/01/21 2004 10/02/21 0102 10/02/21 0458 10/02/21 0830  BP: 97/79 91/70 102/80 107/84  Pulse: 88  75 84  Resp: 18 18 18    Temp: 98 F (36.7 C) 97.9 F (36.6 C) 97.7 F (36.5 C)   TempSrc:  Oral Oral   SpO2: 99%     Weight:      Height:       Physical Exam Vitals and nursing note reviewed.  Constitutional:      Appearance: He is well-developed and normal weight.  HENT:     Head: Normocephalic and atraumatic.  Cardiovascular:     Rate and Rhythm: Normal rate and regular rhythm.     Heart sounds: Normal heart sounds, S1 normal and S2 normal.  Pulmonary:     Effort: Pulmonary effort is normal.     Breath sounds: Normal breath sounds. No wheezing or rales.  Abdominal:     General: Abdomen is flat. Bowel sounds are normal. There is no distension or abdominal bruit.     Palpations: Abdomen is soft.  Musculoskeletal:        General: Normal range of motion.     Right lower leg: Edema present.     Left  lower leg: Edema present.  Skin:    General: Skin is warm and dry.  Neurological:     General: No focal deficit present.     Mental Status: He is alert and oriented to person, place, and time.  Psychiatric:        Mood and Affect: Mood normal.        Behavior: Behavior normal.     Data Reviewed: I have Reviewed nursing notes, Vitals, and Lab results since pt's last  encounter. Pertinent lab results BNP 3517. I have independently visualized and interpreted imaging chest x-ray which showed right pleural effusion which is stable from prior chest x-ray I have independently visualized and interpreted EKG which showed EKG: normal EKG, normal sinus rhythm, unchanged from previous tracings, normal sinus rhythm, nonspecific ST and T waves changes. I have discussed pt's care plan and test results with patient in detail.   Family Communication: No family at bedside.  Disposition: Status is: Inpatient Remains inpatient appropriate because: Admitted for acute on chronic systolic CHF exacerbation requiring IV diuresis.   Planned Discharge Destination: Home  DVT prophylaxis: Lovenox CODE STATUS: Full code.  Time spent:  50 minutes  Author: Shawna Clamp, MD 10/02/2021 10:36 AM  For on call review [email or web address].

## 2021-10-02 NOTE — Hospital Course (Signed)
This 61 years old male with PMH significant for HFr EF(LVEF 20 to 25%) hypertension, hyperlipidemia, cocaine abuse presented in the ED with worsening shortness of breath for 2 days.  He denies any chest pain, lightheadedness or palpitations.  He has noticed some leg swelling and weight gain.  Patient also reports vague abdominal pain.  He has not been taking his Lasix or blood pressure medication because he does not know which medications to take.  Patient also reports recent cocaine use. Patient is admitted for acute on chronic systolic CHF requiring IV diuresis.

## 2021-10-02 NOTE — Assessment & Plan Note (Signed)
Urine drug screen+ cocaine. Counseled against further cocaine use.

## 2021-10-02 NOTE — TOC Initial Note (Signed)
Transition of Care [facility]) - Initial/Assessment Note    Patient Details  Name: Daryl [name] MRN: [id number] Date of Birth: 1961-03-18  [facility]) CM/SW Contact:    Dessa Phi, RN Phone Number: 10/02/2021, 10:00 AM  Clinical Narrative: Monitor for d/c needs.                  Expected Discharge Plan: Home/Self Care Barriers to Discharge: Continued Medical Work up   Patient Goals and CMS Choice Patient states their goals for this hospitalization and ongoing recovery are:: home CMS Medicare.gov Compare Post Acute Care list provided to:: Patient Choice offered to / list presented to : Patient  Expected Discharge Plan and Services Expected Discharge Plan: Home/Self Care   Discharge Planning Services: CM Consult Post Acute Care Choice: NA Living arrangements for the past 2 months: Single Family Home                                      Prior Living Arrangements/Services Living arrangements for the past 2 months: Single Family Home Lives with:: Self   Do you feel safe going back to the place where you live?: Yes      Need for Family Participation in Patient Care: No (Comment) Care giver support system in place?: Yes (comment)   Criminal Activity/Legal Involvement Pertinent to Current Situation/Hospitalization: No - Comment as needed  Activities of Daily Living Home Assistive Devices/Equipment: None ADL Screening (condition at time of admission) Patient's cognitive ability adequate to safely complete daily activities?: Yes Is the patient deaf or have difficulty hearing?: No Does the patient have difficulty seeing, even when wearing glasses/contacts?: No Does the patient have difficulty concentrating, remembering, or making decisions?: No Patient able to express need for assistance with ADLs?: Yes Does the patient have difficulty dressing or bathing?: No Independently performs ADLs?: Yes (appropriate for developmental age) Does the patient have  difficulty walking or climbing stairs?: No Weakness of Legs: None Weakness of Arms/Hands: None  Permission Sought/Granted Permission sought to share information with : Case Manager Permission granted to share information with : Yes, Verbal Permission Granted  Share Information with NAME: Case Manager           Emotional Assessment Appearance:: Appears stated age            Admission diagnosis:  Acute on chronic systolic heart failure ([place]) [I50.23] Elevated LFTs [R79.89] Acute on chronic systolic congestive heart failure ([place]) [I50.23] Acute on chronic systolic CHF (congestive heart failure) ([place]) [I50.23] Patient Active Problem List   Diagnosis Date Noted   Acute on chronic systolic heart failure ([place]) 10/01/2021   Cocaine abuse ([place]) 10/01/2021   History of noncompliance with medical treatment 10/01/2021   Elevated LFTs 10/01/2021   Syncope and collapse 08/29/2021   Encounter to establish care 08/15/2021   Alcohol abuse with intoxication delirium ([place])    Alcohol withdrawal delirium ([place]) 06/26/2021   Acute on chronic systolic CHF (congestive heart failure) ([place]) 06/23/2021   Stage 3a chronic kidney disease ([place]) 02/19/2020   ETOH abuse 02/14/2020   Essential hypertension 11/02/2018   Nonischemic cardiomyopathy ([place])    Chronic combined systolic and diastolic CHF (congestive heart failure) ([place])    Tobacco abuse 09/11/2018   PCP:  Patient, No Pcp Per (Inactive) Pharmacy:   [place] at [facility] [address], [place] [address] Phone: [phone] Fax:  [phone]     Social Determinants of Health (SDOH) Interventions    Readmission Risk Interventions No flowsheet data found.

## 2021-10-02 NOTE — Assessment & Plan Note (Addendum)
Patient presented with worsening shortness of breath, pedal edema and weight gain. Patient has been noncompliant with his CHF medications. BNP 3514.2,  CXR: Large Right pleural effusion same as prior chest x-ray. Continue IV diuresis with the Lasix 60 mg IV twice daily. Daily weight, intake output charting. Repeat 2D echo shows LVEF 20 to 25%. Counseled against cocaine use. Continue irbesartan, spironolactone and Coreg. Cardiology consulted and appreciate recommendation.

## 2021-10-02 NOTE — Assessment & Plan Note (Signed)
Serum creatinine at baseline (1.1-1.27). Avoid nephrotoxic medications.

## 2021-10-02 NOTE — Assessment & Plan Note (Signed)
Continue irbesartan , spironolactone Metoprolol discontinued due to cocaine use. Continue Coreg 6.25 mg twice daily.

## 2021-10-02 NOTE — Progress Notes (Addendum)
Progress Note  Patient Name: BOYD MAGSINO Date of Encounter: 10/02/2021  [facility] Cardiologist: Minus Breeding, MD   Subjective   Long discussion about Etoh and cocaine cessation. Breathing improved, no chest pain. Productive cough recently  Inpatient Medications    Scheduled Meds:  atorvastatin  40 mg Oral Daily   carvedilol  6.25 mg Oral BID WC   enoxaparin (LOVENOX) injection  40 mg Subcutaneous Daily   furosemide  60 mg Intravenous BID   irbesartan  75 mg Oral Daily   spironolactone  12.5 mg Oral Daily   Continuous Infusions:  PRN Meds:    Vital Signs    Vitals:   10/01/21 2004 10/02/21 0102 10/02/21 0458 10/02/21 0830  BP: 97/79 91/70 102/80 107/84  Pulse: 88  75 84  Resp: 18 18 18    Temp: 98 F (36.7 C) 97.9 F (36.6 C) 97.7 F (36.5 C)   TempSrc:  Oral Oral   SpO2: 99%     Weight:      Height:        Intake/Output Summary (Last 24 hours) at 10/02/2021 1345 Last data filed at 10/02/2021 0845 Gross per 24 hour  Intake 720 ml  Output 300 ml  Net 420 ml   Last 3 Weights 10/01/2021 09/05/2021 08/30/2021  Weight (lbs) 176 lb 12.9 oz 174 lb 6.4 oz 176 lb 12.8 oz  Weight (kg) 80.2 kg 79.107 kg 80.196 kg      Telemetry    NSR with occasional NSVT - Personally Reviewed  ECG    NSR with poor R wave progression in the anterior leads and TWI in the lateral leads - Personally Reviewed  Physical Exam   GEN: No acute distress.   Neck: No JVD Cardiac: RRR, no murmurs, rubs, or gallops.  Respiratory: Clear to auscultation bilaterally. GI: Soft, nontender, non-distended  MS: LE very tight, 2+ edema; No deformity. Neuro:  Nonfocal  Psych: Normal affect   Labs    High Sensitivity Troponin:  No results for input(s): TROPONINIHS in the last 720 hours.   Chemistry Recent Labs  Lab 10/01/21 0119 10/01/21 1325 10/02/21 0439  NA 140  --  137  K 4.8  --  4.4  CL 108  --  100  CO2 22  --  28  GLUCOSE 108*  --  85  BUN 17  --  21*  CREATININE 1.11  1.25* 1.27*  CALCIUM 9.2  --  8.2*  PROT 7.1  --  5.7*  ALBUMIN 3.7  --  2.9*  AST 60*  --  52*  ALT 44  --  33  ALKPHOS 128*  --  107  BILITOT 2.5* 2.4* 1.7*  GFRNONAA >60 >60 >60  ANIONGAP 10  --  9    Lipids No results for input(s): CHOL, TRIG, HDL, LABVLDL, LDLCALC, CHOLHDL in the last 168 hours.  Hematology Recent Labs  Lab 10/01/21 0119 10/01/21 1325 10/02/21 0439  WBC 9.6 8.6 6.7  RBC 6.47* 5.81 5.36  HGB 18.5* 16.7 15.1  HCT 56.9* 50.5 46.6  MCV 87.9 86.9 86.9  MCH 28.6 28.7 28.2  MCHC 32.5 33.1 32.4  RDW 16.6* 15.4 14.8  PLT 296 289 252   Thyroid No results for input(s): TSH, FREET4 in the last 168 hours.  BNP Recent Labs  Lab 10/01/21 0119  BNP 3,514.2*    DDimer No results for input(s): DDIMER in the last 168 hours.   Radiology    DG Chest Portable 1 View  Result Date: 10/01/2021 CLINICAL DATA:  08/15/2021 EXAM: PORTABLE CHEST 1 VIEW COMPARISON:  Chest x-ray 08/15/2021.  Chest CT 09/13/2018 FINDINGS: Large right pleural effusion with right base atelectasis. Left lung clear. Cardiomegaly. No acute bony abnormality. IMPRESSION: Large right pleural effusion with right base atelectasis, similar to prior study. Electronically Signed   By: Rolm Baptise M.D.   On: 10/01/2021 00:11   ECHOCARDIOGRAM COMPLETE  Result Date: 10/01/2021    ECHOCARDIOGRAM REPORT   Patient Name:   CRISTIEN JELLISON Date of Exam: 10/01/2021 Medical Rec #:  [id number]    Height:       68.0 in Accession #:    [id number]   Weight:       174.4 lb Date of Birth:  [birth year]    BSA:          1.928 m Patient Age:    61 years     BP:           136/103 mmHg Patient Gender: M            HR:           95 bpm. Exam Location:  Inpatient Procedure: 2D Echo Indications:    CHF  History:        Patient has prior history of Echocardiogram examinations, most                 recent 06/23/2021. CHF; Risk Factors:Hypertension.  Sonographer:    Jefferey Pica Referring Phys: [id number] [place]  1. Left  ventricular ejection fraction, by estimation, is 20 to 25%. The left ventricle has severely decreased function. The left ventricle demonstrates global hypokinesis. The left ventricular internal cavity size was mildly dilated. Left ventricular diastolic parameters are consistent with Grade III diastolic dysfunction (restrictive). Elevated left atrial pressure.  2. Right ventricular systolic function is mildly reduced. The right ventricular size is moderately enlarged. There is severely elevated pulmonary artery systolic pressure.  3. Left atrial size was severely dilated.  4. Right atrial size was severely dilated.  5. The mitral valve is normal in structure. Moderate to severe mitral valve regurgitation.  6. Tricuspid valve regurgitation is severe.  7. The aortic valve is normal in structure. Aortic valve regurgitation is not visualized. No aortic stenosis is present.  8. The inferior vena cava is dilated in size with >50% respiratory variability, suggesting right atrial pressure of 8 mmHg. Comparison(s): No significant change from prior study. Prior images reviewed side by side. FINDINGS  Left Ventricle: No left ventricular thrombus is seen. Left ventricular ejection fraction, by estimation, is 20 to 25%. The left ventricle has severely decreased function. The left ventricle demonstrates global hypokinesis. The left ventricular internal cavity size was mildly dilated. There is no left ventricular hypertrophy. Left ventricular diastolic parameters are consistent with Grade III diastolic dysfunction (restrictive). Elevated left atrial pressure. Right Ventricle: The right ventricular size is moderately enlarged. No increase in right ventricular wall thickness. Right ventricular systolic function is mildly reduced. There is severely elevated pulmonary artery systolic pressure. The tricuspid regurgitant velocity is 4.34 m/s, and with an assumed right atrial pressure of 8 mmHg, the estimated right ventricular systolic  pressure is 123456 mmHg. Left Atrium: Left atrial size was severely dilated. Right Atrium: Right atrial size was severely dilated. Pericardium: There is no evidence of pericardial effusion. Mitral Valve: The mitral valve is normal in structure. Moderate to severe mitral valve regurgitation, with posteriorly-directed jet. Tricuspid Valve: The tricuspid valve is normal in structure.  Tricuspid valve regurgitation is severe. Aortic Valve: The aortic valve is normal in structure. Aortic valve regurgitation is not visualized. No aortic stenosis is present. Aortic valve peak gradient measures 3.1 mmHg. Pulmonic Valve: The pulmonic valve was normal in structure. Pulmonic valve regurgitation is not visualized. Aorta: The aortic root and ascending aorta are structurally normal, with no evidence of dilitation. Venous: A systolic blunting flow pattern is recorded from the right upper pulmonary vein. The inferior vena cava is dilated in size with greater than 50% respiratory variability, suggesting right atrial pressure of 8 mmHg. The inferior vena cava and the hepatic vein show a pattern of systolic flow reversal, suggestive of tricuspid regurgitation. IAS/Shunts: There is right bowing of the interatrial septum, suggestive of elevated left atrial pressure. No atrial level shunt detected by color flow Doppler.  LEFT VENTRICLE PLAX 2D LVIDd:         6.10 cm      Diastology LVIDs:         5.60 cm      LV e' medial:    6.36 cm/s LV PW:         1.10 cm      LV E/e' medial:  13.1 LV IVS:        0.90 cm      LV e' lateral:   6.86 cm/s LVOT diam:     1.90 cm      LV E/e' lateral: 12.1 LV SV:         28 LV SV Index:   15 LVOT Area:     2.84 cm  LV Volumes (MOD) LV vol d, MOD A4C: 159.0 ml LV vol s, MOD A4C: 112.0 ml LV SV MOD A4C:     159.0 ml RIGHT VENTRICLE             IVC RV Basal diam:  3.50 cm     IVC diam: 2.20 cm RV S prime:     11.80 cm/s TAPSE (M-mode): 1.5 cm LEFT ATRIUM              Index        RIGHT ATRIUM           Index LA  diam:        5.40 cm  2.80 cm/m   RA Area:     32.40 cm LA Vol (A2C):   126.0 ml 65.35 ml/m  RA Volume:   122.00 ml 63.27 ml/m LA Vol (A4C):   102.0 ml 52.90 ml/m LA Biplane Vol: 117.0 ml 60.68 ml/m  AORTIC VALVE                 PULMONIC VALVE AV Area (Vmax): 2.32 cm     PV Vmax:       0.55 m/s AV Vmax:        87.80 cm/s   PV Peak grad:  1.2 mmHg AV Peak Grad:   3.1 mmHg LVOT Vmax:      71.70 cm/s LVOT Vmean:     40.500 cm/s LVOT VTI:       0.100 m  AORTA Ao Root diam: 2.90 cm Ao Asc diam:  2.80 cm MITRAL VALVE               TRICUSPID VALVE MV Area (PHT): 7.51 cm    TR Peak grad:   75.3 mmHg MV Decel Time: 101 msec    TR Vmax:        434.00 cm/s MR Peak grad: 97.2  mmHg MR Vmax:      493.00 cm/s  SHUNTS MV E velocity: 83.00 cm/s  Systemic VTI:  0.10 m MV A velocity: 45.90 cm/s  Systemic Diam: 1.90 cm MV E/A ratio:  1.81 Mihai Croitoru MD Electronically signed by Sanda Klein MD Signature Date/Time: 10/01/2021/1:43:13 PM    Final    US Abdomen Limited RUQ (LIVER/GB)  Result Date: 10/01/2021 CLINICAL DATA:  Elevated LFTs. EXAM: ULTRASOUND ABDOMEN LIMITED RIGHT UPPER QUADRANT COMPARISON:  Abdominal ultrasound dated September 13, 2018. FINDINGS: Gallbladder: No gallstones or wall thickening visualized. No sonographic Murphy sign noted by sonographer. Common bile duct: Diameter: 2 mm, normal. Liver: No focal lesion identified. Nodular liver contour. Similar diffusely increased parenchymal echogenicity. Portal vein is patent on color Doppler imaging with normal direction of blood flow towards the liver. Other: Increased small perihepatic ascites. Increased echogenicity of the right kidney. IMPRESSION: 1. No acute abnormality. 2. Cirrhosis with small perihepatic ascites. 3. Increased right renal echogenicity, consistent with medical renal disease. Electronically Signed   By: Titus Dubin M.D.   On: 10/01/2021 10:47    Cardiac Studies   Echo 10/01/2021 1. Left ventricular ejection fraction, by estimation, is  20 to 25%. The  left ventricle has severely decreased function. The left ventricle  demonstrates global hypokinesis. The left ventricular internal cavity size  was mildly dilated. Left ventricular  diastolic parameters are consistent with Grade III diastolic dysfunction  (restrictive). Elevated left atrial pressure.   2. Right ventricular systolic function is mildly reduced. The right  ventricular size is moderately enlarged. There is severely elevated  pulmonary artery systolic pressure.   3. Left atrial size was severely dilated.   4. Right atrial size was severely dilated.   5. The mitral valve is normal in structure. Moderate to severe mitral  valve regurgitation.   6. Tricuspid valve regurgitation is severe.   7. The aortic valve is normal in structure. Aortic valve regurgitation is  not visualized. No aortic stenosis is present.   8. The inferior vena cava is dilated in size with >50% respiratory  variability, suggesting right atrial pressure of 8 mmHg.   Comparison(s): No significant change from prior study. Prior images  reviewed side by side.   Patient Profile     61 y.o. male with PMH of chronic systolic CHF, NICM, HTN, NSVT, EtOh abuse, CKD stage III and nonobstructive CAD presented with CHF exacerbation. UDT positive for cocaine. He is also noncompliant with cardiac medications.   Assessment & Plan    Acute on chronic systolic heart failure  - felt primarily related to EtOH abuse. Prior cath showed nonobstructive CAD.   - EF was 20% on echo in Jan 2020. Previous echo 06/23/21 showed EF 20-25%. Repeat echo 10/01/2021 EF 20-25%, grade 3 DD, severely elevated pulm artery systolic pressure, severe biatrial enlargement, moderate to severe MR, severe TR.   - noncompliant with cardiac medication. Long discussion today the importance of medication compliance, and quit EtoH and cocaine.   - I/O not accurate as patient urinated directly into the toilet. Today's weight is not  documented, need daily weight.   - LE skin very tight, renal function stable, recommend continue IV diuresis. Continue coreg, ARB and spironolactone.   NICM: no significant CAD on previous cath. EF remain low since 2020. Occasional NSVT on telemetry, however ICD is not indicated at this time due to noncompliance.   HTN: coreg, ARB and spironolactone.   Polysubstance abuse: EtOH and cocaine cessation discussed.   Nonobstructive CAD  Cirrhosis: with small ascites seen on limited echo 10/01/2021  HLD      For questions or updates, please contact [place] Please consult [email or web address] for contact info under        Signed, Almyra Deforest, [place]  10/02/2021, 1:45 PM    Patient seen and examined and agree with Almyra Deforest, PA as detailed above.  In brief, the patient is a 61 y.o. male with PMH of chronic systolic CHF due to NICM thought to be secondary to alcohol abuse, HTN, NSVT, EtOh abuse, CKD stage III and nonobstructive CAD presented with CHF exacerbation in the setting of medication noncompliance and continued cocaine and alcohol use. Cardiology has been consulted for further management of HFrEF.  Patient with known history of nonischemic CM with EF persistently in 20-25% thought to be due to alcohol abuse. Has had frequent readmission for acute decompensation in the setting of medication noncompliance and continued polysubstance abuse. He presented on this admission with worsening dyspnea and LE edema after not taking his medications. UDT  positive for cocaine.    Currently, the patient is being diuresed with lasix 60mg  IV BID with inaccurate I/Os as patient was not saving his urine and no daily weight. We have resumed his HF medications. Long discussion held today about the severity of his heart failure and high risk of mortality if he continues to not take his medications and abuses alcohol and cocaine.   GEN: Laying in bed, NAD  Neck: JVD to angle of his mandible Cardiac: RRR, no  murmurs, rubs, or gallops.  Respiratory: Clear to auscultation bilaterally. GI: Soft, nontender, non-distended  MS: 2+ pitting edema, warm Neuro:  Nonfocal  Psych: Normal affect    Plan: -Continue diuresis with lasix 60mg  IV BID -Continue irbesartan 20mg  daily -Continue spironolactone 12.5mg  daily -Continue coreg 6.25mg  BID; avoiding beta selective agents due to cocaine abuse -Patient is not a ICD candidate due to noncompliance -Medication noncompliance and continued polysubstance abuse remains a major barrier to care; discussed extensively today -Monitor I/Os and daily standing weights  Gwyndolyn Kaufman, MD

## 2021-10-02 NOTE — Assessment & Plan Note (Signed)
Likely from CHF exacerbation. Abdominal ultrasound shows cirrhosis, mild ascites Hepatitis panel negative.  Continue to trend LFTs

## 2021-10-02 NOTE — Assessment & Plan Note (Signed)
Counseled in detail about medication compliance.

## 2021-10-02 NOTE — Assessment & Plan Note (Signed)
Found to have  stable pleural effusion from prior exam No evidence of loculation. Continue IV diuresis.  He appears comfortable, respiratory status stable. Consider thoracocentesis if develops shortness of breath.

## 2021-10-02 NOTE — Plan of Care (Signed)
°  Problem: Activity: Goal: Capacity to carry out activities will improve Outcome: Progressing   Problem: Education: Goal: Knowledge of General Education information will improve Description: Including pain rating scale, medication(s)/side effects and non-pharmacologic comfort measures Outcome: Progressing   Problem: Nutrition: Goal: Adequate nutrition will be maintained Outcome: Progressing

## 2021-10-03 MED ORDER — CARVEDILOL 3.125 MG PO TABS
3.1250 mg | ORAL_TABLET | Freq: Two times a day (BID) | ORAL | Status: DC
  Filled 2021-10-03: qty 1

## 2021-10-03 NOTE — Progress Notes (Signed)
Progress Note  Patient Name: Daryl [name] Date of Encounter: 10/03/2021  [facility] Cardiologist: Minus Breeding, MD   Subjective   Feeling better. Breathing improving.  Inpatient Medications    Scheduled Meds:  atorvastatin  40 mg Oral Daily   carvedilol  6.25 mg Oral BID WC   enoxaparin (LOVENOX) injection  40 mg Subcutaneous Daily   furosemide  60 mg Intravenous BID   irbesartan  75 mg Oral Daily   spironolactone  12.5 mg Oral Daily   Continuous Infusions:  PRN Meds:    Vital Signs    Vitals:   10/02/21 2118 10/03/21 0239 10/03/21 0500 10/03/21 0938  BP: 90/67 97/76 98/67  105/72  Pulse: 80 78 76 82  Resp:      Temp: 97.7 F (36.5 C) 97.6 F (36.4 C) 97.8 F (36.6 C)   TempSrc: Oral Oral Oral   SpO2: 94% 100% 97%   Weight:      Height:        Intake/Output Summary (Last 24 hours) at 10/03/2021 1155 Last data filed at 10/03/2021 0945 Gross per 24 hour  Intake 496 ml  Output --  Net 496 ml   Last 3 Weights 10/01/2021 09/05/2021 08/30/2021  Weight (lbs) 176 lb 12.9 oz 174 lb 6.4 oz 176 lb 12.8 oz  Weight (kg) 80.2 kg 79.107 kg 80.196 kg      Telemetry    NSR, frequent PVCs with runs of NSVT - Personally Reviewed  ECG    NSR with anterior q waves - Personally Reviewed  Physical Exam   GEN: Comfortable, NAD Neck: JVD to mid-neck Cardiac: RRR, no murmurs, rubs, or gallops.  Respiratory: Clear to auscultation bilaterally. GI: Soft, nontender, non-distended  MS: 1+ pitting edema, warm Neuro:  Nonfocal  Psych: Normal affect   Labs    High Sensitivity Troponin:  No results for input(s): TROPONINIHS in the last 720 hours.   Chemistry Recent Labs  Lab 10/01/21 0119 10/01/21 1325 10/02/21 0439  NA 140  --  137  K 4.8  --  4.4  CL 108  --  100  CO2 22  --  28  GLUCOSE 108*  --  85  BUN 17  --  21*  CREATININE 1.11 1.25* 1.27*  CALCIUM 9.2  --  8.2*  PROT 7.1  --  5.7*  ALBUMIN 3.7  --  2.9*  AST 60*  --  52*  ALT 44  --  33  ALKPHOS 128*   --  107  BILITOT 2.5* 2.4* 1.7*  GFRNONAA >60 >60 >60  ANIONGAP 10  --  9    Lipids No results for input(s): CHOL, TRIG, HDL, LABVLDL, LDLCALC, CHOLHDL in the last 168 hours.  Hematology Recent Labs  Lab 10/01/21 0119 10/01/21 1325 10/02/21 0439  WBC 9.6 8.6 6.7  RBC 6.47* 5.81 5.36  HGB 18.5* 16.7 15.1  HCT 56.9* 50.5 46.6  MCV 87.9 86.9 86.9  MCH 28.6 28.7 28.2  MCHC 32.5 33.1 32.4  RDW 16.6* 15.4 14.8  PLT 296 289 252   Thyroid No results for input(s): TSH, FREET4 in the last 168 hours.  BNP Recent Labs  Lab 10/01/21 0119  BNP 3,514.2*    DDimer No results for input(s): DDIMER in the last 168 hours.   Radiology    No results found.  Cardiac Studies   Echo 10/01/2021 1. Left ventricular ejection fraction, by estimation, is 20 to 25%. The  left ventricle has severely decreased function. The left  ventricle  demonstrates global hypokinesis. The left ventricular internal cavity size  was mildly dilated. Left ventricular  diastolic parameters are consistent with Grade III diastolic dysfunction  (restrictive). Elevated left atrial pressure.   2. Right ventricular systolic function is mildly reduced. The right  ventricular size is moderately enlarged. There is severely elevated  pulmonary artery systolic pressure.   3. Left atrial size was severely dilated.   4. Right atrial size was severely dilated.   5. The mitral valve is normal in structure. Moderate to severe mitral  valve regurgitation.   6. Tricuspid valve regurgitation is severe.   7. The aortic valve is normal in structure. Aortic valve regurgitation is  not visualized. No aortic stenosis is present.   8. The inferior vena cava is dilated in size with >50% respiratory  variability, suggesting right atrial pressure of 8 mmHg.   Comparison(s): No significant change from prior study. Prior images  reviewed side by side.     Patient Profile     61 y.o. male with PMH of chronic systolic CHF due to NICM  thought to be secondary to alcohol abuse, HTN, NSVT, EtOh abuse, CKD stage III and nonobstructive CAD presented with CHF exacerbation in the setting of medication noncompliance and continued cocaine and alcohol use. Cardiology has been consulted for further management of HFrEF.  Assessment & Plan    #Acute on chronic systolic heart failure: #NICM Patient with known history of NICM thought to be primarily driven by alcohol abuse. Prior cath with nonobstructive CAD. TTE with stable EF 20-25%, global hypokinesis, G3DDD, mildly reduced RV systolic function, severe PHTN, severe biatrial enlargement, moderate-to-severe MR, severe TR, RAP 56mmHg. Has been noncompliant with medications and continues to abuse cocaine and alcohol leading to frequent readmissions. Presented on this admission with worsening SOB, LE edema in the setting of medication non-adherence consistent with acute on chronic HFrEF exacerbation. Now improving with IV diuresis. -Continue diuresis with lasix 60mg  IV BID; possibly transition to PO tomorrow vs Sat -Continue irbesartan 20mg  daily -Continue spironolactone 12.5mg  daily -Continue coreg 6.25mg  BID; avoiding beta selective agents due to cocaine abuse -Medication noncompliance is a major barrier to care leading to frequent readmissions -Also with continued alcohol and cocaine abuse; cessation recommended -Not ICD candidate currently due to noncompliance; can consider in the future if he maintains regular follow-up -Monitor I/Os and daily weights   #HTN: -Continue irbesartan 20mg  daily -Continue spironolactone 12.5mg  daily -Continue coreg 6.25mg  BID; avoiding beta selective agents due to cocaine abuse   #Frequent PVCs: -Continue coreg 6.25mg  BID -Not ICD candidate currently due to noncompliance; can consider in future if follows up regularly and compliant with meds   #Nonobstructive CAD He denies chest pain.  -Continue lipitor 40mg  daily   #Hyperlipidemia with LDL goal less  than 70 Noncompliant with statin. -Continue lipitor 40mg  daily   #CKD IIIA: -Monitor with diuresis     For questions or updates, please contact [place] [facility] Please consult [email or web address] for contact info under        Signed, Freada Bergeron, MD  10/03/2021, 11:55 AM

## 2021-10-03 NOTE — Progress Notes (Deleted)
Cardiology Office Note   Date:  10/03/2021   ID:  [name], DOB [birth year], MRN [id number]  PCP:  Patient, No Pcp Per (Inactive)  Cardiologist:   Minus Breeding, MD   No chief complaint on file.      History of Present Illness: Daryl [name] is [name] 61 y.o. male who presents for  follow up post sepsis and acute hypoxic respiratory failure in the setting of rhinovirus infection, superimposed on bacterial infection.   This was in Jan 2020.  EF was 20%.  He had coronary calcification on CT.  He underwent [name] RHC/LHC revealed non-obstructive CAD with no more than 20% stenosis in any of his coronary arteries. He had moderate to severe MR. There was evidence of coronary spasm relieved with NTG. 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 He was placed on Bidil and lasix. No ACE or ARB due to CKD.  However I reviewed notes from October/Nov when he was in the hospital last time.  He was hospitalized with acute on chronic systolic heart failure and also had alcohol withdrawal delirium.  He was actually discharged on Avapro along with other medicines listed below.  He has had many ER visits and behavioral health visits.   He presents for follow up.  At the last visit I was able to increase his Avapro.  However, we were not able to draw blood.  ***   *** I was able to start Lasix and have another frank conversation with him about his risk of dying with his nonadherence and his severe cardiomyopathy.  I think he is taking his medications as prescribed.  However, he is having [name] problem with frequent urination and he is bothered by this.  He does have lower extremity swelling.  He is not describing any new shortness of breath, PND or orthopnea.  He says he has not been drinking alcohol.  He has not had any palpitations, presyncope or syncope.  He has had no chest pain.  Of note his weight is down [name] few pounds since I saw him.   Past Medical History:  Diagnosis Date   Asthma     Chronic combined systolic and diastolic CHF (congestive heart failure) ([place])    Dyslipidemia    Essential hypertension 11/02/2018   ETOH abuse 02/14/2020   Nonobstructive CAD    Non-obstructive disease on cardiac cath in 08/2018   NSVT (nonsustained ventricular tachycardia)    PNA (pneumonia) 09/11/2018   Sepsis (Daryl [name]) 09/11/2018   Stage 3a chronic kidney disease (Daryl [name]) 02/19/2020   Tobacco abuse 09/11/2018    Past Surgical History:  Procedure Laterality Date   IR THORACENTESIS ASP PLEURAL SPACE W/IMG GUIDE  09/13/2018   RIGHT/LEFT HEART CATH AND CORONARY ANGIOGRAPHY N/[name] 09/15/2018   Procedure: RIGHT/LEFT HEART CATH AND CORONARY ANGIOGRAPHY;  Surgeon: Daryl Man, MD;  Location: [facility];  Service: Cardiovascular;  Laterality: N/[name];     No current facility-administered medications for this visit.   No current outpatient medications on file.   Facility-Administered Medications Ordered in Other Visits  Medication Dose Route Frequency Provider Last Rate Last Admin   atorvastatin (LIPITOR) tablet 40 mg  40 mg Oral Daily Daryl Bergeron, MD   40 mg at 10/03/21 0933   enoxaparin (LOVENOX) injection 40 mg  40 mg Subcutaneous Daily Kyle, Tyrone A, DO   40 mg at 10/03/21 0938   furosemide (LASIX) injection 60 mg  60 mg Intravenous BID  Cherylann Ratel A, DO   60 mg at 10/03/21 1093   spironolactone (ALDACTONE) tablet 12.5 mg  12.5 mg Oral Daily Daryl Bergeron, MD   12.5 mg at 10/03/21 2355    Allergies:   Penicillins    ROS:  Please see the history of present illness.   Otherwise, review of systems are positive for mucous in his *** .   All other systems are reviewed and negative.    PHYSICAL has been back on him now just to be met: VS:  There were no vitals taken for this visit. , BMI There is no height or weight on file to calculate BMI. GENERAL:  Well appearing NECK:  No jugular venous distention, waveform within normal limits, carotid upstroke brisk and symmetric,  no bruits, no thyromegaly LUNGS:  Clear to auscultation bilaterally CHEST:  Unremarkable HEART:  PMI not displaced or sustained,S1 and S2 within normal limits, no S3, no S4, no clicks, no rubs, *** murmurs ABD:  Flat, positive bowel sounds normal in frequency in pitch, no bruits, no rebound, no guarding, no midline pulsatile mass, no hepatomegaly, no splenomegaly EXT:  2 plus pulses throughout, no edema, no cyanosis no clubbing    ***GENERAL:  Well appearing NECK:  No jugular venous distention, waveform within normal limits, carotid upstroke brisk and symmetric, no bruits, no thyromegaly LUNGS:  Clear to auscultation bilaterally CHEST:  Unremarkable HEART:  PMI not displaced or sustained,S1 and S2 within normal limits, no S3, no S4, no clicks, no rubs, 3 out of 6 systolic murmur radiating slightly at the aortic outflow tract, no diastolic murmurs ABD:  Flat, positive bowel sounds normal in frequency in pitch, no bruits, no rebound, no guarding, no midline pulsatile mass, no hepatomegaly, no splenomegaly EXT:  2 plus pulses throughout, moderate leg edema, no cyanosis no clubbing   EKG:  EKG is *** ordered today. ***   Recent Labs: 05/17/2021: TSH 3.640 06/26/2021: Magnesium 2.0 10/01/2021: B Natriuretic Peptide 3,514.2 10/02/2021: ALT 33; BUN 21; Creatinine, Ser 1.27; Hemoglobin 15.1; Platelets 252; Potassium 4.4; Sodium 137    Lipid Panel    Component Value Date/Time   CHOL 127 09/15/2018 0441   TRIG 124 09/15/2018 0441   HDL 33 (L) 09/15/2018 0441   CHOLHDL 3.8 09/15/2018 0441   VLDL 25 09/15/2018 0441   LDLCALC 69 09/15/2018 0441      Wt Readings from Last 3 Encounters:  10/01/21 176 lb 12.9 oz (80.2 kg)  09/05/21 174 lb 6.4 oz (79.1 kg)  08/30/21 176 lb 12.8 oz (80.2 kg)      Other studies Reviewed: Additional studies/ records that were reviewed today include:   *** Review of the above records demonstrates:  ***   ASSESSMENT AND PLAN:    NICM:    He was to get  blood work at the hospital.  ***  Unfortunately we were not able to get blood work today because he is [name] difficult stick.  I am going to send him to the hospital to get this done in about 7 days.  I am going to take this opportunity to increase his Avapro to [name] full pill.  I think he has been compliant with medications and his blood pressure should tolerate.   We talked about keeping his feet elevated.  He does not want to wear compression stockings.  Hopefully if he stays compliant with these visits there is [name] better chance of him surviving with his severe nonischemic cardiomyopathy.    Syncope:   ***  He is not having any syncope.  He has [name] history of nonsustained ventricular tachycardia.  No change in therapy at this point pending response to med titration.   Hypertension:    ***  This is being managed in the context of treating his CHF    CKD:     CKD IIIa.  ***  I will check this as above.  ETOH Abuse:   ***  He reports that he is abstaining.  Current medicines are reviewed at length with the patient today.  The patient does not have concerns regarding medicines.  The following changes have been made:   ***   Labs/ tests ordered today include:   ***  No orders of the defined types were placed in this encounter.    Disposition:   FU with me or *** in one month.   Signed, Minus Breeding, MD  10/03/2021 8:58 PM    [place]

## 2021-10-03 NOTE — Progress Notes (Signed)
Pt's presented with low bp, VS in flowsheet. MD made aware, verbal to hold coreg and lasix. Yellow MEWs protocol started and also continuing Q2 vs to closely monitor. Night shift nurse and nurse tech made aware.  Jerene Pitch

## 2021-10-03 NOTE — Progress Notes (Signed)
PROGRESS NOTE    Daryl [name]  OBS:[id number] DOB: Dec 11, 1960 DOA: 09/30/2021 PCP: Patient, No Pcp Per (Inactive)     Brief Narrative:  This 61 years old male with PMH significant for HFr EF(LVEF 20 to 25%) hypertension, hyperlipidemia, cocaine abuse presented in the ED with worsening shortness of breath for 2 days.  He denies any chest pain, lightheadedness or palpitations.  He has noticed some leg swelling and weight gain.  Patient also reports vague abdominal pain.  He has not been taking his Lasix or blood pressure medication because he does not know which medications to take.  Patient also reports recent cocaine use. Patient is admitted for acute on chronic systolic CHF requiring IV diuresis.   Subjective:  He is on room air, continue to have bilateral lower extremity pitting edema Not a reliable historian Creatinine slightly up   Assessment and Plan: * Acute on chronic systolic CHF (congestive heart failure) (HCC)- (present on admission) Patient presented with worsening shortness of breath, pedal edema and weight gain. Patient has been noncompliant with his CHF medications. BNP 3514.2,  CXR: Large Right pleural effusion same as prior chest x-ray. Continue IV diuresis with the Lasix 60 mg IV twice daily. Daily weight, intake output charting. Repeat 2D echo shows LVEF 20 to 25%. Counseled against cocaine use. Continue irbesartan, spironolactone and Coreg. Cardiology consulted and appreciate recommendation.  Pleural effusion due to CHF (congestive heart failure) (HCC) Found to have  stable pleural effusion from prior exam No evidence of loculation. Continue IV diuresis.  He appears comfortable, respiratory status stable. Consider thoracocentesis if develops shortness of breath.  Elevated LFTs Likely from CHF exacerbation. Abdominal ultrasound shows cirrhosis, mild ascites Hepatitis panel negative.  Continue to trend LFTs  History of noncompliance with medical  treatment Counseled in detail about medication compliance.  Cocaine abuse (HCC) Urine drug screen+ cocaine. Counseled against further cocaine use.  Stage 3a chronic kidney disease (HCC)- (present on admission) Serum creatinine at baseline (1.1-1.27). Avoid nephrotoxic medications.  Essential hypertension- (present on admission) Continue irbesartan , spironolactone Metoprolol discontinued due to cocaine use. Continue Coreg 6.25 mg twice daily.       Principal Problem:   Acute on chronic systolic CHF (congestive heart failure) (HCC) Active Problems:   Essential hypertension   Stage 3a chronic kidney disease (HCC)   Cocaine abuse (HCC)   History of noncompliance with medical treatment   Elevated LFTs   Pleural effusion due to CHF (congestive heart failure) (HCC)       I ordered the following labs:  Unresulted Labs (From admission, onward)     Start     Ordered   10/08/21 0500  Creatinine, serum  (enoxaparin (LOVENOX)    CrCl >/= 30 ml/min)  Weekly,   R     Comments: while on enoxaparin therapy    10/01/21 0931              DVT prophylaxis: enoxaparin (LOVENOX) injection 40 mg Start: 10/01/21 1300   Code Status:   Code Status: Full Code  Family Communication: Patient Disposition:   Status is: Inpatient Dispo: The patient is from: Home              Anticipated d/c is to: Home              Anticipated d/c date is: TBD    Objective: Vitals:   10/03/21 0938 10/03/21 1240 10/03/21 1339 10/03/21 1548  BP: 105/72 (!) 79/52 (!) 83/64 97/71  Pulse: 82  65 74 86  Resp:  (!) 23 19 (!) 22  Temp:  97.8 F (36.6 C) 98.6 F (37 C) 97.8 F (36.6 C)  TempSrc:  Oral Oral Oral  SpO2:  96% 96% 95%  Weight:      Height:        Intake/Output Summary (Last 24 hours) at 10/03/2021 1628 Last data filed at 10/03/2021 0945 Gross per 24 hour  Intake 376 ml  Output --  Net 376 ml   Filed Weights   10/01/21 1843  Weight: 80.2 kg    Examination:  General exam:  alert, awake, NAD, not a reliable historian Respiratory system: Clear to auscultation. Respiratory effort normal. Cardiovascular system:  RRR.  Gastrointestinal system: Abdomen is nondistended, soft and nontender.  Normal bowel sounds heard. Central nervous system: Alert and oriented. No focal neurological deficits. Extremities: Persistent bilateral lower extremity edema Skin: No rashes, lesions or ulcers Psychiatry: Does not participate in conversation too much, not a reliable historian, currently no agitation.     Data Reviewed: I have personally reviewed  labs and visualized  imaging studies since the last encounter and formulate the plan        Scheduled Meds:  atorvastatin  40 mg Oral Daily   carvedilol  3.125 mg Oral BID WC   enoxaparin (LOVENOX) injection  40 mg Subcutaneous Daily   furosemide  60 mg Intravenous BID   spironolactone  12.5 mg Oral Daily   Continuous Infusions:   LOS: 2 days      Florencia Reasons, MD PhD FACP [facility]  Available via Epic secure chat 7am-7pm for nonurgent issues Please page for urgent issues To page the attending provider between 7A-7P or the covering provider during after hours 7P-7A, please log into the web site [email or web address] and access using universal [place] password for that web site. If you do not have the password, please call the hospital operator.    10/03/2021, 4:28 PM

## 2021-10-04 ENCOUNTER — Ambulatory Visit: Payer: Medicaid Other | Admitting: Cardiology

## 2021-10-04 LAB — BASIC METABOLIC PANEL
Anion gap: 8 (ref 5–15)
BUN: 23 mg/dL — ABNORMAL HIGH (ref 6–20)
CO2: 28 mmol/L (ref 22–32)
Calcium: 8.1 mg/dL — ABNORMAL LOW (ref 8.9–10.3)
Chloride: 100 mmol/L (ref 98–111)
Creatinine, Ser: 0.92 mg/dL (ref 0.61–1.24)
GFR, Estimated: >60 mL/min (ref >60)
Glucose, Bld: 109 mg/dL — ABNORMAL HIGH (ref 70–99)
Potassium: 3.7 mmol/L (ref 3.5–5.1)
Sodium: 136 mmol/L (ref 135–145)

## 2021-10-04 MED ORDER — FUROSEMIDE 10 MG/ML IJ SOLN
40.0000 mg | Freq: Two times a day (BID) | INTRAMUSCULAR | Status: DC
  Administered 2021-10-04 – 2021-10-05 (×2): 40 mg via INTRAVENOUS
  Filled 2021-10-04: qty 4

## 2021-10-04 MED ORDER — POTASSIUM CHLORIDE CRYS ER 20 MEQ PO TBCR
20.0000 meq | EXTENDED_RELEASE_TABLET | Freq: Once | ORAL | Status: AC
  Administered 2021-10-04: 20 meq via ORAL
  Filled 2021-10-04: qty 1

## 2021-10-04 NOTE — Progress Notes (Signed)
PROGRESS NOTE    Daryl [name]  FMM:[id number] DOB: 02/24/1961 DOA: 09/30/2021 PCP: Patient, No Pcp Per (Inactive)     Brief Narrative:  This 61 years old male with PMH significant for HFr EF(LVEF 20 to 25%) hypertension, hyperlipidemia, cocaine abuse presented in the ED with worsening shortness of breath for 2 days.  He denies any chest pain, lightheadedness or palpitations.  He has noticed some leg swelling and weight gain.  Patient also reports vague abdominal pain.  He has not been taking his Lasix or blood pressure medication because he does not know which medications to take.  Patient also reports recent cocaine use. Patient is admitted for acute on chronic systolic CHF requiring IV diuresis.   Subjective:  He is on room air, continue to have bilateral lower extremity pitting edema Not a reliable historian Creatinine is better today   Assessment and Plan:  * Acute on chronic systolic CHF (congestive heart failure) (HCC)- (present on admission) Patient presented with worsening shortness of breath, pedal edema and weight gain. Patient has been noncompliant with his CHF medications. BNP 3514.2,  CXR: Large Right pleural effusion same as prior chest x-ray. Repeat 2D echo shows LVEF 20 to 25%. Continue IV diuresis, management per cardiology   Pleural effusion due to CHF (congestive heart failure) (HCC) Found to have  stable pleural effusion from prior exam No evidence of loculation. Continue IV diuresis.  He appears comfortable, respiratory status stable. Consider thoracocentesis if develops shortness of breath.  Elevated LFTs Likely from CHF exacerbation. Abdominal ultrasound shows cirrhosis, mild ascites Hepatitis panel negative.  Continue to trend LFTs  Stage 3a chronic kidney disease (HCC)- (present on admission) Serum creatinine at baseline (1.1-1.27). Avoid nephrotoxic medications.  Essential hypertension- (present on admission) Low normal bp, currently only on iv  lasix  History of noncompliance with medical treatment Counseled in detail about medication compliance.  Cocaine abuse (HCC) Urine drug screen+ cocaine. Counseled against further cocaine use.       Principal Problem:   Acute on chronic systolic CHF (congestive heart failure) (HCC) Active Problems:   Essential hypertension   Stage 3a chronic kidney disease (HCC)   Cocaine abuse (HCC)   History of noncompliance with medical treatment   Elevated LFTs   Pleural effusion due to CHF (congestive heart failure) (HCC)       I ordered the following labs:  Unresulted Labs (From admission, onward)     Start     Ordered   10/08/21 0500  Creatinine, serum  (enoxaparin (LOVENOX)    CrCl >/= 30 ml/min)  Weekly,   R     Comments: while on enoxaparin therapy    10/01/21 0931   10/04/21 0500  Basic metabolic panel  Daily,   R      10/03/21 1630              DVT prophylaxis: enoxaparin (LOVENOX) injection 40 mg Start: 10/01/21 1300   Code Status:   Code Status: Full Code  Family Communication: Patient Disposition:   Status is: Inpatient Dispo: The patient is from: Home              Anticipated d/c is to: Home              Anticipated d/c date is: TBD    Objective: Vitals:   10/04/21 0955 10/04/21 1123 10/04/21 1419 10/04/21 1525  BP: 96/78 93/67 100/76 104/89  Pulse: 81 76 86 99  Resp: 16 18 (!) 26 20  Temp: (!) 97.5 F (36.4 C) (!) 97.5 F (36.4 C) 98.4 F (36.9 C) 97.6 F (36.4 C)  TempSrc: Oral Oral Oral Oral  SpO2: 98% 100% 100% 99%  Weight:      Height:        Intake/Output Summary (Last 24 hours) at 10/04/2021 1727 Last data filed at 10/04/2021 1215 Gross per 24 hour  Intake 716 ml  Output --  Net 716 ml   Filed Weights   10/01/21 1843  Weight: 80.2 kg    Examination:  General exam: alert, awake, NAD, not a reliable historian Respiratory system: Clear to auscultation. Respiratory effort normal. Cardiovascular system:  RRR.   Gastrointestinal system: Abdomen is nondistended, soft and nontender.  Normal bowel sounds heard. Central nervous system: Alert and oriented. No focal neurological deficits. Extremities: Persistent bilateral lower extremity edema Skin: No rashes, lesions or ulcers Psychiatry: Does not participate in conversation too much, not a reliable historian, currently no agitation.     Data Reviewed: I have personally reviewed  labs and visualized  imaging studies since the last encounter and formulate the plan        Scheduled Meds:  atorvastatin  40 mg Oral Daily   enoxaparin (LOVENOX) injection  40 mg Subcutaneous Daily   furosemide  40 mg Intravenous BID   Continuous Infusions:   LOS: 3 days      Florencia Reasons, MD PhD FACP [facility]  Available via Epic secure chat 7am-7pm for nonurgent issues Please page for urgent issues To page the attending provider between 7A-7P or the covering provider during after hours 7P-7A, please log into the web site [email or web address] and access using universal [place] password for that web site. If you do not have the password, please call the hospital operator.    10/04/2021, 5:27 PM

## 2021-10-04 NOTE — Progress Notes (Signed)
Progress Note  Patient Name: Daryl [name] Date of Encounter: 10/05/2021  [facility] Cardiologist: Rollene Rotunda, MD   Subjective   Feeling better. States he has to leave the hospital tomorrow due to a hearing he has to make it to. Remains volume up  No labs back this morning Lasix dosing was decreased slightly due to soft blood pressures and dizziness Antihypertensives held to allow room for diuresis  Inpatient Medications    Scheduled Meds:  atorvastatin  40 mg Oral Daily   enoxaparin (LOVENOX) injection  40 mg Subcutaneous Daily   furosemide  40 mg Intravenous BID   Continuous Infusions:  PRN Meds:    Vital Signs    Vitals:   10/05/21 0201 10/05/21 0423 10/05/21 0552 10/05/21 0620  BP: 110/84 115/81  110/87  Pulse: 93 99  95  Resp: (!) 24 20  (!) 21  Temp: 98.6 F (37 C) 98.4 F (36.9 C)  98.9 F (37.2 C)  TempSrc: Oral Oral  Oral  SpO2: 96% 95%  96%  Weight:   79.8 kg   Height:        Intake/Output Summary (Last 24 hours) at 10/05/2021 0948 Last data filed at 10/05/2021 0200 Gross per 24 hour  Intake 430 ml  Output --  Net 430 ml   Last 3 Weights 10/05/2021 10/01/2021 09/05/2021  Weight (lbs) 175 lb 14.8 oz 176 lb 12.9 oz 174 lb 6.4 oz  Weight (kg) 79.8 kg 80.2 kg 79.107 kg      Telemetry    NSR, frequent PVCs with runs of NSVT - Personally Reviewed  ECG    No new tracing - Personally Reviewed  Physical Exam   GEN: Comfortable, NAD Neck: JVD to mid-neck Cardiac: RRR, no murmurs, rubs, or gallops.  Respiratory: Crackles at the bases GI: Soft, nontender, non-distended  MS: 1+ pitting edema, warm Neuro:  Nonfocal  Psych: Normal affect   Labs    High Sensitivity Troponin:  No results for input(s): TROPONINIHS in the last 720 hours.   Chemistry Recent Labs  Lab 10/01/21 0119 10/01/21 1325 10/02/21 0439 10/04/21 0452  NA 140  --  137 136  K 4.8  --  4.4 3.7  CL 108  --  100 100  CO2 22  --  28 28  GLUCOSE 108*  --  85 109*  BUN  17  --  21* 23*  CREATININE 1.11 1.25* 1.27* 0.92  CALCIUM 9.2  --  8.2* 8.1*  PROT 7.1  --  5.7*  --   ALBUMIN 3.7  --  2.9*  --   AST 60*  --  52*  --   ALT 44  --  33  --   ALKPHOS 128*  --  107  --   BILITOT 2.5* 2.4* 1.7*  --   GFRNONAA >60 >60 >60 >60  ANIONGAP 10  --  9 8    Lipids No results for input(s): CHOL, TRIG, HDL, LABVLDL, LDLCALC, CHOLHDL in the last 168 hours.  Hematology Recent Labs  Lab 10/01/21 0119 10/01/21 1325 10/02/21 0439  WBC 9.6 8.6 6.7  RBC 6.47* 5.81 5.36  HGB 18.5* 16.7 15.1  HCT 56.9* 50.5 46.6  MCV 87.9 86.9 86.9  MCH 28.6 28.7 28.2  MCHC 32.5 33.1 32.4  RDW 16.6* 15.4 14.8  PLT 296 289 252   Thyroid No results for input(s): TSH, FREET4 in the last 168 hours.  BNP Recent Labs  Lab 10/01/21 0119  BNP 3,514.2*  DDimer No results for input(s): DDIMER in the last 168 hours.   Radiology    No results found.  Cardiac Studies   Echo 10/01/2021 1. Left ventricular ejection fraction, by estimation, is 20 to 25%. The  left ventricle has severely decreased function. The left ventricle  demonstrates global hypokinesis. The left ventricular internal cavity size  was mildly dilated. Left ventricular  diastolic parameters are consistent with Grade III diastolic dysfunction  (restrictive). Elevated left atrial pressure.   2. Right ventricular systolic function is mildly reduced. The right  ventricular size is moderately enlarged. There is severely elevated  pulmonary artery systolic pressure.   3. Left atrial size was severely dilated.   4. Right atrial size was severely dilated.   5. The mitral valve is normal in structure. Moderate to severe mitral  valve regurgitation.   6. Tricuspid valve regurgitation is severe.   7. The aortic valve is normal in structure. Aortic valve regurgitation is  not visualized. No aortic stenosis is present.   8. The inferior vena cava is dilated in size with >50% respiratory  variability, suggesting right  atrial pressure of 8 mmHg.   Comparison(s): No significant change from prior study. Prior images  reviewed side by side.     Patient Profile     61 y.o. male with PMH of chronic systolic CHF due to NICM thought to be secondary to alcohol abuse, HTN, NSVT, EtOh abuse, CKD stage III and nonobstructive CAD presented with CHF exacerbation in the setting of medication noncompliance and continued cocaine and alcohol use. Cardiology has been consulted for further management of HFrEF.  Assessment & Plan    #Acute on chronic systolic heart failure: #NICM Patient with known history of NICM thought to be primarily driven by alcohol abuse. Prior cath with nonobstructive CAD. TTE with stable EF 20-25%, global hypokinesis, G3DDD, mildly reduced RV systolic function, severe PHTN, severe biatrial enlargement, moderate-to-severe MR, severe TR, RAP 40mmHg. Has been noncompliant with medications and continues to abuse cocaine and alcohol leading to frequent readmissions. Presented on this admission with worsening SOB, LE edema in the setting of medication non-adherence consistent with acute on chronic HFrEF exacerbation. Now improving with IV diuresis. -Continue lasix 40mg  IV BID -Will hold GDMT given hypotension to allow room for diuresis; will add as able -Continue alcohol and tobacco cessation counseling -Not ICD candidate currently due to noncompliance; can consider in the future if he maintains regular follow-up -Monitor I/Os and daily weights -Discussed that it is too early for him to leave tomorrow, but if this occurs, will try to arrange for close HF clinic follow-up   #HTN: Holding due to soft pressures   #Frequent PVCs: -Holding BB due to soft blood pressures -Keep K>4, Mg>2 -Not ICD candidate currently due to noncompliance; can consider in future if follows up regularly and compliant with meds   #Nonobstructive CAD He denies chest pain.  -Continue lipitor 40mg  daily   #Hyperlipidemia with  LDL goal less than 70 Noncompliant with statin. -Continue lipitor 40mg  daily   #CKD IIIA: -Monitor with diuresis     For questions or updates, please contact [place] Please consult [email or web address] for contact info under        Signed, Freada Bergeron, MD  10/05/2021, 9:48 AM

## 2021-10-04 NOTE — Plan of Care (Signed)
  Problem: Activity: Goal: Capacity to carry out activities will improve Outcome: Progressing   Problem: Education: Goal: Knowledge of General Education information will improve Description: Including pain rating scale, medication(s)/side effects and non-pharmacologic comfort measures Outcome: Progressing   

## 2021-10-04 NOTE — Progress Notes (Signed)
Pt. Refused to have VS taken.  Due to hypotension, we are attempting to take VS  Q 2 hours for patient safety.  Explained to patient importance of monitoring BP.  Will attempt to take again in 2 hours.

## 2021-10-04 NOTE — Progress Notes (Signed)
Progress Note  Patient Name: Daryl [name] Date of Encounter: 10/04/2021  [facility] Cardiologist: Minus Breeding, MD   Subjective   Feels okay today. Breathing improving. Was dizzy this AM with soft blood pressures which has since resolved. Lasix held last night  BP soft yesterday and all antihypertensives held Cr downtrended to 0.92 I/Os not recorded; wt not recorded  Inpatient Medications    Scheduled Meds:  atorvastatin  40 mg Oral Daily   enoxaparin (LOVENOX) injection  40 mg Subcutaneous Daily   furosemide  60 mg Intravenous BID   spironolactone  12.5 mg Oral Daily   Continuous Infusions:  PRN Meds:    Vital Signs    Vitals:   10/03/21 2334 10/04/21 0509 10/04/21 0817 10/04/21 0955  BP: (!) 85/64 90/71 100/78 96/78  Pulse: 79 75 80 81  Resp: 18 20 14 16   Temp: 98.3 F (36.8 C) 97.7 F (36.5 C) (!) 97.5 F (36.4 C) (!) 97.5 F (36.4 C)  TempSrc: Oral Oral Oral Oral  SpO2: 91% 98% 100% 98%  Weight:      Height:        Intake/Output Summary (Last 24 hours) at 10/04/2021 1011 Last data filed at 10/04/2021 0818 Gross per 24 hour  Intake 1310 ml  Output --  Net 1310 ml    Last 3 Weights 10/01/2021 09/05/2021 08/30/2021  Weight (lbs) 176 lb 12.9 oz 174 lb 6.4 oz 176 lb 12.8 oz  Weight (kg) 80.2 kg 79.107 kg 80.196 kg      Telemetry    NSR, frequent PVCs with runs of NSVT - Personally Reviewed  ECG    No new tracing - Personally Reviewed  Physical Exam   GEN: Comfortable, NAD Neck: JVD to mid-neck Cardiac: RRR, no murmurs, rubs, or gallops.  Respiratory: Crackles at the bases GI: Soft, nontender, non-distended  MS: 1+ pitting edema, warm Neuro:  Nonfocal  Psych: Normal affect   Labs    High Sensitivity Troponin:  No results for input(s): TROPONINIHS in the last 720 hours.   Chemistry Recent Labs  Lab 10/01/21 0119 10/01/21 1325 10/02/21 0439 10/04/21 0452  NA 140  --  137 136  K 4.8  --  4.4 3.7  CL 108  --  100 100  CO2 22  --   28 28  GLUCOSE 108*  --  85 109*  BUN 17  --  21* 23*  CREATININE 1.11 1.25* 1.27* 0.92  CALCIUM 9.2  --  8.2* 8.1*  PROT 7.1  --  5.7*  --   ALBUMIN 3.7  --  2.9*  --   AST 60*  --  52*  --   ALT 44  --  33  --   ALKPHOS 128*  --  107  --   BILITOT 2.5* 2.4* 1.7*  --   GFRNONAA >60 >60 >60 >60  ANIONGAP 10  --  9 8     Lipids No results for input(s): CHOL, TRIG, HDL, LABVLDL, LDLCALC, CHOLHDL in the last 168 hours.  Hematology Recent Labs  Lab 10/01/21 0119 10/01/21 1325 10/02/21 0439  WBC 9.6 8.6 6.7  RBC 6.47* 5.81 5.36  HGB 18.5* 16.7 15.1  HCT 56.9* 50.5 46.6  MCV 87.9 86.9 86.9  MCH 28.6 28.7 28.2  MCHC 32.5 33.1 32.4  RDW 16.6* 15.4 14.8  PLT 296 289 252    Thyroid No results for input(s): TSH, FREET4 in the last 168 hours.  BNP Recent Labs  Lab 10/01/21  0119  BNP 3,514.2*     DDimer No results for input(s): DDIMER in the last 168 hours.   Radiology    No results found.  Cardiac Studies   Echo 10/01/2021 1. Left ventricular ejection fraction, by estimation, is 20 to 25%. The  left ventricle has severely decreased function. The left ventricle  demonstrates global hypokinesis. The left ventricular internal cavity size  was mildly dilated. Left ventricular  diastolic parameters are consistent with Grade III diastolic dysfunction  (restrictive). Elevated left atrial pressure.   2. Right ventricular systolic function is mildly reduced. The right  ventricular size is moderately enlarged. There is severely elevated  pulmonary artery systolic pressure.   3. Left atrial size was severely dilated.   4. Right atrial size was severely dilated.   5. The mitral valve is normal in structure. Moderate to severe mitral  valve regurgitation.   6. Tricuspid valve regurgitation is severe.   7. The aortic valve is normal in structure. Aortic valve regurgitation is  not visualized. No aortic stenosis is present.   8. The inferior vena cava is dilated in size with >50%  respiratory  variability, suggesting right atrial pressure of 8 mmHg.   Comparison(s): No significant change from prior study. Prior images  reviewed side by side.     Patient Profile     61 y.o. male with PMH of chronic systolic CHF due to NICM thought to be secondary to alcohol abuse, HTN, NSVT, EtOh abuse, CKD stage III and nonobstructive CAD presented with CHF exacerbation in the setting of medication noncompliance and continued cocaine and alcohol use. Cardiology has been consulted for further management of HFrEF.  Assessment & Plan    #Acute on chronic systolic heart failure: #NICM Patient with known history of NICM thought to be primarily driven by alcohol abuse. Prior cath with nonobstructive CAD. TTE with stable EF 20-25%, global hypokinesis, G3DDD, mildly reduced RV systolic function, severe PHTN, severe biatrial enlargement, moderate-to-severe MR, severe TR, RAP 59mmHg. Has been noncompliant with medications and continues to abuse cocaine and alcohol leading to frequent readmissions. Presented on this admission with worsening SOB, LE edema in the setting of medication non-adherence consistent with acute on chronic HFrEF exacerbation. Now improving with IV diuresis. -Continues to be volume overloaded but given soft pressures, will decrease lasix to 40mg  IV BID -Will hold GDMT given hypotension to allow room for diuresis; will add as able -Continue alcohol and tobacco cessation counseling -Not ICD candidate currently due to noncompliance; can consider in the future if he maintains regular follow-up -Monitor I/Os and daily weights   #HTN: Holding due to soft pressures   #Frequent PVCs: -Holding BB due to soft blood pressures -Keep K>4, Mg>2 -Not ICD candidate currently due to noncompliance; can consider in future if follows up regularly and compliant with meds   #Nonobstructive CAD He denies chest pain.  -Continue lipitor 40mg  daily   #Hyperlipidemia with LDL goal less than  70 Noncompliant with statin. -Continue lipitor 40mg  daily   #CKD IIIA: -Monitor with diuresis     For questions or updates, please contact [place] Please consult [email or web address] for contact info under        Signed, Freada Bergeron, MD  10/04/2021, 10:11 AM

## 2021-10-05 LAB — BASIC METABOLIC PANEL
Anion gap: 9 (ref 5–15)
BUN: 17 mg/dL (ref 6–20)
CO2: 26 mmol/L (ref 22–32)
Calcium: 8.8 mg/dL — ABNORMAL LOW (ref 8.9–10.3)
Chloride: 101 mmol/L (ref 98–111)
Creatinine, Ser: 1.14 mg/dL (ref 0.61–1.24)
GFR, Estimated: >60 mL/min (ref >60)
Glucose, Bld: 184 mg/dL — ABNORMAL HIGH (ref 70–99)
Potassium: 3.9 mmol/L (ref 3.5–5.1)
Sodium: 136 mmol/L (ref 135–145)

## 2021-10-05 MED ORDER — HYDRALAZINE HCL 20 MG/ML IJ SOLN: 10.0000 mg | INTRAMUSCULAR | Status: DC | PRN

## 2021-10-05 MED ORDER — METOPROLOL TARTRATE 5 MG/5ML IV SOLN
5.0000 mg | INTRAVENOUS | Status: DC | PRN
  Administered 2021-10-07: 5 mg via INTRAVENOUS
  Filled 2021-10-05: qty 5

## 2021-10-05 MED ORDER — TRAZODONE HCL 50 MG PO TABS: 50.0000 mg | ORAL_TABLET | Freq: Every evening | ORAL | Status: DC | PRN

## 2021-10-05 MED ORDER — OXYCODONE HCL 5 MG PO TABS: 5.0000 mg | ORAL_TABLET | ORAL | Status: DC | PRN

## 2021-10-05 MED ORDER — SENNOSIDES-DOCUSATE SODIUM 8.6-50 MG PO TABS: 1.0000 | ORAL_TABLET | Freq: Every evening | ORAL | Status: DC | PRN

## 2021-10-05 MED ORDER — ACETAMINOPHEN 325 MG PO TABS
650.0000 mg | ORAL_TABLET | Freq: Four times a day (QID) | ORAL | Status: DC | PRN
  Administered 2021-10-06 – 2021-10-07 (×2): 650 mg via ORAL
  Filled 2021-10-05 (×2): qty 2

## 2021-10-05 MED ORDER — DM-GUAIFENESIN ER 30-600 MG PO TB12: 1.0000 | ORAL_TABLET | Freq: Two times a day (BID) | ORAL | Status: DC | PRN

## 2021-10-05 MED ORDER — IPRATROPIUM-ALBUTEROL 0.5-2.5 (3) MG/3ML IN SOLN: 3.0000 mL | RESPIRATORY_TRACT | Status: DC | PRN

## 2021-10-05 NOTE — Progress Notes (Signed)
Pt. Refused midnight VS.  Again, explained the importance of monitoring hypotension.  Will attempt again in 2 hours.

## 2021-10-05 NOTE — Progress Notes (Signed)
Patient refused all labs to be drawn this AM.

## 2021-10-05 NOTE — Progress Notes (Signed)
Progress Note  Patient Name: BARNEY GESSERT Date of Encounter: 10/05/2021  [facility] Cardiologist: Minus Breeding, MD   Subjective   Refused evening lasix as he did not want to urinate all night. States he still has mild LE edema and SOB. No chest pain. Amenable to stay today for medication optimization.  HR 80-100s BP improved Cr bumped to 1.26 Wt likely unreliable 175>148 (?)  Inpatient Medications    Scheduled Meds:  atorvastatin  40 mg Oral Daily   enoxaparin (LOVENOX) injection  40 mg Subcutaneous Daily   furosemide  40 mg Intravenous BID   Continuous Infusions:  PRN Meds:    Vital Signs    Vitals:   10/05/21 0620 10/05/21 1200 10/05/21 1344 10/05/21 1350  BP: 110/87   (!) 127/94  Pulse: 95   (!) 105  Resp: (!) 21 (!) 26 (!) 21   Temp: 98.9 F (37.2 C)     TempSrc: Oral     SpO2: 96%   100%  Weight:      Height:        Intake/Output Summary (Last 24 hours) at 10/05/2021 1956 Last data filed at 10/05/2021 1630 Gross per 24 hour  Intake 100 ml  Output 700 ml  Net -600 ml    Last 3 Weights 10/05/2021 10/01/2021 09/05/2021  Weight (lbs) 175 lb 14.8 oz 176 lb 12.9 oz 174 lb 6.4 oz  Weight (kg) 79.8 kg 80.2 kg 79.107 kg      Telemetry    NSR, frequent PVCs, runs of NSVT - Personally Reviewed  ECG    No new tracing - Personally Reviewed  Physical Exam   GEN: Comfortable, NAD Neck: JVD mildly elevated Cardiac: RRR, no murmurs, rubs, or gallops.  Respiratory: Scattered expiratory wheezing GI: Soft, nontender, non-distended  MS: trace-1+ pitting edema, warm Neuro:  Nonfocal  Psych: Normal affect   Labs    High Sensitivity Troponin:  No results for input(s): TROPONINIHS in the last 720 hours.   Chemistry Recent Labs  Lab 10/01/21 0119 10/01/21 1325 10/02/21 0439 10/04/21 0452 10/05/21 1003  NA 140  --  137 136 136  K 4.8  --  4.4 3.7 3.9  CL 108  --  100 100 101  CO2 22  --  28 28 26   GLUCOSE 108*  --  85 109* 184*  BUN 17  --  21*  23* 17  CREATININE 1.11 1.25* 1.27* 0.92 1.14  CALCIUM 9.2  --  8.2* 8.1* 8.8*  PROT 7.1  --  5.7*  --   --   ALBUMIN 3.7  --  2.9*  --   --   AST 60*  --  52*  --   --   ALT 44  --  33  --   --   ALKPHOS 128*  --  107  --   --   BILITOT 2.5* 2.4* 1.7*  --   --   GFRNONAA >60 >60 >60 >60 >60  ANIONGAP 10  --  9 8 9      Lipids No results for input(s): CHOL, TRIG, HDL, LABVLDL, LDLCALC, CHOLHDL in the last 168 hours.  Hematology Recent Labs  Lab 10/01/21 0119 10/01/21 1325 10/02/21 0439  WBC 9.6 8.6 6.7  RBC 6.47* 5.81 5.36  HGB 18.5* 16.7 15.1  HCT 56.9* 50.5 46.6  MCV 87.9 86.9 86.9  MCH 28.6 28.7 28.2  MCHC 32.5 33.1 32.4  RDW 16.6* 15.4 14.8  PLT 296 289 252  Thyroid No results for input(s): TSH, FREET4 in the last 168 hours.  BNP Recent Labs  Lab 10/01/21 0119  BNP 3,514.2*     DDimer No results for input(s): DDIMER in the last 168 hours.   Radiology    No results found.  Cardiac Studies   Echo 10/01/2021 1. Left ventricular ejection fraction, by estimation, is 20 to 25%. The  left ventricle has severely decreased function. The left ventricle  demonstrates global hypokinesis. The left ventricular internal cavity size  was mildly dilated. Left ventricular  diastolic parameters are consistent with Grade III diastolic dysfunction  (restrictive). Elevated left atrial pressure.   2. Right ventricular systolic function is mildly reduced. The right  ventricular size is moderately enlarged. There is severely elevated  pulmonary artery systolic pressure.   3. Left atrial size was severely dilated.   4. Right atrial size was severely dilated.   5. The mitral valve is normal in structure. Moderate to severe mitral  valve regurgitation.   6. Tricuspid valve regurgitation is severe.   7. The aortic valve is normal in structure. Aortic valve regurgitation is  not visualized. No aortic stenosis is present.   8. The inferior vena cava is dilated in size with >50%  respiratory  variability, suggesting right atrial pressure of 8 mmHg.   Comparison(s): No significant change from prior study. Prior images  reviewed side by side.     Patient Profile     61 y.o. male with PMH of chronic systolic CHF due to NICM thought to be secondary to alcohol abuse, HTN, NSVT, EtOh abuse, CKD stage III and nonobstructive CAD presented with CHF exacerbation in the setting of medication noncompliance and continued cocaine and alcohol use. Cardiology has been consulted for further management of HFrEF.  Assessment & Plan    #Acute on chronic systolic heart failure: #NICM Patient with known history of NICM thought to be primarily driven by alcohol abuse. Prior cath with nonobstructive CAD. TTE with stable EF 20-25%, global hypokinesis, G3DDD, mildly reduced RV systolic function, severe PHTN, severe biatrial enlargement, moderate-to-severe MR, severe TR, RAP 10mmHg. Has been noncompliant with medications and continues to abuse cocaine and alcohol leading to frequent readmissions. Presented on this admission with worsening SOB, LE edema in the setting of medication non-adherence consistent with acute on chronic HFrEF exacerbation. Now improving with IV diuresis. -Will give 1 dose of IV lasix 40mg  IV today and transition to lasix 40mg  PO daily tomorrow -Resume metop 12.5mg  XL daily as did not tolerate coreg due to hypotension (higher risk for beta selective BB due to cocaine use but was previously tolerating) -Resume spironolactone 12.5mg  daily  -Continue alcohol and tobacco cessation counseling -Not ICD candidate currently due to noncompliance; can consider in the future if he maintains regular follow-up -Monitor I/Os and daily weights -Will need close follow-up with [facility] HF clinic    #HTN: -Resume metop 12.5mg  XL daily as did not tolerate coreg due to hypotension (higher risk for beta selective BB due to cocaine use but was previously tolerating)   #Frequent PVCs: -Resume  metop 12.5mg  XL daily as did not tolerate coreg due to hypotension (higher risk for beta selective BB due to cocaine use but was previously tolerating) -Keep K>4, Mg>2 -Not ICD candidate currently due to noncompliance; can consider in future if follows up regularly and compliant with meds   #Nonobstructive CAD He denies chest pain.  -Continue lipitor 40mg  daily   #Hyperlipidemia with LDL goal less than 70 Noncompliant with statin. -  Continue lipitor 40mg  daily   #CKD IIIA: -Monitor with diuresis     For questions or updates, please contact [place] Please consult [email or web address] for contact info under        Signed, Freada Bergeron, MD  10/05/2021, 7:56 PM

## 2021-10-05 NOTE — Progress Notes (Signed)
PROGRESS NOTE    Daryl [name]  [id number] DOB: 1960/11/27 DOA: 09/30/2021 PCP: Patient, No Pcp Per (Inactive)   Brief Narrative:  61 years old male with PMH significant for HFr EF(LVEF 20 to 25%) hypertension, hyperlipidemia, cocaine abuse presented in the ED with worsening shortness of breath for 2 days.  He denies any chest pain, lightheadedness or palpitations.  He has noticed some leg swelling and weight gain.  Patient also reports vague abdominal pain.  He has not been taking his Lasix or blood pressure medication because he does not know which medications to take.  Patient also reports recent cocaine use. Patient is admitted for acute on chronic systolic CHF requiring IV diuresis.  Assessment and Plan: * Acute on chronic systolic CHF (congestive heart failure) ([place])- (present on admission) Chest x-ray shows right sided large pleural effusion, repeat echocardiogram shows EF 20-25%.  UDS positive for cocaine. Continue Lasix IV twice daily Cardiology following Ibesartan BB and Aldactone on hold.   Pleural effusion due to CHF (congestive heart failure) (HCC) -Stable compared to prior chest x-ray.  No evidence of overt shortness of breath.  We will hold off on thoracentesis.  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  Abdominal ultrasound shows cirrhosis with mild ascites.  Acute hepatitis panel is negative.  History of noncompliance with medical treatment Counseled in detail about medication compliance.  Cocaine abuse (HCC) Urine drug screen+ cocaine. Counseled against further cocaine use.  Stage 3a chronic kidney disease ([place])- (present on admission) Serum creatinine at baseline (1.1-1.27). Avoid nephrotoxic medications.  Essential hypertension- (present on admission) Continue irbesartan , spironolactone Metoprolol discontinued due to cocaine use. Continue Coreg 6.25 mg twice daily.  Hyperlipidemia - Lipitor        DVT prophylaxis:  enoxaparin (LOVENOX) injection 40 mg Start: 10/01/21 1300 Code Status: Full Family Communication:    Status is: Inpatient Remains inpatient appropriate because: Needs IV diuretics     Subjective: Feels little better, mild exertional dyspnea.  Refusing blood work and meds this morning.   Review of Systems Otherwise negative except as per HPI, including: General: Denies fever, chills, night sweats or unintended weight loss. Resp: Denies cough, wheezing, shortness of breath. Cardiac: Denies chest pain, palpitations, orthopnea, paroxysmal nocturnal dyspnea. GI: Denies abdominal pain, nausea, vomiting, diarrhea or constipation GU: Denies dysuria, frequency, hesitancy or incontinence MS: Denies muscle aches, joint pain or swelling Neuro: Denies headache, neurologic deficits (focal weakness, numbness, tingling), abnormal gait Psych: Denies anxiety, depression, SI/HI/AVH Skin: Denies new rashes or lesions ID: Denies sick contacts, exotic exposures, travel  Examination:  General exam: Appears calm and comfortable  Respiratory system: Clear to auscultation. Respiratory effort normal. Cardiovascular system: S1 & S2 heard, RRR. No JVD, murmurs, rubs, gallops or clicks. No pedal edema. Gastrointestinal system: Abdomen is nondistended, soft and nontender. No organomegaly or masses felt. Normal bowel sounds heard. Central nervous system: Alert and oriented. No focal neurological deficits. Extremities: Symmetric 5 x 5 power. Skin: No rashes, lesions or ulcers Psychiatry: Judgement and insight appear normal. Mood & affect appropriate.     Objective: Vitals:   10/05/21 0201 10/05/21 0423 10/05/21 0552 10/05/21 0620  BP: 110/84 115/81  110/87  Pulse: 93 99  95  Resp: (!) 24 20  (!) 21  Temp: 98.6 F (37 C) 98.4 F (36.9 C)  98.9 F (37.2 C)  TempSrc: Oral Oral  Oral  SpO2: 96% 95%  96%  Weight:   79.8 kg   Height:  Intake/Output Summary (Last 24 hours) at 10/05/2021  0833 Last data filed at 10/05/2021 0200 Gross per 24 hour  Intake 430 ml  Output --  Net 430 ml   Filed Weights   10/01/21 1843 10/05/21 0552  Weight: 80.2 kg 79.8 kg     Data Reviewed:   CBC: Recent Labs  Lab 10/01/21 0119 10/01/21 1325 10/02/21 0439  WBC 9.6 8.6 6.7  HGB 18.5* 16.7 15.1  HCT 56.9* 50.5 46.6  MCV 87.9 86.9 86.9  PLT 296 289 [id number]   Basic Metabolic Panel: Recent Labs  Lab 10/01/21 0119 10/01/21 1325 10/02/21 0439 10/04/21 0452  NA 140  --  137 136  K 4.8  --  4.4 3.7  CL 108  --  100 100  CO2 22  --  28 28  GLUCOSE 108*  --  85 109*  BUN 17  --  21* 23*  CREATININE 1.11 1.25* 1.27* 0.92  CALCIUM 9.2  --  8.2* 8.1*   GFR: Estimated Creatinine Clearance: 86.5 mL/min (by C-G formula based on SCr of 0.92 mg/dL). Liver Function Tests: Recent Labs  Lab 10/01/21 0119 10/01/21 1325 10/02/21 0439  AST 60*  --  52*  ALT 44  --  33  ALKPHOS 128*  --  107  BILITOT 2.5* 2.4* 1.7*  PROT 7.1  --  5.7*  ALBUMIN 3.7  --  2.9*   Recent Labs  Lab 10/01/21 0119  LIPASE 32   No results for input(s): AMMONIA in the last 168 hours. Coagulation Profile: No results for input(s): INR, PROTIME in the last 168 hours. Cardiac Enzymes: No results for input(s): CKTOTAL, CKMB, CKMBINDEX, TROPONINI in the last 168 hours. BNP (last 3 results) No results for input(s): PROBNP in the last 8760 hours. HbA1C: No results for input(s): HGBA1C in the last 72 hours. CBG: No results for input(s): GLUCAP in the last 168 hours. Lipid Profile: No results for input(s): CHOL, HDL, LDLCALC, TRIG, CHOLHDL, LDLDIRECT in the last 72 hours. Thyroid Function Tests: No results for input(s): TSH, T4TOTAL, FREET4, T3FREE, THYROIDAB in the last 72 hours. Anemia Panel: No results for input(s): VITAMINB12, FOLATE, FERRITIN, TIBC, IRON, RETICCTPCT in the last 72 hours. Sepsis Labs: No results for input(s): PROCALCITON, LATICACIDVEN in the last 168 hours.  Recent Results (from the  past 240 hour(s))  Resp Panel by RT-PCR (Flu A&B, Covid) Nasopharyngeal Swab     Status: None   Collection Time: 10/01/21  3:00 AM   Specimen: Nasopharyngeal Swab; Nasopharyngeal(NP) swabs in vial transport medium  Result Value Ref Range Status   SARS Coronavirus 2 by RT PCR NEGATIVE NEGATIVE Final    Comment: (NOTE) SARS-CoV-2 target nucleic acids are NOT DETECTED.  The SARS-CoV-2 RNA is generally detectable in upper respiratory specimens during the acute phase of infection. The lowest concentration of SARS-CoV-2 viral copies this assay can detect is 138 copies/mL. A negative result does not preclude SARS-Cov-2 infection and should not be used as the sole basis for treatment or other patient management decisions. A negative result may occur with  improper specimen collection/handling, submission of specimen other than nasopharyngeal swab, presence of viral mutation(s) within the areas targeted by this assay, and inadequate number of viral copies(<138 copies/mL). A negative result must be combined with clinical observations, patient history, and epidemiological information. The expected result is Negative.  Fact Sheet for Patients:  EntrepreneurPulse.com.au  Fact Sheet for Healthcare Providers:  IncredibleEmployment.be  This test is no t yet approved or cleared by the [place]  States FDA and  has been authorized for detection and/or diagnosis of SARS-CoV-2 by FDA under an Emergency Use Authorization (EUA). This EUA will remain  in effect (meaning this test can be used) for the duration of the COVID-19 declaration under Section 564(b)(1) of the Act, 21 U.S.C.section 360bbb-3(b)(1), unless the authorization is terminated  or revoked sooner.       Influenza A by PCR NEGATIVE NEGATIVE Final   Influenza B by PCR NEGATIVE NEGATIVE Final    Comment: (NOTE) The Xpert Xpress SARS-CoV-2/FLU/RSV plus assay is intended as an aid in the diagnosis of  influenza from Nasopharyngeal swab specimens and should not be used as a sole basis for treatment. Nasal washings and aspirates are unacceptable for Xpert Xpress SARS-CoV-2/FLU/RSV testing.  Fact Sheet for Patients: EntrepreneurPulse.com.au  Fact Sheet for Healthcare Providers: IncredibleEmployment.be  This test is not yet approved or cleared by the [place] FDA and has been authorized for detection and/or diagnosis of SARS-CoV-2 by FDA under an Emergency Use Authorization (EUA). This EUA will remain in effect (meaning this test can be used) for the duration of the COVID-19 declaration under Section 564(b)(1) of the Act, 21 U.S.C. section 360bbb-3(b)(1), unless the authorization is terminated or revoked.  Performed at [facility], [place] [address] [place], [place] [address]          Radiology Studies: No results found.      Scheduled Meds:  atorvastatin  40 mg Oral Daily   enoxaparin (LOVENOX) injection  40 mg Subcutaneous Daily   furosemide  40 mg Intravenous BID   Continuous Infusions:   LOS: 4 days   Time spent= 35 mins    Deshara Rossi Arsenio Loader, MD [facility]  If 7PM-7AM, please contact night-coverage  10/05/2021, 8:33 AM

## 2021-10-05 NOTE — Progress Notes (Signed)
Patient has refused meds and vital signs all morning up until now. Allowed BP and pulse ox to be gotten and would only take IV Lasix. States the meds "make me sick and I don't want to feel like that". Discussed the importance of the Lasix so he agreed. Appears slightly SOB. Has slight expiratory wheezing. Oxygen 100% on room air.Melton Alar, RN

## 2021-10-05 NOTE — Progress Notes (Signed)
Pt refusing dose of lasix. States it is too late and he does not want to be up all night using the restroom. Educated pt on importance of medication. On call notified. Pt resting in bed, call light within reach.

## 2021-10-06 LAB — BASIC METABOLIC PANEL
Anion gap: 8 (ref 5–15)
BUN: 20 mg/dL (ref 6–20)
CO2: 28 mmol/L (ref 22–32)
Calcium: 8.2 mg/dL — ABNORMAL LOW (ref 8.9–10.3)
Chloride: 98 mmol/L (ref 98–111)
Creatinine, Ser: 1.26 mg/dL — ABNORMAL HIGH (ref 0.61–1.24)
GFR, Estimated: >60 mL/min (ref >60)
Glucose, Bld: 146 mg/dL — ABNORMAL HIGH (ref 70–99)
Potassium: 3.5 mmol/L (ref 3.5–5.1)
Sodium: 134 mmol/L — ABNORMAL LOW (ref 135–145)

## 2021-10-06 LAB — MAGNESIUM: Magnesium: 1.8 mg/dL (ref 1.7–2.4)

## 2021-10-06 MED ORDER — POTASSIUM CHLORIDE CRYS ER 20 MEQ PO TBCR
40.0000 meq | EXTENDED_RELEASE_TABLET | Freq: Once | ORAL | Status: AC
  Administered 2021-10-06: 40 meq via ORAL
  Filled 2021-10-06: qty 2

## 2021-10-06 MED ORDER — METOPROLOL SUCCINATE ER 25 MG PO TB24
12.5000 mg | ORAL_TABLET | Freq: Every day | ORAL | Status: DC
  Administered 2021-10-06: 12.5 mg via ORAL
  Filled 2021-10-06 (×2): qty 1

## 2021-10-06 MED ORDER — FUROSEMIDE 10 MG/ML IJ SOLN
40.0000 mg | Freq: Once | INTRAMUSCULAR | Status: AC
  Administered 2021-10-06: 40 mg via INTRAVENOUS
  Filled 2021-10-06: qty 4

## 2021-10-06 MED ORDER — HYDROCORTISONE 1 % EX CREA
1.0000 "application " | TOPICAL_CREAM | Freq: Three times a day (TID) | CUTANEOUS | Status: DC | PRN
  Administered 2021-10-06: 1 via TOPICAL
  Filled 2021-10-06: qty 28

## 2021-10-06 MED ORDER — FUROSEMIDE 40 MG PO TABS
40.0000 mg | ORAL_TABLET | Freq: Every day | ORAL | Status: DC
  Filled 2021-10-06: qty 1

## 2021-10-06 MED ORDER — SPIRONOLACTONE 12.5 MG HALF TABLET
12.5000 mg | ORAL_TABLET | Freq: Every day | ORAL | Status: DC
  Filled 2021-10-06: qty 1

## 2021-10-06 NOTE — Progress Notes (Addendum)
PROGRESS NOTE    Daryl [name]  CWU:[id number] DOB: 03/11/1961 DOA: 09/30/2021 PCP: Patient, No Pcp Per (Inactive)   Brief Narrative:  61 years old male with PMH significant for HFr EF(LVEF 20 to 25%) hypertension, hyperlipidemia, cocaine abuse presented in the ED with worsening shortness of breath for 2 days.  He denies any chest pain, lightheadedness or palpitations.  He has noticed some leg swelling and weight gain.  Patient also reports vague abdominal pain.  He has not been taking his Lasix or blood pressure medication because he does not know which medications to take.  Patient also reports recent cocaine use. Patient is admitted for acute on chronic systolic CHF requiring IV diuresis.  Assessment and Plan: * Acute on chronic systolic CHF (congestive heart failure) (HCC)- (present on admission) Chest x-ray shows right sided large pleural effusion, repeat echocardiogram shows EF 20-25%.  UDS positive for cocaine. Plan for 1 more dose of IV Lasix today, transition to p.o. tomorrow. Cardiology following Ibesartan BB and Aldactone on hold.   Isolated Fever -Unknown etiology.  We will continue to monitor.  If spikes again will pursue further work-up and anti-infectives UA & CXR = negative.   Pleural effusion due to CHF (congestive heart failure) (HCC) -Stable compared to prior chest x-ray.  No evidence of overt shortness of breath.  We will hold off on thoracentesis.  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  Abdominal ultrasound shows cirrhosis with mild ascites.  Acute hepatitis panel is negative.  History of noncompliance with medical treatment Counseled in detail about medication compliance.  Cocaine abuse (HCC) Urine drug screen+ cocaine. Counseled against further cocaine use.  Stage 3a chronic kidney disease (HCC)- (present on admission) Serum creatinine at baseline (1.1-1.27). Avoid nephrotoxic medications.  Essential hypertension- (present on  admission) Continue irbesartan , spironolactone Metoprolol discontinued due to cocaine use. Continue Coreg 6.25 mg twice daily.  Hyperlipidemia - Lipitor   DVT prophylaxis: enoxaparin (LOVENOX) injection 40 mg Start: 10/01/21 1300 Code Status: Full Family Communication:    Status is: Inpatient Remains inpatient appropriate because: Needs IV diuretics     Subjective: Patient refused Lasix yesterday.  He is agreeable to getting 1 dose today. Fever overnight of 101F Does have some exertional shortness of breath but denies any at rest.  Denies any chest pain.  Review of Systems Otherwise negative except as per HPI, including: General: Denies fever, chills, night sweats or unintended weight loss. Resp: Denies cough, wheezing, shortness of breath. Cardiac: Denies chest pain, palpitations, orthopnea, paroxysmal nocturnal dyspnea. GI: Denies abdominal pain, nausea, vomiting, diarrhea or constipation GU: Denies dysuria, frequency, hesitancy or incontinence MS: Denies muscle aches, joint pain or swelling Neuro: Denies headache, neurologic deficits (focal weakness, numbness, tingling), abnormal gait Psych: Denies anxiety, depression, SI/HI/AVH Skin: Denies new rashes or lesions ID: Denies sick contacts, exotic exposures, travel  Examination:  Constitutional: Not in acute distress Respiratory: Clear to auscultation bilaterally Cardiovascular: Normal sinus rhythm, no rubs Abdomen: Nontender nondistended good bowel sounds Musculoskeletal: No edema noted Skin: No rashes seen Neurologic: CN 2-12 grossly intact.  And nonfocal Psychiatric: Normal judgment and insight. Alert and oriented x 3. Normal mood.    Objective: Vitals:   10/06/21 0329 10/06/21 0330 10/06/21 0419 10/06/21 0946  BP: 118/84   104/81  Pulse: (!) 107   73  Resp: (!) 22  20   Temp: (!) 101.3 F (38.5 C)  98.8 F (37.1 C)   TempSrc: Oral  Oral   SpO2: 93%  Weight:  67.3 kg    Height:         Intake/Output Summary (Last 24 hours) at 10/06/2021 1100 Last data filed at 10/06/2021 [id number] Gross per 24 hour  Intake 660 ml  Output 1225 ml  Net -565 ml   Filed Weights   10/01/21 1843 10/05/21 0552 10/06/21 0330  Weight: 80.2 kg 79.8 kg 67.3 kg     Data Reviewed:   CBC: Recent Labs  Lab 10/01/21 0119 10/01/21 1325 10/02/21 0439  WBC 9.6 8.6 6.7  HGB 18.5* 16.7 15.1  HCT 56.9* 50.5 46.6  MCV 87.9 86.9 86.9  PLT 296 289 [id number]   Basic Metabolic Panel: Recent Labs  Lab 10/01/21 0119 10/01/21 1325 10/02/21 0439 10/04/21 0452 10/05/21 1003 10/06/21 0450  NA 140  --  137 136 136 134*  K 4.8  --  4.4 3.7 3.9 3.5  CL 108  --  100 100 101 98  CO2 22  --  28 28 26 28   GLUCOSE 108*  --  85 109* 184* 146*  BUN 17  --  21* 23* 17 20  CREATININE 1.11 1.25* 1.27* 0.92 1.14 1.26*  CALCIUM 9.2  --  8.2* 8.1* 8.8* 8.2*  MG  --   --   --   --   --  1.8   GFR: Estimated Creatinine Clearance: 60.1 mL/min (A) (by C-G formula based on SCr of 1.26 mg/dL (H)). Liver Function Tests: Recent Labs  Lab 10/01/21 0119 10/01/21 1325 10/02/21 0439  AST 60*  --  52*  ALT 44  --  33  ALKPHOS 128*  --  107  BILITOT 2.5* 2.4* 1.7*  PROT 7.1  --  5.7*  ALBUMIN 3.7  --  2.9*   Recent Labs  Lab 10/01/21 0119  LIPASE 32   No results for input(s): AMMONIA in the last 168 hours. Coagulation Profile: No results for input(s): INR, PROTIME in the last 168 hours. Cardiac Enzymes: No results for input(s): CKTOTAL, CKMB, CKMBINDEX, TROPONINI in the last 168 hours. BNP (last 3 results) No results for input(s): PROBNP in the last 8760 hours. HbA1C: No results for input(s): HGBA1C in the last 72 hours. CBG: No results for input(s): GLUCAP in the last 168 hours. Lipid Profile: No results for input(s): CHOL, HDL, LDLCALC, TRIG, CHOLHDL, LDLDIRECT in the last 72 hours. Thyroid Function Tests: No results for input(s): TSH, T4TOTAL, FREET4, T3FREE, THYROIDAB in the last 72 hours. Anemia  Panel: No results for input(s): VITAMINB12, FOLATE, FERRITIN, TIBC, IRON, RETICCTPCT in the last 72 hours. Sepsis Labs: No results for input(s): PROCALCITON, LATICACIDVEN in the last 168 hours.  Recent Results (from the past 240 hour(s))  Resp Panel by RT-PCR (Flu A&B, Covid) Nasopharyngeal Swab     Status: None   Collection Time: 10/01/21  3:00 AM   Specimen: Nasopharyngeal Swab; Nasopharyngeal(NP) swabs in vial transport medium  Result Value Ref Range Status   SARS Coronavirus 2 by RT PCR NEGATIVE NEGATIVE Final    Comment: (NOTE) SARS-CoV-2 target nucleic acids are NOT DETECTED.  The SARS-CoV-2 RNA is generally detectable in upper respiratory specimens during the acute phase of infection. The lowest concentration of SARS-CoV-2 viral copies this assay can detect is 138 copies/mL. A negative result does not preclude SARS-Cov-2 infection and should not be used as the sole basis for treatment or other patient management decisions. A negative result may occur with  improper specimen collection/handling, submission of specimen other than nasopharyngeal swab, presence of viral mutation(s) within  the areas targeted by this assay, and inadequate number of viral copies(<138 copies/mL). A negative result must be combined with clinical observations, patient history, and epidemiological information. The expected result is Negative.  Fact Sheet for Patients:  EntrepreneurPulse.com.au  Fact Sheet for Healthcare Providers:  IncredibleEmployment.be  This test is no t yet approved or cleared by the [place] FDA and  has been authorized for detection and/or diagnosis of SARS-CoV-2 by FDA under an Emergency Use Authorization (EUA). This EUA will remain  in effect (meaning this test can be used) for the duration of the COVID-19 declaration under Section 564(b)(1) of the Act, 21 U.S.C.section 360bbb-3(b)(1), unless the authorization is terminated  or  revoked sooner.       Influenza A by PCR NEGATIVE NEGATIVE Final   Influenza B by PCR NEGATIVE NEGATIVE Final    Comment: (NOTE) The Xpert Xpress SARS-CoV-2/FLU/RSV plus assay is intended as an aid in the diagnosis of influenza from Nasopharyngeal swab specimens and should not be used as a sole basis for treatment. Nasal washings and aspirates are unacceptable for Xpert Xpress SARS-CoV-2/FLU/RSV testing.  Fact Sheet for Patients: EntrepreneurPulse.com.au  Fact Sheet for Healthcare Providers: IncredibleEmployment.be  This test is not yet approved or cleared by the [place] FDA and has been authorized for detection and/or diagnosis of SARS-CoV-2 by FDA under an Emergency Use Authorization (EUA). This EUA will remain in effect (meaning this test can be used) for the duration of the COVID-19 declaration under Section 564(b)(1) of the Act, 21 U.S.C. section 360bbb-3(b)(1), unless the authorization is terminated or revoked.  Performed at [facility], [employer] [address], [place], [place] [address]      Radiology Studies: No results found.      Scheduled Meds:  atorvastatin  40 mg Oral Daily   enoxaparin (LOVENOX) injection  40 mg Subcutaneous Daily   [START ON 10/07/2021] furosemide  40 mg Oral Daily   metoprolol succinate  12.5 mg Oral Daily   [START ON 10/07/2021] spironolactone  12.5 mg Oral Daily   Continuous Infusions:   LOS: 5 days   Time spent= 35 mins    Zarielle Cea Arsenio Loader, MD [facility]  If 7PM-7AM, please contact night-coverage  10/06/2021, 11:00 AM

## 2021-10-06 NOTE — Progress Notes (Signed)
Patient refused ordered IV lasix, Dr. Reesa Chew and cardiology notified, Dr. Reesa Chew talk to patient and he later agreed to take med.

## 2021-10-07 ENCOUNTER — Other Ambulatory Visit: Payer: Self-pay

## 2021-10-07 MED ORDER — FUROSEMIDE 40 MG PO TABS
40.0000 mg | ORAL_TABLET | Freq: Every day | ORAL | 0 refills | Status: DC
  Filled 2021-10-07: qty 30, 30d supply, fill #0

## 2021-10-07 MED ORDER — MAGNESIUM OXIDE -MG SUPPLEMENT 400 (240 MG) MG PO TABS: 800.0000 mg | ORAL_TABLET | Freq: Once | ORAL | Status: DC

## 2021-10-07 MED ORDER — POTASSIUM CHLORIDE CRYS ER 20 MEQ PO TBCR: 40.0000 meq | EXTENDED_RELEASE_TABLET | Freq: Once | ORAL | Status: DC

## 2021-10-07 MED ORDER — METOPROLOL SUCCINATE ER 25 MG PO TB24
12.5000 mg | ORAL_TABLET | Freq: Every day | ORAL | 0 refills | Status: DC
  Filled 2021-10-07: qty 15, 30d supply, fill #0
  Filled 2021-11-20: qty 30, 60d supply, fill #0

## 2021-10-07 NOTE — Progress Notes (Signed)
Patient still did not leaving after signing AMA paper. Calling asking for shaving cream and razor, and something to eat and drink.

## 2021-10-07 NOTE — Progress Notes (Signed)
Pt had 8 beat run of VT captured in the chart. On-call notified. No new orders given. Pt resting in bed with no complaints.

## 2021-10-07 NOTE — Progress Notes (Signed)
Patient discharged. Discharge instructions given and explained to patient, he verbalized understanding. Asked patient about transportation or if there is anything  he is waiting on, he stated he has transportation and wants to leave whenever he is ready, after watching the movie.

## 2021-10-07 NOTE — Progress Notes (Signed)
Progress Note  Patient Name: Daryl [name] Date of Encounter: 10/07/2021  Primary Cardiologist:   Minus Breeding, MD   Subjective   He denies chest pain or SOB.  He is wanting to leave.    Inpatient Medications    Scheduled Meds:  atorvastatin  40 mg Oral Daily   enoxaparin (LOVENOX) injection  40 mg Subcutaneous Daily   furosemide  40 mg Oral Daily   magnesium oxide  800 mg Oral Once   metoprolol succinate  12.5 mg Oral Daily   potassium chloride  40 mEq Oral Once   spironolactone  12.5 mg Oral Daily   Continuous Infusions:  PRN Meds: acetaminophen, dextromethorphan-guaiFENesin, hydrALAZINE, hydrocortisone cream, ipratropium-albuterol, metoprolol tartrate, oxyCODONE, senna-docusate, traZODone   Vital Signs    Vitals:   10/06/21 0946 10/06/21 2000 10/07/21 0402 10/07/21 0410  BP: 104/81 104/68 100/76   Pulse: 73 99 95   Resp:   20   Temp:  (!) 100.4 F (38 C) 98.7 F (37.1 C)   TempSrc:  Oral Oral   SpO2:  93% 96%   Weight:    65.2 kg  Height:        Intake/Output Summary (Last 24 hours) at 10/07/2021 1022 Last data filed at 10/07/2021 0055 Gross per 24 hour  Intake 600 ml  Output 1000 ml  Net -400 ml   Filed Weights   10/05/21 0552 10/06/21 0330 10/07/21 0410  Weight: 79.8 kg 67.3 kg 65.2 kg    Telemetry    NSR, PVCs, took off the monitor - Personally Reviewed  ECG    NA - Personally Reviewed  Physical Exam   GEN: No acute distress.   Neck:    Positive to jaw at 45 degrees JVD Cardiac: RRR, systolic murmur, no diastolic murmurs, rubs, or gallops.  Respiratory:        Decreased breath sounds with diffuse rhonchi and scattered wheezes.  GI: Soft, nontender, non-distended  MS: No  edema; No deformity. Neuro:  Nonfocal  Psych: Normal affect   Labs    Chemistry Recent Labs  Lab 10/01/21 0119 10/01/21 1325 10/02/21 0439 10/04/21 0452 10/05/21 1003 10/06/21 0450  NA 140  --  137 136 136 134*  K 4.8  --  4.4 3.7 3.9 3.5  CL 108  --  100  100 101 98  CO2 22  --  28 28 26 28   GLUCOSE 108*  --  85 109* 184* 146*  BUN 17  --  21* 23* 17 20  CREATININE 1.11 1.25* 1.27* 0.92 1.14 1.26*  CALCIUM 9.2  --  8.2* 8.1* 8.8* 8.2*  PROT 7.1  --  5.7*  --   --   --   ALBUMIN 3.7  --  2.9*  --   --   --   AST 60*  --  52*  --   --   --   ALT 44  --  33  --   --   --   ALKPHOS 128*  --  107  --   --   --   BILITOT 2.5* 2.4* 1.7*  --   --   --   GFRNONAA >60 >60 >60 >60 >60 >60  ANIONGAP 10  --  9 8 9 8      Hematology Recent Labs  Lab 10/01/21 0119 10/01/21 1325 10/02/21 0439  WBC 9.6 8.6 6.7  RBC 6.47* 5.81 5.36  HGB 18.5* 16.7 15.1  HCT 56.9* 50.5 46.6  MCV 87.9 86.9  86.9  MCH 28.6 28.7 28.2  MCHC 32.5 33.1 32.4  RDW 16.6* 15.4 14.8  PLT 296 289 252    Cardiac EnzymesNo results for input(s): TROPONINI in the last 168 hours. No results for input(s): TROPIPOC in the last 168 hours.   BNP Recent Labs  Lab 10/01/21 0119  BNP 3,514.2*     DDimer No results for input(s): DDIMER in the last 168 hours.   Radiology    No results found.  Cardiac Studies   ECHO:  1. Left ventricular ejection fraction, by estimation, is 20 to 25%. The  left ventricle has severely decreased function. The left ventricle  demonstrates global hypokinesis. The left ventricular internal cavity size  was mildly dilated. Left ventricular  diastolic parameters are consistent with Grade III diastolic dysfunction  (restrictive). Elevated left atrial pressure.   2. Right ventricular systolic function is mildly reduced. The right  ventricular size is moderately enlarged. There is severely elevated  pulmonary artery systolic pressure.   3. Left atrial size was severely dilated.   4. Right atrial size was severely dilated.   5. The mitral valve is normal in structure. Moderate to severe mitral  valve regurgitation.   6. Tricuspid valve regurgitation is severe.   7. The aortic valve is normal in structure. Aortic valve regurgitation is  not  visualized. No aortic stenosis is present.   8. The inferior vena cava is dilated in size with >50% respiratory  variability, suggesting right atrial pressure of 8 mmHg.    Patient Profile     61 y.o. male with PMH of chronic systolic CHF due to NICM thought to be secondary to alcohol abuse, HTN, NSVT, EtOh abuse, CKD stage III and nonobstructive CAD presented with CHF exacerbation in the setting of medication noncompliance and continued cocaine and alcohol use. Cardiology has been consulted for further management of HFrEF.  Assessment & Plan    Acute on chronic systolic HF:  The patient is well known to me from his intermittent follow up with me in the office.  Prognosis has always been very poor because of his limited understanding, marginal social support and adherence.  He has a son who encourages him to come to appts at times.  Refused Lasix yesterday.  I agree with the meds as listed.  Lasix 40 po, Toprol XL 12.5, spironolactone 12.5, Lipitor 40.   I would likely continue KCL 20 meq daily as well.     We will schedule follow up.    For questions or updates, please contact [place] Please consult [email or web address] for contact info under Cardiology/STEMI.   Signed, Minus Breeding, MD  10/07/2021, 10:22 AM

## 2021-10-07 NOTE — Progress Notes (Signed)
Patient took his heart monitor off, refused all her medication, stated she is ready to go. Encouraged him to wait for the MD to see him. Dr. Nelson Chimes notified and stated he will be on his way to see patient as soon as he can, patient informed.

## 2021-10-07 NOTE — Progress Notes (Signed)
Asked patient again that the staff will get the wheelchair to take him down, he stated why is he being rushed, that he wants to go to vending machine. Charge nurse notified.

## 2021-10-07 NOTE — Progress Notes (Addendum)
RN reinforced the education about fluid restriction day shift nurse began with patient. Patient still requesting liquids frequently.  Pt refused lab work to be drawn. RN educated patient as to reason for lab work.

## 2021-10-07 NOTE — Discharge Summary (Signed)
Physician Discharge Summary  Daryl [name] GMW:[id number] DOB: Jun 15, 1961 DOA: 09/30/2021  PCP: Patient, No Pcp Per (Inactive)  Admit date: 09/30/2021 Discharge date: 10/07/2021  Admitted From: Home Disposition: Home  Recommendations for Outpatient Follow-up:  Follow up with PCP in 1-2 weeks Please obtain BMP/CBC in one week your next doctors visit.  Follow-up outpatient cardiology to be arranged by their service Home cardiac meds-Lasix 40 mg daily, Toprol-XL 12.5 mg daily, Aldactone 12.5 mg daily, Lipitor 40 mg daily and daily potassium chloride 20 mEq.   Discharge Condition: Stable CODE STATUS: Full code Diet recommendation: Heart healthy low-salt  Brief/Interim Summary: 61 years old male with PMH significant for HFr EF(LVEF 20 to 25%) hypertension, hyperlipidemia, cocaine abuse presented in the ED with worsening shortness of breath for 2 days.  He denies any chest pain, lightheadedness or palpitations.  He has noticed some leg swelling and weight gain.  Patient also reports vague abdominal pain.  He has not been taking his Lasix or blood pressure medication because he does not know which medications to take.  Patient also reports recent cocaine use. Patient is admitted for acute on chronic systolic CHF requiring IV diuresis.  Over the course of several days cardiac medications were adjusted by cardiology team.  Echocardiogram here showed EF of 25%.  He was counseled to quit using any illicit drugs especially cocaine.   Assessment and Plan: * Acute on chronic systolic CHF (congestive heart failure) (HCC)- (present on admission) Chest x-ray shows right sided large pleural effusion, repeat echocardiogram shows EF 20-25%.  UDS positive for cocaine.  Transition to oral Lasix today.  Cardiac medications as mentioned above, follow-up outpatient.   Isolated Fever - He does understand he had low-grade fever yesterday and does not wish this to be evaluated and wants to follow-up outpatient.   Follow-up outpatient with PCP.   Pleural effusion due to CHF (congestive heart failure) (HCC) -Stable compared to prior chest x-ray.  No evidence of overt shortness of breath.     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  Abdominal ultrasound shows cirrhosis with mild ascites.  Acute hepatitis panel is negative.   History of noncompliance with medical treatment Counseled in detail about medication compliance.   Cocaine abuse (HCC) Urine drug screen+ cocaine. Counseled against further cocaine use.   Stage 3a chronic kidney disease (HCC)- (present on admission) Serum creatinine at baseline (1.1-1.27). Avoid nephrotoxic medications.   Essential hypertension- (present on admission) Medications as mentioned above.  Counseled to quit using cocaine   Hyperlipidemia - Lipitor    Body mass index is 21.24 kg/m.         Discharge Diagnoses:  Principal Problem:   Acute on chronic systolic CHF (congestive heart failure) (HCC) Active Problems:   Essential hypertension   Stage 3a chronic kidney disease (HCC)   Cocaine abuse (HCC)   History of noncompliance with medical treatment   Elevated LFTs   Pleural effusion due to CHF (congestive heart failure) [facility])      Consultations: Cardiology  Subjective: Feeling okay no complaints.  Patient is demanding to leave the hospital and will follow-up outpatient. He does understand he had a low-grade fever yesterday evening but does not want this to be evaluated and will follow-up outpatient.  Discharge Exam: Vitals:   10/06/21 2000 10/07/21 0402  BP: 104/68 100/76  Pulse: 99 95  Resp:  20  Temp: (!) 100.4 F (38 C) 98.7 F (37.1 C)  SpO2: 93% 96%   Vitals:  10/06/21 0946 10/06/21 2000 10/07/21 0402 10/07/21 0410  BP: 104/81 104/68 100/76   Pulse: 73 99 95   Resp:   20   Temp:  (!) 100.4 F (38 C) 98.7 F (37.1 C)   TempSrc:  Oral Oral   SpO2:  93% 96%   Weight:    65.2 kg  Height:         General: Pt is alert, awake, not in acute distress Cardiovascular: RRR, S1/S2 +, no rubs, no gallops Respiratory: CTA bilaterally, no wheezing, no rhonchi Abdominal: Soft, NT, ND, bowel sounds + Extremities: no edema, no cyanosis  Discharge Instructions   Allergies as of 10/07/2021       Reactions   Penicillins Swelling   Did it involve swelling of the face/tongue/throat, SOB, or low BP? Yes Did it involve sudden or severe rash/hives, skin peeling, or any reaction on the inside of your mouth or nose? No Did you need to seek medical attention at a hospital or doctor's office? Yes When did it last happen?    young adult   If all above answers are "NO", may proceed with cephalosporin use.        Medication List     STOP taking these medications    irbesartan 75 MG tablet Commonly known as: AVAPRO       TAKE these medications    atorvastatin 40 MG tablet Commonly known as: LIPITOR Take 1 tablet (40 mg total) by mouth daily.   furosemide 40 MG tablet Commonly known as: LASIX Take 1 tablet (40 mg total) by mouth daily. Start taking on: October 08, 2021 What changed:  medication strength how much to take   metoprolol succinate 25 MG 24 hr tablet Commonly known as: TOPROL-XL Take 0.5 tablets (12.5 mg total) by mouth daily. Start taking on: October 08, 2021 What changed:  medication strength how much to take additional instructions   potassium chloride SA 20 MEQ tablet Commonly known as: KLOR-CON M Take 40 mEq by mouth daily.   spironolactone 25 MG tablet Commonly known as: ALDACTONE Take 0.5 tablets (12.5 mg total) by mouth daily.        Follow-up Information     [facility] Follow up.   Specialty: Cardiology Why: they should call you with date and time. Contact information: [id number] [place]        Minus Breeding, MD Follow up.    Specialty: Cardiology Why: office will call with date and time,  if you have not heard in 4 days please call the office Contact information: [address] [place] [address] [phone]         Minus Breeding, MD Follow up in 1 week(s).   Specialty: Cardiology Contact information: [address] [place] [address] [phone]                Allergies  Allergen Reactions   Penicillins Swelling    Did it involve swelling of the face/tongue/throat, SOB, or low BP? Yes Did it involve sudden or severe rash/hives, skin peeling, or any reaction on the inside of your mouth or nose? No Did you need to seek medical attention at a hospital or doctor's office? Yes When did it last happen?    young adult   If all above answers are "NO", may proceed with cephalosporin use.     You were cared for by a hospitalist during  your hospital stay. If you have any questions about your discharge medications or the care you received while you were in the hospital after you are discharged, you can call the unit and asked to speak with the hospitalist on call if the hospitalist that took care of you is not available. Once you are discharged, your primary care physician will handle any further medical issues. Please note that no refills for any discharge medications will be authorized once you are discharged, as it is imperative that you return to your primary care physician (or establish a relationship with a primary care physician if you do not have one) for your aftercare needs so that they can reassess your need for medications and monitor your lab values.   Procedures/Studies: DG Chest Portable 1 View  Result Date: 10/01/2021 CLINICAL DATA:  08/15/2021 EXAM: PORTABLE CHEST 1 VIEW COMPARISON:  Chest x-ray 08/15/2021.  Chest CT 09/13/2018 FINDINGS: Large right pleural effusion with right base atelectasis. Left lung clear. Cardiomegaly. No acute bony abnormality.  IMPRESSION: Large right pleural effusion with right base atelectasis, similar to prior study. Electronically Signed   By: Rolm Baptise M.D.   On: 10/01/2021 00:11   ECHOCARDIOGRAM COMPLETE  Result Date: 10/01/2021    ECHOCARDIOGRAM REPORT   Patient Name:   KINSLER SHRIDER Date of Exam: 10/01/2021 Medical Rec #:  [id number]    Height:       68.0 in Accession #:    [id number]   Weight:       174.4 lb Date of Birth:  Jun 09, 1961    BSA:          1.928 m Patient Age:    63 years     BP:           136/103 mmHg Patient Gender: M            HR:           95 bpm. Exam Location:  Inpatient Procedure: 2D Echo Indications:    CHF  History:        Patient has prior history of Echocardiogram examinations, most                 recent 06/23/2021. CHF; Risk Factors:Hypertension.  Sonographer:    Jefferey Pica Referring Phys: [id number] [place]  1. Left ventricular ejection fraction, by estimation, is 20 to 25%. The left ventricle has severely decreased function. The left ventricle demonstrates global hypokinesis. The left ventricular internal cavity size was mildly dilated. Left ventricular diastolic parameters are consistent with Grade III diastolic dysfunction (restrictive). Elevated left atrial pressure.  2. Right ventricular systolic function is mildly reduced. The right ventricular size is moderately enlarged. There is severely elevated pulmonary artery systolic pressure.  3. Left atrial size was severely dilated.  4. Right atrial size was severely dilated.  5. The mitral valve is normal in structure. Moderate to severe mitral valve regurgitation.  6. Tricuspid valve regurgitation is severe.  7. The aortic valve is normal in structure. Aortic valve regurgitation is not visualized. No aortic stenosis is present.  8. The inferior vena cava is dilated in size with >50% respiratory variability, suggesting right atrial pressure of 8 mmHg. Comparison(s): No significant change from prior study. Prior images reviewed  side by side. FINDINGS  Left Ventricle: No left ventricular thrombus is seen. Left ventricular ejection fraction, by estimation, is 20 to 25%. The left ventricle has severely decreased function. The left ventricle demonstrates global hypokinesis. The left ventricular  internal cavity size was mildly dilated. There is no left ventricular hypertrophy. Left ventricular diastolic parameters are consistent with Grade III diastolic dysfunction (restrictive). Elevated left atrial pressure. Right Ventricle: The right ventricular size is moderately enlarged. No increase in right ventricular wall thickness. Right ventricular systolic function is mildly reduced. There is severely elevated pulmonary artery systolic pressure. The tricuspid regurgitant velocity is 4.34 m/s, and with an assumed right atrial pressure of 8 mmHg, the estimated right ventricular systolic pressure is 123456 mmHg. Left Atrium: Left atrial size was severely dilated. Right Atrium: Right atrial size was severely dilated. Pericardium: There is no evidence of pericardial effusion. Mitral Valve: The mitral valve is normal in structure. Moderate to severe mitral valve regurgitation, with posteriorly-directed jet. Tricuspid Valve: The tricuspid valve is normal in structure. Tricuspid valve regurgitation is severe. Aortic Valve: The aortic valve is normal in structure. Aortic valve regurgitation is not visualized. No aortic stenosis is present. Aortic valve peak gradient measures 3.1 mmHg. Pulmonic Valve: The pulmonic valve was normal in structure. Pulmonic valve regurgitation is not visualized. Aorta: The aortic root and ascending aorta are structurally normal, with no evidence of dilitation. Venous: A systolic blunting flow pattern is recorded from the right upper pulmonary vein. The inferior vena cava is dilated in size with greater than 50% respiratory variability, suggesting right atrial pressure of 8 mmHg. The inferior vena cava and the hepatic vein show a  pattern of systolic flow reversal, suggestive of tricuspid regurgitation. IAS/Shunts: There is right bowing of the interatrial septum, suggestive of elevated left atrial pressure. No atrial level shunt detected by color flow Doppler.  LEFT VENTRICLE PLAX 2D LVIDd:         6.10 cm      Diastology LVIDs:         5.60 cm      LV e' medial:    6.36 cm/s LV PW:         1.10 cm      LV E/e' medial:  13.1 LV IVS:        0.90 cm      LV e' lateral:   6.86 cm/s LVOT diam:     1.90 cm      LV E/e' lateral: 12.1 LV SV:         28 LV SV Index:   15 LVOT Area:     2.84 cm  LV Volumes (MOD) LV vol d, MOD A4C: 159.0 ml LV vol s, MOD A4C: 112.0 ml LV SV MOD A4C:     159.0 ml RIGHT VENTRICLE             IVC RV Basal diam:  3.50 cm     IVC diam: 2.20 cm RV S prime:     11.80 cm/s TAPSE (M-mode): 1.5 cm LEFT ATRIUM              Index        RIGHT ATRIUM           Index LA diam:        5.40 cm  2.80 cm/m   RA Area:     32.40 cm LA Vol (A2C):   126.0 ml 65.35 ml/m  RA Volume:   122.00 ml 63.27 ml/m LA Vol (A4C):   102.0 ml 52.90 ml/m LA Biplane Vol: 117.0 ml 60.68 ml/m  AORTIC VALVE                 PULMONIC VALVE AV Area (Vmax): 2.32 cm  PV Vmax:       0.55 m/s AV Vmax:        87.80 cm/s   PV Peak grad:  1.2 mmHg AV Peak Grad:   3.1 mmHg LVOT Vmax:      71.70 cm/s LVOT Vmean:     40.500 cm/s LVOT VTI:       0.100 m  AORTA Ao Root diam: 2.90 cm Ao Asc diam:  2.80 cm MITRAL VALVE               TRICUSPID VALVE MV Area (PHT): 7.51 cm    TR Peak grad:   75.3 mmHg MV Decel Time: 101 msec    TR Vmax:        434.00 cm/s MR Peak grad: 97.2 mmHg MR Vmax:      493.00 cm/s  SHUNTS MV E velocity: 83.00 cm/s  Systemic VTI:  0.10 m MV A velocity: 45.90 cm/s  Systemic Diam: 1.90 cm MV E/A ratio:  1.81 Mihai Croitoru MD Electronically signed by Sanda Klein MD Signature Date/Time: 10/01/2021/1:43:13 PM    Final    US Abdomen Limited RUQ (LIVER/GB)  Result Date: 10/01/2021 CLINICAL DATA:  Elevated LFTs. EXAM: ULTRASOUND ABDOMEN LIMITED  RIGHT UPPER QUADRANT COMPARISON:  Abdominal ultrasound dated September 13, 2018. FINDINGS: Gallbladder: No gallstones or wall thickening visualized. No sonographic Murphy sign noted by sonographer. Common bile duct: Diameter: 2 mm, normal. Liver: No focal lesion identified. Nodular liver contour. Similar diffusely increased parenchymal echogenicity. Portal vein is patent on color Doppler imaging with normal direction of blood flow towards the liver. Other: Increased small perihepatic ascites. Increased echogenicity of the right kidney. IMPRESSION: 1. No acute abnormality. 2. Cirrhosis with small perihepatic ascites. 3. Increased right renal echogenicity, consistent with medical renal disease. Electronically Signed   By: Titus Dubin M.D.   On: 10/01/2021 10:47     The results of significant diagnostics from this hospitalization (including imaging, microbiology, ancillary and laboratory) are listed below for reference.     Microbiology: Recent Results (from the past 240 hour(s))  Resp Panel by RT-PCR (Flu A&B, Covid) Nasopharyngeal Swab     Status: None   Collection Time: 10/01/21  3:00 AM   Specimen: Nasopharyngeal Swab; Nasopharyngeal(NP) swabs in vial transport medium  Result Value Ref Range Status   SARS Coronavirus 2 by RT PCR NEGATIVE NEGATIVE Final    Comment: (NOTE) SARS-CoV-2 target nucleic acids are NOT DETECTED.  The SARS-CoV-2 RNA is generally detectable in upper respiratory specimens during the acute phase of infection. The lowest concentration of SARS-CoV-2 viral copies this assay can detect is 138 copies/mL. A negative result does not preclude SARS-Cov-2 infection and should not be used as the sole basis for treatment or other patient management decisions. A negative result may occur with  improper specimen collection/handling, submission of specimen other than nasopharyngeal swab, presence of viral mutation(s) within the areas targeted by this assay, and inadequate number of  viral copies(<138 copies/mL). A negative result must be combined with clinical observations, patient history, and epidemiological information. The expected result is Negative.  Fact Sheet for Patients:  EntrepreneurPulse.com.au  Fact Sheet for Healthcare Providers:  IncredibleEmployment.be  This test is no t yet approved or cleared by the [place] FDA and  has been authorized for detection and/or diagnosis of SARS-CoV-2 by FDA under an Emergency Use Authorization (EUA). This EUA will remain  in effect (meaning this test can be used) for the duration of the COVID-19 declaration under Section 564(b)(1) of  the Act, 21 U.S.C.section 360bbb-3(b)(1), unless the authorization is terminated  or revoked sooner.       Influenza A by PCR NEGATIVE NEGATIVE Final   Influenza B by PCR NEGATIVE NEGATIVE Final    Comment: (NOTE) The Xpert Xpress SARS-CoV-2/FLU/RSV plus assay is intended as an aid in the diagnosis of influenza from Nasopharyngeal swab specimens and should not be used as a sole basis for treatment. Nasal washings and aspirates are unacceptable for Xpert Xpress SARS-CoV-2/FLU/RSV testing.  Fact Sheet for Patients: EntrepreneurPulse.com.au  Fact Sheet for Healthcare Providers: IncredibleEmployment.be  This test is not yet approved or cleared by the [place] FDA and has been authorized for detection and/or diagnosis of SARS-CoV-2 by FDA under an Emergency Use Authorization (EUA). This EUA will remain in effect (meaning this test can be used) for the duration of the COVID-19 declaration under Section 564(b)(1) of the Act, 21 U.S.C. section 360bbb-3(b)(1), unless the authorization is terminated or revoked.  Performed at [facility], [place] [address], [place], [place] [address]      Labs: BNP (last 3 results) Recent Labs    06/23/21 0630 08/08/21 0823 10/01/21 0119  BNP  2,185.0* 2,353.1* 123XX123*   Basic Metabolic Panel: Recent Labs  Lab 10/01/21 0119 10/01/21 1325 10/02/21 0439 10/04/21 0452 10/05/21 1003 10/06/21 0450  NA 140  --  137 136 136 134*  K 4.8  --  4.4 3.7 3.9 3.5  CL 108  --  100 100 101 98  CO2 22  --  28 28 26 28   GLUCOSE 108*  --  85 109* 184* 146*  BUN 17  --  21* 23* 17 20  CREATININE 1.11 1.25* 1.27* 0.92 1.14 1.26*  CALCIUM 9.2  --  8.2* 8.1* 8.8* 8.2*  MG  --   --   --   --   --  1.8   Liver Function Tests: Recent Labs  Lab 10/01/21 0119 10/01/21 1325 10/02/21 0439  AST 60*  --  52*  ALT 44  --  33  ALKPHOS 128*  --  107  BILITOT 2.5* 2.4* 1.7*  PROT 7.1  --  5.7*  ALBUMIN 3.7  --  2.9*   Recent Labs  Lab 10/01/21 0119  LIPASE 32   No results for input(s): AMMONIA in the last 168 hours. CBC: Recent Labs  Lab 10/01/21 0119 10/01/21 1325 10/02/21 0439  WBC 9.6 8.6 6.7  HGB 18.5* 16.7 15.1  HCT 56.9* 50.5 46.6  MCV 87.9 86.9 86.9  PLT 296 289 252   Cardiac Enzymes: No results for input(s): CKTOTAL, CKMB, CKMBINDEX, TROPONINI in the last 168 hours. BNP: Invalid input(s): POCBNP CBG: No results for input(s): GLUCAP in the last 168 hours. D-Dimer No results for input(s): DDIMER in the last 72 hours. Hgb A1c No results for input(s): HGBA1C in the last 72 hours. Lipid Profile No results for input(s): CHOL, HDL, LDLCALC, TRIG, CHOLHDL, LDLDIRECT in the last 72 hours. Thyroid function studies No results for input(s): TSH, T4TOTAL, T3FREE, THYROIDAB in the last 72 hours.  Invalid input(s): FREET3 Anemia work up No results for input(s): VITAMINB12, FOLATE, FERRITIN, TIBC, IRON, RETICCTPCT in the last 72 hours. Urinalysis    Component Value Date/Time   COLORURINE STRAW (A) 10/01/2021 0330   APPEARANCEUR CLEAR 10/01/2021 0330   LABSPEC 1.005 10/01/2021 0330   PHURINE 6.0 10/01/2021 0330   GLUCOSEU NEGATIVE 10/01/2021 0330   HGBUR NEGATIVE 10/01/2021 0330   BILIRUBINUR NEGATIVE 10/01/2021 0330    KETONESUR  NEGATIVE 10/01/2021 0330   PROTEINUR NEGATIVE 10/01/2021 0330   NITRITE NEGATIVE 10/01/2021 0330   LEUKOCYTESUR NEGATIVE 10/01/2021 0330   Sepsis Labs Invalid input(s): PROCALCITONIN,  WBC,  LACTICIDVEN Microbiology Recent Results (from the past 240 hour(s))  Resp Panel by RT-PCR (Flu A&B, Covid) Nasopharyngeal Swab     Status: None   Collection Time: 10/01/21  3:00 AM   Specimen: Nasopharyngeal Swab; Nasopharyngeal(NP) swabs in vial transport medium  Result Value Ref Range Status   SARS Coronavirus 2 by RT PCR NEGATIVE NEGATIVE Final    Comment: (NOTE) SARS-CoV-2 target nucleic acids are NOT DETECTED.  The SARS-CoV-2 RNA is generally detectable in upper respiratory specimens during the acute phase of infection. The lowest concentration of SARS-CoV-2 viral copies this assay can detect is 138 copies/mL. A negative result does not preclude SARS-Cov-2 infection and should not be used as the sole basis for treatment or other patient management decisions. A negative result may occur with  improper specimen collection/handling, submission of specimen other than nasopharyngeal swab, presence of viral mutation(s) within the areas targeted by this assay, and inadequate number of viral copies(<138 copies/mL). A negative result must be combined with clinical observations, patient history, and epidemiological information. The expected result is Negative.  Fact Sheet for Patients:  EntrepreneurPulse.com.au  Fact Sheet for Healthcare Providers:  IncredibleEmployment.be  This test is no t yet approved or cleared by the [place] FDA and  has been authorized for detection and/or diagnosis of SARS-CoV-2 by FDA under an Emergency Use Authorization (EUA). This EUA will remain  in effect (meaning this test can be used) for the duration of the COVID-19 declaration under Section 564(b)(1) of the Act, 21 U.S.C.section 360bbb-3(b)(1), unless the  authorization is terminated  or revoked sooner.       Influenza A by PCR NEGATIVE NEGATIVE Final   Influenza B by PCR NEGATIVE NEGATIVE Final    Comment: (NOTE) The Xpert Xpress SARS-CoV-2/FLU/RSV plus assay is intended as an aid in the diagnosis of influenza from Nasopharyngeal swab specimens and should not be used as a sole basis for treatment. Nasal washings and aspirates are unacceptable for Xpert Xpress SARS-CoV-2/FLU/RSV testing.  Fact Sheet for Patients: EntrepreneurPulse.com.au  Fact Sheet for Healthcare Providers: IncredibleEmployment.be  This test is not yet approved or cleared by the [place] FDA and has been authorized for detection and/or diagnosis of SARS-CoV-2 by FDA under an Emergency Use Authorization (EUA). This EUA will remain in effect (meaning this test can be used) for the duration of the COVID-19 declaration under Section 564(b)(1) of the Act, 21 U.S.C. section 360bbb-3(b)(1), unless the authorization is terminated or revoked.  Performed at [facility], [place] [address], [place], [place] [address]      Time coordinating discharge:  I have spent 35 minutes face to face with the patient and on the ward discussing the patients care, assessment, plan and disposition with other care givers. >50% of the time was devoted counseling the patient about the risks and benefits of treatment/Discharge disposition and coordinating care.   SIGNED:   Damita Lack, MD  [facility] 10/07/2021, 11:51 AM   If 7PM-7AM, please contact night-coverage

## 2021-10-13 NOTE — Progress Notes (Signed)
HEART & VASCULAR TRANSITION OF CARE CONSULT NOTE     Referring Physician:Dr Hochrein  Primary Care: Primary Cardiologist:Dr Hochrein   HPI: Referred to clinic by Dr Percival Spanish for heart failure consultation.   Daryl [name] is a 61 y.o. with a histoy of HFrEF, NICM,  HIV, CKD Stage IIIb, cocaine abuse, and HTN.  Admitted w/ acute CHF in 2002. Echo showed severely reduced EF 20% w/ restrictive physiology, mod-severe Daryl and mildly reduced RV. LHC showed angiographic evidence of coronary spasm relieved with nitroglycerin in the mid and distal RCA but otherwise minimal disease in the LAD. Per notes, his cardiomyopathy was felt caused by ETOH or viral myocarditis, though has not had a cardiac MRI.   Chest CT in 2020 showed no adenopathy or mass. + emphysema.   Daryl [name] has been followed by Dr. Percival Spanish but pt has not been fully compliant w/ meds. Also h/o syncope and NSVT. Was previously ordered to wear Zio for monitoring but pt did not comply.   Admitted 09/30/21  with increased shortness of breath, found to be in a/c CHF. BNP 3,514. UDS + cocaine. Echo EF 20-25%, GIIIDD, restrictive, severe BAE, mod-severe Daryl and mildly reduced RV. Abdominal US w/ findings c/w cirrhosis with small perihepatic ascites. HFTs minimally elevated, AST 60, ALT 45.   Daryl [name] was diuresed with IV lasix and transitioned to PO lasix 40 mg daily. Had low grade temp on the day of discharged but demanded discharge on 10/07/21. Discharged on limited medical therapy, just Toprol XL 12.5 mg daily + Lasix 40 mg daily. Baseline SCr appears to be ~1.3. Discharge wt was 143 lb.   Daryl [name] was referred to [facility]. Presents today for evaluation.   Wt up 13 lb from 143>>156 lb. ReDs Clip 44%. BP 118/70. EKG shows sinus tach 102 bpm. NYHA Class III symptoms. Complains of abdominal bloating and orthopnea.   Very poor insight. Hesitant to add new meds. Still drinks beer on occasion but not daily, per his report.   ECG: ST 102, narrow QRS (personally  reviewed)  Labs (2/23): K 3.5, creatinine 1.26  REDS clip 40%  PMH: 1. H/o NSVT 2. HTN 3. Hyperlipidemia 4. ETOH abuse 5. Cocaine abuse 6. COPD: Emphysema on CT chest.  Active smoker.  7. 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  8. Chronic systolic CHF: Nonischemic cardiomyopathy. - Echo (2002): EF 20% - LHC (1/20): No significant CAD.  - Echo (2020): EF 20-25% - Echo (2/23): EF 20-25%, global hypokinesis, mildly decreased RV function with moderate RVE, severe biatrial enlargement, moderate-severe functional Daryl, severe TR  Review of Systems: All systems reviewed and negative except as per HPI.  Current Outpatient Medications  Medication Sig Dispense Refill   atorvastatin (LIPITOR) 40 MG tablet Take 1 tablet (40 mg total) by mouth daily. 30 tablet 0   metoprolol succinate (TOPROL-XL) 25 MG 24 hr tablet Take 0.5 tablets (12.5 mg total) by mouth daily. 30 tablet 0   potassium chloride SA (KLOR-CON M) 20 MEQ tablet Take 40 mEq by mouth daily.     tamsulosin (FLOMAX) 0.4 MG CAPS capsule Take 1 capsule (0.4 mg total) by mouth daily. 30 capsule 1   Torsemide 40 MG TABS Take 40 mg by mouth daily. 30 tablet 1   No current facility-administered medications for this encounter.    Allergies  Allergen Reactions   Penicillins Swelling    Did it involve swelling of the face/tongue/throat, SOB, or low BP? Yes Did it involve sudden or severe rash/hives, skin  peeling, or any reaction on the inside of your mouth or nose? No Did you need to seek medical attention at a hospital or doctor's office? Yes When did it last happen?    Daryl [name]   If all above answers are "NO", may proceed with cephalosporin use.       Social History   Socioeconomic History   Marital status: Single    Spouse name: Not on file   Number of children: Not on file   Years of education: Not on file   Highest education level: Not on file  Occupational History   Not on file  Tobacco Use   Smoking status: Every  Day    Packs/day: 0.50    Types: Cigarettes   Smokeless tobacco: Never  Vaping Use   Vaping Use: Never used  Substance and Sexual Activity   Alcohol use: Yes   Drug use: Never   Sexual activity: Not on file  Other Topics Concern   Not on file  Social History Narrative   Not on file   Social Determinants of Health   Financial Resource Strain: Not on file  Food Insecurity: Not on file  Transportation Needs: Not on file  Physical Activity: Not on file  Stress: Not on file  Social Connections: Not on file  Intimate Partner Violence: Not on file     History reviewed. No pertinent family history.  Vitals:   10/14/21 1348  BP: 118/70  Pulse: (!) 110  SpO2: 95%  Weight: 70.8 kg (156 lb)    PHYSICAL EXAM: ReDS 44%  General:  thin AAM. No respiratory difficulty HEENT: normal Neck: supple. JVD elevated to ear. Carotids 2+ bilat; no bruits. No lymphadenopathy or thryomegaly appreciated. Cor: PMI nondisplaced. Regular rhythm, mildly tachy. 3/6 Daryl Lungs: decreased BS at the bases bilaterally  Abdomen: soft, nontender, mildly distended. No hepatosplenomegaly. No bruits or masses. Good bowel sounds. Extremities: no cyanosis, clubbing, rash, edema Neuro: alert & oriented x 3, cranial nerves grossly intact. moves all 4 extremities w/o difficulty. Affect pleasant.  ECG: Sinus tach 102 bpm    ASSESSMENT & PLAN:  Acute on Chronic Biventricular Heart Failure - NICM - Echo 2020 EF 20-25% mildly reduced RV - LHC 2020 m-dRCA coronary spasm relieved w/ NTG, otherwise minimal disease in the LAD -CM felt 2/2 ETOH vs viral myocarditis, though has never had cMRI  -Echo 2/23 EF 20%, GIIIDD (restrictive), RV mildly reduced  -NYHA Class III. Volume overload on exam and by ReDs Clip, 44%. Wt up 13 lb since hospital d/c - Stop Lasix, Switch to Torsemide 40 mg daily  - Doubt BP will tolerate Entresto  - Add Farxiga 10 mg daily (last GFR >60). Check HgA1c  - Add Spiro 12.5 mg daily  -  Continue Toprol XL 12.5 mg daily  - Would also benefit from cMRI once better diuresed - refer [facility]. Would benefit from paramedicine if Daryl [name] is agreeable  - HF SW consult  - cessation from cocaine and ETOH imperative  - given h/o poor compliance and recent substance use, not a current candidate for advanced therapies (VAD or transplant)   2. Mitral Regurgitation  - Mod-severe on echo, suspect likely functional  - HF optimization per above   3. Polysubstance Use - Cocaine and ETOH use. UDS 2/22 + for cocaine - needs to quit   4. HTN - controlled on current regimen - GDMT titration per above  5. CAD - minimal CAD on cath in 2020 -  on statin   6. SDOH - h/o poor compliance. Based on interview, suspect poor literacy level  - needs paramedicine - HF team SW to see today  - needs PCP, refer to [facility]   Functional Class: NYHA III -Diuretic- Torsemide 40 mg daily  -ARNI/ARB- no yet, BP soft  -? blocker - continue Toprol XL 12.5 mg daily  -MRA - Add Spiro 12.5 mg daily  -SGLT2i Add Farxiga 10 mg daily     Referred to HFSW (PCP, Medications, Transportation Drug Abuse, Financial ): Yes  Refer to Pharmacy: Yes Refer to Home Health:  No Refer to [facility] Clinic: Yes --->Dr Aundra Dubin  Refer to General Cardiology: Shared with Dr Percival Spanish  Daryl Jester, Daryl [name]   Follow up  w/ Dr. Aundra Dubin in 3 wks   Patient seen with PA, agree with the above note.   Daryl [name] had a recent admission and is seen here in followup, referred by Dr. Percival Spanish.    Daryl [name] is on minimal meds and is skeptical of medical management of his cardiomyopathy.  His main concern is for BPH symptoms (slow stream, hard to empty bladder). Daryl [name] thinks that this is why Daryl [name] has heart failure.  Daryl [name] has started to gain weight again since recent discharge.  Daryl [name] denies dyspnea walking on flat ground but gets short of breath with inclines. No chest pain.   Daryl [name] was positive for cocaine at last admission in 2/23.   General:  NAD Neck: JVP 12-14 cm, no thyromegaly or thyroid nodule.  Lungs: Clear to auscultation bilaterally with normal respiratory effort. CV: Nondisplaced PMI.  Heart regular S1/S2, no S3/S4, 3/6 HSM apex.  1+ ankle edema.  No carotid bruit.  Normal pedal pulses.  Abdomen: Soft, nontender, no hepatosplenomegaly, no distention.  Skin: Intact without lesions or rashes.  Neurologic: Alert and oriented x 3.  Psych: Normal affect. Extremities: No clubbing or cyanosis.  HEENT: Normal.   Patient is volume overloaded, Daryl [name] is on an inadequate dose of diuretic at this time and minimal GDMT. Cardiomyopathy seems most likely to be due to ETOH/cocaine (nonischemic). Daryl [name] is very reticent to take medicine, but after much discussion and my agreement to give him medication for BPH, Daryl [name] agrees to take cardiac meds.  - Stop Lasix, start torsemide 40 mg daily.  BMET today and in 10 days.  - Start spironolactone 12.5 daily.  - Start [place] 10 daily.  - Continue Toprol XL 12.5 mg daily.  - Ideally, would add Entresto next.  - abstinence from cocaine and ETOH is imperative.  - Eventual cardiac MRI.   Patient has functional moderate-severe Daryl and severe TR.  Will optimize GDMT and if Daryl remains significant, can consider Mitraclip.   Patient reports BPH symptoms.  This bothers him more than anything else right now.   - I will give him Flomax 0.4.  - I will try to arrange for a PCP.   Loralie Champagne 10/14/2021

## 2021-10-14 ENCOUNTER — Encounter (HOSPITAL_COMMUNITY): Payer: Self-pay

## 2021-10-14 ENCOUNTER — Other Ambulatory Visit: Payer: Self-pay

## 2021-10-14 ENCOUNTER — Other Ambulatory Visit (HOSPITAL_COMMUNITY): Payer: Self-pay

## 2021-10-14 ENCOUNTER — Ambulatory Visit (HOSPITAL_COMMUNITY)
Admit: 2021-10-14 | Discharge: 2021-10-14 | Disposition: A | Discharge status: Home / Self Care | Payer: Medicaid Other | Attending: Cardiology | Admitting: Cardiology

## 2021-10-14 ENCOUNTER — Telehealth (HOSPITAL_COMMUNITY): Payer: Self-pay

## 2021-10-14 VITALS — BP 118/70 | HR 110 | Wt 156.0 lb

## 2021-10-14 DIAGNOSIS — I5082 Biventricular heart failure: Secondary | ICD-10-CM | POA: Diagnosis not present

## 2021-10-14 DIAGNOSIS — B20 Human immunodeficiency virus [HIV] disease: Secondary | ICD-10-CM | POA: Diagnosis not present

## 2021-10-14 DIAGNOSIS — N1832 Chronic kidney disease, stage 3b: Secondary | ICD-10-CM | POA: Insufficient documentation

## 2021-10-14 DIAGNOSIS — I130 Hypertensive heart and chronic kidney disease with heart failure and stage 1 through stage 4 chronic kidney disease, or unspecified chronic kidney disease: Secondary | ICD-10-CM | POA: Diagnosis not present

## 2021-10-14 DIAGNOSIS — F1410 Cocaine abuse, uncomplicated: Secondary | ICD-10-CM | POA: Insufficient documentation

## 2021-10-14 DIAGNOSIS — N400 Enlarged prostate without lower urinary tract symptoms: Secondary | ICD-10-CM | POA: Diagnosis not present

## 2021-10-14 DIAGNOSIS — I2510 Atherosclerotic heart disease of native coronary artery without angina pectoris: Secondary | ICD-10-CM | POA: Diagnosis not present

## 2021-10-14 DIAGNOSIS — H543 Unqualified visual loss, both eyes: Secondary | ICD-10-CM | POA: Diagnosis not present

## 2021-10-14 DIAGNOSIS — R188 Other ascites: Secondary | ICD-10-CM | POA: Insufficient documentation

## 2021-10-14 DIAGNOSIS — F1010 Alcohol abuse, uncomplicated: Secondary | ICD-10-CM | POA: Insufficient documentation

## 2021-10-14 DIAGNOSIS — R0601 Orthopnea: Secondary | ICD-10-CM | POA: Insufficient documentation

## 2021-10-14 DIAGNOSIS — I428 Other cardiomyopathies: Secondary | ICD-10-CM

## 2021-10-14 DIAGNOSIS — R140 Abdominal distension (gaseous): Secondary | ICD-10-CM | POA: Insufficient documentation

## 2021-10-14 DIAGNOSIS — I5042 Chronic combined systolic (congestive) and diastolic (congestive) heart failure: Secondary | ICD-10-CM | POA: Diagnosis not present

## 2021-10-14 DIAGNOSIS — I081 Rheumatic disorders of both mitral and tricuspid valves: Secondary | ICD-10-CM | POA: Diagnosis not present

## 2021-10-14 DIAGNOSIS — Z550 Illiteracy and low-level literacy: Secondary | ICD-10-CM | POA: Diagnosis not present

## 2021-10-14 DIAGNOSIS — Z79899 Other long term (current) drug therapy: Secondary | ICD-10-CM | POA: Diagnosis not present

## 2021-10-14 DIAGNOSIS — I472 Ventricular tachycardia, unspecified: Secondary | ICD-10-CM | POA: Insufficient documentation

## 2021-10-14 DIAGNOSIS — I5022 Chronic systolic (congestive) heart failure: Secondary | ICD-10-CM | POA: Diagnosis not present

## 2021-10-14 DIAGNOSIS — J439 Emphysema, unspecified: Secondary | ICD-10-CM | POA: Insufficient documentation

## 2021-10-14 DIAGNOSIS — K7460 Unspecified cirrhosis of liver: Secondary | ICD-10-CM | POA: Diagnosis not present

## 2021-10-14 LAB — BASIC METABOLIC PANEL
Anion gap: 9 (ref 5–15)
BUN: 11 mg/dL (ref 6–20)
CO2: 26 mmol/L (ref 22–32)
Calcium: 9.3 mg/dL (ref 8.9–10.3)
Chloride: 104 mmol/L (ref 98–111)
Creatinine, Ser: 0.92 mg/dL (ref 0.61–1.24)
GFR, Estimated: >60 mL/min (ref >60)
Glucose, Bld: 111 mg/dL — ABNORMAL HIGH (ref 70–99)
Potassium: 4 mmol/L (ref 3.5–5.1)
Sodium: 139 mmol/L (ref 135–145)

## 2021-10-14 MED ORDER — TORSEMIDE 40 MG PO TABS
40.0000 mg | ORAL_TABLET | Freq: Every day | ORAL | 1 refills | Status: DC
  Filled 2021-10-14: qty 30, 30d supply, fill #0

## 2021-10-14 MED ORDER — DAPAGLIFLOZIN PROPANEDIOL 10 MG PO TABS
10.0000 mg | ORAL_TABLET | Freq: Every day | ORAL | 0 refills | Status: DC
  Filled 2021-10-14 – 2021-11-20 (×2): qty 30, 30d supply, fill #0

## 2021-10-14 MED ORDER — TAMSULOSIN HCL 0.4 MG PO CAPS
0.4000 mg | ORAL_CAPSULE | Freq: Every day | ORAL | 1 refills | Status: DC
  Filled 2021-10-14 – 2021-11-20 (×2): qty 30, 30d supply, fill #0
  Filled 2022-03-03: qty 30, 30d supply, fill #1

## 2021-10-14 MED ORDER — TORSEMIDE 20 MG PO TABS
40.0000 mg | ORAL_TABLET | Freq: Two times a day (BID) | ORAL | 0 refills | Status: DC
  Filled 2021-10-14 – 2021-11-20 (×2): qty 90, 23d supply, fill #0

## 2021-10-14 NOTE — Progress Notes (Signed)
ReDS Vest / Clip - 10/14/21 1400       ReDS Vest / Clip   Station Marker C    Ruler Value 27    ReDS Value Range High volume overload    ReDS Actual Value 44

## 2021-10-14 NOTE — Patient Instructions (Addendum)
STOP Lasix  START Farxiga 10 mg daily  START Torsemide 40 mg daily(2 tablets)  START Flomax 0.4 mg daily( for prostate)  START Spironolactone 12.5 mg (1/2 tab) daily  Lab work done today we will call you with any abnormal results  Follow up lab work in 10 days  Your physician recommends that you schedule a follow-up appointment in: 3 weeks With Dr. Shirlee Latch  You have been referred to community health and wellness Appt for April 18 @1  :40 PM  If you have any questions or concerns before your next appointment please send a message through [place] or call our office at [phone].    TO LEAVE A MESSAGE FOR THE NURSE SELECT OPTION 2, PLEASE LEAVE A MESSAGE INCLUDING: YOUR NAME DATE OF BIRTH CALL BACK NUMBER REASON FOR CALL**this is important as we prioritize the call backs  YOU WILL RECEIVE A CALL BACK THE SAME DAY AS LONG AS YOU CALL BEFORE 4:00 PM  At the [facility], you and your health needs are our priority. As part of our continuing mission to provide you with exceptional heart care, we have created designated Provider Care Teams. These Care Teams include your primary Cardiologist (physician) and Advanced Practice Providers (APPs- Physician Assistants and Nurse Practitioners) who all work together to provide you with the care you need, when you need it.   You may see any of the following providers on your designated Care Team at your next follow up: Dr [phone] Dr Arvilla Meres, NP Carron Curie, Robbie Lis [facility], [place], PharmD   Please be sure to bring in all your medications bottles to every appointment.   Do the following things EVERYDAY: Weigh yourself in the morning before breakfast. Write it down and keep it in a log. Take your medicines as prescribed Eat low salt foods--Limit salt (sodium) to 2000 mg per day.  Stay as active as you can everyday Limit all fluids for the day to less than 2  liters

## 2021-10-14 NOTE — Progress Notes (Signed)
Heart and Vascular Care Navigation  10/14/2021  Daryl [name] July 06, 1961 [id number]  Reason for Referral: Patient seen in HF [facility]   Engaged with patient face to face for initial visit for Heart and Vascular Care Coordination.                                                                                                   Assessment:  Patient is a 61 yo male who has been living with friends for a "few years" now. Patient states he has no income although does have medicaid healthy blue.  Patient reports he has a car and struggles to afford gas and medications. Patient states he has applied for disability in the past but unsure the current status. Patient states he has paperwork in the car and has not had time to read through it yet regarding his disability. CSW offered Paramedicine Program to assist with medications and education although patient refused at this time.       HRT/VAS Care Coordination     Patients Home Cardiology Office Heart Failure Clinic  HF [facility]   Outpatient Care Team Social Worker   Social Worker Name: Lasandra Beech, [place] [phone]   Living arrangements for the past 2 months Single Family Home   Lives with: Friends  Staying with friends for the last few years   Patient Current Insurance Coverage Medicaid   Patient Has Concern With Paying Medical Bills No   Does Patient Have Prescription Coverage? Yes   Home Assistive Devices/Equipment Scales   DME Agency NA   HH Agency NA       Social History:                                                                             SDOH Screenings   Alcohol Screen: Not on file  Depression (PHQ2-9): Not on file  Financial Resource Strain: High Risk   Difficulty of Paying Living Expenses: Hard  Food Insecurity: Food Insecurity Present   Worried About Running Out of Food in the Last Year: Sometimes true   Ran Out of Food in the Last Year: Sometimes true  Housing: High Risk   Last Housing Risk Score: 2  Physical Activity:  Not on file  Social Connections: Not on file  Stress: Not on file  Tobacco Use: High Risk   Smoking Tobacco Use: Every Day   Smokeless Tobacco Use: Never   Passive Exposure: Not on file  Transportation Needs: No Transportation Needs   Lack of Transportation (Medical): No   Lack of Transportation (Non-Medical): No    SDOH Interventions: Financial Resources:  Financial Strain Interventions: Other (Comment) (Patient Care Fund) Provided gift cards to assist with gas and medications  Food Insecurity:  Food Insecurity Interventions: Intervention Not Indicated (Patient has food stamps)  Housing  Insecurity:  Housing Interventions: Intervention Not Indicated (Currently able to remain in friends home)  Transportation:   Transportation Interventions: Intervention Not Indicated    Follow-up plan:  Patient will read paperwork and return call to CSW to discuss pursuing assistance with disability if needed.   Patient has follow up appointment in the HF clinic and will follow up with CSW as needed.    Lasandra Beech, LCSW, CCSW-MCS [phone]

## 2021-10-14 NOTE — Telephone Encounter (Signed)
Heart Failure Patient Advocate Encounter   Received notification from [facility] Medicaid that prior authorization for Daryl [name] is required.   PA submitted on CoverMyMeds Key [id number] Status is approved  Copay $4  Sharen Hones, PharmD, BCPS Heart Failure Stewardship Pharmacist Phone [phone]

## 2021-10-21 ENCOUNTER — Other Ambulatory Visit: Payer: Self-pay

## 2021-10-24 ENCOUNTER — Other Ambulatory Visit (HOSPITAL_COMMUNITY): Payer: Medicaid Other

## 2021-10-24 NOTE — Progress Notes (Deleted)
?  ? ?Cardiology Office Note ? ? ?Date:  10/24/2021  ? ?ID:  Daryl [name], DOB 1960-09-10, MRN [id number] ? ?PCP:  Patient, No Pcp Per (Inactive)  ?Cardiologist:   Minus Breeding, MD ? ? ?No chief complaint on file. ? ? ? ?  ?History of Present Illness: ?Daryl [name] is a 61 y.o. male who presents for  follow up post sepsis and acute hypoxic respiratory failure in the setting of rhinovirus infection, superimposed on bacterial infection.   This was in Jan 2020.  EF was 20%.  He had coronary calcification on CT.  He underwent a RHC/LHC revealed non-obstructive CAD with no more than 20% stenosis in any of his coronary arteries. He had moderate to severe MR. There was evidence of coronary spasm relieved with NTG. 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 He was placed on Bidil and lasix. No ACE or ARB due to CKD.  However I reviewed notes from October/Nov when he was in the hospital last time.  He was hospitalized with acute on chronic systolic heart failure and also had alcohol withdrawal delirium.  He was actually discharged on Avapro along with other medicines listed below.  He has had many ER visits and behavioral health visits.  ? ?He presents for follow up.  At the last visit I was able to increase his Avapro.  However, we were not able to draw blood.  ***  ? ?*** I was able to start Lasix and have another frank conversation with him about his risk of dying with his nonadherence and his severe cardiomyopathy.  I think he is taking his medications as prescribed.  However, he is having a problem with frequent urination and he is bothered by this.  He does have lower extremity swelling.  He is not describing any new shortness of breath, PND or orthopnea.  He says he has not been drinking alcohol.  He has not had any palpitations, presyncope or syncope.  He has had no chest pain.  Of note his weight is down a few pounds since I saw him. ? ? ?Past Medical History:  ?Diagnosis Date  ? Asthma   ?  Chronic combined systolic and diastolic CHF (congestive heart failure) ([place])   ? Dyslipidemia   ? Essential hypertension 11/02/2018  ? ETOH abuse 02/14/2020  ? Nonobstructive CAD   ? Non-obstructive disease on cardiac cath in 08/2018  ? NSVT (nonsustained ventricular tachycardia)   ? PNA (pneumonia) 09/11/2018  ? Sepsis ([place]) 09/11/2018  ? Stage 3a chronic kidney disease ([place]) 02/19/2020  ? Tobacco abuse 09/11/2018  ? ? ?Past Surgical History:  ?Procedure Laterality Date  ? IR THORACENTESIS ASP PLEURAL SPACE W/IMG GUIDE  09/13/2018  ? RIGHT/LEFT HEART CATH AND CORONARY ANGIOGRAPHY N/A 09/15/2018  ? Procedure: RIGHT/LEFT HEART CATH AND CORONARY ANGIOGRAPHY;  Surgeon: Daryl Man, MD;  Location: [facility];  Service: Cardiovascular;  Laterality: N/A;  ? ? ? ?Current Outpatient Medications  ?Medication Sig Dispense Refill  ? atorvastatin (LIPITOR) 40 MG tablet Take 1 tablet (40 mg total) by mouth daily. 30 tablet 0  ? dapagliflozin propanediol (FARXIGA) 10 MG TABS tablet Take 1 tablet (10 mg total) by mouth daily before breakfast. 30 tablet 0  ? metoprolol succinate (TOPROL-XL) 25 MG 24 hr tablet Take 0.5 tablets (12.5 mg total) by mouth daily. 30 tablet 0  ? potassium chloride SA (KLOR-CON M) 20 MEQ tablet Take 40 mEq by mouth daily.    ?  tamsulosin (FLOMAX) 0.4 MG CAPS capsule Take 1 capsule (0.4 mg total) by mouth daily. 30 capsule 1  ? torsemide (DEMADEX) 20 MG tablet Take 2 tablets (40 mg total) by mouth 2 (two) times daily. 90 tablet 0  ? ?No current facility-administered medications for this visit.  ? ? ?Allergies:   Penicillins  ? ? ?ROS:  Please see the history of present illness.   Otherwise, review of systems are positive for mucous in his *** .   All other systems are reviewed and negative.  ? ? ?PHYSICAL has been back on him now just to be met: ?VS:  There were no vitals taken for this visit. , BMI There is no height or weight on file to calculate BMI. ?GENERAL:  Well appearing ?NECK:  No jugular  venous distention, waveform within normal limits, carotid upstroke brisk and symmetric, no bruits, no thyromegaly ?LUNGS:  Clear to auscultation bilaterally ?CHEST:  Unremarkable ?HEART:  PMI not displaced or sustained,S1 and S2 within normal limits, no S3, no S4, no clicks, no rubs, *** murmurs ?ABD:  Flat, positive bowel sounds normal in frequency in pitch, no bruits, no rebound, no guarding, no midline pulsatile mass, no hepatomegaly, no splenomegaly ?EXT:  2 plus pulses throughout, no edema, no cyanosis no clubbing ? ? ? ?***GENERAL:  Well appearing ?NECK:  No jugular venous distention, waveform within normal limits, carotid upstroke brisk and symmetric, no bruits, no thyromegaly ?LUNGS:  Clear to auscultation bilaterally ?CHEST:  Unremarkable ?HEART:  PMI not displaced or sustained,S1 and S2 within normal limits, no S3, no S4, no clicks, no rubs, 3 out of 6 systolic murmur radiating slightly at the aortic outflow tract, no diastolic murmurs ?ABD:  Flat, positive bowel sounds normal in frequency in pitch, no bruits, no rebound, no guarding, no midline pulsatile mass, no hepatomegaly, no splenomegaly ?EXT:  2 plus pulses throughout, moderate leg edema, no cyanosis no clubbing ? ? ?EKG:  EKG is *** ordered today. ?*** ? ? ?Recent Labs: ?05/17/2021: TSH 3.640 ?10/01/2021: B Natriuretic Peptide 3,514.2 ?10/02/2021: ALT 33; Hemoglobin 15.1; Platelets 252 ?10/06/2021: Magnesium 1.8 ?10/14/2021: BUN 11; Creatinine, Ser 0.92; Potassium 4.0; Sodium 139  ? ? ?Lipid Panel ?   ?Component Value Date/Time  ? CHOL 127 09/15/2018 0441  ? TRIG 124 09/15/2018 0441  ? HDL 33 (L) 09/15/2018 0441  ? CHOLHDL 3.8 09/15/2018 0441  ? VLDL 25 09/15/2018 0441  ? [place] 69 09/15/2018 0441  ? ?  ? ?Wt Readings from Last 3 Encounters:  ?10/14/21 156 lb (70.8 kg)  ?10/07/21 143 lb 12.8 oz (65.2 kg)  ?09/05/21 174 lb 6.4 oz (79.1 kg)  ?  ? ? ?Other studies Reviewed: ?Additional studies/ records that were reviewed today include:   *** ?Review of the  above records demonstrates:  *** ? ? ?ASSESSMENT AND PLAN: ? ?  ?NICM:    He was to get blood work at the hospital.  ***  Unfortunately we were not able to get blood work today because he is a difficult stick.  I am going to send him to the hospital to get this done in about 7 days.  I am going to take this opportunity to increase his Avapro to a full pill.  I think he has been compliant with medications and his blood pressure should tolerate.   We talked about keeping his feet elevated.  He does not want to wear compression stockings.  Hopefully if he stays compliant with these visits there is a better  chance of him surviving with his severe nonischemic cardiomyopathy.   ? ?Syncope:   *** He is not having any syncope.  He has a history of nonsustained ventricular tachycardia.  No change in therapy at this point pending response to med titration.  ? ?Hypertension:    ***  This is being managed in the context of treating his CHF  ?  ?CKD:     CKD IIIa.  ***  I will check this as above. ? ?ETOH Abuse:   ***  He reports that he is abstaining. ? ?Current medicines are reviewed at length with the patient today.  The patient does not have concerns regarding medicines. ? ?The following changes have been made:   ***  ? ?Labs/ tests ordered today include:   ***  ?No orders of the defined types were placed in this encounter. ? ? ? ?Disposition:   FU with me or *** in one month.  ? ?Signed, ?Minus Breeding, MD  ?10/24/2021 2:21 PM    ?[place] ?

## 2021-10-25 ENCOUNTER — Telehealth: Payer: Self-pay

## 2021-10-25 ENCOUNTER — Ambulatory Visit: Payer: Medicaid Other | Admitting: Cardiology

## 2021-10-25 NOTE — Telephone Encounter (Signed)
Called the provided number to check to see if the pt was coming to the appt. His son answered the phone he states he spoke with the pt yesterday about the appt and will call him to make sure he comes today.  ?

## 2021-11-06 ENCOUNTER — Encounter: Payer: Self-pay | Admitting: Cardiology

## 2021-11-09 ENCOUNTER — Emergency Department (HOSPITAL_COMMUNITY)
Admission: EM | Admit: 2021-11-09 | Discharge: 2021-11-10 | Discharge status: Against Medical Advice | Payer: Medicaid Other | Attending: Emergency Medicine | Admitting: Emergency Medicine

## 2021-11-09 ENCOUNTER — Emergency Department (HOSPITAL_COMMUNITY): Payer: Medicaid Other

## 2021-11-09 ENCOUNTER — Other Ambulatory Visit: Payer: Self-pay

## 2021-11-09 ENCOUNTER — Encounter (HOSPITAL_COMMUNITY): Payer: Self-pay

## 2021-11-09 DIAGNOSIS — R002 Palpitations: Secondary | ICD-10-CM | POA: Diagnosis not present

## 2021-11-09 DIAGNOSIS — R079 Chest pain, unspecified: Secondary | ICD-10-CM | POA: Insufficient documentation

## 2021-11-09 DIAGNOSIS — R0602 Shortness of breath: Secondary | ICD-10-CM | POA: Insufficient documentation

## 2021-11-09 DIAGNOSIS — R059 Cough, unspecified: Secondary | ICD-10-CM | POA: Diagnosis not present

## 2021-11-09 NOTE — ED Triage Notes (Signed)
Patient reports "feeling funny" since Wednesday. Pt reports that his chest starts hurting and he gets SOB when he lays down at night. Pt poor historian.  ?

## 2021-11-09 NOTE — ED Notes (Signed)
Pt is refusing lab work.

## 2021-11-09 NOTE — ED Notes (Signed)
Pt. Refused lab draw. RN made aware. ?

## 2021-11-09 NOTE — ED Provider Notes (Signed)
?[facility]ERGENCY DEPT ?[facility] Emergency Department ?Provider Note ?MRN:  [id number]  ?Arrival date & time: 11/10/21    ? ?Chief Complaint   ?Chest Pain ?  ?History of Present Illness   ?Daryl [name] is a 61 y.o. year-old male presents to the ED with chief complaint of chest pain.  Patient states that he has been having intermittent chest pains and palpitations with associated shortness of breath for the past couple of years.  He states that he is "tired of telling the same story."  Chart review shows that he has CHF and cocaine abuse.  He states that he has had some cough.  He denies fevers or chills.  Has history of medication noncompliance. ? ?History provided by patient. ? ? ?Review of Systems  ?Pertinent review of systems noted in HPI.  ? ? ?Physical Exam  ? ?Vitals:  ? 11/10/21 0100 11/10/21 0130  ?BP: (!) 120/96   ?Pulse: (!) 37 79  ?Resp: (!) 37 14  ?Temp:    ?SpO2: 94% (!) 78%  ?  ?CONSTITUTIONAL:  nontoxic-appearing, NAD ?NEURO:  Alert and oriented x 3, CN 3-12 grossly intact ?EYES:  eyes equal and reactive ?ENT/NECK:  Supple, no stridor  ?CARDIO:  normal rate, regular rhythm, appears well-perfused  ?PULM:  No respiratory distress,  ?GI/GU:  non-distended,  ?MSK/SPINE:  No gross deformities, no edema, moves all extremities  ?SKIN:  no rash, atraumatic ? ? ?*Additional and/or pertinent findings included in MDM below ? ?Diagnostic and Interventional Summary  ? ? ?Labs Reviewed  ?BASIC METABOLIC PANEL  ?BRAIN NATRIURETIC PEPTIDE  ?RAPID URINE DRUG SCREEN, HOSP PERFORMED  ?TROPONIN I (HIGH SENSITIVITY)  ?TROPONIN I (HIGH SENSITIVITY)  ?  ?DG Chest 2 View  ?Final Result  ?  ?  ?Medications - No data to display  ? ?Procedures  /  Critical Care ?Procedures ? ?ED Course and Medical Decision Making  ?I have reviewed the triage vital signs, the nursing notes, and pertinent available records from the EMR. ? ?Complexity of Problems Addressed: ?High Complexity: Acute illness/injury posing a threat to life or  bodily function, requiring emergent diagnostic workup, evaluation, and treatment as below. ?Comorbidities affecting this illness/injury include: ?CHF, cocaine abuse, CKD ?Social Determinants Affecting Care: ?Complexity of care is increased due to drug abuse/addiction. ? ? ?ED Course: ?After considering the following differential, CHF exacerbation, ACS, PE, pneumonia, I ordered labs and imaging. ? ?Clinical Course as of 11/10/21 0143  ?Wynelle Link Nov 10, 2021  ?0139 Patient refusing further work-up.  Will not consent to labs or further workup.  I had several discussions with the patient that if he won't let us treat him, he is acting against medical advice and will be discharged to allow [place] to care of other patients that need to be seen as well. ? ?He has been seen by multiple nurses including charge RN and has refused further workup from everyone. [RB]  ?  ?Clinical Course User Index ?[RB] Roxy Horseman, PA-C  ? ? ?Patients that they would like to leave.  I have discussed my concerns with the patient about them leaving without completing the evaluation.   Patient has capacity to make own medical decisions. ? ?Patient presents with chest pains. ? ?Symptoms include: chest pain and intermittent SOB.  This is acute on chronic. ? ?Concern for: ACS, PE, CHF exacerbation ? ?Study limitations and other tests offered include: Labs, advanced imaging ? ?Treatment and recommended follow-up include: Being compliant with home meds. ? ?I do not feel that  the patient should leave prior to completing their workup. I have discussed the above symptoms, initial findings, study limitations, and treatment plan with the patient. Patient is not altered, does not have any distracting issues, and has capacity to make decisions for themselves. Patient places themselves at risk of chf exacerbation, ACS, PE,  worsening symptoms, disability, morbidity and/or death. ? ? ?Final Clinical Impressions(s) / ED Diagnoses  ? ?  ICD-10-CM   ?1. Chest pain,  unspecified type  R07.9   ?  ?  ?ED Discharge Orders   ? ? None  ? ?  ?  ? ? ?Discharge Instructions Discussed with and Provided to Patient:  ? ?Discharge Instructions   ?None ?  ? ?  ?Roxy Horseman, PA-C ?11/10/21 0144 ? ?  ?Tilden Fossa, MD ?11/10/21 0211 ? ?

## 2021-11-09 NOTE — ED Provider Triage Note (Signed)
Emergency Medicine Provider Triage Evaluation Note ? ?Daryl [name] , a 61 y.o. male  was evaluated in triage.  Pt complains of sob. ? ?Review of Systems  ?Positive: Cp, sob, fatigue ?Negative: Fever, cough, n/v/d ? ?Physical Exam  ?BP (!) 117/101 (BP Location: Left Arm)   Pulse 78   Temp 97.8 ?F (36.6 ?C) (Oral)   Resp (!) 22   Ht 5\' 9"  (1.753 m)   Wt 72.6 kg   SpO2 95%   BMI 23.63 kg/m?  ?Gen:   Awake, no distress   ?Resp:  Normal effort  ?MSK:   Moves extremities without difficulty  ?Other:   ? ?Medical Decision Making  ?Medically screening exam initiated at 9:36 PM.  Appropriate orders placed.  Daryl [name] was informed that the remainder of the evaluation will be completed by another provider, this initial triage assessment does not replace that evaluation, and the importance of remaining in the ED until their evaluation is complete. ? ?Report for the past 4 days he has been experiencing worsening SOB when laying down, pain in his L chest and fatigue. Have not notice any fluid gain. ?  ?Domenic Moras, PA-C ?11/09/21 2140 ? ?

## 2021-11-10 NOTE — ED Notes (Signed)
Pt refuses to cooperate with lab work. PA made aware and requests security escort to remove patient from ED.  ?

## 2021-11-10 NOTE — ED Notes (Signed)
Discussed lab work with patient. He refuses to allow anyone to obtain blood work. He states that no one here knows how to get blood.  ?

## 2021-11-10 NOTE — ED Notes (Signed)
Patient continues to refuse lab work.  Requesting to have a bedside commode.  Lights in room dimmed for comfort  ?

## 2021-11-10 NOTE — ED Notes (Signed)
Patient provided with bedside commode.  Placed on 2 L [place] for comfort.  Patient states "it has helped me in the past." ?

## 2021-11-10 NOTE — ED Notes (Signed)
Attempt IV start x 2 in patient's R AC.  Patient moved arm away from RN both times.  Unable to obtain labs or IV.  Attempt 3rd IV start to R lower arm.  Patient continues to move arm and try to instruct RN how to start an IV.  Second RN to bedside to attempt IV and patient is refusing.  States "ain't no one touching my arm."  PA to be made aware  ?

## 2021-11-13 ENCOUNTER — Encounter (HOSPITAL_COMMUNITY): Payer: Medicaid Other | Admitting: Cardiology

## 2021-11-20 ENCOUNTER — Other Ambulatory Visit (HOSPITAL_COMMUNITY): Payer: Self-pay

## 2021-11-20 ENCOUNTER — Other Ambulatory Visit: Payer: Self-pay

## 2021-12-10 ENCOUNTER — Ambulatory Visit (INDEPENDENT_AMBULATORY_CARE_PROVIDER_SITE_OTHER): Payer: Medicaid Other | Admitting: Primary Care

## 2022-01-31 ENCOUNTER — Encounter (HOSPITAL_BASED_OUTPATIENT_CLINIC_OR_DEPARTMENT_OTHER): Payer: Self-pay | Admitting: Emergency Medicine

## 2022-01-31 ENCOUNTER — Emergency Department (HOSPITAL_BASED_OUTPATIENT_CLINIC_OR_DEPARTMENT_OTHER)
Admission: EM | Admit: 2022-01-31 | Discharge: 2022-01-31 | Disposition: A | Discharge status: Home / Self Care | Payer: Medicaid Other | Attending: Emergency Medicine | Admitting: Emergency Medicine

## 2022-01-31 ENCOUNTER — Other Ambulatory Visit: Payer: Self-pay

## 2022-01-31 ENCOUNTER — Emergency Department (HOSPITAL_BASED_OUTPATIENT_CLINIC_OR_DEPARTMENT_OTHER): Payer: Medicaid Other | Admitting: Radiology

## 2022-01-31 DIAGNOSIS — I130 Hypertensive heart and chronic kidney disease with heart failure and stage 1 through stage 4 chronic kidney disease, or unspecified chronic kidney disease: Secondary | ICD-10-CM | POA: Insufficient documentation

## 2022-01-31 DIAGNOSIS — F17210 Nicotine dependence, cigarettes, uncomplicated: Secondary | ICD-10-CM | POA: Diagnosis not present

## 2022-01-31 DIAGNOSIS — J45909 Unspecified asthma, uncomplicated: Secondary | ICD-10-CM | POA: Insufficient documentation

## 2022-01-31 DIAGNOSIS — N1831 Chronic kidney disease, stage 3a: Secondary | ICD-10-CM | POA: Diagnosis not present

## 2022-01-31 DIAGNOSIS — M25561 Pain in right knee: Secondary | ICD-10-CM | POA: Diagnosis present

## 2022-01-31 DIAGNOSIS — I5042 Chronic combined systolic (congestive) and diastolic (congestive) heart failure: Secondary | ICD-10-CM | POA: Diagnosis not present

## 2022-01-31 MED ORDER — NAPROXEN 500 MG PO TABS
500.0000 mg | ORAL_TABLET | Freq: Two times a day (BID) | ORAL | 0 refills | Status: DC
  Filled 2022-01-31: qty 30, 15d supply, fill #0

## 2022-01-31 NOTE — ED Triage Notes (Signed)
Pt BIB [facility] from home c/o right knee pain onset about 1 hour ago. Denies any known injury. Tenderness to touch. Mobility/sensation intact. No Hx blood clots. No deformity noted. Pt ambulatory on ED arrival.

## 2022-01-31 NOTE — ED Provider Notes (Signed)
[name] Emergency Department Provider Note MRN:  [id number]  Arrival date & time: 01/31/22     Chief Complaint   Knee Pain   History of Present Illness   Daryl [name] is a 61 y.o. year-old male with a history of high blood pressure presenting to the ED with chief complaint of knee pain.  Walked several miles [name], Daryl having some right knee pain.  This is happened in the past.  Knee seems swollen earlier, seems better Daryl.  No other injuries or complaints.  Review of Systems  A thorough review of systems was obtained and all systems are negative except as noted in the HPI and PMH.   Patient's Health History    Past Medical History:  Diagnosis Date   Asthma    Chronic combined systolic and diastolic CHF (congestive heart failure) ([place])    Dyslipidemia    Essential hypertension 11/02/2018   ETOH abuse 02/14/2020   Nonobstructive CAD    Non-obstructive disease on cardiac cath in 08/2018   NSVT (nonsustained ventricular tachycardia) ([facility])    PNA (pneumonia) 09/11/2018   Sepsis ([place]) 09/11/2018   Stage 3a chronic kidney disease ([place]) 02/19/2020   Tobacco abuse 09/11/2018    Past Surgical History:  Procedure Laterality Date   IR THORACENTESIS ASP PLEURAL SPACE W/IMG GUIDE  09/13/2018   RIGHT/LEFT HEART CATH AND CORONARY ANGIOGRAPHY N/A 09/15/2018   Procedure: RIGHT/LEFT HEART CATH AND CORONARY ANGIOGRAPHY;  Surgeon: Leonie Man, MD;  Location: [place] [facility];  Service: Cardiovascular;  Laterality: N/A;    History reviewed. No pertinent family history.  Social History   Socioeconomic History   Marital status: Single    Spouse name: Not on file   Number of children: Not on file   Years of education: Not on file   Highest education level: Not on file  Occupational History   Not on file  Tobacco Use   Smoking status: Every Day    Packs/day: 0.50    Types: Cigarettes   Smokeless tobacco: Never  Vaping Use   Vaping Use: Never used   Substance and Sexual Activity   Alcohol use: Yes   Drug use: Never   Sexual activity: Not on file  Other Topics Concern   Not on file  Social History Narrative   Not on file   Social Determinants of Health   Financial Resource Strain: High Risk (10/14/2021)   Overall Financial Resource Strain (CARDIA)    Difficulty of Paying Living Expenses: Hard  Food Insecurity: Food Insecurity Present (10/14/2021)   Hunger Vital Sign    Worried About Running Out of Food in the Last Year: Sometimes true    Ran Out of Food in the Last Year: Sometimes true  Transportation Needs: No Transportation Needs (10/14/2021)   PRAPARE - Hydrologist (Medical): No    Lack of Transportation (Non-Medical): No  Physical Activity: Not on file  Stress: Not on file  Social Connections: Not on file  Intimate Partner Violence: Not on file     Physical Exam   Vitals:   01/31/22 0113  BP: 132/79  Pulse: 78  Resp: 17  Temp: 98.6 F (37 C)  SpO2: 98%    CONSTITUTIONAL: Well-appearing, NAD NEURO/PSYCH:  Alert and oriented x 3, no focal deficits EYES:  eyes equal and reactive ENT/NECK:  no LAD, no JVD CARDIO: Regular rate, well-perfused, normal S1 and S2 PULM:  CTAB no wheezing or rhonchi GI/GU:  non-distended, non-tender MSK/SPINE:  No gross deformities, no edema SKIN:  no rash, atraumatic   *Additional and/or pertinent findings included in MDM below  Diagnostic and Interventional Summary    EKG Interpretation  Date/Time:    Ventricular Rate:    PR Interval:    QRS Duration:   QT Interval:    QTC Calculation:   R Axis:     Text Interpretation:         Labs Reviewed - No data to display  DG Knee Complete 4 Views Right  Final Result      Medications - No data to display   Procedures  /  Critical Care Procedures  ED Course and Medical Decision Making  Initial Impression and Ddx Minimal joint effusion on exam to the right knee, no erythema, no increased  warmth, preserved range of motion, highly doubt septic joint or other emergent process.  Favoring osteoarthritis.  Past medical/surgical history that increases complexity of ED encounter: History of CKD documented but latest creatinine was normal.  Interpretation of Diagnostics I personally reviewed the knee x-ray and my interpretation is as follows: No obvious fracture or bony abnormality    Patient Reassessment and Ultimate Disposition/Management     Appropriate for discharge home.  Patient management required discussion with the following services or consulting groups:  None  Complexity of Problems Addressed Acute complicated illness or Injury  Additional Data Reviewed and Analyzed Further history obtained from: None  Additional Factors Impacting ED Encounter Risk Prescriptions  Barth Kirks. Sedonia Small, MD [place] [email or web address]  Final Clinical Impressions(s) / ED Diagnoses     ICD-10-CM   1. Acute pain of right knee  M25.561       ED Discharge Orders          Ordered    naproxen (NAPROSYN) 500 MG tablet  2 times daily        01/31/22 0351             Discharge Instructions Discussed with and Provided to Patient:    Discharge Instructions      You were evaluated in the Emergency Department and after careful evaluation, we did not find any emergent condition requiring admission or further testing in the hospital.  Your exam/testing [name] is overall reassuring.  X-ray did not show any broken bones or emergencies.  Symptoms are likely due to osteoarthritis of the knee.  Recommend rest, Naprosyn prescription twice daily as needed for pain.  Please return to the Emergency Department if you experience any worsening of your condition.   Thank you for allowing [place] to be a part of your care.      Maudie Flakes, MD 01/31/22 [phone]

## 2022-01-31 NOTE — Discharge Instructions (Signed)
You were evaluated in the Emergency Department and after careful evaluation, we did not find any emergent condition requiring admission or further testing in the hospital.  Your exam/testing today is overall reassuring.  X-ray did not show any broken bones or emergencies.  Symptoms are likely due to osteoarthritis of the knee.  Recommend rest, Naprosyn prescription twice daily as needed for pain.  Please return to the Emergency Department if you experience any worsening of your condition.   Thank you for allowing [place] to be a part of your care.

## 2022-02-07 ENCOUNTER — Other Ambulatory Visit: Payer: Self-pay

## 2022-02-28 ENCOUNTER — Other Ambulatory Visit: Payer: Self-pay

## 2022-02-28 ENCOUNTER — Telehealth: Payer: Self-pay | Admitting: Cardiology

## 2022-02-28 NOTE — Telephone Encounter (Signed)
*  STAT* If patient is at the pharmacy, call can be transferred to refill team.   1. Which medications need to be refilled? (please list name of each medication and dose if known) torsemide (DEMADEX) 20 MG tablet  2. Which pharmacy/location (including street and city if local pharmacy) is medication to be sent to? [facility]  3. Do they need a 30 day or 90 day supply? 90 day  Patient is out of medication.

## 2022-03-02 ENCOUNTER — Inpatient Hospital Stay (HOSPITAL_COMMUNITY)
Admission: EM | Admit: 2022-03-02 | Discharge: 2022-03-08 | DRG: 291 | Disposition: A | Discharge status: Home / Self Care | Payer: Medicaid Other | Attending: Internal Medicine | Admitting: Internal Medicine

## 2022-03-02 ENCOUNTER — Emergency Department (HOSPITAL_COMMUNITY): Payer: Medicaid Other

## 2022-03-02 ENCOUNTER — Encounter (HOSPITAL_COMMUNITY): Payer: Self-pay

## 2022-03-02 DIAGNOSIS — I10 Essential (primary) hypertension: Secondary | ICD-10-CM | POA: Diagnosis present

## 2022-03-02 DIAGNOSIS — T502X5A Adverse effect of carbonic-anhydrase inhibitors, benzothiadiazides and other diuretics, initial encounter: Secondary | ICD-10-CM | POA: Diagnosis not present

## 2022-03-02 DIAGNOSIS — J90 Pleural effusion, not elsewhere classified: Secondary | ICD-10-CM

## 2022-03-02 DIAGNOSIS — E878 Other disorders of electrolyte and fluid balance, not elsewhere classified: Secondary | ICD-10-CM | POA: Diagnosis present

## 2022-03-02 DIAGNOSIS — I5043 Acute on chronic combined systolic (congestive) and diastolic (congestive) heart failure: Secondary | ICD-10-CM | POA: Diagnosis present

## 2022-03-02 DIAGNOSIS — R0789 Other chest pain: Secondary | ICD-10-CM

## 2022-03-02 DIAGNOSIS — I38 Endocarditis, valve unspecified: Secondary | ICD-10-CM | POA: Diagnosis present

## 2022-03-02 DIAGNOSIS — I428 Other cardiomyopathies: Secondary | ICD-10-CM | POA: Diagnosis present

## 2022-03-02 DIAGNOSIS — F17200 Nicotine dependence, unspecified, uncomplicated: Secondary | ICD-10-CM

## 2022-03-02 DIAGNOSIS — I509 Heart failure, unspecified: Secondary | ICD-10-CM

## 2022-03-02 DIAGNOSIS — N1831 Chronic kidney disease, stage 3a: Secondary | ICD-10-CM | POA: Diagnosis present

## 2022-03-02 DIAGNOSIS — I130 Hypertensive heart and chronic kidney disease with heart failure and stage 1 through stage 4 chronic kidney disease, or unspecified chronic kidney disease: Principal | ICD-10-CM | POA: Diagnosis present

## 2022-03-02 DIAGNOSIS — E785 Hyperlipidemia, unspecified: Secondary | ICD-10-CM | POA: Diagnosis present

## 2022-03-02 DIAGNOSIS — I2510 Atherosclerotic heart disease of native coronary artery without angina pectoris: Secondary | ICD-10-CM | POA: Diagnosis present

## 2022-03-02 DIAGNOSIS — M4802 Spinal stenosis, cervical region: Secondary | ICD-10-CM | POA: Diagnosis present

## 2022-03-02 DIAGNOSIS — E876 Hypokalemia: Secondary | ICD-10-CM | POA: Diagnosis not present

## 2022-03-02 DIAGNOSIS — F17210 Nicotine dependence, cigarettes, uncomplicated: Secondary | ICD-10-CM | POA: Diagnosis present

## 2022-03-02 DIAGNOSIS — Z91148 Patient's other noncompliance with medication regimen for other reason: Secondary | ICD-10-CM

## 2022-03-02 DIAGNOSIS — F1410 Cocaine abuse, uncomplicated: Secondary | ICD-10-CM | POA: Diagnosis present

## 2022-03-02 DIAGNOSIS — Z789 Other specified health status: Secondary | ICD-10-CM

## 2022-03-02 DIAGNOSIS — N179 Acute kidney failure, unspecified: Secondary | ICD-10-CM | POA: Diagnosis not present

## 2022-03-02 DIAGNOSIS — R072 Precordial pain: Principal | ICD-10-CM

## 2022-03-02 DIAGNOSIS — Z880 Allergy status to penicillin: Secondary | ICD-10-CM

## 2022-03-02 DIAGNOSIS — F1010 Alcohol abuse, uncomplicated: Secondary | ICD-10-CM | POA: Diagnosis present

## 2022-03-02 LAB — BRAIN NATRIURETIC PEPTIDE: B Natriuretic Peptide: 1850.9 pg/mL — ABNORMAL HIGH (ref 0.0–100.0)

## 2022-03-02 LAB — BASIC METABOLIC PANEL
Anion gap: 10 (ref 5–15)
BUN: 15 mg/dL (ref 6–20)
CO2: 21 mmol/L — ABNORMAL LOW (ref 22–32)
Calcium: 9.1 mg/dL (ref 8.9–10.3)
Chloride: 109 mmol/L (ref 98–111)
Creatinine, Ser: 1.09 mg/dL (ref 0.61–1.24)
GFR, Estimated: >60 mL/min (ref >60)
Glucose, Bld: 96 mg/dL (ref 70–99)
Potassium: 4.2 mmol/L (ref 3.5–5.1)
Sodium: 140 mmol/L (ref 135–145)

## 2022-03-02 LAB — CBC
HCT: 42.7 % (ref 39.0–52.0)
Hemoglobin: 13.6 g/dL (ref 13.0–17.0)
MCH: 28.4 pg (ref 26.0–34.0)
MCHC: 31.9 g/dL (ref 30.0–36.0)
MCV: 89.1 fL (ref 80.0–100.0)
Platelets: 327 10*3/uL (ref 150–400)
RBC: 4.79 MIL/uL (ref 4.22–5.81)
RDW: 15.1 % (ref 11.5–15.5)
WBC: 8 10*3/uL (ref 4.0–10.5)
nRBC: 0.4 % — ABNORMAL HIGH (ref 0.0–0.2)

## 2022-03-02 LAB — TROPONIN I (HIGH SENSITIVITY)
Troponin I (High Sensitivity): 18 ng/L — ABNORMAL HIGH (ref <18)
Troponin I (High Sensitivity): 22 ng/L — ABNORMAL HIGH (ref <18)

## 2022-03-02 MED ORDER — SODIUM CHLORIDE (PF) 0.9 % IJ SOLN
INTRAMUSCULAR | Status: AC
  Filled 2022-03-02: qty 50

## 2022-03-02 MED ORDER — LIDOCAINE 5 % EX PTCH
1.0000 | MEDICATED_PATCH | CUTANEOUS | Status: DC
  Administered 2022-03-02 – 2022-03-03 (×2): 1 via TRANSDERMAL
  Filled 2022-03-02 (×2): qty 1

## 2022-03-02 NOTE — ED Provider Notes (Signed)
[facility] DEPT Provider Note  CSN: [id number] Arrival date & time: 03/02/22 1819  Chief Complaint(s) Chest Pain  HPI Daryl [name] is a 61 y.o. male with PMH CHF with last EF 20 to 25%, HTN, CKD 3 who presents emergency department for evaluation of chest pain and shortness of breath.  Patient states that today he had an episode of chest pain that radiated to his shoulder blades and down his left arm.  He states that he feels that he is having more trouble breathing usual.  He states that he is currently not taking any torsemide at home.  He denies weight gain or lower extremity edema.  Denies abdominal pain, nausea, vomiting, headache, fever or other systemic symptoms.   Past Medical History Past Medical History:  Diagnosis Date   Asthma    Chronic combined systolic and diastolic CHF (congestive heart failure) ([place])    Dyslipidemia    Essential hypertension 11/02/2018   ETOH abuse 02/14/2020   Nonobstructive CAD    Non-obstructive disease on cardiac cath in 08/2018   NSVT (nonsustained ventricular tachycardia) (H[facility])    PNA (pneumonia) 09/11/2018   Sepsis ([place]) 09/11/2018   Stage 3a chronic kidney disease ([place]) 02/19/2020   Tobacco abuse 09/11/2018   Patient Active Problem List   Diagnosis Date Noted   Pleural effusion due to CHF (congestive heart failure) ([place]) 10/02/2021   Acute on chronic systolic heart failure ([place]) 10/01/2021   Cocaine abuse ([place]) 10/01/2021   History of noncompliance with medical treatment 10/01/2021   Elevated LFTs 10/01/2021   Syncope and collapse 08/29/2021   Encounter to establish care 08/15/2021   Alcohol abuse with intoxication delirium ([place])    Alcohol withdrawal delirium ([place]) 06/26/2021   Acute on chronic systolic CHF (congestive heart failure) ([place]) 06/23/2021   Stage 3a chronic kidney disease ([place]) 02/19/2020   ETOH abuse 02/14/2020   Essential hypertension 11/02/2018   Nonischemic cardiomyopathy (H[facility])     Chronic combined systolic and diastolic CHF (congestive heart failure) ([place])    Tobacco abuse 09/11/2018   Home Medication(s) Prior to Admission medications   Medication Sig Start Date End Date Taking? Authorizing Provider  atorvastatin (LIPITOR) 40 MG tablet Take 1 tablet (40 mg total) by mouth daily. 08/15/21 10/14/22  Fenton Foy, NP  dapagliflozin propanediol (FARXIGA) 10 MG TABS tablet Take 1 tablet (10 mg total) by mouth daily before breakfast. 10/14/21   Lyda Jester M, PA-C  metoprolol succinate (TOPROL-XL) 25 MG 24 hr tablet Take 1/2 tablet (12.5 mg total) by mouth daily. 10/08/21   Amin, Jeanella Flattery, MD  naproxen (NAPROSYN) 500 MG tablet Take 1 tablet (500 mg total) by mouth 2 (two) times daily. 01/31/22   Maudie Flakes, MD  potassium chloride SA (KLOR-CON M) 20 MEQ tablet Take 40 mEq by mouth daily.    [provider]  tamsulosin (FLOMAX) 0.4 MG CAPS capsule Take 1 capsule (0.4 mg total) by mouth daily. 10/14/21   Lyda Jester M, PA-C  torsemide (DEMADEX) 20 MG tablet Take 2 tablets (40 mg total) by mouth 2 (two) times daily. 10/14/21 01/12/22  Consuelo Pandy, PA-C  Past Surgical History Past Surgical History:  Procedure Laterality Date   IR THORACENTESIS ASP PLEURAL SPACE W/IMG GUIDE  09/13/2018   RIGHT/LEFT HEART CATH AND CORONARY ANGIOGRAPHY N/A 09/15/2018   Procedure: RIGHT/LEFT HEART CATH AND CORONARY ANGIOGRAPHY;  Surgeon: Leonie Man, MD;  Location: [facility];  Service: Cardiovascular;  Laterality: N/A;   Family History History reviewed. No pertinent family history.  Social History Social History   Tobacco Use   Smoking status: Every Day    Packs/day: 0.50    Types: Cigarettes   Smokeless tobacco: Never  Vaping Use   Vaping Use: Never used  Substance Use Topics   Alcohol use: Yes   Drug use: Never    Allergies Penicillins  Review of Systems Review of Systems  Respiratory:  Positive for shortness of breath.   Cardiovascular:  Positive for chest pain.    Physical Exam Vital Signs  I have reviewed the triage vital signs BP (!) 134/105   Pulse 96   Temp (!) 97.5 F (36.4 C)   Resp 16   SpO2 100%   Physical Exam Vitals and nursing note reviewed.  Constitutional:      General: He is not in acute distress.    Appearance: He is well-developed.  HENT:     Head: Normocephalic and atraumatic.  Eyes:     Conjunctiva/sclera: Conjunctivae normal.  Cardiovascular:     Rate and Rhythm: Normal rate and regular rhythm.     Heart sounds: No murmur heard. Pulmonary:     Effort: Pulmonary effort is normal. No respiratory distress.     Breath sounds: Normal breath sounds.  Abdominal:     Palpations: Abdomen is soft.     Tenderness: There is no abdominal tenderness.  Musculoskeletal:        General: No swelling.     Cervical back: Neck supple.  Skin:    General: Skin is warm and dry.     Capillary Refill: Capillary refill takes less than 2 seconds.  Neurological:     Mental Status: He is alert.  Psychiatric:        Mood and Affect: Mood normal.     ED Results and Treatments Labs (all labs ordered are listed, but only abnormal results are displayed) Labs Reviewed  BASIC METABOLIC PANEL - Abnormal; Notable for the following components:      Result Value   CO2 21 (*)    All other components within normal limits  CBC - Abnormal; Notable for the following components:   nRBC 0.4 (*)    All other components within normal limits  BRAIN NATRIURETIC PEPTIDE - Abnormal; Notable for the following components:   B Natriuretic Peptide 1,850.9 (*)    All other components within normal limits  TROPONIN I (HIGH SENSITIVITY) - Abnormal; Notable for the following components:   Troponin I (High Sensitivity) 18 (*)    All other components within normal limits  TROPONIN I (HIGH  SENSITIVITY) - Abnormal; Notable for the following components:   Troponin I (High Sensitivity) 22 (*)    All other components within normal limits  Radiology DG Chest 2 View  Result Date: 03/02/2022 CLINICAL DATA:  Chest pain EXAM: CHEST - 2 VIEW COMPARISON:  11/09/2021 FINDINGS: Cardiomegaly. Unchanged, moderate right pleural effusion associated atelectasis or consolidation. The left lung is normally aerated. Osseous structures unremarkable. IMPRESSION: 1. Cardiomegaly. 2. Unchanged, moderate right pleural effusion and associated atelectasis or consolidation. 3. The left lung is normally aerated. Electronically Signed   By: Jearld Lesch M.D.   On: 03/02/2022 19:01    Pertinent labs & imaging results that were available during my care of the patient were reviewed by me and considered in my medical decision making (see MDM for details).  Medications Ordered in ED Medications  lidocaine (LIDODERM) 5 % 1 patch (1 patch Transdermal Patch Applied 03/02/22 2242)                                                                                                                                     Procedures Procedures  (including critical care time)  Medical Decision Making / ED Course   This patient presents to the ED for concern of chest pain, shortness of breath, this involves an extensive number of treatment options, and is a complaint that carries with it a high risk of complications and morbidity.  The differential diagnosis includes ACS, PE, pneumonia, musculoskeletal pain, pleural effusion  MDM: Patient seen emergency room for evaluation of chest pain shortness of breath.  Physical exam with decreased breath sounds on the right with rales but is otherwise unremarkable.  Laboratory evaluation unremarkable outside of an initial high-sensitivity troponin of 18 but delta troponin  22.  BNP is elevated to 1850 but this is downtrending from previous.  Chest x-ray with a stable but moderate size right-sided pleural effusion and on evaluation of previous presentations where this chest x-ray was performed, it appears that nobody has really addressed this pleural effusion given that the patient has left AMA and has been noncompliant with his medications.  Given recurrent chest pain, CT PE was obtained and is currently pending.  Patient then signed out to oncoming provider.  Please see provider signout for continuation of work-up.  Anticipate admission for moderate risk chest pain.   Additional history obtained:  -External records from outside source obtained and reviewed including: Chart review including previous notes, labs, imaging, consultation notes   Lab Tests: -I ordered, reviewed, and interpreted labs.   The pertinent results include:   Labs Reviewed  BASIC METABOLIC PANEL - Abnormal; Notable for the following components:      Result Value   CO2 21 (*)    All other components within normal limits  CBC - Abnormal; Notable for the following components:   nRBC 0.4 (*)    All other components within normal limits  BRAIN NATRIURETIC PEPTIDE - Abnormal; Notable for the following components:   B Natriuretic Peptide 1,850.9 (*)    All other components within normal limits  TROPONIN I (  HIGH SENSITIVITY) - Abnormal; Notable for the following components:   Troponin I (High Sensitivity) 18 (*)    All other components within normal limits  TROPONIN I (HIGH SENSITIVITY) - Abnormal; Notable for the following components:   Troponin I (High Sensitivity) 22 (*)    All other components within normal limits      EKG   EKG Interpretation  Date/Time:  Sunday March 02 2022 18:44:27 EDT Ventricular Rate:  94 PR Interval:  158 QRS Duration: 87 QT Interval:  389 QTC Calculation: 487 R Axis:   21 Text Interpretation: Sinus rhythm Probable left atrial enlargement No significant  change since last tracing Confirmed by Karyl Sharrar ([id number]) on 03/02/2022 8:37:26 PM         Imaging Studies ordered: I ordered imaging studies including chest x-ray I independently visualized and interpreted imaging. I agree with the radiologist interpretation   Medicines ordered and prescription drug management: Meds ordered this encounter  Medications   lidocaine (LIDODERM) 5 % 1 patch    -I have reviewed the patients home medicines and have made adjustments as needed  Critical interventions none    Cardiac Monitoring: The patient was maintained on a cardiac monitor.  I personally viewed and interpreted the cardiac monitored which showed an underlying rhythm of: NSR  Social Determinants of Health:  Factors impacting patients care include: NONE   Reevaluation: After the interventions noted above, I reevaluated the patient and found that they have :stayed the same  Co morbidities that complicate the patient evaluation  Past Medical History:  Diagnosis Date   Asthma    Chronic combined systolic and diastolic CHF (congestive heart failure) (HCC)    Dyslipidemia    Essential hypertension 11/02/2018   ETOH abuse 02/14/2020   Nonobstructive CAD    Non-obstructive disease on cardiac cath in 08/2018   NSVT (nonsustained ventricular tachycardia) (HCC)    PNA (pneumonia) 09/11/2018   Sepsis (HCC) 09/11/2018   Stage 3a chronic kidney disease (HCC) 02/19/2020   Tobacco abuse 09/11/2018      Dispostion: I considered admission for this patient, and disposition pending at this time.  Please see provider signout for continuation of work-up     Final Clinical Impression(s) / ED Diagnoses Final diagnoses:  None     @PCDICTATION @    , MD 03/02/22 2358

## 2022-03-02 NOTE — ED Provider Triage Note (Signed)
Emergency Medicine Provider Triage Evaluation Note  Daryl [name] , a 61 y.o. male  was evaluated in triage.  Pt complains of sob. Began a few days ago. Pain to left arm into back. Hx of CHF, feels like he cannot cough  Review of Systems  Positive: Sob, cp Negative:   Physical Exam  BP (!) 150/108 (BP Location: Right Arm)   Pulse 92   Temp (!) 97.4 F (36.3 C) (Oral)   Resp 14   SpO2 96%  Gen:   Awake, no distress   Resp:  Normal effort  MSK:   Moves extremities without difficulty  Other:    Medical Decision Making  Medically screening exam initiated at 6:56 PM.  Appropriate orders placed.  [name] was informed that the remainder of the evaluation will be completed by another provider, this initial triage assessment does not replace that evaluation, and the importance of remaining in the ED until their evaluation is complete.  Sob, cp   Rondey Fallen A, PA-C 03/02/22 1857

## 2022-03-02 NOTE — ED Triage Notes (Signed)
Pt presents with c/o chest pain, left arm pain, and back pain for 2 days. Pt reports he also feels "tightness" in his stomach.

## 2022-03-03 ENCOUNTER — Emergency Department (HOSPITAL_COMMUNITY): Payer: Medicaid Other

## 2022-03-03 ENCOUNTER — Other Ambulatory Visit: Payer: Self-pay

## 2022-03-03 ENCOUNTER — Encounter (HOSPITAL_COMMUNITY): Payer: Self-pay | Admitting: Family Medicine

## 2022-03-03 ENCOUNTER — Observation Stay (HOSPITAL_COMMUNITY): Payer: Medicaid Other

## 2022-03-03 ENCOUNTER — Other Ambulatory Visit (HOSPITAL_COMMUNITY): Payer: Self-pay | Admitting: Cardiology

## 2022-03-03 ENCOUNTER — Other Ambulatory Visit (HOSPITAL_COMMUNITY): Payer: Self-pay

## 2022-03-03 DIAGNOSIS — I5043 Acute on chronic combined systolic (congestive) and diastolic (congestive) heart failure: Secondary | ICD-10-CM | POA: Diagnosis not present

## 2022-03-03 DIAGNOSIS — I509 Heart failure, unspecified: Secondary | ICD-10-CM

## 2022-03-03 DIAGNOSIS — Z789 Other specified health status: Secondary | ICD-10-CM | POA: Diagnosis not present

## 2022-03-03 DIAGNOSIS — F17200 Nicotine dependence, unspecified, uncomplicated: Secondary | ICD-10-CM

## 2022-03-03 DIAGNOSIS — E876 Hypokalemia: Secondary | ICD-10-CM | POA: Diagnosis not present

## 2022-03-03 DIAGNOSIS — R0789 Other chest pain: Secondary | ICD-10-CM | POA: Diagnosis not present

## 2022-03-03 DIAGNOSIS — I10 Essential (primary) hypertension: Secondary | ICD-10-CM

## 2022-03-03 DIAGNOSIS — I38 Endocarditis, valve unspecified: Secondary | ICD-10-CM

## 2022-03-03 LAB — LACTATE DEHYDROGENASE, PLEURAL OR PERITONEAL FLUID: LD, Fluid: 83 U/L — ABNORMAL HIGH (ref 3–23)

## 2022-03-03 LAB — HEPATIC FUNCTION PANEL
ALT: 54 U/L — ABNORMAL HIGH (ref 0–44)
AST: 45 U/L — ABNORMAL HIGH (ref 15–41)
Albumin: 3.8 g/dL (ref 3.5–5.0)
Alkaline Phosphatase: 128 U/L — ABNORMAL HIGH (ref 38–126)
Bilirubin, Direct: 0.3 mg/dL — ABNORMAL HIGH (ref 0.0–0.2)
Indirect Bilirubin: 1.5 mg/dL — ABNORMAL HIGH (ref 0.3–0.9)
Total Bilirubin: 1.8 mg/dL — ABNORMAL HIGH (ref 0.3–1.2)
Total Protein: 6.7 g/dL (ref 6.5–8.1)

## 2022-03-03 LAB — BODY FLUID CELL COUNT WITH DIFFERENTIAL
Eos, Fluid: 0 %
Lymphs, Fluid: 67 %
Monocyte-Macrophage-Serous Fluid: 22 % — ABNORMAL LOW (ref 50–90)
Neutrophil Count, Fluid: 11 % (ref 0–25)
Total Nucleated Cell Count, Fluid: 2132 cu mm — ABNORMAL HIGH (ref 0–1000)

## 2022-03-03 LAB — GRAM STAIN

## 2022-03-03 LAB — CBC
HCT: 39.3 % (ref 39.0–52.0)
Hemoglobin: 12.7 g/dL — ABNORMAL LOW (ref 13.0–17.0)
MCH: 28.5 pg (ref 26.0–34.0)
MCHC: 32.3 g/dL (ref 30.0–36.0)
MCV: 88.3 fL (ref 80.0–100.0)
Platelets: 331 10*3/uL (ref 150–400)
RBC: 4.45 MIL/uL (ref 4.22–5.81)
RDW: 14.8 % (ref 11.5–15.5)
WBC: 7.4 10*3/uL (ref 4.0–10.5)
nRBC: 0.4 % — ABNORMAL HIGH (ref 0.0–0.2)

## 2022-03-03 LAB — BASIC METABOLIC PANEL
Anion gap: 11 (ref 5–15)
BUN: 13 mg/dL (ref 6–20)
CO2: 21 mmol/L — ABNORMAL LOW (ref 22–32)
Calcium: 8.8 mg/dL — ABNORMAL LOW (ref 8.9–10.3)
Chloride: 107 mmol/L (ref 98–111)
Creatinine, Ser: 0.98 mg/dL (ref 0.61–1.24)
GFR, Estimated: >60 mL/min (ref >60)
Glucose, Bld: 152 mg/dL — ABNORMAL HIGH (ref 70–99)
Potassium: 3.2 mmol/L — ABNORMAL LOW (ref 3.5–5.1)
Sodium: 139 mmol/L (ref 135–145)

## 2022-03-03 LAB — MAGNESIUM: Magnesium: 1.7 mg/dL (ref 1.7–2.4)

## 2022-03-03 LAB — PROTEIN, PLEURAL OR PERITONEAL FLUID: Total protein, fluid: <3 g/dL

## 2022-03-03 LAB — LACTATE DEHYDROGENASE: LDH: 184 U/L (ref 98–192)

## 2022-03-03 MED ORDER — IOHEXOL 350 MG/ML SOLN
100.0000 mL | Freq: Once | INTRAVENOUS | Status: AC | PRN
  Administered 2022-03-03: 100 mL via INTRAVENOUS

## 2022-03-03 MED ORDER — POTASSIUM CHLORIDE CRYS ER 20 MEQ PO TBCR
40.0000 meq | EXTENDED_RELEASE_TABLET | ORAL | Status: AC
  Administered 2022-03-03 (×2): 40 meq via ORAL
  Filled 2022-03-03 (×2): qty 2

## 2022-03-03 MED ORDER — DAPAGLIFLOZIN PROPANEDIOL 10 MG PO TABS
10.0000 mg | ORAL_TABLET | Freq: Every day | ORAL | Status: DC
  Administered 2022-03-03 – 2022-03-08 (×6): 10 mg via ORAL
  Filled 2022-03-03 (×6): qty 1

## 2022-03-03 MED ORDER — MAGNESIUM SULFATE 2 GM/50ML IV SOLN
2.0000 g | Freq: Once | INTRAVENOUS | Status: AC
  Administered 2022-03-03: 2 g via INTRAVENOUS
  Filled 2022-03-03: qty 50

## 2022-03-03 MED ORDER — METOPROLOL SUCCINATE ER 25 MG PO TB24
12.5000 mg | ORAL_TABLET | Freq: Every day | ORAL | Status: DC
  Administered 2022-03-03 – 2022-03-05 (×3): 12.5 mg via ORAL
  Filled 2022-03-03: qty 0.5
  Filled 2022-03-03 (×2): qty 1

## 2022-03-03 MED ORDER — METOPROLOL SUCCINATE ER 25 MG PO TB24
12.5000 mg | ORAL_TABLET | Freq: Every day | ORAL | 0 refills | Status: DC
Start: 2022-03-03 — End: 2022-03-07
  Filled 2022-03-03: qty 45, 90d supply, fill #0

## 2022-03-03 MED ORDER — LIDOCAINE HCL 1 % IJ SOLN
INTRAMUSCULAR | Status: AC
  Administered 2022-03-03: 15 mL
  Filled 2022-03-03: qty 20

## 2022-03-03 MED ORDER — ORAL CARE MOUTH RINSE: 15.0000 mL | OROMUCOSAL | Status: DC | PRN

## 2022-03-03 MED ORDER — ATORVASTATIN CALCIUM 40 MG PO TABS
40.0000 mg | ORAL_TABLET | Freq: Every day | ORAL | 0 refills | Status: DC
  Filled 2022-03-03: qty 90, 90d supply, fill #0

## 2022-03-03 MED ORDER — GABAPENTIN 300 MG PO CAPS
300.0000 mg | ORAL_CAPSULE | Freq: Three times a day (TID) | ORAL | Status: DC
  Administered 2022-03-03 – 2022-03-08 (×15): 300 mg via ORAL
  Filled 2022-03-03 (×15): qty 1

## 2022-03-03 MED ORDER — DAPAGLIFLOZIN PROPANEDIOL 10 MG PO TABS
10.0000 mg | ORAL_TABLET | Freq: Every day | ORAL | 0 refills | Status: DC
  Filled 2022-03-03: qty 90, 90d supply, fill #0

## 2022-03-03 MED ORDER — OXYCODONE HCL 5 MG PO TABS
5.0000 mg | ORAL_TABLET | ORAL | Status: AC | PRN
  Administered 2022-03-03 (×2): 5 mg via ORAL
  Filled 2022-03-03 (×2): qty 1

## 2022-03-03 MED ORDER — POTASSIUM CHLORIDE CRYS ER 20 MEQ PO TBCR
40.0000 meq | EXTENDED_RELEASE_TABLET | Freq: Every day | ORAL | 0 refills | Status: DC
  Filled 2022-03-03: qty 180, 90d supply, fill #0

## 2022-03-03 MED ORDER — SENNOSIDES-DOCUSATE SODIUM 8.6-50 MG PO TABS: 1.0000 | ORAL_TABLET | Freq: Every evening | ORAL | Status: DC | PRN

## 2022-03-03 MED ORDER — FUROSEMIDE 10 MG/ML IJ SOLN
60.0000 mg | Freq: Once | INTRAMUSCULAR | Status: AC
  Administered 2022-03-03: 60 mg via INTRAVENOUS
  Filled 2022-03-03: qty 8

## 2022-03-03 MED ORDER — ENOXAPARIN SODIUM 40 MG/0.4ML IJ SOSY
40.0000 mg | PREFILLED_SYRINGE | INTRAMUSCULAR | Status: DC
  Administered 2022-03-03 – 2022-03-08 (×6): 40 mg via SUBCUTANEOUS
  Filled 2022-03-03 (×6): qty 0.4

## 2022-03-03 MED ORDER — TAMSULOSIN HCL 0.4 MG PO CAPS
0.4000 mg | ORAL_CAPSULE | Freq: Every day | ORAL | Status: DC
  Administered 2022-03-03 – 2022-03-08 (×6): 0.4 mg via ORAL
  Filled 2022-03-03 (×6): qty 1

## 2022-03-03 MED ORDER — ACETAMINOPHEN 650 MG RE SUPP: 650.0000 mg | Freq: Four times a day (QID) | RECTAL | Status: DC | PRN

## 2022-03-03 MED ORDER — ACETAMINOPHEN 325 MG PO TABS
650.0000 mg | ORAL_TABLET | Freq: Four times a day (QID) | ORAL | Status: DC | PRN
  Administered 2022-03-04: 650 mg via ORAL
  Filled 2022-03-03: qty 2

## 2022-03-03 MED ORDER — FUROSEMIDE 10 MG/ML IJ SOLN
40.0000 mg | Freq: Two times a day (BID) | INTRAMUSCULAR | Status: DC
  Administered 2022-03-03 – 2022-03-04 (×3): 40 mg via INTRAVENOUS
  Filled 2022-03-03 (×3): qty 4

## 2022-03-03 MED ORDER — SODIUM CHLORIDE 0.9% FLUSH
3.0000 mL | Freq: Two times a day (BID) | INTRAVENOUS | Status: DC
  Administered 2022-03-03 – 2022-03-07 (×11): 3 mL via INTRAVENOUS

## 2022-03-03 MED ORDER — ATORVASTATIN CALCIUM 40 MG PO TABS
40.0000 mg | ORAL_TABLET | Freq: Every day | ORAL | Status: DC
  Administered 2022-03-03 – 2022-03-08 (×6): 40 mg via ORAL
  Filled 2022-03-03 (×6): qty 1

## 2022-03-03 MED ORDER — POTASSIUM CHLORIDE CRYS ER 20 MEQ PO TBCR: 40.0000 meq | EXTENDED_RELEASE_TABLET | ORAL | Status: DC

## 2022-03-03 MED ORDER — TORSEMIDE 20 MG PO TABS
40.0000 mg | ORAL_TABLET | Freq: Two times a day (BID) | ORAL | 0 refills | Status: DC
  Filled 2022-03-03: qty 360, 90d supply, fill #0

## 2022-03-03 MED ORDER — TAMSULOSIN HCL 0.4 MG PO CAPS
0.4000 mg | ORAL_CAPSULE | Freq: Every day | ORAL | 1 refills | Status: DC
  Filled 2022-03-03: qty 30, 30d supply, fill #0

## 2022-03-03 NOTE — Progress Notes (Addendum)
PROGRESS NOTE  Daryl [name] [id number] DOB: 1961-08-24   PCP: Patient, No Pcp Per  Patient is from: Home.  Lives by himself.  DOA: 03/02/2022 LOS: 0  Chief complaints Chief Complaint  Patient presents with   Chest Pain     Brief Narrative / Interim history: 61 year old M with PMH of combined CHF, HTN, alcohol use, tobacco use, prior cocaine use and noncompliance presenting with 2 days of left-sided chest pain and upper back pain, shortness of breath, and admitted for acute on chronic combined CHF and right moderate pleural effusion.   In ED, slightly elevated BP.  Normal saturation on RA.  CMP and CBC without significant finding other than mildly elevated bilirubin.  BNP 1900 (lower than baseline).  Troponin 18>> 22.  CTA chest negative for PE but moderate right pleural effusion.  Patient was started on IV Lasix.  Thoracocentesis ordered.   Subjective: Seen and examined earlier this morning.  Continues to endorse left upper back pain which is worse with movement.  No further chest pain.  Denies GI or UTI symptoms.  Objective: Vitals:   03/03/22 0916 03/03/22 1100 03/03/22 1202 03/03/22 1230  BP: 133/76 125/89 99/73 116/85  Pulse: 98  81 71  Resp:   18 18  Temp:      TempSrc:      SpO2:   98% 98%  Weight:      Height:        Examination:  GENERAL: No apparent distress.  Nontoxic. HEENT: MMM.  Vision and hearing grossly intact.  NECK: Supple.  Notable JVD.  RESP:  No IWOB.  Fair aeration bilaterally.  Bibasilar crackles. CVS:  RRR.  2/6 SEM over LLSB and apex. ABD/GI/GU: BS+. Abd soft, NTND.  MSK/EXT:  Moves extremities. No apparent deformity. No edema.  FROM.  No focal tenderness. SKIN: no apparent skin lesion or wound NEURO: Awake, alert and oriented appropriately.  No apparent focal neuro deficit. PSYCH: Calm. Normal affect.   Procedures:  None  Microbiology summarized: None  Assessment and plan: Principal Problem:   Acute on chronic combined systolic and  diastolic CHF (congestive heart failure) ([facility]) Active Problems:   Tobacco use disorder   Essential hypertension   Pleural effusion due to CHF (congestive heart failure) ([facility])   Left-sided chest wall pain   Hypokalemia   Alcohol use   Acute on chronic combined CHF: TTE in 09/2021 with LVEF of 20 to 25%, GH, G3 DD, severe LAE, severe RAE, moderate to severe MVR, severe TVR and mild RVSF.  Presents with left-sided chest pain and back pain as well as shortness of breath.  Has bibasilar crackles and notable JVD but no edema.  BNP elevated to 1900 but chronic.  He has moderate right-sided pleural effusion.  Noncompliant with diuretics and medications.  Started on IV Lasix.  About 1.4 L UOP overnight. -Continue IV Lasix -Update echocardiogram.  Cardiology consult based on echocardiogram result -Continue home Farxiga, Toprol-XL -Monitor intake and output, renal functions and electrolytes. -IR consulted for thoracocentesis.  Valvular heart disease: Previous TTE with severe TVR, moderate to severe MVR.. -Update echocardiogram.  Moderate right-sided pleural effusion: Likely due to CHF. -Diuretics and thoracocentesis  Musculoskeletal left chest/upper back pain -Lidoderm patch and pain medications  Hypokalemia/hypomagnesemia -P.o. KCl 40x2 -IV magnesium sulfate 2 g x 1  Tobacco use disorder -Encourage smoking cessation.  Alcohol use -Encouraged cessation.  Noncompliance -Counseled. -TOC consulted   Body mass index is 23.64 kg/m.  DVT prophylaxis:  enoxaparin (LOVENOX) injection 40 mg Start: 03/03/22 1000  Code Status: Full code Family Communication: None at bedside Level of care: Telemetry Status is: Observation The patient will require care spanning > 2 midnights and should be moved to inpatient because: Acute on chronic combined CHF requiring IV diuretics and thoracocentesis   Final disposition: Likely home in the next 24 to 48 hours Consultants:   Interventional radiology  Sch Meds:  Scheduled Meds:  atorvastatin  40 mg Oral Daily   dapagliflozin propanediol  10 mg Oral QAC breakfast   enoxaparin (LOVENOX) injection  40 mg Subcutaneous Q24H   furosemide  40 mg Intravenous Q12H   lidocaine  1 patch Transdermal Q24H   metoprolol succinate  12.5 mg Oral Daily   sodium chloride flush  3 mL Intravenous Q12H   tamsulosin  0.4 mg Oral Daily   Continuous Infusions: PRN Meds:.acetaminophen **OR** acetaminophen, senna-docusate  Antimicrobials: Anti-infectives (From admission, onward)    None        I have personally reviewed the following labs and images: CBC: Recent Labs  Lab 03/02/22 2018 03/03/22 0414  WBC 8.0 7.4  HGB 13.6 12.7*  HCT 42.7 39.3  MCV 89.1 88.3  PLT 327 331   BMP &GFR Recent Labs  Lab 03/02/22 2018 03/03/22 0414  NA 140 139  K 4.2 3.2*  CL 109 107  CO2 21* 21*  GLUCOSE 96 152*  BUN 15 13  CREATININE 1.09 0.98  CALCIUM 9.1 8.8*  MG  --  1.7   Estimated Creatinine Clearance: 80.2 mL/min (by C-G formula based on SCr of 0.98 mg/dL). Liver & Pancreas: Recent Labs  Lab 03/03/22 0414  AST 45*  ALT 54*  ALKPHOS 128*  BILITOT 1.8*  PROT 6.7  ALBUMIN 3.8   No results for input(s): "LIPASE", "AMYLASE" in the last 168 hours. No results for input(s): "AMMONIA" in the last 168 hours. Diabetic: No results for input(s): "HGBA1C" in the last 72 hours. No results for input(s): "GLUCAP" in the last 168 hours. Cardiac Enzymes: No results for input(s): "CKTOTAL", "CKMB", "CKMBINDEX", "TROPONINI" in the last 168 hours. No results for input(s): "PROBNP" in the last 8760 hours. Coagulation Profile: No results for input(s): "INR", "PROTIME" in the last 168 hours. Thyroid Function Tests: No results for input(s): "TSH", "T4TOTAL", "FREET4", "T3FREE", "THYROIDAB" in the last 72 hours. Lipid Profile: No results for input(s): "CHOL", "HDL", "LDLCALC", "TRIG", "CHOLHDL", "LDLDIRECT" in the last 72  hours. Anemia Panel: No results for input(s): "VITAMINB12", "FOLATE", "FERRITIN", "TIBC", "IRON", "RETICCTPCT" in the last 72 hours. Urine analysis:    Component Value Date/Time   COLORURINE STRAW (A) 10/01/2021 0330   APPEARANCEUR CLEAR 10/01/2021 0330   LABSPEC 1.005 10/01/2021 0330   PHURINE 6.0 10/01/2021 0330   GLUCOSEU NEGATIVE 10/01/2021 0330   HGBUR NEGATIVE 10/01/2021 0330   BILIRUBINUR NEGATIVE 10/01/2021 0330   KETONESUR NEGATIVE 10/01/2021 0330   PROTEINUR NEGATIVE 10/01/2021 0330   NITRITE NEGATIVE 10/01/2021 0330   LEUKOCYTESUR NEGATIVE 10/01/2021 0330   Sepsis Labs: Invalid input(s): "PROCALCITONIN", "LACTICIDVEN"  Microbiology: No results found for this or any previous visit (from the past 240 hour(s)).  Radiology Studies: CT Angio Chest PE W and/or Wo Contrast  Result Date: 03/03/2022 CLINICAL DATA:  Pulmonary embolism suspected, high probability. Chest back and left arm pain. EXAM: CT ANGIOGRAPHY CHEST WITH CONTRAST TECHNIQUE: Multidetector CT imaging of the chest was performed using the standard protocol during bolus administration of intravenous contrast. Multiplanar CT image reconstructions and MIPs were obtained  to evaluate the vascular anatomy. RADIATION DOSE REDUCTION: This exam was performed according to the departmental dose-optimization program which includes automated exposure control, adjustment of the mA and/or kV according to patient size and/or use of iterative reconstruction technique. CONTRAST:  OMNIPAQUE IOHEXOL 350 MG/ML SOLN COMPARISON:  09/13/2018. FINDINGS: Cardiovascular: The heart is enlarged and there is no pericardial effusion. Mild coronary artery calcifications are noted. There is mild atherosclerotic calcification of the aorta without evidence of aneurysm. The pulmonary trunk is normal in caliber. No large central pulmonary artery filling defect is identified. Evaluation of the segmental and subsegmental arteries is limited due to  significant respiratory motion artifact. Mediastinum/Nodes: No enlarged mediastinal, hilar, or axillary lymph nodes. Thyroid gland, trachea, and esophagus demonstrate no significant findings. Lungs/Pleura: Mild emphysematous changes are noted at the lung apices. Mild atelectasis is present bilaterally. There is a moderate right pleural effusion and trace left pleural effusion. No pneumothorax. Upper Abdomen: Reflux of contrast into the hepatic veins and inferior vena cava may be associated with right heart failure. No acute abnormality. Musculoskeletal: Bilateral gynecomastia is noted. No acute osseous abnormality. Review of the MIP images confirms the above findings. IMPRESSION: 1. No central pulmonary artery filling defect. Evaluation of the segmental and subsegmental arteries is limited due to respiratory motion artifact. 2. Moderate right pleural effusion and trace pleural effusion with atelectasis bilaterally. 3. Cardiomegaly with coronary artery calcifications. 4. Reflux of contrast into the hepatic veins and inferior vena cava may be associated with right heart failure. 5. Minimal aortic atherosclerosis. Electronically Signed   By: Thornell Sartorius M.D.   On: 03/03/2022 00:57   DG Chest 2 View  Result Date: 03/02/2022 CLINICAL DATA:  Chest pain EXAM: CHEST - 2 VIEW COMPARISON:  11/09/2021 FINDINGS: Cardiomegaly. Unchanged, moderate right pleural effusion associated atelectasis or consolidation. The left lung is normally aerated. Osseous structures unremarkable. IMPRESSION: 1. Cardiomegaly. 2. Unchanged, moderate right pleural effusion and associated atelectasis or consolidation. 3. The left lung is normally aerated. Electronically Signed   By: Jearld Lesch M.D.   On: 03/02/2022 19:01      Loyalty Brashier T. Bridger Pizzi [facility]  If 7PM-7AM, please contact night-coverage [email or web address] 03/03/2022, 2:05 PM

## 2022-03-03 NOTE — H&P (Signed)
History and Physical    Daryl [name] NID:[id number] DOB: 31-Jul-1961 DOA: 03/02/2022  PCP: Patient, No Pcp Per   Patient coming from: Home   Chief Complaint: Chest pain, back pain, SOB   HPI: Daryl [name] is a pleasant 61 y.o. male with medical history significant for alcohol abuse in remission, cocaine abuse, hypertension, blind systolic and diastolic CHF, and medication noncompliance, now presenting to the emergency department with 2 days of chest pain, back pain, and shortness of breath.  Patient reports that he was getting ready for bed 2 nights ago when he developed aching pain in the left upper chest and left upper back with some radiation down to his fingers.  He has continued to have this pain intermittently, worse with certain arm movements.  He also reports progressively worsening dyspnea over more than a week, but without cough, fever, or chills.  He denies any leg swelling.  He has been out of all of his medications for 2 to 3 months.  Denies any recent alcohol use.  ED Course: Upon arrival to the ED, patient is found to be afebrile and saturating 90s on room air with stable blood pressure.  EKG features sinus rhythm and chest x-ray notable for cardiomegaly and right pleural effusion.  CTA chest is a limited study that is negative for central PE but notable for moderate right and trace left pleural effusions.  Patient was given 60 mg IV Lasix in the ED.  Review of Systems:  All other systems reviewed and apart from HPI, are negative.  Past Medical History:  Diagnosis Date   Asthma    Chronic combined systolic and diastolic CHF (congestive heart failure) (HCC)    Dyslipidemia    Essential hypertension 11/02/2018   ETOH abuse 02/14/2020   Nonobstructive CAD    Non-obstructive disease on cardiac cath in 08/2018   NSVT (nonsustained ventricular tachycardia) (HCC)    PNA (pneumonia) 09/11/2018   Sepsis (HCC) 09/11/2018   Stage 3a chronic kidney disease (HCC) 02/19/2020   Tobacco  abuse 09/11/2018    Past Surgical History:  Procedure Laterality Date   IR THORACENTESIS ASP PLEURAL SPACE W/IMG GUIDE  09/13/2018   RIGHT/LEFT HEART CATH AND CORONARY ANGIOGRAPHY N/A 09/15/2018   Procedure: RIGHT/LEFT HEART CATH AND CORONARY ANGIOGRAPHY;  Surgeon: Marykay Lex, MD;  Location: [facility] INVASIVE CV LAB;  Service: Cardiovascular;  Laterality: N/A;    Social History:   reports that he has been smoking cigarettes. He has been smoking an average of .5 packs per day. He has never used smokeless tobacco. He reports current alcohol use. He reports that he does not use drugs.  Allergies  Allergen Reactions   Penicillins Swelling    Did it involve swelling of the face/tongue/throat, SOB, or low BP? Yes Did it involve sudden or severe rash/hives, skin peeling, or any reaction on the inside of your mouth or nose? No Did you need to seek medical attention at a hospital or doctor's office? Yes When did it last happen?    young adult   If all above answers are "NO", may proceed with cephalosporin use.     History reviewed. No pertinent family history.   Prior to Admission medications   Medication Sig Start Date End Date Taking? Authorizing Provider  atorvastatin (LIPITOR) 40 MG tablet Take 1 tablet (40 mg total) by mouth daily. Patient not taking: Reported on 03/03/2022 08/15/21 10/14/22  Ivonne Andrew, NP  dapagliflozin propanediol (FARXIGA) 10 MG TABS tablet Take 1  tablet (10 mg total) by mouth daily before breakfast. Patient not taking: Reported on 03/03/2022 10/14/21   Lyda Jester M, PA-C  metoprolol succinate (TOPROL-XL) 25 MG 24 hr tablet Take 1/2 tablet (12.5 mg total) by mouth daily. Patient not taking: Reported on 03/03/2022 10/08/21   Damita Lack, MD  naproxen (NAPROSYN) 500 MG tablet Take 1 tablet (500 mg total) by mouth 2 (two) times daily. Patient not taking: Reported on 03/03/2022 01/31/22   Maudie Flakes, MD  potassium chloride SA (KLOR-CON M) 20 MEQ tablet  Take 40 mEq by mouth daily. Patient not taking: Reported on 03/03/2022    [provider]  tamsulosin (FLOMAX) 0.4 MG CAPS capsule Take 1 capsule (0.4 mg total) by mouth daily. Patient not taking: Reported on 03/03/2022 10/14/21   Lyda Jester M, PA-C  torsemide (DEMADEX) 20 MG tablet Take 2 tablets (40 mg total) by mouth 2 (two) times daily. Patient not taking: Reported on 03/03/2022 10/14/21 01/12/22  Consuelo Pandy, PA-C    Physical Exam: Vitals:   03/03/22 0130 03/03/22 0145 03/03/22 0300 03/03/22 0405  BP: 122/87     Pulse: (!) 104  93   Resp:  (!) 22 20   Temp:      TempSrc:      SpO2: 95%  95%   Weight:    72.6 kg  Height:    5\' 9"  (1.753 m)    Constitutional: NAD, calm  Eyes: PERTLA, lids and conjunctivae normal ENMT: Mucous membranes are moist. Posterior pharynx clear of any exudate or lesions.   Neck: supple, no masses  Respiratory: no wheezing, no crackles. No accessory muscle use.  Cardiovascular: S1 & S2 heard, regular rate and rhythm. No extremity edema.  Abdomen: No distension, no tenderness, soft. Bowel sounds active.  Musculoskeletal: no clubbing / cyanosis. Tender left upper back and chest.   Skin: no significant rashes, lesions, ulcers. Warm, dry, well-perfused. Neurologic: CN 2-12 grossly intact. Moving all extremities. Alert and oriented.  Psychiatric: Calm. Cooperative.    Labs and Imaging on Admission: I have personally reviewed following labs and imaging studies  CBC: Recent Labs  Lab 03/02/22 2018  WBC 8.0  HGB 13.6  HCT 42.7  MCV 89.1  PLT [id number]   Basic Metabolic Panel: Recent Labs  Lab 03/02/22 2018  NA 140  K 4.2  CL 109  CO2 21*  GLUCOSE 96  BUN 15  CREATININE 1.09  CALCIUM 9.1   GFR: Estimated Creatinine Clearance: 72.1 mL/min (by C-G formula based on SCr of 1.09 mg/dL). Liver Function Tests: No results for input(s): "AST", "ALT", "ALKPHOS", "BILITOT", "PROT", "ALBUMIN" in the last 168 hours. No results for  input(s): "LIPASE", "AMYLASE" in the last 168 hours. No results for input(s): "AMMONIA" in the last 168 hours. Coagulation Profile: No results for input(s): "INR", "PROTIME" in the last 168 hours. Cardiac Enzymes: No results for input(s): "CKTOTAL", "CKMB", "CKMBINDEX", "TROPONINI" in the last 168 hours. BNP (last 3 results) No results for input(s): "PROBNP" in the last 8760 hours. HbA1C: No results for input(s): "HGBA1C" in the last 72 hours. CBG: No results for input(s): "GLUCAP" in the last 168 hours. Lipid Profile: No results for input(s): "CHOL", "HDL", "LDLCALC", "TRIG", "CHOLHDL", "LDLDIRECT" in the last 72 hours. Thyroid Function Tests: No results for input(s): "TSH", "T4TOTAL", "FREET4", "T3FREE", "THYROIDAB" in the last 72 hours. Anemia Panel: No results for input(s): "VITAMINB12", "FOLATE", "FERRITIN", "TIBC", "IRON", "RETICCTPCT" in the last 72 hours. Urine analysis:    Component Value Date/Time  COLORURINE STRAW (A) 10/01/2021 0330   APPEARANCEUR CLEAR 10/01/2021 0330   LABSPEC 1.005 10/01/2021 0330   PHURINE 6.0 10/01/2021 0330   GLUCOSEU NEGATIVE 10/01/2021 0330   HGBUR NEGATIVE 10/01/2021 0330   BILIRUBINUR NEGATIVE 10/01/2021 0330   KETONESUR NEGATIVE 10/01/2021 0330   PROTEINUR NEGATIVE 10/01/2021 0330   NITRITE NEGATIVE 10/01/2021 0330   LEUKOCYTESUR NEGATIVE 10/01/2021 0330   Sepsis Labs: @LABRCNTIP (procalcitonin:4,lacticidven:4) )No results found for this or any previous visit (from the past 240 hour(s)).   Radiological Exams on Admission: CT Angio Chest PE W and/or Wo Contrast  Result Date: 03/03/2022 CLINICAL DATA:  Pulmonary embolism suspected, high probability. Chest back and left arm pain. EXAM: CT ANGIOGRAPHY CHEST WITH CONTRAST TECHNIQUE: Multidetector CT imaging of the chest was performed using the standard protocol during bolus administration of intravenous contrast. Multiplanar CT image reconstructions and MIPs were obtained to evaluate the  vascular anatomy. RADIATION DOSE REDUCTION: This exam was performed according to the departmental dose-optimization program which includes automated exposure control, adjustment of the mA and/or kV according to patient size and/or use of iterative reconstruction technique. CONTRAST:  129mL OMNIPAQUE IOHEXOL 350 MG/ML SOLN COMPARISON:  09/13/2018. FINDINGS: Cardiovascular: The heart is enlarged and there is no pericardial effusion. Mild coronary artery calcifications are noted. There is mild atherosclerotic calcification of the aorta without evidence of aneurysm. The pulmonary trunk is normal in caliber. No large central pulmonary artery filling defect is identified. Evaluation of the segmental and subsegmental arteries is limited due to significant respiratory motion artifact. Mediastinum/Nodes: No enlarged mediastinal, hilar, or axillary lymph nodes. Thyroid gland, trachea, and esophagus demonstrate no significant findings. Lungs/Pleura: Mild emphysematous changes are noted at the lung apices. Mild atelectasis is present bilaterally. There is a moderate right pleural effusion and trace left pleural effusion. No pneumothorax. Upper Abdomen: Reflux of contrast into the hepatic veins and inferior vena cava may be associated with right heart failure. No acute abnormality. Musculoskeletal: Bilateral gynecomastia is noted. No acute osseous abnormality. Review of the MIP images confirms the above findings. IMPRESSION: 1. No central pulmonary artery filling defect. Evaluation of the segmental and subsegmental arteries is limited due to respiratory motion artifact. 2. Moderate right pleural effusion and trace pleural effusion with atelectasis bilaterally. 3. Cardiomegaly with coronary artery calcifications. 4. Reflux of contrast into the hepatic veins and inferior vena cava may be associated with right heart failure. 5. Minimal aortic atherosclerosis. Electronically Signed   By: Brett Fairy M.D.   On: 03/03/2022 00:57    DG Chest 2 View  Result Date: 03/02/2022 CLINICAL DATA:  Chest pain EXAM: CHEST - 2 VIEW COMPARISON:  11/09/2021 FINDINGS: Cardiomegaly. Unchanged, moderate right pleural effusion associated atelectasis or consolidation. The left lung is normally aerated. Osseous structures unremarkable. IMPRESSION: 1. Cardiomegaly. 2. Unchanged, moderate right pleural effusion and associated atelectasis or consolidation. 3. The left lung is normally aerated. Electronically Signed   By: Delanna Ahmadi M.D.   On: 03/02/2022 19:01    EKG: Independently reviewed. Sinus rhythm.  Assessment/Plan   1. Acute on chronic combined systolic & diastolic CHF  - Has been off his medications for 2-3 months and has developed progressive SOB  - EF was 20-25% with grade 3 diastolic dysfunction, moderate-severe MR, and severe TR on TTE from February 2023  - He was given IV Lasix in ED  - Continue diuresis with IV Lasix, can likely transition back to PO diuretic tomorrow, resume beta-blocker and SGLT2 inhibitor  2. Right pleural effusion  - Presents with atypical  chest pain and progressive SOB  - Found to have recurrent Rt pleural effusion, likely related to CHF with medication non-compliance; fluid studies from [facility] in 2020 consistent with transudate   - Consult IR for therapeutic thoracentesis  3. Chest pain  - Presents with 2-3 days of pain in upper chest back, worse with arm movement and tender on palpation  - Troponin minimally elevated and flat, no central PE on limited CTA  - Treat musculoskeletal pain    4. Hypertension  - Resume metoprolol     DVT prophylaxis: Lovenox  Code Status: Full  Level of Care: Level of care: Telemetry Family Communication: none present  Disposition Plan:  Patient is from: home  Anticipated d/c is to: Home  Anticipated d/c date is: 03/04/22  Patient currently: pending therapeutic thoracentesis  Consults called: none  Admission status: Observation     Briscoe Deutscher, MD [facility]  03/03/2022, 4:46 AM

## 2022-03-03 NOTE — Procedures (Signed)
PROCEDURE SUMMARY:  Successful US guided diagnostic and therapeutic right thoracentesis. Yielded 1 L of hazy, amber fluid. Pt tolerated procedure well. No immediate complications.  Specimen was sent for labs. CXR ordered.  EBL < 1 mL  Shon Hough, AGNP 03/03/2022 4:06 PM

## 2022-03-03 NOTE — ED Provider Notes (Signed)
Blood pressure (!) 134/105, pulse 96, temperature (!) 97.5 F (36.4 C), resp. rate 16, SpO2 100 %.  Assuming care from Dr. Posey Rea.  In short, Daryl [name] is a 61 y.o. male with a chief complaint of Chest Pain .  Refer to the original H&P for additional details.  The current plan of care is to follow up on CTA and reassess.  No PE on CTA. Independently interpreted imaging and agree with radiology interpretation. Plan for diuresis and admit.   Discussed patient's case with [facility] to request admission. Patient and family (if present) updated with plan. Care transferred to [facility] service.  I reviewed all nursing notes, vitals, pertinent old records, EKGs, labs, imaging (as available).     Maia Plan, MD 03/03/22 [phone]

## 2022-03-04 ENCOUNTER — Other Ambulatory Visit (HOSPITAL_COMMUNITY): Payer: Self-pay

## 2022-03-04 ENCOUNTER — Observation Stay (HOSPITAL_COMMUNITY): Payer: Medicaid Other

## 2022-03-04 DIAGNOSIS — Z91148 Patient's other noncompliance with medication regimen for other reason: Secondary | ICD-10-CM | POA: Diagnosis not present

## 2022-03-04 DIAGNOSIS — I10 Essential (primary) hypertension: Secondary | ICD-10-CM | POA: Diagnosis not present

## 2022-03-04 DIAGNOSIS — Z880 Allergy status to penicillin: Secondary | ICD-10-CM | POA: Diagnosis not present

## 2022-03-04 DIAGNOSIS — M4802 Spinal stenosis, cervical region: Secondary | ICD-10-CM | POA: Diagnosis present

## 2022-03-04 DIAGNOSIS — I38 Endocarditis, valve unspecified: Secondary | ICD-10-CM | POA: Diagnosis present

## 2022-03-04 DIAGNOSIS — E876 Hypokalemia: Secondary | ICD-10-CM | POA: Diagnosis not present

## 2022-03-04 DIAGNOSIS — I2510 Atherosclerotic heart disease of native coronary artery without angina pectoris: Secondary | ICD-10-CM | POA: Diagnosis present

## 2022-03-04 DIAGNOSIS — R748 Abnormal levels of other serum enzymes: Secondary | ICD-10-CM

## 2022-03-04 DIAGNOSIS — F17210 Nicotine dependence, cigarettes, uncomplicated: Secondary | ICD-10-CM | POA: Diagnosis present

## 2022-03-04 DIAGNOSIS — E785 Hyperlipidemia, unspecified: Secondary | ICD-10-CM | POA: Diagnosis present

## 2022-03-04 DIAGNOSIS — I5043 Acute on chronic combined systolic (congestive) and diastolic (congestive) heart failure: Secondary | ICD-10-CM | POA: Diagnosis present

## 2022-03-04 DIAGNOSIS — N179 Acute kidney failure, unspecified: Secondary | ICD-10-CM

## 2022-03-04 DIAGNOSIS — E878 Other disorders of electrolyte and fluid balance, not elsewhere classified: Secondary | ICD-10-CM | POA: Diagnosis present

## 2022-03-04 DIAGNOSIS — R072 Precordial pain: Secondary | ICD-10-CM | POA: Diagnosis present

## 2022-03-04 DIAGNOSIS — I130 Hypertensive heart and chronic kidney disease with heart failure and stage 1 through stage 4 chronic kidney disease, or unspecified chronic kidney disease: Secondary | ICD-10-CM | POA: Diagnosis present

## 2022-03-04 DIAGNOSIS — F1010 Alcohol abuse, uncomplicated: Secondary | ICD-10-CM | POA: Diagnosis present

## 2022-03-04 DIAGNOSIS — I428 Other cardiomyopathies: Secondary | ICD-10-CM | POA: Diagnosis present

## 2022-03-04 DIAGNOSIS — T502X5A Adverse effect of carbonic-anhydrase inhibitors, benzothiadiazides and other diuretics, initial encounter: Secondary | ICD-10-CM | POA: Diagnosis not present

## 2022-03-04 DIAGNOSIS — Z789 Other specified health status: Secondary | ICD-10-CM | POA: Diagnosis not present

## 2022-03-04 DIAGNOSIS — N1831 Chronic kidney disease, stage 3a: Secondary | ICD-10-CM | POA: Diagnosis present

## 2022-03-04 DIAGNOSIS — F1410 Cocaine abuse, uncomplicated: Secondary | ICD-10-CM | POA: Diagnosis present

## 2022-03-04 LAB — ECHOCARDIOGRAM COMPLETE
AR max vel: 2.46 cm2
AV Area VTI: 2.29 cm2
AV Area mean vel: 2.1 cm2
AV Mean grad: 3 mmHg
AV Peak grad: 4.5 mmHg
Ao pk vel: 1.06 m/s
Area-P 1/2: 7.99 cm2
Calc EF: 26.4 %
Height: 69 in
MV M vel: 4.23 m/s
MV Peak grad: 71.6 mmHg
Radius: 1 cm
S' Lateral: 5.9 cm
Single Plane A2C EF: 32.7 %
Single Plane A4C EF: 16.8 %
Weight: 2443.2 oz

## 2022-03-04 LAB — COMPREHENSIVE METABOLIC PANEL
ALT: 53 U/L — ABNORMAL HIGH (ref 0–44)
AST: 47 U/L — ABNORMAL HIGH (ref 15–41)
Albumin: 4.1 g/dL (ref 3.5–5.0)
Alkaline Phosphatase: 137 U/L — ABNORMAL HIGH (ref 38–126)
Anion gap: 10 (ref 5–15)
BUN: 20 mg/dL (ref 6–20)
CO2: 28 mmol/L (ref 22–32)
Calcium: 9.4 mg/dL (ref 8.9–10.3)
Chloride: 102 mmol/L (ref 98–111)
Creatinine, Ser: 1.27 mg/dL — ABNORMAL HIGH (ref 0.61–1.24)
GFR, Estimated: >60 mL/min (ref >60)
Glucose, Bld: 107 mg/dL — ABNORMAL HIGH (ref 70–99)
Potassium: 3.6 mmol/L (ref 3.5–5.1)
Sodium: 140 mmol/L (ref 135–145)
Total Bilirubin: 1.1 mg/dL (ref 0.3–1.2)
Total Protein: 7.3 g/dL (ref 6.5–8.1)

## 2022-03-04 LAB — CBC
HCT: 43.8 % (ref 39.0–52.0)
Hemoglobin: 14.1 g/dL (ref 13.0–17.0)
MCH: 28.5 pg (ref 26.0–34.0)
MCHC: 32.2 g/dL (ref 30.0–36.0)
MCV: 88.7 fL (ref 80.0–100.0)
Platelets: 386 10*3/uL (ref 150–400)
RBC: 4.94 MIL/uL (ref 4.22–5.81)
RDW: 14.7 % (ref 11.5–15.5)
WBC: 5.6 10*3/uL (ref 4.0–10.5)
nRBC: 0 % (ref 0.0–0.2)

## 2022-03-04 LAB — MAGNESIUM: Magnesium: 2.1 mg/dL (ref 1.7–2.4)

## 2022-03-04 LAB — PATHOLOGIST SMEAR REVIEW

## 2022-03-04 MED ORDER — CYCLOBENZAPRINE HCL 5 MG PO TABS
5.0000 mg | ORAL_TABLET | Freq: Three times a day (TID) | ORAL | Status: DC | PRN
  Administered 2022-03-07 – 2022-03-08 (×3): 5 mg via ORAL
  Filled 2022-03-04 (×3): qty 1

## 2022-03-04 MED ORDER — DICLOFENAC SODIUM 1 % EX GEL
2.0000 g | Freq: Four times a day (QID) | CUTANEOUS | Status: DC
  Administered 2022-03-04 – 2022-03-08 (×17): 2 g via TOPICAL
  Filled 2022-03-04: qty 100

## 2022-03-04 NOTE — TOC Initial Note (Deleted)
Transition of Care [facility]) - Initial/Assessment Note    Patient Details  Name: Daryl [name] MRN: [id number] Date of Birth: 10-Jan-1961  Transition of Care [facility]) CM/SW Contact:    Golda Acre, RN Phone Number: 03/04/2022, 9:00 AM  Clinical Narrative:                  Transition of Care [facility]) Screening Note   Patient Details  Name: Daryl [name] Date of Birth: 1960/09/04   [facility] CM/SW Contact:    Golda Acre, RN Phone Number: 03/04/2022, 9:00 AM    Transition of Care Department [facility]) has reviewed patient and no TOC needs have been identified at this time. We will continue to monitor patient advancement through interdisciplinary progression rounds. If new patient transition needs arise, please place a TOC consult.    Expected Discharge Plan: Home/Self Care Barriers to Discharge: Continued Medical Work up   Patient Goals and CMS Choice Patient states their goals for this hospitalization and ongoing recovery are:: to go home CMS Medicare.gov Compare Post Acute Care list provided to:: Patient Choice offered to / list presented to : Patient  Expected Discharge Plan and Services Expected Discharge Plan: Home/Self Care   Discharge Planning Services: CM Consult   Living arrangements for the past 2 months: Single Family Home                                      Prior Living Arrangements/Services Living arrangements for the past 2 months: Single Family Home Lives with:: Self Patient language and need for interpreter reviewed:: Yes Do you feel safe going back to the place where you live?: Yes            Criminal Activity/Legal Involvement Pertinent to Current Situation/Hospitalization: No - Comment as needed  Activities of Daily Living Home Assistive Devices/Equipment: None ADL Screening (condition at time of admission) Patient's cognitive ability adequate to safely complete daily activities?: Yes Is the patient deaf or have  difficulty hearing?: No Does the patient have difficulty seeing, even when wearing glasses/contacts?: No Does the patient have difficulty concentrating, remembering, or making decisions?: No Patient able to express need for assistance with ADLs?: Yes Does the patient have difficulty dressing or bathing?: No Independently performs ADLs?: Yes (appropriate for developmental age) Does the patient have difficulty walking or climbing stairs?: No Weakness of Legs: None Weakness of Arms/Hands: None  Permission Sought/Granted                  Emotional Assessment Appearance:: Appears stated age     Orientation: : Oriented to Self, Oriented to Place, Oriented to  Time, Oriented to Situation Alcohol / Substance Use: Not Applicable Psych Involvement: No (comment)  Admission diagnosis:  Precordial chest pain [R07.2] Pleural effusion [J90] Acute on chronic combined systolic and diastolic CHF (congestive heart failure) (HCC) [I50.43] Patient Active Problem List   Diagnosis Date Noted   Acute on chronic combined systolic and diastolic CHF (congestive heart failure) (HCC) 03/03/2022   Left-sided chest wall pain 03/03/2022   Hypokalemia 03/03/2022   Alcohol use 03/03/2022   Valvular heart disease 03/03/2022   Pleural effusion due to CHF (congestive heart failure) (HCC) 10/02/2021   Cocaine abuse (HCC) 10/01/2021   History of noncompliance with medical treatment 10/01/2021   Elevated LFTs 10/01/2021   Syncope and collapse 08/29/2021   Encounter to establish care 08/15/2021  Alcohol abuse with intoxication delirium (HCC)    Alcohol withdrawal delirium (HCC) 06/26/2021   Stage 3a chronic kidney disease (HCC) 02/19/2020   ETOH abuse 02/14/2020   Essential hypertension 11/02/2018   Nonischemic cardiomyopathy (HCC)    Chronic combined systolic and diastolic CHF (congestive heart failure) (HCC)    Tobacco use disorder 09/11/2018   PCP:  Patient, No Pcp Per Pharmacy:   [facility] [address], [address] [place] [address] Phone: [phone] Fax: [phone]  [facility] [address] [address] Phone: [phone] Fax: [phone]     Social Determinants of Health (SDOH) Interventions    Readmission Risk Interventions    10/02/2021   10:01 AM  Readmission Risk Prevention Plan  Transportation Screening Complete  Medication Review (RN Care Manager) Complete  PCP or Specialist appointment within 3-5 days of discharge Complete  HRI or Home Care Consult Complete  SW Recovery Care/Counseling Consult Complete  Palliative Care Screening Not Applicable  Skilled Nursing Facility Not Applicable

## 2022-03-04 NOTE — Plan of Care (Signed)
  Problem: Education: Goal: Knowledge of General Education information will improve Description Including pain rating scale, medication(s)/side effects and non-pharmacologic comfort measures Outcome: Progressing   Problem: Health Behavior/Discharge Planning: Goal: Ability to manage health-related needs will improve Outcome: Progressing   

## 2022-03-04 NOTE — Progress Notes (Signed)
Echocardiogram 2D Echocardiogram has been performed.  Rodrigo Ran 03/04/2022, 10:45 AM

## 2022-03-04 NOTE — Progress Notes (Signed)
PROGRESS NOTE  Daryl [name] [id number] DOB: May 09, 1961   PCP: Patient, No Pcp Per  Patient is from: Home.  Lives by himself.  DOA: 03/02/2022 LOS: 0  Chief complaints Chief Complaint  Patient presents with   Chest Pain     Brief Narrative / Interim history: 61 year old M with PMH of combined CHF, HTN, alcohol use, tobacco use, prior cocaine use and noncompliance presenting with 2 days of left-sided chest pain and upper back pain, shortness of breath, and admitted for acute on chronic combined CHF and right moderate pleural effusion.   In ED, slightly elevated BP.  Normal saturation on RA.  CMP and CBC without significant finding other than mildly elevated bilirubin.  BNP 1900 (lower than baseline).  Troponin 18>> 22.  CTA chest negative for PE but moderate right pleural effusion.  Patient was started on IV Lasix.  Underwent thoracocentesis with removal of 1 L transudative fluid.  Pleural fluid culture pending.  Diuretics discontinued due to AKI.  TTE better than prior.  Likely home in the next 24 to 48 hours if creatinine improves or  stable.  Subjective: Seen and examined earlier this morning.  No major events overnight of this morning.  Continues to endorse pain over left upper back.  He has focal tenderness medial to his left scapula.  No swelling or overlying erythema.  CT cervical spine showed degenerative changes with moderate foraminal stenosis on the left at C7-T1.  Objective: Vitals:   03/04/22 0500 03/04/22 0604 03/04/22 0911 03/04/22 1342  BP:  (!) 90/54 102/70 97/63  Pulse:  98 83 77  Resp:  19  16  Temp:  97.7 F (36.5 C)  97.9 F (36.6 C)  TempSrc:    Oral  SpO2:  97%  96%  Weight: 69.3 kg     Height:        Examination:  GENERAL: No apparent distress.  Nontoxic. HEENT: MMM.  Vision and hearing grossly intact.  NECK: Supple.  No apparent JVD.  RESP:  No IWOB.  Fair aeration bilaterally. CVS:  RRR.  2/6 SEM all over.  ABD/GI/GU: BS+. Abd soft, NTND.   MSK/EXT:  Moves extremities. No apparent deformity. No edema.  SKIN: no apparent skin lesion or wound NEURO: Awake and alert. Oriented appropriately.  No apparent focal neuro deficit. PSYCH: Calm. Normal affect.   Procedures:  None  Microbiology summarized: Pleural fluid culture pending.  Assessment and plan: Principal Problem:   Acute on chronic combined systolic and diastolic CHF (congestive heart failure) (HCC) Active Problems:   Tobacco use disorder   Essential hypertension   Pleural effusion due to CHF (congestive heart failure) (HCC)   Left-sided chest wall pain   Hypokalemia   Alcohol use   Valvular heart disease   Acute on chronic combined CHF: TTE with LVEF of 25 to 30%, GH, G3 DD, severe LAE and RAE, moderately reduced RV SF, moderate to severe MVR and TVR.  Overall, better than prior TTE in 09/2021 except for RVSF.  Heart shortness of breath.  BNP 1900 but chronic.  Imaging with moderate right-sided pleural effusion.  Noncompliant with diuretics and medications.  -S/p thoracocentesis with removal of 1 L on 7/10. -Diuresed with IV Lasix.  I&O incomplete but net 2.5 L.  Mild AKI -Hold IV Lasix.  Already received morning dose. -Continue home Farxiga, Toprol-XL -Monitor intake and output, renal functions and electrolytes.  Valvular heart disease: TTE with moderate to severe MVR and TVR as above.  Relatively better than  prior TTE -Outpatient follow-up with cardiology.  Moderate right-sided pleural effusion: Likely due to CHF. -S/p thoracocentesis as above -Follow fluid culture and cytology  AKI: Likely due to diuretics. Recent Labs    08/15/21 1949 10/01/21 0119 10/01/21 1325 10/02/21 0439 10/04/21 0452 10/05/21 1003 10/06/21 0450 10/14/21 1450 03/02/22 2018 03/03/22 0414 03/04/22 0941  BUN 20 17  --  21* 23* 17 20 11 15 13 20   CREATININE 1.15 1.11 1.25* 1.27* 0.92 1.14 1.26* 0.92 1.09 0.98 1.27*  -Hold IV Lasix.  Already received morning dose. -Recheck  in the morning  Musculoskeletal posterior chest wall pain-has focal tenderness medial to left scapula.  No swelling or erythema.  Symmetric strength in both extremities.  CT cervical spine with moderate foraminal stenosis at C7-T1. -Trial of Voltaren gel and gabapentin  Hypokalemia/hypomagnesemia: Resolved. -Monitor replenish as appropriate  Tobacco use disorder -Encourage smoking cessation.  Alcohol use Elevated liver enzymes: Congestive hepatopathy?  No GI symptoms.  HIV and acute hepatitis panel nonreactive previously -Encouraged cessation alcohol cessation -Check CK -Consider further work-up if no improvement.  Noncompliance -Counseled. -TOC consulted   Body mass index is 22.55 kg/m.          DVT prophylaxis:  enoxaparin (LOVENOX) injection 40 mg Start: 03/03/22 1000  Code Status: Full code Family Communication: None at bedside Level of care: Telemetry Status is: Observation The patient will require care spanning > 2 midnights and should be moved to inpatient because: AKI and acute on chronic combined CHF   Final disposition: Likely home in the next 24 to 48 hours Consultants:  Interventional radiology  Sch Meds:  Scheduled Meds:  atorvastatin  40 mg Oral Daily   dapagliflozin propanediol  10 mg Oral QAC breakfast   diclofenac Sodium  2 g Topical QID   enoxaparin (LOVENOX) injection  40 mg Subcutaneous Q24H   gabapentin  300 mg Oral TID   metoprolol succinate  12.5 mg Oral Daily   sodium chloride flush  3 mL Intravenous Q12H   tamsulosin  0.4 mg Oral Daily   Continuous Infusions: PRN Meds:.acetaminophen **OR** acetaminophen, cyclobenzaprine, mouth rinse, senna-docusate  Antimicrobials: Anti-infectives (From admission, onward)    None        I have personally reviewed the following labs and images: CBC: Recent Labs  Lab 03/02/22 2018 03/03/22 0414 03/04/22 0941  WBC 8.0 7.4 5.6  HGB 13.6 12.7* 14.1  HCT 42.7 39.3 43.8  MCV 89.1 88.3 88.7   PLT 327 331 386   BMP &GFR Recent Labs  Lab 03/02/22 2018 03/03/22 0414 03/04/22 0941  NA 140 139 140  K 4.2 3.2* 3.6  CL 109 107 102  CO2 21* 21* 28  GLUCOSE 96 152* 107*  BUN 15 13 20   CREATININE 1.09 0.98 1.27*  CALCIUM 9.1 8.8* 9.4  MG  --  1.7 2.1   Estimated Creatinine Clearance: 60.6 mL/min (A) (by C-G formula based on SCr of 1.27 mg/dL (H)). Liver & Pancreas: Recent Labs  Lab 03/03/22 0414 03/04/22 0941  AST 45* 47*  ALT 54* 53*  ALKPHOS 128* 137*  BILITOT 1.8* 1.1  PROT 6.7 7.3  ALBUMIN 3.8 4.1   No results for input(s): "LIPASE", "AMYLASE" in the last 168 hours. No results for input(s): "AMMONIA" in the last 168 hours. Diabetic: No results for input(s): "HGBA1C" in the last 72 hours. No results for input(s): "GLUCAP" in the last 168 hours. Cardiac Enzymes: No results for input(s): "CKTOTAL", "CKMB", "CKMBINDEX", "TROPONINI" in the last 168 hours. No  results for input(s): "PROBNP" in the last 8760 hours. Coagulation Profile: No results for input(s): "INR", "PROTIME" in the last 168 hours. Thyroid Function Tests: No results for input(s): "TSH", "T4TOTAL", "FREET4", "T3FREE", "THYROIDAB" in the last 72 hours. Lipid Profile: No results for input(s): "CHOL", "HDL", "LDLCALC", "TRIG", "CHOLHDL", "LDLDIRECT" in the last 72 hours. Anemia Panel: No results for input(s): "VITAMINB12", "FOLATE", "FERRITIN", "TIBC", "IRON", "RETICCTPCT" in the last 72 hours. Urine analysis:    Component Value Date/Time   COLORURINE STRAW (A) 10/01/2021 0330   APPEARANCEUR CLEAR 10/01/2021 0330   LABSPEC 1.005 10/01/2021 0330   PHURINE 6.0 10/01/2021 0330   GLUCOSEU NEGATIVE 10/01/2021 0330   HGBUR NEGATIVE 10/01/2021 0330   BILIRUBINUR NEGATIVE 10/01/2021 0330   KETONESUR NEGATIVE 10/01/2021 0330   PROTEINUR NEGATIVE 10/01/2021 0330   NITRITE NEGATIVE 10/01/2021 0330   LEUKOCYTESUR NEGATIVE 10/01/2021 0330   Sepsis Labs: Invalid input(s): "PROCALCITONIN",  "LACTICIDVEN"  Microbiology: Recent Results (from the past 240 hour(s))  Culture, body fluid w Gram Stain-bottle     Status: None (Preliminary result)   Collection Time: 03/03/22  3:49 PM   Specimen: Pleura  Result Value Ref Range Status   Specimen Description PLEURAL  Final   Special Requests RIGHT  Final   Culture   Final    NO GROWTH < 12 HOURS Performed at [facility], [address] [place], [place] [address]    Report Status PENDING  Incomplete  Gram stain     Status: None   Collection Time: 03/03/22  3:49 PM   Specimen: Pleura  Result Value Ref Range Status   Specimen Description PLEURAL  Final   Special Requests NONE  Final   Gram Stain   Final    WBC PRESENT, PREDOMINANTLY MONONUCLEAR NO ORGANISMS SEEN CYTOSPIN SMEAR Performed at [facility], [address] [place], [place] [address]    Report Status 03/03/2022 FINAL  Final    Radiology Studies: ECHOCARDIOGRAM COMPLETE  Result Date: 03/04/2022    ECHOCARDIOGRAM REPORT   Patient Name:   TEODORO JEFFREYS Date of Exam: 03/04/2022 Medical Rec #:  [id number]    Height:       69.0 in Accession #:    [id number]   Weight:       152.7 lb Date of Birth:  12-15-1960    BSA:          1.842 m Patient Age:    61 years     BP:           102/70 mmHg Patient Gender: M            HR:           78 bpm. Exam Location:  Inpatient Procedure: 2D Echo, Color Doppler and Cardiac Doppler Indications:    Congestive heart failure  History:        Patient has prior history of Echocardiogram examinations, most                 recent 10/01/2021. CHF, Signs/Symptoms:Chest Pain and Shortness of                 Breath; Risk Factors:Hypertension. ETOH and tobacco abuse.  Sonographer:    Rodrigo Ran RCS Referring Phys: [id number] Deward Sebek T Marylou Wages IMPRESSIONS  1. Left ventricular ejection fraction, by estimation, is 25 to 30%. The left ventricle has severely decreased function. The left ventricle demonstrates global hypokinesis. The left ventricular  internal cavity size was moderately dilated. Left ventricular diastolic  parameters are consistent with Grade III diastolic dysfunction (restrictive). Elevated left atrial pressure.  2. Right ventricular systolic function is moderately reduced. The right ventricular size is severely enlarged. There is mildly elevated pulmonary artery systolic pressure. The estimated right ventricular systolic pressure is 123XX123 mmHg.  3. Left atrial size was severely dilated.  4. Right atrial size was severely dilated.  5. The mitral valve is normal in structure. Moderate to severe mitral valve regurgitation.  6. Tricuspid valve regurgitation is moderate to severe.  7. The aortic valve is tricuspid. Aortic valve regurgitation is not visualized. No aortic stenosis is present.  8. The inferior vena cava is normal in size with <50% respiratory variability, suggesting right atrial pressure of 8 mmHg. Comparison(s): No significant change from prior study. Prior images reviewed side by side. FINDINGS  Left Ventricle: Left ventricular ejection fraction, by estimation, is 25 to 30%. The left ventricle has severely decreased function. The left ventricle demonstrates global hypokinesis. The left ventricular internal cavity size was moderately dilated. There is no left ventricular hypertrophy. Left ventricular diastolic parameters are consistent with Grade III diastolic dysfunction (restrictive). Elevated left atrial pressure. Right Ventricle: The right ventricular size is severely enlarged. No increase in right ventricular wall thickness. Right ventricular systolic function is moderately reduced. There is mildly elevated pulmonary artery systolic pressure. The tricuspid regurgitant velocity is 3.00 m/s, and with an assumed right atrial pressure of 8 mmHg, the estimated right ventricular systolic pressure is 123XX123 mmHg. Left Atrium: Left atrial size was severely dilated. Right Atrium: Right atrial size was severely dilated. Pericardium: There is  no evidence of pericardial effusion. Mitral Valve: The mitral valve is normal in structure. Moderate to severe mitral valve regurgitation, with centrally-directed jet. Tricuspid Valve: The tricuspid valve is normal in structure. Tricuspid valve regurgitation is moderate to severe. Aortic Valve: The aortic valve is tricuspid. Aortic valve regurgitation is not visualized. No aortic stenosis is present. Aortic valve mean gradient measures 3.0 mmHg. Aortic valve peak gradient measures 4.5 mmHg. Aortic valve area, by VTI measures 2.29 cm. Pulmonic Valve: The pulmonic valve was normal in structure. Pulmonic valve regurgitation is not visualized. Aorta: The aortic root and ascending aorta are structurally normal, with no evidence of dilitation. Venous: The inferior vena cava is normal in size with less than 50% respiratory variability, suggesting right atrial pressure of 8 mmHg. IAS/Shunts: There is right bowing of the interatrial septum, suggestive of elevated left atrial pressure. No atrial level shunt detected by color flow Doppler.  LEFT VENTRICLE PLAX 2D LVIDd:         6.30 cm      Diastology LVIDs:         5.90 cm      LV e' medial:    5.57 cm/s LV PW:         1.00 cm      LV E/e' medial:  18.7 LV IVS:        0.80 cm      LV e' lateral:   9.96 cm/s LVOT diam:     2.00 cm      LV E/e' lateral: 10.4 LV SV:         39 LV SV Index:   21 LVOT Area:     3.14 cm  LV Volumes (MOD) LV vol d, MOD A2C: 142.0 ml LV vol d, MOD A4C: 161.0 ml LV vol s, MOD A2C: 95.6 ml LV vol s, MOD A4C: 134.0 ml LV SV MOD A2C:  46.4 ml LV SV MOD A4C:     161.0 ml LV SV MOD BP:      42.0 ml RIGHT VENTRICLE            IVC RV Basal diam:  5.40 cm    IVC diam: 2.00 cm RV Mid diam:    4.10 cm RV S prime:     9.96 cm/s TAPSE (M-mode): 1.5 cm LEFT ATRIUM             Index        RIGHT ATRIUM           Index LA diam:        4.90 cm 2.66 cm/m   RA Area:     29.40 cm LA Vol (A2C):   62.7 ml 34.04 ml/m  RA Volume:   111.00 ml 60.26 ml/m LA Vol  (A4C):   85.0 ml 46.14 ml/m LA Biplane Vol: 77.9 ml 42.29 ml/m  AORTIC VALVE                    PULMONIC VALVE AV Area (Vmax):    2.46 cm     PV Vmax:       0.90 m/s AV Area (Vmean):   2.10 cm     PV Peak grad:  3.2 mmHg AV Area (VTI):     2.29 cm AV Vmax:           106.00 cm/s AV Vmean:          85.400 cm/s AV VTI:            0.170 m AV Peak Grad:      4.5 mmHg AV Mean Grad:      3.0 mmHg LVOT Vmax:         82.90 cm/s LVOT Vmean:        57.100 cm/s LVOT VTI:          0.124 m LVOT/AV VTI ratio: 0.73  AORTA Ao Root diam: 2.80 cm MITRAL VALVE                  TRICUSPID VALVE MV Area (PHT): 7.99 cm       TR Peak grad:   36.0 mmHg MV Decel Time: 95 msec        TR Vmax:        300.00 cm/s MR Peak grad:    71.6 mmHg MR Mean grad:    49.0 mmHg    SHUNTS MR Vmax:         423.00 cm/s  Systemic VTI:  0.12 m MR Vmean:        333.0 cm/s   Systemic Diam: 2.00 cm MR PISA:         6.28 cm MR PISA Eff ROA: 40 mm MR PISA Radius:  1.00 cm MV E velocity: 104.00 cm/s MV A velocity: 61.30 cm/s MV E/A ratio:  1.70 Mihai Croitoru MD Electronically signed by Sanda Klein MD Signature Date/Time: 03/04/2022/12:29:16 PM    Final    CT CERVICAL SPINE WO CONTRAST  Result Date: 03/03/2022 CLINICAL DATA:  Cervical radiculopathy without red flag symptoms EXAM: CT CERVICAL SPINE WITHOUT CONTRAST TECHNIQUE: Multidetector CT imaging of the cervical spine was performed without intravenous contrast. Multiplanar CT image reconstructions were also generated. RADIATION DOSE REDUCTION: This exam was performed according to the departmental dose-optimization program which includes automated exposure control, adjustment of the mA and/or kV according to patient size and/or use of iterative reconstruction technique. COMPARISON:  None Available. FINDINGS: Alignment: Straightening of cervical lordosis. Skull base and vertebrae: No acute fracture. No primary bone lesion or focal pathologic process. Soft tissues and spinal canal: No prevertebral fluid  or swelling. No visible canal hematoma. Disc levels: C2-3: Unremarkable. C3-4: Disc narrowing with endplate and uncovertebral ridging eccentric to the left where there is mild or moderate foraminal narrowing. C4-5: Disc space narrowing.  No bony impingement C5-6: Disc collapse with primarily ventral endplate ridging. Asymmetric left uncovertebral ridging with mild or moderate left foraminal narrowing C6-7: Disc narrowing and ventral spondylitic spurring. Asymmetric right uncovertebral ridging with mild right foraminal narrowing. C7-T1:Disc narrowing with uncovertebral ridging eccentric to the left where there is moderate foraminal stenosis Upper chest: No acute finding IMPRESSION: 1. 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 2. No bony spinal canal stenosis. Electronically Signed   By: Tiburcio Pea M.D.   On: 03/03/2022 21:59   DG Chest 1 View  Result Date: 03/03/2022 CLINICAL DATA:  Status post thoracentesis.  1 L off of right lung. EXAM: CHEST  1 VIEW COMPARISON:  Chest two views 03/02/2022 and CT chest 03/03/2022 FINDINGS: Interval decrease in now minimal right pleural effusion compared to the prior moderate right pleural effusion. Mild fluid tracks along the right lower lung developmental Fisher's. The left lung is clear. No pneumothorax. Cardiac silhouette is again mildly enlarged. Mediastinal contours are within normal limits. Minimal levocurvature of the lower thoracic spine. IMPRESSION: Marked improvement in the prior right pleural effusion following thoracentesis. No pneumothorax. Electronically Signed   By: Neita Garnet M.D.   On: 03/03/2022 16:24   US THORACENTESIS ASP PLEURAL SPACE W/IMG GUIDE  Result Date: 03/03/2022 INDICATION: Patient with history of HTN, CHF complaining of chest pain, back pain and dyspnea found to have moderate right pleural effusion. Request for diagnostic and therapeutic right thoracentesis. EXAM: ULTRASOUND  GUIDED DIAGNOSTIC AND THERAPEUTIC RIGHT THORACENTESIS MEDICATIONS: 10 mL 1 % lidocaine COMPLICATIONS: None immediate. PROCEDURE: An ultrasound guided thoracentesis was thoroughly discussed with the patient and questions answered. The benefits, risks, alternatives and complications were also discussed. The patient understands and wishes to proceed with the procedure. Written consent was obtained. Ultrasound was performed to localize and mark an adequate pocket of fluid in the right chest. The area was then prepped and draped in the normal sterile fashion. 1% Lidocaine was used for local anesthesia. Under ultrasound guidance a 6 Fr Safe-T-Centesis catheter was introduced. Thoracentesis was performed. The catheter was removed and a dressing applied. FINDINGS: A total of approximately 1 L of hazy, amber fluid was removed. Samples were sent to the laboratory as requested by the clinical team. IMPRESSION: Successful ultrasound guided right thoracentesis yielding 1 L of pleural fluid. Read by: Alex Gardener, AGNP-BC Electronically Signed   By: Malachy Moan M.D.   On: 03/03/2022 16:17      Kolbie Clarkston T. Kyng [facility]  If 7PM-7AM, please contact night-coverage [email or web address] 03/04/2022, 3:25 PM

## 2022-03-04 NOTE — TOC Progression Note (Signed)
Transition of Care [facility]) - Progression Note    Patient Details  Name: Daryl [name] MRN: [id number] Date of Birth: 1960-10-06  Transition of [facility]) CM/SW Contact  Golda Acre, RN Phone Number: 03/04/2022, 10:27 AM  Clinical Narrative:    Heart failure screening at home sent to [facility].   Expected Discharge Plan: Home/Self Care Barriers to Discharge: Continued Medical Work up  Expected Discharge Plan and Services Expected Discharge Plan: Home/Self Care   Discharge Planning Services: CM Consult Post Acute Care Choice: Home Health Living arrangements for the past 2 months: Single Family Home                           HH Arranged: Disease Management HH Agency: [facility] Date HH Agency Contacted: 03/04/22 Time HH Agency Contacted: 1027 Representative spoke with at [facility] Agency: Morrie Sheldon   Social Determinants of Health (SDOH) Interventions    Readmission Risk Interventions    10/02/2021   10:01 AM  Readmission Risk Prevention Plan  Transportation Screening Complete  Medication Review Oceanographer) Complete  PCP or Specialist appointment within 3-5 days of discharge Complete  HRI or Home Care Consult Complete  SW Recovery Care/Counseling Consult Complete  Palliative Care Screening Not Applicable  Skilled Nursing Facility Not Applicable

## 2022-03-04 NOTE — TOC Initial Note (Signed)
Transition of Care [facility]) - Initial/Assessment Note    Patient Details  Name: Daryl [name] MRN: [id number] Date of Birth: 1960/09/26  Transition of Care [facility]) CM/SW Contact:    Golda Acre, RN Phone Number: 03/04/2022, 10:24 AM  Clinical Narrative:                 Will need arrange heart failure screening for this patient,  has medicaid  Expected Discharge Plan: Home/Self Care Barriers to Discharge: Continued Medical Work up   Patient Goals and CMS Choice Patient states their goals for this hospitalization and ongoing recovery are:: to go home CMS Medicare.gov Compare Post Acute Care list provided to:: Patient Choice offered to / list presented to : Patient  Expected Discharge Plan and Services Expected Discharge Plan: Home/Self Care   Discharge Planning Services: CM Consult   Living arrangements for the past 2 months: Single Family Home                                      Prior Living Arrangements/Services Living arrangements for the past 2 months: Single Family Home Lives with:: Self Patient language and need for interpreter reviewed:: Yes Do you feel safe going back to the place where you live?: Yes            Criminal Activity/Legal Involvement Pertinent to Current Situation/Hospitalization: No - Comment as needed  Activities of Daily Living Home Assistive Devices/Equipment: None ADL Screening (condition at time of admission) Patient's cognitive ability adequate to safely complete daily activities?: Yes Is the patient deaf or have difficulty hearing?: No Does the patient have difficulty seeing, even when wearing glasses/contacts?: No Does the patient have difficulty concentrating, remembering, or making decisions?: No Patient able to express need for assistance with ADLs?: Yes Does the patient have difficulty dressing or bathing?: No Independently performs ADLs?: Yes (appropriate for developmental age) Does the patient have difficulty walking or  climbing stairs?: No Weakness of Legs: None Weakness of Arms/Hands: None  Permission Sought/Granted                  Emotional Assessment Appearance:: Appears stated age     Orientation: : Oriented to Self, Oriented to Place, Oriented to  Time, Oriented to Situation Alcohol / Substance Use: Not Applicable Psych Involvement: No (comment)  Admission diagnosis:  Precordial chest pain [R07.2] Pleural effusion [J90] Acute on chronic combined systolic and diastolic CHF (congestive heart failure) (HCC) [I50.43] Patient Active Problem List   Diagnosis Date Noted   Acute on chronic combined systolic and diastolic CHF (congestive heart failure) (HCC) 03/03/2022   Left-sided chest wall pain 03/03/2022   Hypokalemia 03/03/2022   Alcohol use 03/03/2022   Valvular heart disease 03/03/2022   Pleural effusion due to CHF (congestive heart failure) (HCC) 10/02/2021   Cocaine abuse (HCC) 10/01/2021   History of noncompliance with medical treatment 10/01/2021   Elevated LFTs 10/01/2021   Syncope and collapse 08/29/2021   Encounter to establish care 08/15/2021   Alcohol abuse with intoxication delirium (HCC)    Alcohol withdrawal delirium (HCC) 06/26/2021   Stage 3a chronic kidney disease (HCC) 02/19/2020   ETOH abuse 02/14/2020   Essential hypertension 11/02/2018   Nonischemic cardiomyopathy (HCC)    Chronic combined systolic and diastolic CHF (congestive heart failure) (HCC)    Tobacco use disorder 09/11/2018   PCP:  Patient, No Pcp Per Pharmacy:   [facility]  Pharmacy at [facility] [address], [address] [place] [address] Phone: [phone] Fax: [phone]  [facility] [address] [place] [address] Phone: [phone] Fax: [phone]     Social Determinants of Health (SDOH) Interventions    Readmission Risk Interventions    10/02/2021   10:01 AM  Readmission Risk Prevention Plan  Transportation  Screening Complete  Medication Review (RN Care Manager) Complete  PCP or Specialist appointment within 3-5 days of discharge Complete  HRI or Home Care Consult Complete  SW Recovery Care/Counseling Consult Complete  Palliative Care Screening Not Applicable  Skilled Nursing Facility Not Applicable

## 2022-03-05 ENCOUNTER — Other Ambulatory Visit (HOSPITAL_COMMUNITY): Payer: Self-pay

## 2022-03-05 DIAGNOSIS — I10 Essential (primary) hypertension: Secondary | ICD-10-CM | POA: Diagnosis not present

## 2022-03-05 DIAGNOSIS — Z789 Other specified health status: Secondary | ICD-10-CM | POA: Diagnosis not present

## 2022-03-05 DIAGNOSIS — E876 Hypokalemia: Secondary | ICD-10-CM | POA: Diagnosis not present

## 2022-03-05 DIAGNOSIS — I5043 Acute on chronic combined systolic (congestive) and diastolic (congestive) heart failure: Secondary | ICD-10-CM | POA: Diagnosis not present

## 2022-03-05 LAB — COMPREHENSIVE METABOLIC PANEL
ALT: 43 U/L (ref 0–44)
AST: 36 U/L (ref 15–41)
Albumin: 3.6 g/dL (ref 3.5–5.0)
Alkaline Phosphatase: 122 U/L (ref 38–126)
Anion gap: 9 (ref 5–15)
BUN: 19 mg/dL (ref 6–20)
CO2: 27 mmol/L (ref 22–32)
Calcium: 8.8 mg/dL — ABNORMAL LOW (ref 8.9–10.3)
Chloride: 102 mmol/L (ref 98–111)
Creatinine, Ser: 0.97 mg/dL (ref 0.61–1.24)
GFR, Estimated: >60 mL/min (ref >60)
Glucose, Bld: 136 mg/dL — ABNORMAL HIGH (ref 70–99)
Potassium: 4 mmol/L (ref 3.5–5.1)
Sodium: 138 mmol/L (ref 135–145)
Total Bilirubin: 0.8 mg/dL (ref 0.3–1.2)
Total Protein: 6.6 g/dL (ref 6.5–8.1)

## 2022-03-05 LAB — CBC
HCT: 45.3 % (ref 39.0–52.0)
Hemoglobin: 13.8 g/dL (ref 13.0–17.0)
MCH: 28 pg (ref 26.0–34.0)
MCHC: 30.5 g/dL (ref 30.0–36.0)
MCV: 91.9 fL (ref 80.0–100.0)
Platelets: 350 10*3/uL (ref 150–400)
RBC: 4.93 MIL/uL (ref 4.22–5.81)
RDW: 14.6 % (ref 11.5–15.5)
WBC: 5.1 10*3/uL (ref 4.0–10.5)
nRBC: 0 % (ref 0.0–0.2)

## 2022-03-05 LAB — BRAIN NATRIURETIC PEPTIDE: B Natriuretic Peptide: 661 pg/mL — ABNORMAL HIGH (ref 0.0–100.0)

## 2022-03-05 LAB — PHOSPHORUS: Phosphorus: 3.7 mg/dL (ref 2.5–4.6)

## 2022-03-05 LAB — CK: Total CK: 118 U/L (ref 49–397)

## 2022-03-05 LAB — MAGNESIUM: Magnesium: 2.1 mg/dL (ref 1.7–2.4)

## 2022-03-05 MED ORDER — SPIRONOLACTONE 25 MG PO TABS
25.0000 mg | ORAL_TABLET | Freq: Every day | ORAL | Status: DC
  Administered 2022-03-05 – 2022-03-08 (×4): 25 mg via ORAL
  Filled 2022-03-05 (×4): qty 1

## 2022-03-05 MED ORDER — METOPROLOL SUCCINATE ER 25 MG PO TB24: 25.0000 mg | ORAL_TABLET | Freq: Every day | ORAL | Status: DC

## 2022-03-05 MED ORDER — FUROSEMIDE 10 MG/ML IJ SOLN
40.0000 mg | Freq: Two times a day (BID) | INTRAMUSCULAR | Status: DC
  Administered 2022-03-05 – 2022-03-06 (×2): 40 mg via INTRAVENOUS
  Filled 2022-03-05 (×2): qty 4

## 2022-03-05 MED ORDER — METOPROLOL SUCCINATE ER 25 MG PO TB24
12.5000 mg | ORAL_TABLET | Freq: Every day | ORAL | Status: AC
Start: 2022-03-05 — End: 2022-03-05
  Administered 2022-03-05: 12.5 mg via ORAL
  Filled 2022-03-05: qty 1

## 2022-03-05 NOTE — TOC Progression Note (Addendum)
Transition of Care [facility]) - Progression Note    Patient Details  Name: Daryl [name] MRN: [id number] Date of Birth: Jan 10, 1961  Transition of Care [facility]) CM/SW Contact  Golda Acre, RN Phone Number: 03/05/2022, 10:06 AM  Clinical Narrative:    Request for rn and csw sent to [facility] for review. Does not take insurance. Text with information sent to [facility].will not take insurance No agencies to take insurance and patient has a history of substance abuse and noncompliance.  Md alerted. Expected Discharge Plan: Home/Self Care Barriers to Discharge: Continued Medical Work up  Expected Discharge Plan and Services Expected Discharge Plan: Home/Self Care   Discharge Planning Services: CM Consult Post Acute Care Choice: Home Health Living arrangements for the past 2 months: Single Family Home                           HH Arranged: Disease Management HH Agency: [facility] Date HH Agency Contacted: 03/04/22 Time HH Agency Contacted: 1027 Representative spoke with at [facility] Agency: Morrie Sheldon   Social Determinants of Health (SDOH) Interventions    Readmission Risk Interventions    10/02/2021   10:01 AM  Readmission Risk Prevention Plan  Transportation Screening Complete  Medication Review Ocean[facility]) Complete  PCP or Specialist appointment within 3-5 days of discharge Complete  HRI or Home Care Consult Complete  SW Recovery Care/Counseling Consult Complete  Palliative Care Screening Not Applicable  Skilled Nursing Facility Not Applicable

## 2022-03-05 NOTE — Plan of Care (Signed)
  Problem: Education: Goal: Ability to demonstrate management of disease process will improve Outcome: Progressing   Problem: Education: Goal: Knowledge of General Education information will improve Description: Including pain rating scale, medication(s)/side effects and non-pharmacologic comfort measures Outcome: Progressing   Problem: Health Behavior/Discharge Planning: Goal: Ability to manage health-related needs will improve Outcome: Progressing   

## 2022-03-05 NOTE — Progress Notes (Signed)
[facility] PROGRESS NOTE    Progress Note  Daryl [name]  VOJ:[id number] DOB: Jul 13, 1961 DOA: 03/02/2022 PCP: Patient, No Pcp Per     Brief Narrative:   Daryl [name] is an 61 y.o. male past medical history of combined systolic and diastolic heart failure, essential hypertension alcohol abuse polysubstance abuse and noncompliance comes in with 2 days history of left-sided chest pain and upper back pain accompanied by shortness of breath, was found to be in acute decompensated heart failure    Assessment/Plan:   Acute on chronic combined systolic and diastolic CHF/nonischemic cardiomyopathy: 2D echo on 03/04/2022 showed an EF of 25% global hypokinesia grade 3 diastolic heart failure.  With moderately reduced right ventricular function dilated right ventricle with elevated pulmonary arterial pressure. In the setting of noncompliance with the medication and alcohol abuse We will start on IV diuresis about 3 L.  Diuresis was held due to rise in his creatinine. Now has returned to baseline we will reinitiate diuresis start Aldactone Continue metoprolol and Farxiga. Recheck basic metabolic panel in the morning  Valvular heart disease: 2D echo showed severe MR and TR. Follow-up with cardiology as an outpatient.  Moderate right-sided pleural effusion: Likely due to decompensated heart failure. Status post thoracocentesis on 03/03/2022 with 1 L of amber fluid.    Acute kidney injury: Likely due to diuretics decrease aggressiveness of diuresis.  Musculoskeletal chest pain wall: Reproducible by palpation symmetric on both extremities. C-spine CT show moderate foraminal stenosis C7-T1. Continue Voltaren gel and Neurontin.  Electrolyte imbalance: Repleted now resolved.  Tobacco use: Counseling.  Alcohol abuse/elevated LFTs: Counseled elevation in LFTs was likely due to congestive heart failure and alcohol abuse now they have returned to baseline.  Noncompliance with his  medication: Counseling.    DVT prophylaxis: lovenox Family Communication:none Status is: Inpatient Remains inpatient appropriate because: Acute decompensated systolic heart failure.    Code Status:     Code Status Orders  (From admission, onward)           Start     Ordered   03/03/22 0213  Full code  Continuous        03/03/22 0213           Code Status History     Date Active Date Inactive Code Status Order ID Comments User Context   10/01/2021 0931 10/07/2021 1826 Full Code [id number]  Teddy Spike, DO ED   06/23/2021 0932 07/05/2021 2210 Full Code [id number]  Erick Blinks, MD ED   09/11/2018 2228 09/16/2018 1239 Full Code [id number]  Haydee Monica, MD ED         IV Access:   Peripheral IV   Procedures and diagnostic studies:   ECHOCARDIOGRAM COMPLETE  Result Date: 03/04/2022    ECHOCARDIOGRAM REPORT   Patient Name:   Daryl [name] Date of Exam: 03/04/2022 Medical Rec #:  [id number]    Height:       69.0 in Accession #:    [id number]   Weight:       152.7 lb Date of Birth:  Mar 22, 1961    BSA:          1.842 m Patient Age:    61 years     BP:           102/70 mmHg Patient Gender: M            HR:           78 bpm. Exam Location:  Inpatient Procedure:  2D Echo, Color Doppler and Cardiac Doppler Indications:    Congestive heart failure  History:        Patient has prior history of Echocardiogram examinations, most                 recent 10/01/2021. CHF, Signs/Symptoms:Chest Pain and Shortness of                 Breath; Risk Factors:Hypertension. ETOH and tobacco abuse.  Sonographer:    Rodrigo Ran RCS Referring Phys: [phone] TAYE T GONFA IMPRESSIONS  1. Left ventricular ejection fraction, by estimation, is 25 to 30%. The left ventricle has severely decreased function. The left ventricle demonstrates global hypokinesis. The left ventricular internal cavity size was moderately dilated. Left ventricular diastolic parameters are consistent with Grade III diastolic  dysfunction (restrictive). Elevated left atrial pressure.  2. Right ventricular systolic function is moderately reduced. The right ventricular size is severely enlarged. There is mildly elevated pulmonary artery systolic pressure. The estimated right ventricular systolic pressure is 44.0 mmHg.  3. Left atrial size was severely dilated.  4. Right atrial size was severely dilated.  5. The mitral valve is normal in structure. Moderate to severe mitral valve regurgitation.  6. Tricuspid valve regurgitation is moderate to severe.  7. The aortic valve is tricuspid. Aortic valve regurgitation is not visualized. No aortic stenosis is present.  8. The inferior vena cava is normal in size with <50% respiratory variability, suggesting right atrial pressure of 8 mmHg. Comparison(s): No significant change from prior study. Prior images reviewed side by side. FINDINGS  Left Ventricle: Left ventricular ejection fraction, by estimation, is 25 to 30%. The left ventricle has severely decreased function. The left ventricle demonstrates global hypokinesis. The left ventricular internal cavity size was moderately dilated. There is no left ventricular hypertrophy. Left ventricular diastolic parameters are consistent with Grade III diastolic dysfunction (restrictive). Elevated left atrial pressure. Right Ventricle: The right ventricular size is severely enlarged. No increase in right ventricular wall thickness. Right ventricular systolic function is moderately reduced. There is mildly elevated pulmonary artery systolic pressure. The tricuspid regurgitant velocity is 3.00 m/s, and with an assumed right atrial pressure of 8 mmHg, the estimated right ventricular systolic pressure is 44.0 mmHg. Left Atrium: Left atrial size was severely dilated. Right Atrium: Right atrial size was severely dilated. Pericardium: There is no evidence of pericardial effusion. Mitral Valve: The mitral valve is normal in structure. Moderate to severe mitral valve  regurgitation, with centrally-directed jet. Tricuspid Valve: The tricuspid valve is normal in structure. Tricuspid valve regurgitation is moderate to severe. Aortic Valve: The aortic valve is tricuspid. Aortic valve regurgitation is not visualized. No aortic stenosis is present. Aortic valve mean gradient measures 3.0 mmHg. Aortic valve peak gradient measures 4.5 mmHg. Aortic valve area, by VTI measures 2.29 cm. Pulmonic Valve: The pulmonic valve was normal in structure. Pulmonic valve regurgitation is not visualized. Aorta: The aortic root and ascending aorta are structurally normal, with no evidence of dilitation. Venous: The inferior vena cava is normal in size with less than 50% respiratory variability, suggesting right atrial pressure of 8 mmHg. IAS/Shunts: There is right bowing of the interatrial septum, suggestive of elevated left atrial pressure. No atrial level shunt detected by color flow Doppler.  LEFT VENTRICLE PLAX 2D LVIDd:         6.30 cm      Diastology LVIDs:         5.90 cm      LV e' medial:  5.57 cm/s LV PW:         1.00 cm      LV E/e' medial:  18.7 LV IVS:        0.80 cm      LV e' lateral:   9.96 cm/s LVOT diam:     2.00 cm      LV E/e' lateral: 10.4 LV SV:         39 LV SV Index:   21 LVOT Area:     3.14 cm  LV Volumes (MOD) LV vol d, MOD A2C: 142.0 ml LV vol d, MOD A4C: 161.0 ml LV vol s, MOD A2C: 95.6 ml LV vol s, MOD A4C: 134.0 ml LV SV MOD A2C:     46.4 ml LV SV MOD A4C:     161.0 ml LV SV MOD BP:      42.0 ml RIGHT VENTRICLE            IVC RV Basal diam:  5.40 cm    IVC diam: 2.00 cm RV Mid diam:    4.10 cm RV S prime:     9.96 cm/s TAPSE (M-mode): 1.5 cm LEFT ATRIUM             Index        RIGHT ATRIUM           Index LA diam:        4.90 cm 2.66 cm/m   RA Area:     29.40 cm LA Vol (A2C):   62.7 ml 34.04 ml/m  RA Volume:   111.00 ml 60.26 ml/m LA Vol (A4C):   85.0 ml 46.14 ml/m LA Biplane Vol: 77.9 ml 42.29 ml/m  AORTIC VALVE                    PULMONIC VALVE AV Area (Vmax):     2.46 cm     PV Vmax:       0.90 m/s AV Area (Vmean):   2.10 cm     PV Peak grad:  3.2 mmHg AV Area (VTI):     2.29 cm AV Vmax:           106.00 cm/s AV Vmean:          85.400 cm/s AV VTI:            0.170 m AV Peak Grad:      4.5 mmHg AV Mean Grad:      3.0 mmHg LVOT Vmax:         82.90 cm/s LVOT Vmean:        57.100 cm/s LVOT VTI:          0.124 m LVOT/AV VTI ratio: 0.73  AORTA Ao Root diam: 2.80 cm MITRAL VALVE                  TRICUSPID VALVE MV Area (PHT): 7.99 cm       TR Peak grad:   36.0 mmHg MV Decel Time: 95 msec        TR Vmax:        300.00 cm/s MR Peak grad:    71.6 mmHg MR Mean grad:    49.0 mmHg    SHUNTS MR Vmax:         423.00 cm/s  Systemic VTI:  0.12 m MR Vmean:        333.0 cm/s   Systemic Diam: 2.00 cm MR PISA:  6.28 cm MR PISA Eff ROA: 40 mm MR PISA Radius:  1.00 cm MV E velocity: 104.00 cm/s MV A velocity: 61.30 cm/s MV E/A ratio:  1.70 Mihai Croitoru MD Electronically signed by Sanda Klein MD Signature Date/Time: 03/04/2022/12:29:16 PM    Final    CT CERVICAL SPINE WO CONTRAST  Result Date: 03/03/2022 CLINICAL DATA:  Cervical radiculopathy without red flag symptoms EXAM: CT CERVICAL SPINE WITHOUT CONTRAST TECHNIQUE: Multidetector CT imaging of the cervical spine was performed without intravenous contrast. Multiplanar CT image reconstructions were also generated. RADIATION DOSE REDUCTION: This exam was performed according to the departmental dose-optimization program which includes automated exposure control, adjustment of the mA and/or kV according to patient size and/or use of iterative reconstruction technique. COMPARISON:  None Available. FINDINGS: Alignment: Straightening of cervical lordosis. Skull base and vertebrae: No acute fracture. No primary bone lesion or focal pathologic process. Soft tissues and spinal canal: No prevertebral fluid or swelling. No visible canal hematoma. Disc levels: C2-3: Unremarkable. C3-4: Disc narrowing with endplate and uncovertebral  ridging eccentric to the left where there is mild or moderate foraminal narrowing. C4-5: Disc space narrowing.  No bony impingement C5-6: Disc collapse with primarily ventral endplate ridging. Asymmetric left uncovertebral ridging with mild or moderate left foraminal narrowing C6-7: Disc narrowing and ventral spondylitic spurring. Asymmetric right uncovertebral ridging with mild right foraminal narrowing. C7-T1:Disc narrowing with uncovertebral ridging eccentric to the left where there is moderate foraminal stenosis Upper chest: No acute finding IMPRESSION: 1. 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 2. No bony spinal canal stenosis. Electronically Signed   By: Jorje Guild M.D.   On: 03/03/2022 21:59   DG Chest 1 View  Result Date: 03/03/2022 CLINICAL DATA:  Status post thoracentesis.  1 L off of right lung. EXAM: CHEST  1 VIEW COMPARISON:  Chest two views 03/02/2022 and CT chest 03/03/2022 FINDINGS: Interval decrease in now minimal right pleural effusion compared to the prior moderate right pleural effusion. Mild fluid tracks along the right lower lung developmental Fisher's. The left lung is clear. No pneumothorax. Cardiac silhouette is again mildly enlarged. Mediastinal contours are within normal limits. Minimal levocurvature of the lower thoracic spine. IMPRESSION: Marked improvement in the prior right pleural effusion following thoracentesis. No pneumothorax. Electronically Signed   By: Yvonne Kendall M.D.   On: 03/03/2022 16:24   US THORACENTESIS ASP PLEURAL SPACE W/IMG GUIDE  Result Date: 03/03/2022 INDICATION: Patient with history of HTN, CHF complaining of chest pain, back pain and dyspnea found to have moderate right pleural effusion. Request for diagnostic and therapeutic right thoracentesis. EXAM: ULTRASOUND GUIDED DIAGNOSTIC AND THERAPEUTIC RIGHT THORACENTESIS MEDICATIONS: 10 mL 1 % lidocaine COMPLICATIONS: None immediate. PROCEDURE: An  ultrasound guided thoracentesis was thoroughly discussed with the patient and questions answered. The benefits, risks, alternatives and complications were also discussed. The patient understands and wishes to proceed with the procedure. Written consent was obtained. Ultrasound was performed to localize and mark an adequate pocket of fluid in the right chest. The area was then prepped and draped in the normal sterile fashion. 1% Lidocaine was used for local anesthesia. Under ultrasound guidance a 6 Fr Safe-T-Centesis catheter was introduced. Thoracentesis was performed. The catheter was removed and a dressing applied. FINDINGS: A total of approximately 1 L of hazy, amber fluid was removed. Samples were sent to the laboratory as requested by the clinical team. IMPRESSION: Successful ultrasound guided right thoracentesis yielding 1 L of pleural fluid. Read  by: Narda Rutherford, AGNP-BC Electronically Signed   By: Jacqulynn Cadet M.D.   On: 03/03/2022 16:17     Medical Consultants:   None.   Subjective:    Daryl [name] relates his breathing is significantly better continues to have neck pain.  Objective:    Vitals:   03/05/22 0458 03/05/22 0500 03/05/22 0805 03/05/22 0902  BP: (!) 104/53  (!) 98/58 103/65  Pulse: 78  81 79  Resp: 20  20   Temp: 97.7 F (36.5 C)  98.4 F (36.9 C)   TempSrc: Oral  Oral   SpO2: 98%  100%   Weight:  70.2 kg    Height:       SpO2: 100 %   Intake/Output Summary (Last 24 hours) at 03/05/2022 1044 Last data filed at 03/05/2022 0600 Gross per 24 hour  Intake 1080 ml  Output 1600 ml  Net -520 ml   Filed Weights   03/03/22 0405 03/04/22 0500 03/05/22 0500  Weight: 72.6 kg 69.3 kg 70.2 kg    Exam: General exam: In no acute distress. Respiratory system: Good air movement and crackles at the right lung base Cardiovascular system: S1 & S2 heard, RRR.  Positive JVD Gastrointestinal system: Abdomen is nondistended, soft and nontender.  Extremities: No pedal  edema. Skin: No rashes, lesions or ulcers Psychiatry: Judgement and insight appear normal. Mood & affect appropriate.    Data Reviewed:    Labs: Basic Metabolic Panel: Recent Labs  Lab 03/02/22 2018 03/03/22 0414 03/04/22 0941 03/05/22 0433  NA 140 139 140 138  K 4.2 3.2* 3.6 4.0  CL 109 107 102 102  CO2 21* 21* 28 27  GLUCOSE 96 152* 107* 136*  BUN 15 13 20 19   CREATININE 1.09 0.98 1.27* 0.97  CALCIUM 9.1 8.8* 9.4 8.8*  MG  --  1.7 2.1 2.1  PHOS  --   --   --  3.7   GFR Estimated Creatinine Clearance: 80.4 mL/min (by C-G formula based on SCr of 0.97 mg/dL). Liver Function Tests: Recent Labs  Lab 03/03/22 0414 03/04/22 0941 03/05/22 0433  AST 45* 47* 36  ALT 54* 53* 43  ALKPHOS 128* 137* 122  BILITOT 1.8* 1.1 0.8  PROT 6.7 7.3 6.6  ALBUMIN 3.8 4.1 3.6   No results for input(s): "LIPASE", "AMYLASE" in the last 168 hours. No results for input(s): "AMMONIA" in the last 168 hours. Coagulation profile No results for input(s): "INR", "PROTIME" in the last 168 hours. COVID-19 Labs  Recent Labs    03/03/22 0414  LDH 184    Lab Results  Component Value Date   SARSCOV2NAA NEGATIVE 10/01/2021   [place] NEGATIVE 06/23/2021    CBC: Recent Labs  Lab 03/02/22 2018 03/03/22 0414 03/04/22 0941 03/05/22 0433  WBC 8.0 7.4 5.6 5.1  HGB 13.6 12.7* 14.1 13.8  HCT 42.7 39.3 43.8 45.3  MCV 89.1 88.3 88.7 91.9  PLT 327 331 386 350   Cardiac Enzymes: Recent Labs  Lab 03/05/22 0433  CKTOTAL 118   BNP (last 3 results) No results for input(s): "PROBNP" in the last 8760 hours. CBG: No results for input(s): "GLUCAP" in the last 168 hours. D-Dimer: No results for input(s): "DDIMER" in the last 72 hours. Hgb A1c: No results for input(s): "HGBA1C" in the last 72 hours. Lipid Profile: No results for input(s): "CHOL", "HDL", "LDLCALC", "TRIG", "CHOLHDL", "LDLDIRECT" in the last 72 hours. Thyroid function studies: No results for input(s): "TSH", "T4TOTAL",  "T3FREE", "THYROIDAB" in the last  72 hours.  Invalid input(s): "FREET3" Anemia work up: No results for input(s): "VITAMINB12", "FOLATE", "FERRITIN", "TIBC", "IRON", "RETICCTPCT" in the last 72 hours. Sepsis Labs: Recent Labs  Lab 03/02/22 2018 03/03/22 0414 03/04/22 0941 03/05/22 0433  WBC 8.0 7.4 5.6 5.1   Microbiology Recent Results (from the past 240 hour(s))  Culture, body fluid w Gram Stain-bottle     Status: None (Preliminary result)   Collection Time: 03/03/22  3:49 PM   Specimen: Pleura  Result Value Ref Range Status   Specimen Description PLEURAL  Final   Special Requests RIGHT  Final   Culture   Final    NO GROWTH 2 DAYS Performed at [facility], [address] [place], [place] [address]    Report Status PENDING  Incomplete  Gram stain     Status: None   Collection Time: 03/03/22  3:49 PM   Specimen: Pleura  Result Value Ref Range Status   Specimen Description PLEURAL  Final   Special Requests NONE  Final   Gram Stain   Final    WBC PRESENT, PREDOMINANTLY MONONUCLEAR NO ORGANISMS SEEN CYTOSPIN SMEAR Performed at [facility], [place] [address] [place], [place] [address]    Report Status 03/03/2022 FINAL  Final     Medications:    atorvastatin  40 mg Oral Daily   dapagliflozin propanediol  10 mg Oral QAC breakfast   diclofenac Sodium  2 g Topical QID   enoxaparin (LOVENOX) injection  40 mg Subcutaneous Q24H   gabapentin  300 mg Oral TID   metoprolol succinate  12.5 mg Oral Daily   sodium chloride flush  3 mL Intravenous Q12H   tamsulosin  0.4 mg Oral Daily   Continuous Infusions:    LOS: 1 day   Charlynne Cousins  [facility]  03/05/2022, 10:44 AM

## 2022-03-05 NOTE — Progress Notes (Signed)
Nurse was informed that pt had 20 beat run of widen QRS, 4 beat run of NSR followed by 16 run of widen QRS. Upon assessment pt was resting in bed watching a movie complaining of left arm pain radiating to left shoulder. Vital signs documented in chart.12 lead EKG transmitted and placed in chart. Notified MD at 1342 via amion, new orders placed by MD and completed by nurse.   While assessing pt's arm pain, RN noticed the room smelled like smoke. The room did not appear smokey but the bathroom had a strong smoke odor. RN asked pt was he smoking in the bathroom, He smirked and said yes. RN educated the pt on the hospital rules and that anything that he may ingest could negatively interact with medications ordered.  Notified MD at 1422.  [place]

## 2022-03-06 ENCOUNTER — Other Ambulatory Visit (HOSPITAL_COMMUNITY): Payer: Self-pay

## 2022-03-06 DIAGNOSIS — Z789 Other specified health status: Secondary | ICD-10-CM | POA: Diagnosis not present

## 2022-03-06 DIAGNOSIS — I10 Essential (primary) hypertension: Secondary | ICD-10-CM | POA: Diagnosis not present

## 2022-03-06 DIAGNOSIS — I5043 Acute on chronic combined systolic (congestive) and diastolic (congestive) heart failure: Secondary | ICD-10-CM | POA: Diagnosis not present

## 2022-03-06 DIAGNOSIS — E876 Hypokalemia: Secondary | ICD-10-CM | POA: Diagnosis not present

## 2022-03-06 LAB — BASIC METABOLIC PANEL
Anion gap: 9 (ref 5–15)
BUN: 21 mg/dL — ABNORMAL HIGH (ref 6–20)
CO2: 25 mmol/L (ref 22–32)
Calcium: 9 mg/dL (ref 8.9–10.3)
Chloride: 103 mmol/L (ref 98–111)
Creatinine, Ser: 1.02 mg/dL (ref 0.61–1.24)
GFR, Estimated: >60 mL/min (ref >60)
Glucose, Bld: 101 mg/dL — ABNORMAL HIGH (ref 70–99)
Potassium: 4.1 mmol/L (ref 3.5–5.1)
Sodium: 137 mmol/L (ref 135–145)

## 2022-03-06 MED ORDER — METOPROLOL SUCCINATE ER 50 MG PO TB24
50.0000 mg | ORAL_TABLET | Freq: Every day | ORAL | Status: DC
  Administered 2022-03-06: 50 mg via ORAL
  Filled 2022-03-06: qty 1

## 2022-03-06 MED ORDER — FUROSEMIDE 10 MG/ML IJ SOLN
60.0000 mg | Freq: Two times a day (BID) | INTRAMUSCULAR | Status: DC
  Administered 2022-03-06 – 2022-03-07 (×2): 60 mg via INTRAVENOUS
  Filled 2022-03-06 (×2): qty 6

## 2022-03-06 NOTE — Progress Notes (Signed)
[facility] PROGRESS NOTE    Progress Note  [name]  [id number] DOB: [birth year] DOA: 03/02/2022 PCP: Patient, No Pcp Per     Brief Narrative:   Daryl [name] is an 61 y.o. male past medical history of combined systolic and diastolic heart failure, essential hypertension alcohol abuse polysubstance abuse and noncompliance comes in with 2 days history of left-sided chest pain and upper back pain accompanied by shortness of breath, was found to be in acute decompensated heart failure, 2D echo on 03/04/2022 showed an EF of 25% global hypokinesia grade 3 diastolic heart failure.  With moderately reduced right ventricular function dilated right ventricle with elevated pulmonary arterial pressure.   Assessment/Plan:   Acute on chronic combined systolic and diastolic CHF/nonischemic cardiomyopathy: In the setting of noncompliance with the medication and alcohol abuse No further diuresis, restrict his fluid intake. Increase his Lasix and continue to monitor strict I's and O's. Now has returned to baseline we will reinitiate diuresis start Aldactone Continue metoprolol and Farxiga. Recheck basic metabolic panel in the morning Daily weights continue  Valvular heart disease: 2D echo showed severe MR and TR. Follow-up with cardiology as an outpatient.  Moderate right-sided pleural effusion: Likely due to decompensated heart failure. Status post thoracocentesis on 03/03/2022 with 1 L of amber fluid.    Acute kidney injury: Likely due to diuresis was held. It was now resumed.  Basic metabolic panels pending.  Musculoskeletal chest pain wall: Reproducible by palpation symmetric on both extremities. C-spine CT show moderate foraminal stenosis C7-T1. Continue Voltaren gel and Neurontin.  Electrolyte imbalance: Repleted now resolved.  Tobacco use: Counseling.  Alcohol abuse/elevated LFTs: Counseled elevation in LFTs was likely due to congestive heart failure and alcohol abuse  now they have returned to baseline.  Noncompliance with his medication: Counseling.    DVT prophylaxis: lovenox Family Communication:none Status is: Inpatient Remains inpatient appropriate because: Acute decompensated systolic heart failure.    Code Status:     Code Status Orders  (From admission, onward)           Start     Ordered   03/03/22 0213  Full code  Continuous        03/03/22 0213           Code Status History     Date Active Date Inactive Code Status Order ID Comments User Context   10/01/2021 0931 10/07/2021 1826 Full Code [id number]  Jonnie Finner, DO ED   06/23/2021 0932 07/05/2021 2210 Full Code [id number]  Kathie Dike, MD ED   09/11/2018 2228 09/16/2018 1239 Full Code [id number]  Phillips Grout, MD ED         IV Access:   Peripheral IV   Procedures and diagnostic studies:   ECHOCARDIOGRAM COMPLETE  Result Date: 03/04/2022    ECHOCARDIOGRAM REPORT   Patient Name:   Daryl [name] Date of Exam: 03/04/2022 Medical Rec #:  [id number]    Height:       69.0 in Accession #:    [id number]   Weight:       152.7 lb Date of Birth:  October 19, 1960    BSA:          1.842 m Patient Age:    61 years     BP:           102/70 mmHg Patient Gender: M            HR:  78 bpm. Exam Location:  Inpatient Procedure: 2D Echo, Color Doppler and Cardiac Doppler Indications:    Congestive heart failure  History:        Patient has prior history of Echocardiogram examinations, most                 recent 10/01/2021. CHF, Signs/Symptoms:Chest Pain and Shortness of                 Breath; Risk Factors:Hypertension. ETOH and tobacco abuse.  Sonographer:    Joette Catching RCS Referring Phys: [phone] TAYE T GONFA IMPRESSIONS  1. Left ventricular ejection fraction, by estimation, is 25 to 30%. The left ventricle has severely decreased function. The left ventricle demonstrates global hypokinesis. The left ventricular internal cavity size was moderately dilated. Left ventricular  diastolic parameters are consistent with Grade III diastolic dysfunction (restrictive). Elevated left atrial pressure.  2. Right ventricular systolic function is moderately reduced. The right ventricular size is severely enlarged. There is mildly elevated pulmonary artery systolic pressure. The estimated right ventricular systolic pressure is 123XX123 mmHg.  3. Left atrial size was severely dilated.  4. Right atrial size was severely dilated.  5. The mitral valve is normal in structure. Moderate to severe mitral valve regurgitation.  6. Tricuspid valve regurgitation is moderate to severe.  7. The aortic valve is tricuspid. Aortic valve regurgitation is not visualized. No aortic stenosis is present.  8. The inferior vena cava is normal in size with <50% respiratory variability, suggesting right atrial pressure of 8 mmHg. Comparison(s): No significant change from prior study. Prior images reviewed side by side. FINDINGS  Left Ventricle: Left ventricular ejection fraction, by estimation, is 25 to 30%. The left ventricle has severely decreased function. The left ventricle demonstrates global hypokinesis. The left ventricular internal cavity size was moderately dilated. There is no left ventricular hypertrophy. Left ventricular diastolic parameters are consistent with Grade III diastolic dysfunction (restrictive). Elevated left atrial pressure. Right Ventricle: The right ventricular size is severely enlarged. No increase in right ventricular wall thickness. Right ventricular systolic function is moderately reduced. There is mildly elevated pulmonary artery systolic pressure. The tricuspid regurgitant velocity is 3.00 m/s, and with an assumed right atrial pressure of 8 mmHg, the estimated right ventricular systolic pressure is 123XX123 mmHg. Left Atrium: Left atrial size was severely dilated. Right Atrium: Right atrial size was severely dilated. Pericardium: There is no evidence of pericardial effusion. Mitral Valve: The mitral  valve is normal in structure. Moderate to severe mitral valve regurgitation, with centrally-directed jet. Tricuspid Valve: The tricuspid valve is normal in structure. Tricuspid valve regurgitation is moderate to severe. Aortic Valve: The aortic valve is tricuspid. Aortic valve regurgitation is not visualized. No aortic stenosis is present. Aortic valve mean gradient measures 3.0 mmHg. Aortic valve peak gradient measures 4.5 mmHg. Aortic valve area, by VTI measures 2.29 cm. Pulmonic Valve: The pulmonic valve was normal in structure. Pulmonic valve regurgitation is not visualized. Aorta: The aortic root and ascending aorta are structurally normal, with no evidence of dilitation. Venous: The inferior vena cava is normal in size with less than 50% respiratory variability, suggesting right atrial pressure of 8 mmHg. IAS/Shunts: There is right bowing of the interatrial septum, suggestive of elevated left atrial pressure. No atrial level shunt detected by color flow Doppler.  LEFT VENTRICLE PLAX 2D LVIDd:         6.30 cm      Diastology LVIDs:         5.90 cm  LV e' medial:    5.57 cm/s LV PW:         1.00 cm      LV E/e' medial:  18.7 LV IVS:        0.80 cm      LV e' lateral:   9.96 cm/s LVOT diam:     2.00 cm      LV E/e' lateral: 10.4 LV SV:         39 LV SV Index:   21 LVOT Area:     3.14 cm  LV Volumes (MOD) LV vol d, MOD A2C: 142.0 ml LV vol d, MOD A4C: 161.0 ml LV vol s, MOD A2C: 95.6 ml LV vol s, MOD A4C: 134.0 ml LV SV MOD A2C:     46.4 ml LV SV MOD A4C:     161.0 ml LV SV MOD BP:      42.0 ml RIGHT VENTRICLE            IVC RV Basal diam:  5.40 cm    IVC diam: 2.00 cm RV Mid diam:    4.10 cm RV S prime:     9.96 cm/s TAPSE (M-mode): 1.5 cm LEFT ATRIUM             Index        RIGHT ATRIUM           Index LA diam:        4.90 cm 2.66 cm/m   RA Area:     29.40 cm LA Vol (A2C):   62.7 ml 34.04 ml/m  RA Volume:   111.00 ml 60.26 ml/m LA Vol (A4C):   85.0 ml 46.14 ml/m LA Biplane Vol: 77.9 ml 42.29 ml/m   AORTIC VALVE                    PULMONIC VALVE AV Area (Vmax):    2.46 cm     PV Vmax:       0.90 m/s AV Area (Vmean):   2.10 cm     PV Peak grad:  3.2 mmHg AV Area (VTI):     2.29 cm AV Vmax:           106.00 cm/s AV Vmean:          85.400 cm/s AV VTI:            0.170 m AV Peak Grad:      4.5 mmHg AV Mean Grad:      3.0 mmHg LVOT Vmax:         82.90 cm/s LVOT Vmean:        57.100 cm/s LVOT VTI:          0.124 m LVOT/AV VTI ratio: 0.73  AORTA Ao Root diam: 2.80 cm MITRAL VALVE                  TRICUSPID VALVE MV Area (PHT): 7.99 cm       TR Peak grad:   36.0 mmHg MV Decel Time: 95 msec        TR Vmax:        300.00 cm/s MR Peak grad:    71.6 mmHg MR Mean grad:    49.0 mmHg    SHUNTS MR Vmax:         423.00 cm/s  Systemic VTI:  0.12 m MR Vmean:        333.0 cm/s   Systemic Diam: 2.00 cm MR PISA:  6.28 cm MR PISA Eff ROA: 40 mm MR PISA Radius:  1.00 cm MV E velocity: 104.00 cm/s MV A velocity: 61.30 cm/s MV E/A ratio:  1.70 Mihai Croitoru MD Electronically signed by Thurmon Fair MD Signature Date/Time: 03/04/2022/12:29:16 PM    Final      Medical Consultants:   None.   Subjective:    Daryl [name] relates his pain is better, mouth is really dry.  Objective:    Vitals:   03/05/22 2014 03/06/22 0000 03/06/22 0500 03/06/22 0521  BP: 103/75 106/72  92/67  Pulse: 79 84  82  Resp: 20     Temp: 97.7 F (36.5 C)   97.6 F (36.4 C)  TempSrc:    Oral  SpO2: 99%   95%  Weight:   68.1 kg   Height:       SpO2: 95 %   Intake/Output Summary (Last 24 hours) at 03/06/2022 0841 Last data filed at 03/06/2022 0600 Gross per 24 hour  Intake 453 ml  Output --  Net 453 ml    Filed Weights   03/04/22 0500 03/05/22 0500 03/06/22 0500  Weight: 69.3 kg 70.2 kg 68.1 kg    Exam: General exam: In no acute distress. Respiratory system: Good air movement and clear to auscultation. Cardiovascular system: S1 & S2 heard, RRR.  Positive hepatojugular reflux Gastrointestinal system: Abdomen is  nondistended, soft and nontender.  Extremities: No pedal edema. Skin: No rashes, lesions or ulcers Psychiatry: Judgement and insight appear normal. Mood & affect appropriate. Data Reviewed:    Labs: Basic Metabolic Panel: Recent Labs  Lab 03/02/22 2018 03/03/22 0414 03/04/22 0941 03/05/22 0433  NA 140 139 140 138  K 4.2 3.2* 3.6 4.0  CL 109 107 102 102  CO2 21* 21* 28 27  GLUCOSE 96 152* 107* 136*  BUN 15 13 20 19   CREATININE 1.09 0.98 1.27* 0.97  CALCIUM 9.1 8.8* 9.4 8.8*  MG  --  1.7 2.1 2.1  PHOS  --   --   --  3.7    GFR Estimated Creatinine Clearance: 78 mL/min (by C-G formula based on SCr of 0.97 mg/dL). Liver Function Tests: Recent Labs  Lab 03/03/22 0414 03/04/22 0941 03/05/22 0433  AST 45* 47* 36  ALT 54* 53* 43  ALKPHOS 128* 137* 122  BILITOT 1.8* 1.1 0.8  PROT 6.7 7.3 6.6  ALBUMIN 3.8 4.1 3.6    No results for input(s): "LIPASE", "AMYLASE" in the last 168 hours. No results for input(s): "AMMONIA" in the last 168 hours. Coagulation profile No results for input(s): "INR", "PROTIME" in the last 168 hours. COVID-19 Labs  No results for input(s): "DDIMER", "FERRITIN", "LDH", "CRP" in the last 72 hours.   Lab Results  Component Value Date   SARSCOV2NAA NEGATIVE 10/01/2021   SARSCOV2NAA NEGATIVE 06/23/2021    CBC: Recent Labs  Lab 03/02/22 2018 03/03/22 0414 03/04/22 0941 03/05/22 0433  WBC 8.0 7.4 5.6 5.1  HGB 13.6 12.7* 14.1 13.8  HCT 42.7 39.3 43.8 45.3  MCV 89.1 88.3 88.7 91.9  PLT 327 331 386 350    Cardiac Enzymes: Recent Labs  Lab 03/05/22 0433  CKTOTAL 118    BNP (last 3 results) No results for input(s): "PROBNP" in the last 8760 hours. CBG: No results for input(s): "GLUCAP" in the last 168 hours. D-Dimer: No results for input(s): "DDIMER" in the last 72 hours. Hgb A1c: No results for input(s): "HGBA1C" in the last 72 hours. Lipid Profile: No results for input(s): "  CHOL", "HDL", "LDLCALC", "TRIG", "CHOLHDL",  "LDLDIRECT" in the last 72 hours. Thyroid function studies: No results for input(s): "TSH", "T4TOTAL", "T3FREE", "THYROIDAB" in the last 72 hours.  Invalid input(s): "FREET3" Anemia work up: No results for input(s): "VITAMINB12", "FOLATE", "FERRITIN", "TIBC", "IRON", "RETICCTPCT" in the last 72 hours. Sepsis Labs: Recent Labs  Lab 03/02/22 2018 03/03/22 0414 03/04/22 0941 03/05/22 0433  WBC 8.0 7.4 5.6 5.1    Microbiology Recent Results (from the past 240 hour(s))  Culture, body fluid w Gram Stain-bottle     Status: None (Preliminary result)   Collection Time: 03/03/22  3:49 PM   Specimen: Pleura  Result Value Ref Range Status   Specimen Description PLEURAL  Final   Special Requests RIGHT  Final   Culture   Final    NO GROWTH 3 DAYS Performed at [facility], [address] [place], [place] [address]    Report Status PENDING  Incomplete  Gram stain     Status: None   Collection Time: 03/03/22  3:49 PM   Specimen: Pleura  Result Value Ref Range Status   Specimen Description PLEURAL  Final   Special Requests NONE  Final   Gram Stain   Final    WBC PRESENT, PREDOMINANTLY MONONUCLEAR NO ORGANISMS SEEN CYTOSPIN SMEAR Performed at [facility], [address], [place], [place] [address]    Report Status 03/03/2022 FINAL  Final     Medications:    atorvastatin  40 mg Oral Daily   dapagliflozin propanediol  10 mg Oral QAC breakfast   diclofenac Sodium  2 g Topical QID   enoxaparin (LOVENOX) injection  40 mg Subcutaneous Q24H   furosemide  40 mg Intravenous Q12H   gabapentin  300 mg Oral TID   metoprolol succinate  25 mg Oral Daily   sodium chloride flush  3 mL Intravenous Q12H   spironolactone  25 mg Oral Daily   tamsulosin  0.4 mg Oral Daily   Continuous Infusions:    LOS: 2 days   Marinda Elk  [facility]  03/06/2022, 8:41 AM

## 2022-03-06 NOTE — Progress Notes (Signed)
Patient had 17 run of Vtach. Vitals signs taken and is within normal range. Patient is asymptomatic. On call NP made aware.

## 2022-03-07 ENCOUNTER — Other Ambulatory Visit: Payer: Self-pay

## 2022-03-07 ENCOUNTER — Other Ambulatory Visit (HOSPITAL_COMMUNITY): Payer: Self-pay

## 2022-03-07 DIAGNOSIS — I5043 Acute on chronic combined systolic (congestive) and diastolic (congestive) heart failure: Secondary | ICD-10-CM | POA: Diagnosis not present

## 2022-03-07 DIAGNOSIS — Z789 Other specified health status: Secondary | ICD-10-CM | POA: Diagnosis not present

## 2022-03-07 DIAGNOSIS — E876 Hypokalemia: Secondary | ICD-10-CM | POA: Diagnosis not present

## 2022-03-07 DIAGNOSIS — I10 Essential (primary) hypertension: Secondary | ICD-10-CM | POA: Diagnosis not present

## 2022-03-07 LAB — BASIC METABOLIC PANEL
Anion gap: 10 (ref 5–15)
BUN: 26 mg/dL — ABNORMAL HIGH (ref 6–20)
CO2: 26 mmol/L (ref 22–32)
Calcium: 9 mg/dL (ref 8.9–10.3)
Chloride: 98 mmol/L (ref 98–111)
Creatinine, Ser: 1.19 mg/dL (ref 0.61–1.24)
GFR, Estimated: >60 mL/min (ref >60)
Glucose, Bld: 97 mg/dL (ref 70–99)
Potassium: 3.8 mmol/L (ref 3.5–5.1)
Sodium: 134 mmol/L — ABNORMAL LOW (ref 135–145)

## 2022-03-07 MED ORDER — DAPAGLIFLOZIN PROPANEDIOL 10 MG PO TABS
10.0000 mg | ORAL_TABLET | Freq: Every day | ORAL | 0 refills | Status: DC
  Filled 2022-03-07: qty 90, 90d supply, fill #0

## 2022-03-07 MED ORDER — SPIRONOLACTONE 25 MG PO TABS
25.0000 mg | ORAL_TABLET | Freq: Every day | ORAL | 3 refills | Status: DC
  Filled 2022-03-07: qty 30, 30d supply, fill #0

## 2022-03-07 MED ORDER — ATORVASTATIN CALCIUM 40 MG PO TABS
40.0000 mg | ORAL_TABLET | Freq: Every day | ORAL | 0 refills | Status: DC
  Filled 2022-03-07: qty 90, 90d supply, fill #0

## 2022-03-07 MED ORDER — METOPROLOL SUCCINATE ER 50 MG PO TB24
75.0000 mg | ORAL_TABLET | Freq: Every day | ORAL | Status: DC
  Administered 2022-03-07 – 2022-03-08 (×2): 75 mg via ORAL
  Filled 2022-03-07 (×2): qty 1

## 2022-03-07 MED ORDER — TORSEMIDE 20 MG PO TABS
40.0000 mg | ORAL_TABLET | Freq: Two times a day (BID) | ORAL | 3 refills | Status: DC
  Filled 2022-03-07: qty 60, 15d supply, fill #0

## 2022-03-07 MED ORDER — FUROSEMIDE 40 MG PO TABS
60.0000 mg | ORAL_TABLET | Freq: Two times a day (BID) | ORAL | Status: DC
  Administered 2022-03-07 – 2022-03-08 (×3): 60 mg via ORAL
  Filled 2022-03-07 (×3): qty 1

## 2022-03-07 MED ORDER — GABAPENTIN 300 MG PO CAPS
300.0000 mg | ORAL_CAPSULE | Freq: Three times a day (TID) | ORAL | 3 refills | Status: DC
  Filled 2022-03-07: qty 90, 30d supply, fill #0

## 2022-03-07 MED ORDER — TAMSULOSIN HCL 0.4 MG PO CAPS
0.4000 mg | ORAL_CAPSULE | Freq: Every day | ORAL | 1 refills | Status: DC
  Filled 2022-03-07: qty 30, 30d supply, fill #0

## 2022-03-07 MED ORDER — METOPROLOL SUCCINATE ER 25 MG PO TB24
12.5000 mg | ORAL_TABLET | Freq: Every day | ORAL | 0 refills | Status: DC
  Filled 2022-03-07: qty 45, 90d supply, fill #0

## 2022-03-07 NOTE — Progress Notes (Signed)
Cardiac monitoring order expired.  MD messaged to confirm discontinuation, MD gave order to discontinue.  Cardiac monitoring discontinued.  Bradd Burner, RN

## 2022-03-07 NOTE — Progress Notes (Signed)
Spoke with patient for 20 min about reason for fluid retention and importance of avoiding drugs and alcohol.  Patient verbalized understanding.  Bradd Burner, RN

## 2022-03-07 NOTE — Progress Notes (Signed)
   03/07/22 0348  Vitals  BP 99/75  MAP (mmHg) 84  BP Location Right Arm  BP Method Automatic  Patient Position (if appropriate) Lying  Pulse Rate 78  Pulse Rate Source Monitor  Resp 20   Patient had 6 runs of accelerated idioventricular beats. Asymptomatic. On call NP made aware.

## 2022-03-07 NOTE — Progress Notes (Signed)
[facility] PROGRESS NOTE    Progress Note  LAMAJ TOEPFER  [id number] DOB: [birth year] DOA: 03/02/2022 PCP: Patient, No Pcp Per     Brief Narrative:   Daryl [name] is an 61 y.o. male past medical history of combined systolic and diastolic heart failure, essential hypertension alcohol abuse polysubstance abuse and noncompliance comes in with 2 days history of left-sided chest pain and upper back pain accompanied by shortness of breath, was found to be in acute decompensated heart failure, 2D echo on 03/04/2022 showed an EF of 25% global hypokinesia grade 3 diastolic heart failure.  With moderately reduced right ventricular function dilated right ventricle with elevated pulmonary arterial pressure.  Cardiology has evaluated him in the past but has had intermittent office follow-up he related he has a very poor prognosis due to his limited understanding of his disease, marginal social support and adherence.  He has refused Lasix as an outpatient.    Assessment/Plan:   Acute on chronic combined systolic and diastolic CHF/nonischemic cardiomyopathy: In the setting of noncompliance with the medication and alcohol abuse Has remained negative about 2 L. Creatinine is slowly rising. We will transition his Lasix to orals. Continue Aldactone metoprolol and Farxiga. Probably home in the morning.  Valvular heart disease: 2D echo showed severe MR and TR. Follow-up with cardiology as an outpatient.  Moderate right-sided pleural effusion: Likely due to decompensated heart failure. Status post thoracocentesis on 03/03/2022 with 1 L of amber fluid.    Acute kidney injury: Likely due to diuresis was held. It was now resumed.  Basic metabolic panels pending.  Musculoskeletal chest pain wall: Reproducible by palpation symmetric on both extremities. C-spine CT show moderate foraminal stenosis C7-T1. Continue Voltaren gel and Neurontin.  Electrolyte imbalance: Repleted now  resolved.  Tobacco use: Counseling.  Alcohol abuse/elevated LFTs: Counseled elevation in LFTs was likely due to congestive heart failure and alcohol abuse now they have returned to baseline.  Noncompliance with his medication and outpatient follow-up: Counseling.    DVT prophylaxis: lovenox Family Communication:none Status is: Inpatient Remains inpatient appropriate because: Acute decompensated systolic heart failure.    Code Status:     Code Status Orders  (From admission, onward)           Start     Ordered   03/03/22 0213  Full code  Continuous        03/03/22 0213           Code Status History     Date Active Date Inactive Code Status Order ID Comments User Context   10/01/2021 0931 10/07/2021 1826 Full Code [id number]  Jonnie Finner, DO ED   06/23/2021 0932 07/05/2021 2210 Full Code [id number]  Kathie Dike, MD ED   09/11/2018 2228 09/16/2018 1239 Full Code [id number]  Phillips Grout, MD ED         IV Access:   Peripheral IV   Procedures and diagnostic studies:   No results found.   Medical Consultants:   None.   Subjective:    [name] a bowel movement breathing is better.  Objective:    Vitals:   03/07/22 0042 03/07/22 0348 03/07/22 0500 03/07/22 0551  BP: 106/71 99/75    Pulse: 75 78  90  Resp:  20    Temp:  97.9 F (36.6 C)    TempSrc:  Oral    SpO2: 100% 100%    Weight:   68 kg   Height:  SpO2: 100 %   Intake/Output Summary (Last 24 hours) at 03/07/2022 0834 Last data filed at 03/07/2022 0600 Gross per 24 hour  Intake 1675 ml  Output 300 ml  Net 1375 ml    Filed Weights   03/05/22 0500 03/06/22 0500 03/07/22 0500  Weight: 70.2 kg 68.1 kg 68 kg    Exam: General exam: In no acute distress. Respiratory system: Good air movement and clear to auscultation. Cardiovascular system: S1 & S2 heard, RRR. No JVD. Gastrointestinal system: Abdomen is nondistended, soft and nontender.  Extremities: No pedal  edema. Skin: No rashes, lesions or ulcers Psychiatry: Judgement and insight appear normal. Mood & affect appropriate. Data Reviewed:    Labs: Basic Metabolic Panel: Recent Labs  Lab 03/03/22 0414 03/04/22 0941 03/05/22 0433 03/06/22 0857 03/07/22 0727  NA 139 140 138 137 134*  K 3.2* 3.6 4.0 4.1 3.8  CL 107 102 102 103 98  CO2 21* 28 27 25 26   GLUCOSE 152* 107* 136* 101* 97  BUN 13 20 19  21* 26*  CREATININE 0.98 1.27* 0.97 1.02 1.19  CALCIUM 8.8* 9.4 8.8* 9.0 9.0  MG 1.7 2.1 2.1  --   --   PHOS  --   --  3.7  --   --     GFR Estimated Creatinine Clearance: 63.5 mL/min (by C-G formula based on SCr of 1.19 mg/dL). Liver Function Tests: Recent Labs  Lab 03/03/22 0414 03/04/22 0941 03/05/22 0433  AST 45* 47* 36  ALT 54* 53* 43  ALKPHOS 128* 137* 122  BILITOT 1.8* 1.1 0.8  PROT 6.7 7.3 6.6  ALBUMIN 3.8 4.1 3.6    No results for input(s): "LIPASE", "AMYLASE" in the last 168 hours. No results for input(s): "AMMONIA" in the last 168 hours. Coagulation profile No results for input(s): "INR", "PROTIME" in the last 168 hours. COVID-19 Labs  No results for input(s): "DDIMER", "FERRITIN", "LDH", "CRP" in the last 72 hours.   Lab Results  Component Value Date   SARSCOV2NAA NEGATIVE 10/01/2021   SARSCOV2NAA NEGATIVE 06/23/2021    CBC: Recent Labs  Lab 03/02/22 2018 03/03/22 0414 03/04/22 0941 03/05/22 0433  WBC 8.0 7.4 5.6 5.1  HGB 13.6 12.7* 14.1 13.8  HCT 42.7 39.3 43.8 45.3  MCV 89.1 88.3 88.7 91.9  PLT 327 331 386 350    Cardiac Enzymes: Recent Labs  Lab 03/05/22 0433  CKTOTAL 118    BNP (last 3 results) No results for input(s): "PROBNP" in the last 8760 hours. CBG: No results for input(s): "GLUCAP" in the last 168 hours. D-Dimer: No results for input(s): "DDIMER" in the last 72 hours. Hgb A1c: No results for input(s): "HGBA1C" in the last 72 hours. Lipid Profile: No results for input(s): "CHOL", "HDL", "LDLCALC", "TRIG", "CHOLHDL",  "LDLDIRECT" in the last 72 hours. Thyroid function studies: No results for input(s): "TSH", "T4TOTAL", "T3FREE", "THYROIDAB" in the last 72 hours.  Invalid input(s): "FREET3" Anemia work up: No results for input(s): "VITAMINB12", "FOLATE", "FERRITIN", "TIBC", "IRON", "RETICCTPCT" in the last 72 hours. Sepsis Labs: Recent Labs  Lab 03/02/22 2018 03/03/22 0414 03/04/22 0941 03/05/22 0433  WBC 8.0 7.4 5.6 5.1    Microbiology Recent Results (from the past 240 hour(s))  Culture, body fluid w Gram Stain-bottle     Status: None (Preliminary result)   Collection Time: 03/03/22  3:49 PM   Specimen: Pleura  Result Value Ref Range Status   Specimen Description PLEURAL  Final   Special Requests RIGHT  Final   Culture  Final    NO GROWTH 4 DAYS Performed at [facility], [address] [place], [place] [address]    Report Status PENDING  Incomplete  Gram stain     Status: None   Collection Time: 03/03/22  3:49 PM   Specimen: Pleura  Result Value Ref Range Status   Specimen Description PLEURAL  Final   Special Requests NONE  Final   Gram Stain   Final    WBC PRESENT, PREDOMINANTLY MONONUCLEAR NO ORGANISMS SEEN CYTOSPIN SMEAR Performed at [facility], [address], [place], [place] [address]    Report Status 03/03/2022 FINAL  Final     Medications:    atorvastatin  40 mg Oral Daily   dapagliflozin propanediol  10 mg Oral QAC breakfast   diclofenac Sodium  2 g Topical QID   enoxaparin (LOVENOX) injection  40 mg Subcutaneous Q24H   furosemide  60 mg Intravenous Q12H   furosemide  60 mg Oral BID   gabapentin  300 mg Oral TID   metoprolol succinate  75 mg Oral Daily   sodium chloride flush  3 mL Intravenous Q12H   spironolactone  25 mg Oral Daily   tamsulosin  0.4 mg Oral Daily   Continuous Infusions:    LOS: 3 days   Marinda Elk  [facility]  03/07/2022, 8:34 AM

## 2022-03-08 ENCOUNTER — Other Ambulatory Visit (HOSPITAL_COMMUNITY): Payer: Self-pay

## 2022-03-08 DIAGNOSIS — I5043 Acute on chronic combined systolic (congestive) and diastolic (congestive) heart failure: Secondary | ICD-10-CM | POA: Diagnosis not present

## 2022-03-08 DIAGNOSIS — R072 Precordial pain: Secondary | ICD-10-CM

## 2022-03-08 DIAGNOSIS — I10 Essential (primary) hypertension: Secondary | ICD-10-CM | POA: Diagnosis not present

## 2022-03-08 DIAGNOSIS — Z789 Other specified health status: Secondary | ICD-10-CM | POA: Diagnosis not present

## 2022-03-08 LAB — BASIC METABOLIC PANEL
Anion gap: 10 (ref 5–15)
BUN: 30 mg/dL — ABNORMAL HIGH (ref 6–20)
CO2: 26 mmol/L (ref 22–32)
Calcium: 9.4 mg/dL (ref 8.9–10.3)
Chloride: 102 mmol/L (ref 98–111)
Creatinine, Ser: 1.14 mg/dL (ref 0.61–1.24)
GFR, Estimated: >60 mL/min (ref >60)
Glucose, Bld: 95 mg/dL (ref 70–99)
Potassium: 4.1 mmol/L (ref 3.5–5.1)
Sodium: 138 mmol/L (ref 135–145)

## 2022-03-08 LAB — CULTURE, BODY FLUID W GRAM STAIN -BOTTLE: Culture: NO GROWTH

## 2022-03-08 MED ORDER — METOPROLOL SUCCINATE ER 25 MG PO TB24
75.0000 mg | ORAL_TABLET | Freq: Every day | ORAL | 3 refills | Status: DC
  Filled 2022-03-08: qty 30, 10d supply, fill #0

## 2022-03-08 NOTE — Progress Notes (Signed)
Discharge instructions provided to and reviewed with patient.  Patient verbalized understanding.  Bus pass delivered to patient.  Patient escorted to main entrance with belongings and medications (delivered from pharmacy) for transport home via public transport.  Bradd Burner, RN

## 2022-03-08 NOTE — TOC Transition Note (Signed)
Transition of Care [facility]) - CM/SW Discharge Note   Patient Details  Name: Daryl [name] MRN: [id number] Date of Birth: 17-Apr-1961  [facility] CM/SW Contact:  Darleene Cleaver, LCSW Phone Number: 03/08/2022, 7:14 PM   Clinical Narrative:     CSW was informed that patient needs a bus pass.  CSW provided bus pass for patient, he is aware that no home health agency will accept him.  CSW signing off, please reconsult if other social work needs arise.   Final next level of care: Home/Self Care Barriers to Discharge: Barriers Resolved   Patient Goals and CMS Choice Patient states their goals for this hospitalization and ongoing recovery are:: To return back home. CMS Medicare.gov Compare Post Acute Care list provided to:: Patient Choice offered to / list presented to : Patient  Discharge Placement                       Discharge Plan and Services   Discharge Planning Services: CM Consult Post Acute Care Choice: Home Health                    HH Arranged: Disease Management HH Agency: [facility] Date [facility] Agency Contacted: 03/04/22 Time HH Agency Contacted: 1027 Representative spoke with at [facility] Agency: Morrie Sheldon  Social Determinants of Health (SDOH) Interventions     Readmission Risk Interventions    10/02/2021   10:01 AM  Readmission Risk Prevention Plan  Transportation Screening Complete  Medication Review [employer]) Complete  PCP or Specialist appointment within 3-5 days of discharge Complete  HRI or Home Care Consult Complete  SW Recovery Care/Counseling Consult Complete  Palliative Care Screening Not Applicable  Skilled Nursing Facility Not Applicable

## 2022-03-08 NOTE — Discharge Summary (Addendum)
Physician Discharge Summary  Daryl [name] [id number] DOB: 1961-02-13 DOA: 03/02/2022  PCP: Patient, No Pcp Per  Admit date: 03/02/2022 Discharge date: 03/08/2022  Admitted From: Home Disposition:  Home  Recommendations for Outpatient Follow-up:  Follow up with Cardiology in 1-2 weeks Please obtain BMP/CBC in one week   Home Health:No Equipment/Devices:None  Discharge Condition:Stable CODE STATUS:Full Diet recommendation: Heart Healthy Brief/Interim Summary: 61 y.o. male past medical history of combined systolic and diastolic heart failure, essential hypertension alcohol abuse polysubstance abuse and noncompliance comes in with 2 days history of left-sided chest pain and upper back pain accompanied by shortness of breath, was found to be in acute decompensated heart failure, 2D echo on 03/04/2022 showed an EF of 25% global hypokinesia grade 3 diastolic heart failure.  With moderately reduced right ventricular function dilated right ventricle with elevated pulmonary arterial pressure.  Cardiology has evaluated him in the past but has had intermittent office follow-up he related he has a very poor prognosis due to his limited understanding of his disease, marginal social support and adherence.  He has refused Lasix as an outpatient.  Discharge Diagnoses:  Principal Problem:   Acute on chronic combined systolic and diastolic CHF (congestive heart failure) (HCC) Active Problems:   Tobacco use disorder   Essential hypertension   Pleural effusion due to CHF (congestive heart failure) (HCC)   Left-sided chest wall pain   Hypokalemia   Alcohol use   Valvular heart disease  Acute on chronic combined systolic and diastolic heart failure/nonischemic cardiomyopathy: Sign likely due to noncompliance with his medication alcohol abuse and now follow-up with cardiology. He was started on IV diuresis he diuresed around 3 L. He was changed to oral Lasix he will continue to take Aldactone metoprolol  and Farxiga as an outpatient. Follow-up with cardiology in 1 week.  Valvular heart disease: Follow-up with cardiology as an outpatient.  Moderate right-sided pleural effusion: Likely due to acute decompensated heart failure. Status post thoracocentesis on 03/03/2022 yielded 1 L of amber fluid.  Acute kidney injury likely due to overdiuresis: It was held and he returned to baseline.  Musculoskeletal chest pain wall: Reproducible by palpation symmetric in both extremities CT C-spine showed moderate foraminal stenosis C7-T1. Continue Voltaren and Neurontin.  Electrolyte imbalance: Repleted now resolved.  Tobacco abuse: Counseling.  Alcohol abuse/elevated FTs: Counseled, the elevation in the LFTs was multifactorial likely due to alcohol and passive congestion.  Noncompliant with his medication and outpatient follow-up: He has been counseled.  Discharge Instructions  Discharge Instructions     Diet - low sodium heart healthy   Complete by: As directed    Increase activity slowly   Complete by: As directed       Allergies as of 03/08/2022       Reactions   Penicillins Swelling   Did it involve swelling of the face/tongue/throat, SOB, or low BP? Yes Did it involve sudden or severe rash/hives, skin peeling, or any reaction on the inside of your mouth or nose? No Did you need to seek medical attention at a hospital or doctor's office? Yes When did it last happen?    young adult   If all above answers are "NO", may proceed with cephalosporin use.        Medication List     STOP taking these medications    naproxen 500 MG tablet Commonly known as: NAPROSYN   potassium chloride SA 20 MEQ tablet Commonly known as: KLOR-CON M       TAKE  these medications    atorvastatin 40 MG tablet Commonly known as: LIPITOR Take 1 tablet (40 mg total) by mouth daily.   Farxiga 10 MG Tabs tablet Generic drug: dapagliflozin propanediol Take 1 tablet (10 mg total) by mouth  daily before breakfast.   gabapentin 300 MG capsule Commonly known as: NEURONTIN Take 1 capsule (300 mg total) by mouth 3 (three) times daily.   metoprolol succinate 25 MG 24 hr tablet Commonly known as: TOPROL-XL Take 3 tablets (75 mg total) by mouth daily. Start taking on: March 09, 2022 What changed: how much to take   spironolactone 25 MG tablet Commonly known as: ALDACTONE Take 1 tablet (25 mg total) by mouth daily.   tamsulosin 0.4 MG Caps capsule Commonly known as: FLOMAX Take 1 capsule (0.4 mg total) by mouth daily.   torsemide 20 MG tablet Commonly known as: DEMADEX Take 2 tablets (40 mg total) by mouth 2 (two) times daily.        Allergies  Allergen Reactions   Penicillins Swelling    Did it involve swelling of the face/tongue/throat, SOB, or low BP? Yes Did it involve sudden or severe rash/hives, skin peeling, or any reaction on the inside of your mouth or nose? No Did you need to seek medical attention at a hospital or doctor's office? Yes When did it last happen?    young adult   If all above answers are "NO", may proceed with cephalosporin use.     Consultations: None   Procedures/Studies: ECHOCARDIOGRAM COMPLETE  Result Date: 03/04/2022    ECHOCARDIOGRAM REPORT   Patient Name:   Daryl [name] Date of Exam: 03/04/2022 Medical Rec #:  [id number]    Height:       69.0 in Accession #:    [id number]   Weight:       152.7 lb Date of Birth:  1960/12/23    BSA:          1.842 m Patient Age:    64 years     BP:           102/70 mmHg Patient Gender: M            HR:           78 bpm. Exam Location:  Inpatient Procedure: 2D Echo, Color Doppler and Cardiac Doppler Indications:    Congestive heart failure  History:        Patient has prior history of Echocardiogram examinations, most                 recent 10/01/2021. CHF, Signs/Symptoms:Chest Pain and Shortness of                 Breath; Risk Factors:Hypertension. ETOH and tobacco abuse.  Sonographer:    Joette Catching RCS  Referring Phys: [phone] TAYE T GONFA IMPRESSIONS  1. Left ventricular ejection fraction, by estimation, is 25 to 30%. The left ventricle has severely decreased function. The left ventricle demonstrates global hypokinesis. The left ventricular internal cavity size was moderately dilated. Left ventricular diastolic parameters are consistent with Grade III diastolic dysfunction (restrictive). Elevated left atrial pressure.  2. Right ventricular systolic function is moderately reduced. The right ventricular size is severely enlarged. There is mildly elevated pulmonary artery systolic pressure. The estimated right ventricular systolic pressure is 123XX123 mmHg.  3. Left atrial size was severely dilated.  4. Right atrial size was severely dilated.  5. The mitral valve is normal in structure. Moderate to severe mitral valve  regurgitation.  6. Tricuspid valve regurgitation is moderate to severe.  7. The aortic valve is tricuspid. Aortic valve regurgitation is not visualized. No aortic stenosis is present.  8. The inferior vena cava is normal in size with <50% respiratory variability, suggesting right atrial pressure of 8 mmHg. Comparison(s): No significant change from prior study. Prior images reviewed side by side. FINDINGS  Left Ventricle: Left ventricular ejection fraction, by estimation, is 25 to 30%. The left ventricle has severely decreased function. The left ventricle demonstrates global hypokinesis. The left ventricular internal cavity size was moderately dilated. There is no left ventricular hypertrophy. Left ventricular diastolic parameters are consistent with Grade III diastolic dysfunction (restrictive). Elevated left atrial pressure. Right Ventricle: The right ventricular size is severely enlarged. No increase in right ventricular wall thickness. Right ventricular systolic function is moderately reduced. There is mildly elevated pulmonary artery systolic pressure. The tricuspid regurgitant velocity is 3.00 m/s, and  with an assumed right atrial pressure of 8 mmHg, the estimated right ventricular systolic pressure is 123XX123 mmHg. Left Atrium: Left atrial size was severely dilated. Right Atrium: Right atrial size was severely dilated. Pericardium: There is no evidence of pericardial effusion. Mitral Valve: The mitral valve is normal in structure. Moderate to severe mitral valve regurgitation, with centrally-directed jet. Tricuspid Valve: The tricuspid valve is normal in structure. Tricuspid valve regurgitation is moderate to severe. Aortic Valve: The aortic valve is tricuspid. Aortic valve regurgitation is not visualized. No aortic stenosis is present. Aortic valve mean gradient measures 3.0 mmHg. Aortic valve peak gradient measures 4.5 mmHg. Aortic valve area, by VTI measures 2.29 cm. Pulmonic Valve: The pulmonic valve was normal in structure. Pulmonic valve regurgitation is not visualized. Aorta: The aortic root and ascending aorta are structurally normal, with no evidence of dilitation. Venous: The inferior vena cava is normal in size with less than 50% respiratory variability, suggesting right atrial pressure of 8 mmHg. IAS/Shunts: There is right bowing of the interatrial septum, suggestive of elevated left atrial pressure. No atrial level shunt detected by color flow Doppler.  LEFT VENTRICLE PLAX 2D LVIDd:         6.30 cm      Diastology LVIDs:         5.90 cm      LV e' medial:    5.57 cm/s LV PW:         1.00 cm      LV E/e' medial:  18.7 LV IVS:        0.80 cm      LV e' lateral:   9.96 cm/s LVOT diam:     2.00 cm      LV E/e' lateral: 10.4 LV SV:         39 LV SV Index:   21 LVOT Area:     3.14 cm  LV Volumes (MOD) LV vol d, MOD A2C: 142.0 ml LV vol d, MOD A4C: 161.0 ml LV vol s, MOD A2C: 95.6 ml LV vol s, MOD A4C: 134.0 ml LV SV MOD A2C:     46.4 ml LV SV MOD A4C:     161.0 ml LV SV MOD BP:      42.0 ml RIGHT VENTRICLE            IVC RV Basal diam:  5.40 cm    IVC diam: 2.00 cm RV Mid diam:    4.10 cm RV S prime:      9.96 cm/s TAPSE (M-mode): 1.5 cm LEFT ATRIUM  Index        RIGHT ATRIUM           Index LA diam:        4.90 cm 2.66 cm/m   RA Area:     29.40 cm LA Vol (A2C):   62.7 ml 34.04 ml/m  RA Volume:   111.00 ml 60.26 ml/m LA Vol (A4C):   85.0 ml 46.14 ml/m LA Biplane Vol: 77.9 ml 42.29 ml/m  AORTIC VALVE                    PULMONIC VALVE AV Area (Vmax):    2.46 cm     PV Vmax:       0.90 m/s AV Area (Vmean):   2.10 cm     PV Peak grad:  3.2 mmHg AV Area (VTI):     2.29 cm AV Vmax:           106.00 cm/s AV Vmean:          85.400 cm/s AV VTI:            0.170 m AV Peak Grad:      4.5 mmHg AV Mean Grad:      3.0 mmHg LVOT Vmax:         82.90 cm/s LVOT Vmean:        57.100 cm/s LVOT VTI:          0.124 m LVOT/AV VTI ratio: 0.73  AORTA Ao Root diam: 2.80 cm MITRAL VALVE                  TRICUSPID VALVE MV Area (PHT): 7.99 cm       TR Peak grad:   36.0 mmHg MV Decel Time: 95 msec        TR Vmax:        300.00 cm/s MR Peak grad:    71.6 mmHg MR Mean grad:    49.0 mmHg    SHUNTS MR Vmax:         423.00 cm/s  Systemic VTI:  0.12 m MR Vmean:        333.0 cm/s   Systemic Diam: 2.00 cm MR PISA:         6.28 cm MR PISA Eff ROA: 40 mm MR PISA Radius:  1.00 cm MV E velocity: 104.00 cm/s MV A velocity: 61.30 cm/s MV E/A ratio:  1.70 Mihai Croitoru MD Electronically signed by Thurmon Fair MD Signature Date/Time: 03/04/2022/12:29:16 PM    Final    CT CERVICAL SPINE WO CONTRAST  Result Date: 03/03/2022 CLINICAL DATA:  Cervical radiculopathy without red flag symptoms EXAM: CT CERVICAL SPINE WITHOUT CONTRAST TECHNIQUE: Multidetector CT imaging of the cervical spine was performed without intravenous contrast. Multiplanar CT image reconstructions were also generated. RADIATION DOSE REDUCTION: This exam was performed according to the departmental dose-optimization program which includes automated exposure control, adjustment of the mA and/or kV according to patient size and/or use of iterative reconstruction  technique. COMPARISON:  None Available. FINDINGS: Alignment: Straightening of cervical lordosis. Skull base and vertebrae: No acute fracture. No primary bone lesion or focal pathologic process. Soft tissues and spinal canal: No prevertebral fluid or swelling. No visible canal hematoma. Disc levels: C2-3: Unremarkable. C3-4: Disc narrowing with endplate and uncovertebral ridging eccentric to the left where there is mild or moderate foraminal narrowing. C4-5: Disc space narrowing.  No bony impingement C5-6: Disc collapse with primarily ventral endplate ridging. Asymmetric left uncovertebral ridging with mild or  moderate left foraminal narrowing C6-7: Disc narrowing and ventral spondylitic spurring. Asymmetric right uncovertebral ridging with mild right foraminal narrowing. C7-T1:Disc narrowing with uncovertebral ridging eccentric to the left where there is moderate foraminal stenosis Upper chest: No acute finding IMPRESSION: 1. 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 2. No bony spinal canal stenosis. Electronically Signed   By: Tiburcio Pea M.D.   On: 03/03/2022 21:59   DG Chest 1 View  Result Date: 03/03/2022 CLINICAL DATA:  Status post thoracentesis.  1 L off of right lung. EXAM: CHEST  1 VIEW COMPARISON:  Chest two views 03/02/2022 and CT chest 03/03/2022 FINDINGS: Interval decrease in now minimal right pleural effusion compared to the prior moderate right pleural effusion. Mild fluid tracks along the right lower lung developmental Fisher's. The left lung is clear. No pneumothorax. Cardiac silhouette is again mildly enlarged. Mediastinal contours are within normal limits. Minimal levocurvature of the lower thoracic spine. IMPRESSION: Marked improvement in the prior right pleural effusion following thoracentesis. No pneumothorax. Electronically Signed   By: Neita Garnet M.D.   On: 03/03/2022 16:24   US THORACENTESIS ASP PLEURAL SPACE W/IMG  GUIDE  Result Date: 03/03/2022 INDICATION: Patient with history of HTN, CHF complaining of chest pain, back pain and dyspnea found to have moderate right pleural effusion. Request for diagnostic and therapeutic right thoracentesis. EXAM: ULTRASOUND GUIDED DIAGNOSTIC AND THERAPEUTIC RIGHT THORACENTESIS MEDICATIONS: 10 mL 1 % lidocaine COMPLICATIONS: None immediate. PROCEDURE: An ultrasound guided thoracentesis was thoroughly discussed with the patient and questions answered. The benefits, risks, alternatives and complications were also discussed. The patient understands and wishes to proceed with the procedure. Written consent was obtained. Ultrasound was performed to localize and mark an adequate pocket of fluid in the right chest. The area was then prepped and draped in the normal sterile fashion. 1% Lidocaine was used for local anesthesia. Under ultrasound guidance a 6 Fr Safe-T-Centesis catheter was introduced. Thoracentesis was performed. The catheter was removed and a dressing applied. FINDINGS: A total of approximately 1 L of hazy, amber fluid was removed. Samples were sent to the laboratory as requested by the clinical team. IMPRESSION: Successful ultrasound guided right thoracentesis yielding 1 L of pleural fluid. Read by: Alex Gardener, AGNP-BC Electronically Signed   By: Malachy Moan M.D.   On: 03/03/2022 16:17   CT Angio Chest PE W and/or Wo Contrast  Result Date: 03/03/2022 CLINICAL DATA:  Pulmonary embolism suspected, high probability. Chest back and left arm pain. EXAM: CT ANGIOGRAPHY CHEST WITH CONTRAST TECHNIQUE: Multidetector CT imaging of the chest was performed using the standard protocol during bolus administration of intravenous contrast. Multiplanar CT image reconstructions and MIPs were obtained to evaluate the vascular anatomy. RADIATION DOSE REDUCTION: This exam was performed according to the departmental dose-optimization program which includes automated exposure control,  adjustment of the mA and/or kV according to patient size and/or use of iterative reconstruction technique. CONTRAST:  OMNIPAQUE IOHEXOL 350 MG/ML SOLN COMPARISON:  09/13/2018. FINDINGS: Cardiovascular: The heart is enlarged and there is no pericardial effusion. Mild coronary artery calcifications are noted. There is mild atherosclerotic calcification of the aorta without evidence of aneurysm. The pulmonary trunk is normal in caliber. No large central pulmonary artery filling defect is identified. Evaluation of the segmental and subsegmental arteries is limited due to significant respiratory motion artifact. Mediastinum/Nodes: No enlarged mediastinal, hilar, or axillary lymph nodes. Thyroid gland, trachea, and esophagus demonstrate no significant findings. Lungs/Pleura: Mild emphysematous changes are  noted at the lung apices. Mild atelectasis is present bilaterally. There is a moderate right pleural effusion and trace left pleural effusion. No pneumothorax. Upper Abdomen: Reflux of contrast into the hepatic veins and inferior vena cava may be associated with right heart failure. No acute abnormality. Musculoskeletal: Bilateral gynecomastia is noted. No acute osseous abnormality. Review of the MIP images confirms the above findings. IMPRESSION: 1. No central pulmonary artery filling defect. Evaluation of the segmental and subsegmental arteries is limited due to respiratory motion artifact. 2. Moderate right pleural effusion and trace pleural effusion with atelectasis bilaterally. 3. Cardiomegaly with coronary artery calcifications. 4. Reflux of contrast into the hepatic veins and inferior vena cava may be associated with right heart failure. 5. Minimal aortic atherosclerosis. Electronically Signed   By: Brett Fairy M.D.   On: 03/03/2022 00:57   DG Chest 2 View  Result Date: 03/02/2022 CLINICAL DATA:  Chest pain EXAM: CHEST - 2 VIEW COMPARISON:  11/09/2021 FINDINGS: Cardiomegaly. Unchanged, moderate right  pleural effusion associated atelectasis or consolidation. The left lung is normally aerated. Osseous structures unremarkable. IMPRESSION: 1. Cardiomegaly. 2. Unchanged, moderate right pleural effusion and associated atelectasis or consolidation. 3. The left lung is normally aerated. Electronically Signed   By: Delanna Ahmadi M.D.   On: 03/02/2022 19:01   (Echo, Carotid, EGD, Colonoscopy, ERCP)    Subjective: No complaints  Discharge Exam: Vitals:   03/07/22 2105 03/08/22 0505  BP: 103/71 109/82  Pulse: 79 78  Resp: 20 20  Temp: 97.8 F (36.6 C) 98 F (36.7 C)  SpO2: 98% 97%   Vitals:   03/07/22 1056 03/07/22 1221 03/07/22 2105 03/08/22 0505  BP: 112/84 105/67 103/71 109/82  Pulse:  78 79 78  Resp:   20 20  Temp:  97.6 F (36.4 C) 97.8 F (36.6 C) 98 F (36.7 C)  TempSrc:  Oral Oral   SpO2:  99% 98% 97%  Weight:      Height:        General: Pt is alert, awake, not in acute distress Cardiovascular: RRR, S1/S2 +, no rubs, no gallops Respiratory: CTA bilaterally, no wheezing, no rhonchi Abdominal: Soft, NT, ND, bowel sounds + Extremities: no edema, no cyanosis    The results of significant diagnostics from this hospitalization (including imaging, microbiology, ancillary and laboratory) are listed below for reference.     Microbiology: Recent Results (from the past 240 hour(s))  Culture, body fluid w Gram Stain-bottle     Status: None   Collection Time: 03/03/22  3:49 PM   Specimen: Pleura  Result Value Ref Range Status   Specimen Description PLEURAL  Final   Special Requests RIGHT  Final   Culture   Final    NO GROWTH 5 DAYS Performed at [facility], [address] [place], [place] [address]    Report Status 03/08/2022 FINAL  Final  Gram stain     Status: None   Collection Time: 03/03/22  3:49 PM   Specimen: Pleura  Result Value Ref Range Status   Specimen Description PLEURAL  Final   Special Requests NONE  Final   Gram Stain   Final    WBC PRESENT,  PREDOMINANTLY MONONUCLEAR NO ORGANISMS SEEN CYTOSPIN SMEAR Performed at [facility], [place] [address], [place], [place] [address]    Report Status 03/03/2022 FINAL  Final     Labs: BNP (last 3 results) Recent Labs    10/01/21 0119 03/02/22 2017 03/05/22 0433  BNP 3,514.2* 1,850.9*  [id number]*   Basic Metabolic Panel: Recent Labs  Lab 03/03/22 0414 03/04/22 0941 03/05/22 0433 03/06/22 0857 03/07/22 0727 03/08/22 0409  NA 139 140 138 137 134* 138  K 3.2* 3.6 4.0 4.1 3.8 4.1  CL 107 102 102 103 98 102  CO2 21* 28 27 25 26 26   GLUCOSE 152* 107* 136* 101* 97 95  BUN 13 20 19  21* 26* 30*  CREATININE 0.98 1.27* 0.97 1.02 1.19 1.14  CALCIUM 8.8* 9.4 8.8* 9.0 9.0 9.4  MG 1.7 2.1 2.1  --   --   --   PHOS  --   --  3.7  --   --   --    Liver Function Tests: Recent Labs  Lab 03/03/22 0414 03/04/22 0941 03/05/22 0433  AST 45* 47* 36  ALT 54* 53* 43  ALKPHOS 128* 137* 122  BILITOT 1.8* 1.1 0.8  PROT 6.7 7.3 6.6  ALBUMIN 3.8 4.1 3.6   No results for input(s): "LIPASE", "AMYLASE" in the last 168 hours. No results for input(s): "AMMONIA" in the last 168 hours. CBC: Recent Labs  Lab 03/02/22 2018 03/03/22 0414 03/04/22 0941 03/05/22 0433  WBC 8.0 7.4 5.6 5.1  HGB 13.6 12.7* 14.1 13.8  HCT 42.7 39.3 43.8 45.3  MCV 89.1 88.3 88.7 91.9  PLT 327 331 386 350   Cardiac Enzymes: Recent Labs  Lab 03/05/22 0433  CKTOTAL 118   BNP: Invalid input(s): "POCBNP" CBG: No results for input(s): "GLUCAP" in the last 168 hours. D-Dimer No results for input(s): "DDIMER" in the last 72 hours. Hgb A1c No results for input(s): "HGBA1C" in the last 72 hours. Lipid Profile No results for input(s): "CHOL", "HDL", "LDLCALC", "TRIG", "CHOLHDL", "LDLDIRECT" in the last 72 hours. Thyroid function studies No results for input(s): "TSH", "T4TOTAL", "T3FREE", "THYROIDAB" in the last 72 hours.  Invalid input(s): "FREET3" Anemia work up No results for input(s): "VITAMINB12",  "FOLATE", "FERRITIN", "TIBC", "IRON", "RETICCTPCT" in the last 72 hours. Urinalysis    Component Value Date/Time   COLORURINE STRAW (A) 10/01/2021 0330   APPEARANCEUR CLEAR 10/01/2021 0330   LABSPEC 1.005 10/01/2021 0330   PHURINE 6.0 10/01/2021 0330   GLUCOSEU NEGATIVE 10/01/2021 0330   HGBUR NEGATIVE 10/01/2021 0330   BILIRUBINUR NEGATIVE 10/01/2021 0330   KETONESUR NEGATIVE 10/01/2021 0330   PROTEINUR NEGATIVE 10/01/2021 0330   NITRITE NEGATIVE 10/01/2021 0330   LEUKOCYTESUR NEGATIVE 10/01/2021 0330   Sepsis Labs Recent Labs  Lab 03/02/22 2018 03/03/22 0414 03/04/22 0941 03/05/22 0433  WBC 8.0 7.4 5.6 5.1   Microbiology Recent Results (from the past 240 hour(s))  Culture, body fluid w Gram Stain-bottle     Status: None   Collection Time: 03/03/22  3:49 PM   Specimen: Pleura  Result Value Ref Range Status   Specimen Description PLEURAL  Final   Special Requests RIGHT  Final   Culture   Final    NO GROWTH 5 DAYS Performed at [facility], [address], [place], [place] [address]    Report Status 03/08/2022 FINAL  Final  Gram stain     Status: None   Collection Time: 03/03/22  3:49 PM   Specimen: Pleura  Result Value Ref Range Status   Specimen Description PLEURAL  Final   Special Requests NONE  Final   Gram Stain   Final    WBC PRESENT, PREDOMINANTLY MONONUCLEAR NO ORGANISMS SEEN CYTOSPIN SMEAR Performed at [facility], [place] [address], [place], [place] [address]  Report Status 03/03/2022 FINAL  Final     SIGNED:   Marinda Elk, MD  [facility] 03/08/2022, 10:30 AM Pager   If 7PM-7AM, please contact night-coverage [email or web address] Password [email or web address]

## 2022-03-08 NOTE — Plan of Care (Signed)

## 2022-03-18 ENCOUNTER — Telehealth: Payer: Self-pay | Admitting: Cardiology

## 2022-03-18 ENCOUNTER — Telehealth: Payer: Self-pay | Admitting: Licensed Clinical Social Worker

## 2022-03-18 NOTE — Telephone Encounter (Signed)
Patient called stating he was recently in the hospital and they changed his medication he would like to speak to someone.

## 2022-03-18 NOTE — Telephone Encounter (Signed)
Thank you :)

## 2022-03-18 NOTE — Telephone Encounter (Signed)
Called patient back- he wanted to discuss his last hospital visit, and medications.  I seen in visit they recommended a 1-2 week follow up with Cardiology, however he was not scheduled.  I did go ahead and schedule him for the next opening I had for an APP. Patient also has issues with transportation and getting to his appointment.  I advised I would route to our social worker team to see if they could assist.   He is scheduled to come in on 08/04 at 8:25 AM.   Patient verbalized understanding.

## 2022-03-19 NOTE — Telephone Encounter (Signed)
H&V Care Navigation CSW Progress Note  Clinical Social Worker contacted patient by phone to f/u on request for ride to upcoming appt. Reached pt son at first as number listed was for him. There is an additional number provided by pt son [phone]), will add this as pt primary number. Message was left for pt and he returned my call. Introduced self, role, reason for call. Pt confirmed home address, no PCP currently (no showed scheduled appt), and that he wasn't aware he had Medicaid (appears to have been discussed at last appt). We discussed benefits through Medicaid. I also offered to send pt a copy of his benefits, he is okay with me doing so. I remain available as needed moving forward.    Patient is participating in a Managed Medicaid Plan:  Yes ([place] [employer])  SDOH Screenings   Alcohol Screen: Not on file  Depression (RXV4-0): Not on file  Financial Resource Strain: High Risk (10/14/2021)   Overall Financial Resource Strain (CARDIA)    Difficulty of Paying Living Expenses: Hard  Food Insecurity: Food Insecurity Present (10/14/2021)   Hunger Vital Sign    Worried About Running Out of Food in the Last Year: Sometimes true    Ran Out of Food in the Last Year: Sometimes true  Housing: High Risk (10/14/2021)   Housing    Last Housing Risk Score: 2  Physical Activity: Not on file  Social Connections: Not on file  Stress: Not on file  Tobacco Use: High Risk (03/03/2022)   Patient History    Smoking Tobacco Use: Every Day    Smokeless Tobacco Use: Never    Passive Exposure: Not on file  Transportation Needs: Unmet Transportation Needs (03/19/2022)   PRAPARE - Administrator, Civil Service (Medical): Yes    Lack of Transportation (Non-Medical): Yes   Octavio Graves, MSW, LCSW Clinical Social Worker II [facility] Heart/Vascular Care Navigation  [phone]- work cell phone (preferred) [phone]- desk phone

## 2022-03-27 NOTE — Progress Notes (Deleted)
Cardiology Clinic Note   Patient Name: Daryl [name] Date of Encounter: 03/27/2022  Primary Care Provider:  Patient, No Pcp Per Primary Cardiologist:  Rollene Rotunda, MD  Patient Profile    61 y.o. male past medical history of combined systolic and diastolic heart failure, essential hypertension alcohol abuse polysubstance abuse and noncompliance,  EF of 25% global hypokinesia grade 3 diastolic heart failure with moderately reduced right ventricular function dilated right ventricle with elevated pulmonary arterial pressure, he has a very poor prognosis due to his limited understanding of his disease, marginal social support and adherence.  He has refused Lasix as an outpatient. He was recently discharged from [facility] hospital on 03/08/2022 after admission for acute on chronic combined heart failure with non-ischemic cardiomyopathy, moderate right-sided pleural effusion, s/p thoracentesis on 03/03/2022 with 1 liter of fluid removed, AKI in the setting of over diureses, chronic neuralgia pain, hyperlipidemia, and hypertension..   Past Medical History    Past Medical History:  Diagnosis Date   Asthma    Chronic combined systolic and diastolic CHF (congestive heart failure) (HCC)    Dyslipidemia    Essential hypertension 11/02/2018   ETOH abuse 02/14/2020   Nonobstructive CAD    Non-obstructive disease on cardiac cath in 08/2018   NSVT (nonsustained ventricular tachycardia) (HCC)    PNA (pneumonia) 09/11/2018   Sepsis (HCC) 09/11/2018   Stage 3a chronic kidney disease (HCC) 02/19/2020   Tobacco abuse 09/11/2018   Past Surgical History:  Procedure Laterality Date   IR THORACENTESIS ASP PLEURAL SPACE W/IMG GUIDE  09/13/2018   RIGHT/LEFT HEART CATH AND CORONARY ANGIOGRAPHY N/A 09/15/2018   Procedure: RIGHT/LEFT HEART CATH AND CORONARY ANGIOGRAPHY;  Surgeon: Marykay Lex, MD;  Location: [facility] INVASIVE CV LAB;  Service: Cardiovascular;  Laterality: N/A;    Allergies  Allergies  Allergen  Reactions   Penicillins Swelling    Did it involve swelling of the face/tongue/throat, SOB, or low BP? Yes Did it involve sudden or severe rash/hives, skin peeling, or any reaction on the inside of your mouth or nose? No Did you need to seek medical attention at a hospital or doctor's office? Yes When did it last happen?    young adult   If all above answers are "NO", may proceed with cephalosporin use.     History of Present Illness    Daryl [name] comes today status post discharge from [facility] after admission for decompensated combined CHF, right pleural effusion status postthoracentesis, polysubstance abuse, hypertension, and valvular heart disease.  He did have chronically elevated LFTs in the setting of EtOH abuse.  Naproxen sodium was discontinued.  He was to take Farxiga, Aldactone, metoprolol, and p.o. torsemide 40 mg twice daily at home, weight on discharge 68 kg.  Home Medications    Current Outpatient Medications  Medication Sig Dispense Refill   atorvastatin (LIPITOR) 40 MG tablet Take 1 tablet (40 mg total) by mouth daily. 90 tablet 0   dapagliflozin propanediol (FARXIGA) 10 MG TABS tablet Take 1 tablet (10 mg total) by mouth daily before breakfast. 90 tablet 0   gabapentin (NEURONTIN) 300 MG capsule Take 1 capsule (300 mg total) by mouth 3 (three) times daily. 90 capsule 3   metoprolol succinate (TOPROL-XL) 25 MG 24 hr tablet Take 3 tablets (75 mg total) by mouth daily. 30 tablet 3   spironolactone (ALDACTONE) 25 MG tablet Take 1 tablet (25 mg total) by mouth daily. 30 tablet 3   tamsulosin (FLOMAX) 0.4 MG CAPS capsule Take 1 capsule (  0.4 mg total) by mouth daily. 30 capsule 1   torsemide (DEMADEX) 20 MG tablet Take 2 tablets (40 mg total) by mouth 2 (two) times daily. 60 tablet 3   No current facility-administered medications for this visit.     Family History    No family history on file. He indicated that his mother is deceased. He indicated that his father is  deceased. He indicated that his maternal grandmother is deceased. He indicated that his maternal grandfather is deceased. He indicated that his paternal grandmother is deceased. He indicated that his paternal grandfather is deceased.  Social History    Social History   Socioeconomic History   Marital status: Single    Spouse name: Not on file   Number of children: Not on file   Years of education: Not on file   Highest education level: Not on file  Occupational History   Not on file  Tobacco Use   Smoking status: Every Day    Packs/day: 0.50    Types: Cigarettes   Smokeless tobacco: Never  Vaping Use   Vaping Use: Never used  Substance and Sexual Activity   Alcohol use: Yes   Drug use: Never   Sexual activity: Not on file  Other Topics Concern   Not on file  Social History Narrative   Not on file   Social Determinants of Health   Financial Resource Strain: High Risk (10/14/2021)   Overall Financial Resource Strain (CARDIA)    Difficulty of Paying Living Expenses: Hard  Food Insecurity: Food Insecurity Present (10/14/2021)   Hunger Vital Sign    Worried About Running Out of Food in the Last Year: Sometimes true    Ran Out of Food in the Last Year: Sometimes true  Transportation Needs: Unmet Transportation Needs (03/19/2022)   PRAPARE - Administrator, Civil Service (Medical): Yes    Lack of Transportation (Non-Medical): Yes  Physical Activity: Not on file  Stress: Not on file  Social Connections: Not on file  Intimate Partner Violence: Not on file     Review of Systems    General:  No chills, fever, night sweats or weight changes.  Cardiovascular:  No chest pain, dyspnea on exertion, edema, orthopnea, palpitations, paroxysmal nocturnal dyspnea. Dermatological: No rash, lesions/masses Respiratory: No cough, dyspnea Urologic: No hematuria, dysuria Abdominal:   No nausea, vomiting, diarrhea, bright red blood per rectum, melena, or hematemesis Neurologic:   No visual changes, wkns, changes in mental status. All other systems reviewed and are otherwise negative except as noted above.     Physical Exam    VS:  There were no vitals taken for this visit. , BMI There is no height or weight on file to calculate BMI.     GEN: Well nourished, well developed, in no acute distress. HEENT: normal. Neck: Supple, no JVD, carotid bruits, or masses. Cardiac: RRR, no murmurs, rubs, or gallops. No clubbing, cyanosis, edema.  Radials/DP/PT 2+ and equal bilaterally.  Respiratory:  Respirations regular and unlabored, clear to auscultation bilaterally. GI: Soft, nontender, nondistended, BS + x 4. MS: no deformity or atrophy. Skin: warm and dry, no rash. Neuro:  Strength and sensation are intact. Psych: Normal affect.  Accessory Clinical Findings    ECG personally reviewed by me today- *** - No acute changes  Lab Results  Component Value Date   WBC 5.1 03/05/2022   HGB 13.8 03/05/2022   HCT 45.3 03/05/2022   MCV 91.9 03/05/2022   PLT 350 03/05/2022  Lab Results  Component Value Date   CREATININE 1.14 03/08/2022   BUN 30 (H) 03/08/2022   NA 138 03/08/2022   K 4.1 03/08/2022   CL 102 03/08/2022   CO2 26 03/08/2022   Lab Results  Component Value Date   ALT 43 03/05/2022   AST 36 03/05/2022   ALKPHOS 122 03/05/2022   BILITOT 0.8 03/05/2022   Lab Results  Component Value Date   CHOL 127 09/15/2018   HDL 33 (L) 09/15/2018   LDLCALC 69 09/15/2018   TRIG 124 09/15/2018   CHOLHDL 3.8 09/15/2018    No results found for: "HGBA1C"  Review of Prior Studies: Echocardiogram 03/04/2022 1. Left ventricular ejection fraction, by estimation, is 25 to 30%. The  left ventricle has severely decreased function. The left ventricle  demonstrates global hypokinesis. The left ventricular internal cavity size  was moderately dilated. Left  ventricular diastolic parameters are consistent with Grade III diastolic  dysfunction (restrictive). Elevated left  atrial pressure.   2. Right ventricular systolic function is moderately reduced. The right  ventricular size is severely enlarged. There is mildly elevated pulmonary  artery systolic pressure. The estimated right ventricular systolic  pressure is 123XX123 mmHg.   3. Left atrial size was severely dilated.   4. Right atrial size was severely dilated.   5. The mitral valve is normal in structure. Moderate to severe mitral  valve regurgitation.   6. Tricuspid valve regurgitation is moderate to severe.   7. The aortic valve is tricuspid. Aortic valve regurgitation is not  visualized. No aortic stenosis is present.   8. The inferior vena cava is normal in size with <50% respiratory  variability, suggesting right atrial pressure of 8 mmHg.   LHC 09/15/2020 Hemodynamic findings consistent with mild pulmonary hypertension. LV end diastolic pressure is mildly elevated. Prox RCA to Mid RCA lesion is 15% stenosed. Mid RCA lesion is 20% stenosed. Prox LAD lesion is 20% stenosed. Dist RCA lesion is 20% stenosed with 30% stenosed side branch in Ost RPDA.   SUMMARY Angiographic evidence of coronary spasm relieved with nitroglycerin in the mid and distal RCA otherwise minimal disease in the LAD. NONISCHEMIC CARDIOMYOPATHY Evidence of elevated right-sided filling pressures with an RAP of 14 mmHg and RVEDP of 17 million mercury consistent with LVEDP of 17-20 mmHg.    Assessment & Plan   1.  ***    Current medicines are reviewed at length with the patient today.  I have spent *** min's  dedicated to the care of this patient on the date of this encounter to include pre-visit review of records, assessment, management and diagnostic testing,with shared decision making. Signed, Phill Myron. West Pugh, ANP, AACC   03/27/2022 11:06 AM      Office [phone] Fax [phone]  Notice: This dictation was prepared with Dragon dictation along with smaller phrase technology. Any transcriptional errors  that result from this process are unintentional and may not be corrected upon review.

## 2022-03-28 ENCOUNTER — Ambulatory Visit: Payer: Medicaid Other | Admitting: Adult Health

## 2022-04-07 ENCOUNTER — Encounter: Payer: Self-pay | Admitting: Adult Health

## 2022-04-12 ENCOUNTER — Emergency Department (HOSPITAL_COMMUNITY)
Admission: EM | Admit: 2022-04-12 | Discharge: 2022-04-12 | Disposition: A | Discharge status: Home / Self Care | Payer: Medicaid Other | Attending: Emergency Medicine | Admitting: Emergency Medicine

## 2022-04-12 ENCOUNTER — Encounter (HOSPITAL_COMMUNITY): Payer: Self-pay

## 2022-04-12 ENCOUNTER — Emergency Department (HOSPITAL_COMMUNITY): Payer: Medicaid Other

## 2022-04-12 DIAGNOSIS — I509 Heart failure, unspecified: Secondary | ICD-10-CM | POA: Insufficient documentation

## 2022-04-12 DIAGNOSIS — R0602 Shortness of breath: Secondary | ICD-10-CM | POA: Insufficient documentation

## 2022-04-12 DIAGNOSIS — Z5321 Procedure and treatment not carried out due to patient leaving prior to being seen by health care provider: Secondary | ICD-10-CM | POA: Diagnosis not present

## 2022-04-12 NOTE — ED Triage Notes (Signed)
Pt reports he has had sob for several years. Pt states the sob has not changed. Denies any pain or other symptoms. Pt states, "I just want to be checked out."

## 2022-04-12 NOTE — ED Provider Triage Note (Signed)
Emergency Medicine Provider Triage Evaluation Note  Daryl [name] , a 61 y.o. male  was evaluated in triage.  Pt complains of shortness of breath for several years. No change in symptoms. Wants reassurance that everything is okay. Had a recent admission for CHF.  Review of Systems  Positive:  Negative:   Physical Exam  BP 121/70 (BP Location: Right Arm)   Pulse 77   Temp 98.3 F (36.8 C) (Oral)   Resp 16   SpO2 99%  Gen:   Awake, no distress   Resp:  Normal effort  MSK:   Moves extremities without difficulty  Other:    Medical Decision Making  Medically screening exam initiated at 11:36 AM.  Appropriate orders placed.  [name] was informed that the remainder of the evaluation will be completed by another provider, this initial triage assessment does not replace that evaluation, and the importance of remaining in the ED until their evaluation is complete.     Claudie Leach, [place] 04/12/22 1911

## 2022-04-17 ENCOUNTER — Other Ambulatory Visit: Payer: Self-pay

## 2022-04-17 ENCOUNTER — Emergency Department (HOSPITAL_COMMUNITY): Payer: Medicaid Other

## 2022-04-17 ENCOUNTER — Inpatient Hospital Stay (HOSPITAL_COMMUNITY)
Admission: EM | Admit: 2022-04-17 | Discharge: 2022-04-19 | DRG: 291 | Disposition: A | Discharge status: Home / Self Care | Payer: Medicaid Other | Attending: Internal Medicine | Admitting: Internal Medicine

## 2022-04-17 ENCOUNTER — Encounter (HOSPITAL_COMMUNITY): Payer: Self-pay

## 2022-04-17 DIAGNOSIS — I2510 Atherosclerotic heart disease of native coronary artery without angina pectoris: Secondary | ICD-10-CM | POA: Diagnosis present

## 2022-04-17 DIAGNOSIS — E785 Hyperlipidemia, unspecified: Secondary | ICD-10-CM | POA: Diagnosis present

## 2022-04-17 DIAGNOSIS — Z79899 Other long term (current) drug therapy: Secondary | ICD-10-CM | POA: Diagnosis not present

## 2022-04-17 DIAGNOSIS — F17210 Nicotine dependence, cigarettes, uncomplicated: Secondary | ICD-10-CM | POA: Diagnosis present

## 2022-04-17 DIAGNOSIS — N1831 Chronic kidney disease, stage 3a: Secondary | ICD-10-CM | POA: Diagnosis present

## 2022-04-17 DIAGNOSIS — Z880 Allergy status to penicillin: Secondary | ICD-10-CM

## 2022-04-17 DIAGNOSIS — I509 Heart failure, unspecified: Secondary | ICD-10-CM

## 2022-04-17 DIAGNOSIS — G629 Polyneuropathy, unspecified: Secondary | ICD-10-CM | POA: Diagnosis present

## 2022-04-17 DIAGNOSIS — F17200 Nicotine dependence, unspecified, uncomplicated: Secondary | ICD-10-CM | POA: Diagnosis present

## 2022-04-17 DIAGNOSIS — Z91148 Patient's other noncompliance with medication regimen for other reason: Secondary | ICD-10-CM

## 2022-04-17 DIAGNOSIS — I5041 Acute combined systolic (congestive) and diastolic (congestive) heart failure: Secondary | ICD-10-CM | POA: Diagnosis not present

## 2022-04-17 DIAGNOSIS — J449 Chronic obstructive pulmonary disease, unspecified: Secondary | ICD-10-CM | POA: Diagnosis present

## 2022-04-17 DIAGNOSIS — I130 Hypertensive heart and chronic kidney disease with heart failure and stage 1 through stage 4 chronic kidney disease, or unspecified chronic kidney disease: Secondary | ICD-10-CM | POA: Diagnosis not present

## 2022-04-17 DIAGNOSIS — F10231 Alcohol dependence with withdrawal delirium: Secondary | ICD-10-CM | POA: Diagnosis present

## 2022-04-17 DIAGNOSIS — N400 Enlarged prostate without lower urinary tract symptoms: Secondary | ICD-10-CM | POA: Diagnosis present

## 2022-04-17 DIAGNOSIS — I5043 Acute on chronic combined systolic (congestive) and diastolic (congestive) heart failure: Secondary | ICD-10-CM | POA: Diagnosis present

## 2022-04-17 DIAGNOSIS — F10931 Alcohol use, unspecified with withdrawal delirium: Secondary | ICD-10-CM | POA: Diagnosis present

## 2022-04-17 LAB — COMPREHENSIVE METABOLIC PANEL
ALT: 29 U/L (ref 0–44)
AST: 30 U/L (ref 15–41)
Albumin: 3.9 g/dL (ref 3.5–5.0)
Alkaline Phosphatase: 113 U/L (ref 38–126)
Anion gap: 6 (ref 5–15)
BUN: 8 mg/dL (ref 6–20)
CO2: 25 mmol/L (ref 22–32)
Calcium: 9.2 mg/dL (ref 8.9–10.3)
Chloride: 110 mmol/L (ref 98–111)
Creatinine, Ser: 0.97 mg/dL (ref 0.61–1.24)
GFR, Estimated: >60 mL/min (ref >60)
Glucose, Bld: 105 mg/dL — ABNORMAL HIGH (ref 70–99)
Potassium: 3.6 mmol/L (ref 3.5–5.1)
Sodium: 141 mmol/L (ref 135–145)
Total Bilirubin: 0.6 mg/dL (ref 0.3–1.2)
Total Protein: 6.9 g/dL (ref 6.5–8.1)

## 2022-04-17 LAB — CBC WITH DIFFERENTIAL/PLATELET
Abs Immature Granulocytes: 0.01 10*3/uL (ref 0.00–0.07)
Basophils Absolute: 0 10*3/uL (ref 0.0–0.1)
Basophils Relative: 1 %
Eosinophils Absolute: 0.2 10*3/uL (ref 0.0–0.5)
Eosinophils Relative: 3 %
HCT: 42.7 % (ref 39.0–52.0)
Hemoglobin: 13.6 g/dL (ref 13.0–17.0)
Immature Granulocytes: 0 %
Lymphocytes Relative: 45 %
Lymphs Abs: 2.7 10*3/uL (ref 0.7–4.0)
MCH: 27.9 pg (ref 26.0–34.0)
MCHC: 31.9 g/dL (ref 30.0–36.0)
MCV: 87.5 fL (ref 80.0–100.0)
Monocytes Absolute: 0.5 10*3/uL (ref 0.1–1.0)
Monocytes Relative: 9 %
Neutro Abs: 2.5 10*3/uL (ref 1.7–7.7)
Neutrophils Relative %: 42 %
Platelets: 324 10*3/uL (ref 150–400)
RBC: 4.88 MIL/uL (ref 4.22–5.81)
RDW: 13.8 % (ref 11.5–15.5)
WBC: 5.9 10*3/uL (ref 4.0–10.5)
nRBC: 0 % (ref 0.0–0.2)

## 2022-04-17 LAB — BRAIN NATRIURETIC PEPTIDE: B Natriuretic Peptide: 1350.8 pg/mL — ABNORMAL HIGH (ref 0.0–100.0)

## 2022-04-17 LAB — TROPONIN I (HIGH SENSITIVITY)
Troponin I (High Sensitivity): 18 ng/L — ABNORMAL HIGH (ref <18)
Troponin I (High Sensitivity): 17 ng/L (ref <18)

## 2022-04-17 MED ORDER — FUROSEMIDE 10 MG/ML IJ SOLN
40.0000 mg | Freq: Once | INTRAMUSCULAR | Status: AC
  Administered 2022-04-17: 40 mg via INTRAVENOUS
  Filled 2022-04-17: qty 4

## 2022-04-17 NOTE — ED Provider Notes (Signed)
[facility]EMERGENCY DEPT Provider Note   CSN: [id number] Arrival date & time: 04/17/22  0626     History  Chief Complaint  Patient presents with   Cough    Daryl [name] is a 61 y.o. male.  past medical history of combined systolic and diastolic heart failure, essential hypertension alcohol abuse polysubstance abuse and noncompliance comes in shortness of breath worsening for the past several days.  He states has been short of breath "ever since I came for the first time" but is gotten progressively worse.  Complains of dry cough productive of white mucus, increased work of breathing and increased swelling in his legs and abdomen.  Has not had his medications for greater than a month as they were misplaced.  States he has had increased orthopnea and dyspnea with exertion but no chest pain but feeling tight.  No fever.  No abdominal pain, nausea or vomiting.  Denies any alcohol or drug use.  Ejection fraction was 25 to 30% on last evaluation in July. Contrary to triage note he denies any chest pain.  He denies any abdominal pain or vomiting.  Also complains of numbness and tingling involving his left hand fourth and fifth digits extending up his arm that has been ongoing for several weeks.  No weakness in the hand.  The history is provided by the patient.  Cough      Home Medications Prior to Admission medications   Medication Sig Start Date End Date Taking? Authorizing Provider  atorvastatin (LIPITOR) 40 MG tablet Take 1 tablet (40 mg total) by mouth daily. 03/07/22 06/05/22  Marinda Elk, MD  dapagliflozin propanediol (FARXIGA) 10 MG TABS tablet Take 1 tablet (10 mg total) by mouth daily before breakfast. 03/07/22   Marinda Elk, MD  gabapentin (NEURONTIN) 300 MG capsule Take 1 capsule (300 mg total) by mouth 3 (three) times daily. 03/07/22   Marinda Elk, MD  metoprolol succinate (TOPROL-XL) 25 MG 24 hr tablet Take 3 tablets (75 mg total)  by mouth daily. 03/09/22   Marinda Elk, MD  spironolactone (ALDACTONE) 25 MG tablet Take 1 tablet (25 mg total) by mouth daily. 03/08/22   Marinda Elk, MD  tamsulosin (FLOMAX) 0.4 MG CAPS capsule Take 1 capsule (0.4 mg total) by mouth daily. 03/07/22   Marinda Elk, MD  torsemide (DEMADEX) 20 MG tablet Take 2 tablets (40 mg total) by mouth 2 (two) times daily. 03/07/22 06/05/22  Marinda Elk, MD      Allergies    Penicillins    Review of Systems   Review of Systems  Respiratory:  Positive for cough.    all other systems are negative except as noted in the HPI and PMH.    Physical Exam Updated Vital Signs BP (!) 153/112   Pulse (!) 119   Temp 98.6 F (37 C) (Oral)   Resp 20   Ht 5\' 9"  (1.753 m)   Wt 68 kg   SpO2 96%   BMI 22.15 kg/m  Physical Exam Vitals and nursing note reviewed.  Constitutional:      General: He is not in acute distress.    Appearance: He is well-developed.     Comments: Speaking in short phrases, no distress  HENT:     Head: Normocephalic and atraumatic.     Mouth/Throat:     Pharynx: No oropharyngeal exudate.  Eyes:     Conjunctiva/sclera: Conjunctivae normal.     Pupils: Pupils are equal,  round, and reactive to light.  Neck:     Comments: No meningismus. Cardiovascular:     Rate and Rhythm: Regular rhythm. Tachycardia present.     Heart sounds: Normal heart sounds. No murmur heard. Pulmonary:     Effort: Pulmonary effort is normal. No respiratory distress.     Breath sounds: Rales present.     Comments: Diminished breath sounds on the right. Abdominal:     Palpations: Abdomen is soft.     Tenderness: There is no abdominal tenderness. There is no guarding or rebound.  Musculoskeletal:        General: No tenderness. Normal range of motion.     Cervical back: Normal range of motion and neck supple.     Right lower leg: Edema present.     Left lower leg: Edema present.  Skin:    General: Skin is warm.   Neurological:     Mental Status: He is alert and oriented to person, place, and time.     Cranial Nerves: No cranial nerve deficit.     Motor: No abnormal muscle tone.     Coordination: Coordination normal.     Comments:  5/5 strength throughout. CN 2-12 intact.Equal grip strength.   Psychiatric:        Behavior: Behavior normal.     ED Results / Procedures / Treatments   Labs (all labs ordered are listed, but only abnormal results are displayed) Labs Reviewed  COMPREHENSIVE METABOLIC PANEL - Abnormal; Notable for the following components:      Result Value   Glucose, Bld 105 (*)    All other components within normal limits  BRAIN NATRIURETIC PEPTIDE - Abnormal; Notable for the following components:   B Natriuretic Peptide 1,350.8 (*)    All other components within normal limits  TROPONIN I (HIGH SENSITIVITY) - Abnormal; Notable for the following components:   Troponin I (High Sensitivity) 18 (*)    All other components within normal limits  CBC WITH DIFFERENTIAL/PLATELET  CBC  CREATININE, SERUM  RAPID URINE DRUG SCREEN, HOSP PERFORMED  TROPONIN I (HIGH SENSITIVITY)    EKG EKG Interpretation  Date/Time:  Thursday April 17 2022 19:34:43 EDT Ventricular Rate:  99 PR Interval:  152 QRS Duration: 76 QT Interval:  375 QTC Calculation: 482 R Axis:   57 Text Interpretation: Sinus rhythm Probable left atrial enlargement Nonspecific repol abnormality, diffuse leads No significant change was found Confirmed by Glynn Octave [phone]) on 04/17/2022 9:01:21 PM  Radiology DG Chest 2 View  Result Date: 04/17/2022 CLINICAL DATA:  Shortness of breath.  Cough. EXAM: CHEST - 2 VIEW COMPARISON:  Chest radiograph 04/12/2022.  CT 03/03/2022 FINDINGS: Right pleural effusion is increased in size from prior radiograph, at least moderate in size. There is minimal fluid in the minor fissures. Associated volume loss in the right lung base consistent with compressive atelectasis. Similar  cardiomegaly with unchanged mediastinal contours. Similar peribronchial thickening. No frank pulmonary edema. No pneumothorax. IMPRESSION: Increased size of right pleural effusion, at least moderate in size. Associated volume loss in the right lung base consistent with compressive atelectasis. Electronically Signed   By: Narda Rutherford M.D.   On: 04/17/2022 19:51    Procedures Procedures    Medications Ordered in ED Medications  furosemide (LASIX) injection 40 mg (has no administration in time range)    ED Course/ Medical Decision Making/ A&P  Medical Decision Making Amount and/or Complexity of Data Reviewed Labs: ordered. Decision-making details documented in ED Course. Radiology: ordered and independent interpretation performed. Decision-making details documented in ED Course. ECG/medicine tests: ordered and independent interpretation performed. Decision-making details documented in ED Course.  Risk Prescription drug management. Decision regarding hospitalization.  Worsening shortness of breath over the past 5 days, noncompliance with medications including diuretics.  Tachycardic and tachypneic on arrival but not hypoxic.  EKG without acute ischemia.  Patient given IV Lasix.  Chest x-ray shows right-sided pleural effusion progressed from previous. Results reviewed and interpreted by me.  BNP elevated. Creatinine at baseline. Increased work of breathing with hypoxia to low 90s with ambulation.   Significantly volume overloaded likely due to noncompliance. Recurrent pleural effusion which may need paracentesis.   Admission for diuresis d/w Dr. Margo Aye.        Final Clinical Impression(s) / ED Diagnoses Final diagnoses:  Acute on chronic combined systolic and diastolic congestive heart failure [facility])    Rx / DC Orders ED Discharge Orders     None         Davarious Tumbleson, Jeannett Senior, MD 04/18/22 [phone]

## 2022-04-17 NOTE — ED Triage Notes (Signed)
SHOB for several days. Has had some issues for the last 5 days since previous visit.   Admits coughing up white sputum.   Also admits left arm numbness for 5 days.   Alert, oriented, and ambulatory.

## 2022-04-17 NOTE — H&P (Signed)
History and Physical  ROSSER [name] [id number] DOB: 06-28-1961 DOA: 04/17/2022  Referring physician: Dr. Wyvonnia Dusky, [place]  PCP: Patient, No Pcp Per  Outpatient Specialists: Cardiology Patient coming from: Home.  Chief Complaint: Cough and shortness of breath  HPI: Daryl [name] is a 61 y.o. male with medical history significant for chronic combined diastolic and systolic CHF, hyperlipidemia, BPH, polyneuropathy, former alcohol and cocaine user, COPD, who presented to [facility] ED from home with complaints of worsening nonproductive cough and shortness of breath of 5 days duration.  Associated with bilateral lower extremity edema.  The patient has not had his cardiac medications for about a month.  States they were taken from him.  Denies chest pain.  No subjective fevers or chills.  Presented to the ED for further evaluation.    Work-up in the ED revealed acute CHF exacerbation.  He was started on IV diuresing.  EDP requested admission for further management.  The patient was admitted by [facility], hospitalist service.  ED Course: Tmax 98.6.  BP 115/95, pulse 87, respiratory 17, with saturation 97% on room air.  Lab studies remarkable for BNP greater than 1300 from about 660, 1 month ago.  Review of Systems: Review of systems as noted in the HPI. All other systems reviewed and are negative.   Past Medical History:  Diagnosis Date   Asthma    Chronic combined systolic and diastolic CHF (congestive heart failure) ([place])    Dyslipidemia    Essential hypertension 11/02/2018   ETOH abuse 02/14/2020   Nonobstructive CAD    Non-obstructive disease on cardiac cath in 08/2018   NSVT (nonsustained ventricular tachycardia) (HCC)    PNA (pneumonia) 09/11/2018   Sepsis ([place]) 09/11/2018   Stage 3a chronic kidney disease ([place]) 02/19/2020   Tobacco abuse 09/11/2018   Past Surgical History:  Procedure Laterality Date   IR THORACENTESIS ASP PLEURAL SPACE W/IMG GUIDE  09/13/2018   RIGHT/LEFT HEART CATH AND  CORONARY ANGIOGRAPHY N/A 09/15/2018   Procedure: RIGHT/LEFT HEART CATH AND CORONARY ANGIOGRAPHY;  Surgeon: Leonie Man, MD;  Location: [place] [facility];  Service: Cardiovascular;  Laterality: N/A;    Social History:  reports that he has been smoking cigarettes. He has been smoking an average of .5 packs per day. He has never used smokeless tobacco. He reports current alcohol use. He reports that he does not use drugs.   Allergies  Allergen Reactions   Penicillins Swelling    Did it involve swelling of the face/tongue/throat, SOB, or low BP? Yes Did it involve sudden or severe rash/hives, skin peeling, or any reaction on the inside of your mouth or nose? No Did you need to seek medical attention at a hospital or doctor's office? Yes When did it last happen?    young adult   If all above answers are "NO", may proceed with cephalosporin use.     Family history: Unknown  Prior to Admission medications   Medication Sig Start Date End Date Taking? Authorizing Provider  atorvastatin (LIPITOR) 40 MG tablet Take 1 tablet (40 mg total) by mouth daily. 03/07/22 06/05/22 Yes Charlynne Cousins, MD  dapagliflozin propanediol (FARXIGA) 10 MG TABS tablet Take 1 tablet (10 mg total) by mouth daily before breakfast. 03/07/22  Yes Charlynne Cousins, MD  gabapentin (NEURONTIN) 300 MG capsule Take 1 capsule (300 mg total) by mouth 3 (three) times daily. 03/07/22  Yes Charlynne Cousins, MD  metoprolol succinate (TOPROL-XL) 25 MG 24 hr tablet Take 3 tablets (75  mg total) by mouth daily. Patient taking differently: Take 12.5 mg by mouth daily. 03/09/22  Yes Marinda Elk, MD  spironolactone (ALDACTONE) 25 MG tablet Take 1 tablet (25 mg total) by mouth daily. 03/08/22  Yes Marinda Elk, MD  tamsulosin (FLOMAX) 0.4 MG CAPS capsule Take 1 capsule (0.4 mg total) by mouth daily. 03/07/22  Yes Marinda Elk, MD  torsemide (DEMADEX) 20 MG tablet Take 2 tablets (40 mg total) by mouth 2  (two) times daily. 03/07/22 06/05/22 Yes Marinda Elk, MD    Physical Exam: BP (!) 144/106   Pulse (!) 115   Temp 98 F (36.7 C)   Resp 20   Ht 5\' 9"  (1.753 m)   Wt 68 kg   SpO2 91%   BMI 22.15 kg/m   General: 61 y.o. year-old male well developed well nourished in no acute distress.  Alert and oriented x3. Cardiovascular: Regular rate and rhythm with no rubs or gallops.  No thyromegaly or JVD noted.  Trace lower extremity edema bilaterally. Respiratory: Mild rales noted at bases.  Poor inspiratory effort. Abdomen: Soft nontender nondistended with normal bowel sounds x4 quadrants. Muskuloskeletal: No cyanosis or clubbing.  Trace edema noted in lower extremities bilaterally Neuro: CN II-XII intact, strength, sensation, reflexes Skin: No ulcerative lesions noted or rashes Psychiatry: Judgement and insight appear normal. Mood is appropriate for condition and setting          Labs on Admission:  Basic Metabolic Panel: Recent Labs  Lab 04/17/22 1922  NA 141  K 3.6  CL 110  CO2 25  GLUCOSE 105*  BUN 8  CREATININE 0.97  CALCIUM 9.2   Liver Function Tests: Recent Labs  Lab 04/17/22 1922  AST 30  ALT 29  ALKPHOS 113  BILITOT 0.6  PROT 6.9  ALBUMIN 3.9   No results for input(s): "LIPASE", "AMYLASE" in the last 168 hours. No results for input(s): "AMMONIA" in the last 168 hours. CBC: Recent Labs  Lab 04/17/22 1922  WBC 5.9  NEUTROABS 2.5  HGB 13.6  HCT 42.7  MCV 87.5  PLT 324   Cardiac Enzymes: No results for input(s): "CKTOTAL", "CKMB", "CKMBINDEX", "TROPONINI" in the last 168 hours.  BNP (last 3 results) Recent Labs    03/02/22 2017 03/05/22 0433 04/17/22 1923  BNP 1,850.9* 661.0* 1,350.8*    ProBNP (last 3 results) No results for input(s): "PROBNP" in the last 8760 hours.  CBG: No results for input(s): "GLUCAP" in the last 168 hours.  Radiological Exams on Admission: DG Chest 2 View  Result Date: 04/17/2022 CLINICAL DATA:  Shortness  of breath.  Cough. EXAM: CHEST - 2 VIEW COMPARISON:  Chest radiograph 04/12/2022.  CT 03/03/2022 FINDINGS: Right pleural effusion is increased in size from prior radiograph, at least moderate in size. There is minimal fluid in the minor fissures. Associated volume loss in the right lung base consistent with compressive atelectasis. Similar cardiomegaly with unchanged mediastinal contours. Similar peribronchial thickening. No frank pulmonary edema. No pneumothorax. IMPRESSION: Increased size of right pleural effusion, at least moderate in size. Associated volume loss in the right lung base consistent with compressive atelectasis. Electronically Signed   By: 05/04/2022 M.D.   On: 04/17/2022 19:51    EKG: I independently viewed the EKG done and my findings are as followed: Sinus rhythm rate of 99.  Nonspecific ST-T changes.  QTc 482.  Assessment/Plan Present on Admission: **None**  Principal Problem:   Acute CHF (congestive heart failure) (HCC)  Acute  on chronic combined diastolic and systolic CHF Presented with dyspnea, bilateral lower extremity edema, BNP greater than 1300 from about 660, 1 month ago. Noncompliance with cardiac medications IV diuresis started in the ED Monitor strict I's and O's and daily weight Last 2D echo done on 03/04/2022 revealed LVEF 25 to 30% with grade 3 diastolic dysfunction. Resume home cardiac medications. Consult cardiology in the morning  Moderate right pleural effusion, suspect cardiogenic Likely contributing to dyspnea Incentive spirometer Consider thoracentesis if remains symptomatic despite IV diuresing Currently on ambient air, maintaining oxygen saturation greater than 92%  Hypertension BP stable Resume home cardiac medications.  Hyperlipidemia Resume home Lipitor  BPH Resume home Flomax  Social issues TOC consulted to assist with cardiac medications.  Medication noncompliance Counseled on the importance of medication compliance, the  patient was receptive.   DVT prophylaxis: Subcu Lovenox daily  Code Status: Full code  Family Communication: None at bedside  Disposition Plan: Admitted to progressive care unit  Consults called: None.  Admission status: Inpatient status.   Status is: Inpatient The patient requires at least 2 midnights for further evaluation and treatment of present condition.   Darlin Drop MD [facility] Pager [phone]  If 7PM-7AM, please contact night-coverage [email or web address] Password [facility]  04/17/2022, 11:07 PM

## 2022-04-17 NOTE — ED Provider Triage Note (Signed)
Emergency Medicine Provider Triage Evaluation Note  Daryl [name] , a 61 y.o. male with history of CHF, CKD, hypertension, polysubstance abuse was evaluated in triage.  Pt complains of dry cough, shortness of breath and pleuritic chest pain that has been worsening over the last week or so.  He was hospitalized in July for CHF exacerbation with a new ejection fraction of 25%.  Patient denies swelling in the abdomen and the legs.  He is not sure if this feels like similar CHF exacerbations.  Patient is a poor historian and history is difficult  Review of Systems  Positive:  Negative: Per HPI  Physical Exam  BP 134/88 (BP Location: Left Arm)   Pulse (!) 108   Temp 98.6 F (37 C) (Oral)   Resp 20   Ht 5\' 9"  (1.753 m)   Wt 68 kg   SpO2 99%   BMI 22.15 kg/m  Gen:   Awake, no distress   Resp:  Normal effort, lungs CTA MSK:   Moves extremities without difficulty  Other:  No swelling of the lower extremities noted  Medical Decision Making  Medically screening exam initiated at 7:27 PM.  Appropriate orders placed.  was informed that the remainder of the evaluation will be completed by another provider, this initial triage assessment does not replace that evaluation, and the importance of remaining in the ED until their evaluation is complete.     [name], Janell Quiet 04/17/22 1930

## 2022-04-17 NOTE — ED Notes (Signed)
Pt would not sit still while attempting to obtain blood work. Informed pt his labs would be drawn in the back.

## 2022-04-18 DIAGNOSIS — I5041 Acute combined systolic (congestive) and diastolic (congestive) heart failure: Secondary | ICD-10-CM | POA: Diagnosis not present

## 2022-04-18 DIAGNOSIS — I509 Heart failure, unspecified: Secondary | ICD-10-CM

## 2022-04-18 LAB — CREATININE, SERUM
Creatinine, Ser: 1.02 mg/dL (ref 0.61–1.24)
GFR, Estimated: >60 mL/min (ref >60)

## 2022-04-18 LAB — CBC
HCT: 40 % (ref 39.0–52.0)
Hemoglobin: 12.9 g/dL — ABNORMAL LOW (ref 13.0–17.0)
MCH: 28.1 pg (ref 26.0–34.0)
MCHC: 32.3 g/dL (ref 30.0–36.0)
MCV: 87.1 fL (ref 80.0–100.0)
Platelets: 302 10*3/uL (ref 150–400)
RBC: 4.59 MIL/uL (ref 4.22–5.81)
RDW: 13.9 % (ref 11.5–15.5)
WBC: 8.3 10*3/uL (ref 4.0–10.5)
nRBC: 0 % (ref 0.0–0.2)

## 2022-04-18 LAB — RAPID URINE DRUG SCREEN, HOSP PERFORMED
Amphetamines: NOT DETECTED
Barbiturates: NOT DETECTED
Benzodiazepines: NOT DETECTED
Cocaine: NOT DETECTED
Opiates: NOT DETECTED
Tetrahydrocannabinol: NOT DETECTED

## 2022-04-18 MED ORDER — ENSURE ENLIVE PO LIQD
237.0000 mL | Freq: Two times a day (BID) | ORAL | Status: DC
  Administered 2022-04-18 – 2022-04-19 (×2): 237 mL via ORAL

## 2022-04-18 MED ORDER — FOLIC ACID 1 MG PO TABS
1.0000 mg | ORAL_TABLET | Freq: Every day | ORAL | Status: DC
  Administered 2022-04-18 – 2022-04-19 (×2): 1 mg via ORAL
  Filled 2022-04-18 (×2): qty 1

## 2022-04-18 MED ORDER — MELATONIN 5 MG PO TABS
5.0000 mg | ORAL_TABLET | Freq: Every evening | ORAL | Status: DC | PRN
  Administered 2022-04-18: 5 mg via ORAL
  Filled 2022-04-18: qty 1

## 2022-04-18 MED ORDER — LORAZEPAM 2 MG/ML IJ SOLN: 1.0000 mg | INTRAMUSCULAR | Status: DC | PRN

## 2022-04-18 MED ORDER — POLYETHYLENE GLYCOL 3350 17 G PO PACK: 17.0000 g | PACK | Freq: Every day | ORAL | Status: DC | PRN

## 2022-04-18 MED ORDER — ADULT MULTIVITAMIN W/MINERALS CH
1.0000 | ORAL_TABLET | Freq: Every day | ORAL | Status: DC
  Administered 2022-04-18 – 2022-04-19 (×2): 1 via ORAL
  Filled 2022-04-18 (×2): qty 1

## 2022-04-18 MED ORDER — ENOXAPARIN SODIUM 40 MG/0.4ML IJ SOSY
40.0000 mg | PREFILLED_SYRINGE | INTRAMUSCULAR | Status: DC
  Administered 2022-04-18: 40 mg via SUBCUTANEOUS
  Filled 2022-04-18 (×2): qty 0.4

## 2022-04-18 MED ORDER — TAMSULOSIN HCL 0.4 MG PO CAPS
0.4000 mg | ORAL_CAPSULE | Freq: Every day | ORAL | Status: DC
  Administered 2022-04-18 – 2022-04-19 (×2): 0.4 mg via ORAL
  Filled 2022-04-18 (×2): qty 1

## 2022-04-18 MED ORDER — SPIRONOLACTONE 25 MG PO TABS
25.0000 mg | ORAL_TABLET | Freq: Every day | ORAL | Status: DC
  Administered 2022-04-19: 25 mg via ORAL
  Filled 2022-04-18 (×2): qty 1

## 2022-04-18 MED ORDER — METOPROLOL SUCCINATE ER 25 MG PO TB24
12.5000 mg | ORAL_TABLET | Freq: Every day | ORAL | Status: DC
  Administered 2022-04-19: 12.5 mg via ORAL
  Filled 2022-04-18: qty 0.5
  Filled 2022-04-18: qty 1

## 2022-04-18 MED ORDER — PROCHLORPERAZINE EDISYLATE 10 MG/2ML IJ SOLN: 10.0000 mg | Freq: Four times a day (QID) | INTRAMUSCULAR | Status: DC | PRN

## 2022-04-18 MED ORDER — LORAZEPAM 1 MG PO TABS: 1.0000 mg | ORAL_TABLET | ORAL | Status: DC | PRN

## 2022-04-18 MED ORDER — DAPAGLIFLOZIN PROPANEDIOL 10 MG PO TABS
10.0000 mg | ORAL_TABLET | Freq: Every day | ORAL | Status: DC
  Administered 2022-04-19: 10 mg via ORAL
  Filled 2022-04-18: qty 1

## 2022-04-18 MED ORDER — THIAMINE HCL 100 MG PO TABS
100.0000 mg | ORAL_TABLET | Freq: Every day | ORAL | Status: DC
  Administered 2022-04-18 – 2022-04-19 (×2): 100 mg via ORAL
  Filled 2022-04-18 (×2): qty 1

## 2022-04-18 MED ORDER — ATORVASTATIN CALCIUM 40 MG PO TABS
40.0000 mg | ORAL_TABLET | Freq: Every day | ORAL | Status: DC
  Administered 2022-04-18 – 2022-04-19 (×2): 40 mg via ORAL
  Filled 2022-04-18 (×2): qty 1

## 2022-04-18 MED ORDER — THIAMINE HCL 100 MG/ML IJ SOLN: 100.0000 mg | Freq: Every day | INTRAMUSCULAR | Status: DC

## 2022-04-18 MED ORDER — GABAPENTIN 300 MG PO CAPS
300.0000 mg | ORAL_CAPSULE | Freq: Three times a day (TID) | ORAL | Status: DC
  Administered 2022-04-18 – 2022-04-19 (×4): 300 mg via ORAL
  Filled 2022-04-18 (×4): qty 1

## 2022-04-18 MED ORDER — ACETAMINOPHEN 325 MG PO TABS: 650.0000 mg | ORAL_TABLET | Freq: Four times a day (QID) | ORAL | Status: DC | PRN

## 2022-04-18 MED ORDER — FUROSEMIDE 10 MG/ML IJ SOLN
40.0000 mg | Freq: Two times a day (BID) | INTRAMUSCULAR | Status: DC
  Administered 2022-04-18 – 2022-04-19 (×4): 40 mg via INTRAVENOUS
  Filled 2022-04-18 (×4): qty 4

## 2022-04-18 NOTE — Progress Notes (Signed)
PROGRESS NOTE    Daryl [name]  DJM:[id number] DOB: 1961/07/10 DOA: 04/17/2022 PCP: Patient, No Pcp Per    Brief Narrative:  Daryl [name] is a 61 y.o. male with medical history significant for chronic combined diastolic and systolic CHF, hyperlipidemia, BPH, polyneuropathy, former alcohol and cocaine user, COPD, who presented to [facility] ED from home with complaints of worsening nonproductive cough and shortness of breath of 5 days duration.  Associated with bilateral lower extremity edema.  The patient has not had his cardiac medications for about a month.  States they were taken from him.  Denies chest pain.  No subjective fevers or chills.  Presented to the ED for further evaluation.     Work-up in the ED revealed acute CHF exacerbation.  He was started on IV diuresing.  EDP requested admission for further management.  The patient was admitted by [facility], hospitalist service.   Assessment and Plan: Acute on chronic combined diastolic and systolic CHF Presented with dyspnea, bilateral lower extremity edema, BNP greater than 1300 from about 660, 1 month ago. Noncompliance with cardiac medications IV diuresis started in the ED Monitor strict I's and O's and daily weight- not being done Last 2D echo done on 03/04/2022 revealed LVEF 25 to 30% with grade 3 diastolic dysfunction. Resume home cardiac medications.   Moderate right pleural effusion, suspect cardiogenic Likely contributing to dyspnea Incentive spirometer Consider thoracentesis if remains symptomatic despite IV diuresing Currently on ambient air, maintaining oxygen saturation greater than 92%   Hypertension BP stable Resume home cardiac medications.   Hyperlipidemia Resume home Lipitor   BPH Resume home Flomax   Social issues TOC consulted to assist with cardiac medications.   Medication noncompliance Counseled on the importance of medication compliance  DVT prophylaxis: enoxaparin (LOVENOX) injection 40 mg Start: 04/18/22  1000    Code Status: Full Code Family Communication:   Disposition Plan:  Level of care: Telemetry Status is: Inpatient Remains inpatient appropriate because: needs diuresis    Consultants:  none   Subjective: Doesn't want to talk about living situation  Objective: Vitals:   04/18/22 0343 04/18/22 0530 04/18/22 0754 04/18/22 0906  BP:  (!) 138/102  (!) 142/93  Pulse:  91  89  Resp:  20  19  Temp: (!) 97.5 F (36.4 C)  97.9 F (36.6 C) 97.9 F (36.6 C)  TempSrc: Oral  Oral   SpO2:  95%  95%  Weight:      Height:       No intake or output data in the 24 hours ending 04/18/22 1122 Filed Weights   04/17/22 1909  Weight: 68 kg    Examination:   General: Appearance:    Well developed, well nourished male in no acute distress, laying almost flat     Lungs:     respirations unlabored  Heart:    Normal heart rate. Normal rhythm. No murmurs, rubs, or gallops.    MS:   All extremities are intact.    Neurologic:   Awake, alert, oriented x 3. No apparent focal neurological           defect.        Data Reviewed: I have personally reviewed following labs and imaging studies  CBC: Recent Labs  Lab 04/17/22 1922 04/18/22 0129  WBC 5.9 8.3  NEUTROABS 2.5  --   HGB 13.6 12.9*  HCT 42.7 40.0  MCV 87.5 87.1  PLT 324 302   Basic Metabolic Panel: Recent Labs  Lab  04/17/22 1922 04/18/22 0129  NA 141  --   K 3.6  --   CL 110  --   CO2 25  --   GLUCOSE 105*  --   BUN 8  --   CREATININE 0.97 1.02  CALCIUM 9.2  --    GFR: Estimated Creatinine Clearance: 74.1 mL/min (by C-G formula based on SCr of 1.02 mg/dL). Liver Function Tests: Recent Labs  Lab 04/17/22 1922  AST 30  ALT 29  ALKPHOS 113  BILITOT 0.6  PROT 6.9  ALBUMIN 3.9   No results for input(s): "LIPASE", "AMYLASE" in the last 168 hours. No results for input(s): "AMMONIA" in the last 168 hours. Coagulation Profile: No results for input(s): "INR", "PROTIME" in the last 168 hours. Cardiac  Enzymes: No results for input(s): "CKTOTAL", "CKMB", "CKMBINDEX", "TROPONINI" in the last 168 hours. BNP (last 3 results) No results for input(s): "PROBNP" in the last 8760 hours. HbA1C: No results for input(s): "HGBA1C" in the last 72 hours. CBG: No results for input(s): "GLUCAP" in the last 168 hours. Lipid Profile: No results for input(s): "CHOL", "HDL", "LDLCALC", "TRIG", "CHOLHDL", "LDLDIRECT" in the last 72 hours. Thyroid Function Tests: No results for input(s): "TSH", "T4TOTAL", "FREET4", "T3FREE", "THYROIDAB" in the last 72 hours. Anemia Panel: No results for input(s): "VITAMINB12", "FOLATE", "FERRITIN", "TIBC", "IRON", "RETICCTPCT" in the last 72 hours. Sepsis Labs: No results for input(s): "PROCALCITON", "LATICACIDVEN" in the last 168 hours.  No results found for this or any previous visit (from the past 240 hour(s)).       Radiology Studies: DG Chest 2 View  Result Date: 04/17/2022 CLINICAL DATA:  Shortness of breath.  Cough. EXAM: CHEST - 2 VIEW COMPARISON:  Chest radiograph 04/12/2022.  CT 03/03/2022 FINDINGS: Right pleural effusion is increased in size from prior radiograph, at least moderate in size. There is minimal fluid in the minor fissures. Associated volume loss in the right lung base consistent with compressive atelectasis. Similar cardiomegaly with unchanged mediastinal contours. Similar peribronchial thickening. No frank pulmonary edema. No pneumothorax. IMPRESSION: Increased size of right pleural effusion, at least moderate in size. Associated volume loss in the right lung base consistent with compressive atelectasis. Electronically Signed   By: Narda Rutherford M.D.   On: 04/17/2022 19:51        Scheduled Meds:  atorvastatin  40 mg Oral Daily   [START ON 04/19/2022] dapagliflozin propanediol  10 mg Oral QAC breakfast   enoxaparin (LOVENOX) injection  40 mg Subcutaneous Q24H   folic acid  1 mg Oral Daily   furosemide  40 mg Intravenous BID   gabapentin   300 mg Oral TID   metoprolol succinate  12.5 mg Oral Daily   multivitamin with minerals  1 tablet Oral Daily   spironolactone  25 mg Oral Daily   tamsulosin  0.4 mg Oral Daily   thiamine  100 mg Oral Daily   Or   thiamine  100 mg Intravenous Daily   Continuous Infusions:   LOS: 1 day    Time spent: 45 minutes spent on chart review, discussion with nursing staff, consultants, updating family and interview/physical exam; more than 50% of that time was spent in counseling and/or coordination of care.    Joseph Art, DO [facility] Available via Epic secure chat 7am-7pm After these hours, please refer to coverage provider listed on [email or web address] 04/18/2022, 11:22 AM

## 2022-04-18 NOTE — Progress Notes (Signed)
Introduced myself to pt and explained that I needed to do the nursing admission history. Pt answered questions until I started asking about housing. When asked if he lives in a private department -He says "What kind of questions are these?  Mark everything no." Unable to complete the entire nursing admission hx.  Darcel Smalling BSN, RN-BC Throughput Nurse 04/18/2022 2:37 PM

## 2022-04-19 ENCOUNTER — Other Ambulatory Visit (HOSPITAL_COMMUNITY): Payer: Self-pay

## 2022-04-19 DIAGNOSIS — I5041 Acute combined systolic (congestive) and diastolic (congestive) heart failure: Secondary | ICD-10-CM | POA: Diagnosis not present

## 2022-04-19 LAB — BASIC METABOLIC PANEL
Anion gap: 8 (ref 5–15)
BUN: 16 mg/dL (ref 6–20)
CO2: 27 mmol/L (ref 22–32)
Calcium: 8.6 mg/dL — ABNORMAL LOW (ref 8.9–10.3)
Chloride: 104 mmol/L (ref 98–111)
Creatinine, Ser: 0.89 mg/dL (ref 0.61–1.24)
GFR, Estimated: >60 mL/min (ref >60)
Glucose, Bld: 110 mg/dL — ABNORMAL HIGH (ref 70–99)
Potassium: 3.6 mmol/L (ref 3.5–5.1)
Sodium: 139 mmol/L (ref 135–145)

## 2022-04-19 MED ORDER — ORAL CARE MOUTH RINSE: 15.0000 mL | OROMUCOSAL | Status: DC | PRN

## 2022-04-19 MED ORDER — TORSEMIDE 20 MG PO TABS
40.0000 mg | ORAL_TABLET | Freq: Two times a day (BID) | ORAL | Status: DC
  Administered 2022-04-19: 40 mg via ORAL
  Filled 2022-04-19: qty 2

## 2022-04-19 MED ORDER — ATORVASTATIN CALCIUM 40 MG PO TABS
40.0000 mg | ORAL_TABLET | Freq: Every day | ORAL | 0 refills | Status: DC
Start: 2022-04-19 — End: 2022-05-17
  Filled 2022-04-19 (×2): qty 90, 90d supply, fill #0

## 2022-04-19 MED ORDER — DAPAGLIFLOZIN PROPANEDIOL 10 MG PO TABS
10.0000 mg | ORAL_TABLET | Freq: Every day | ORAL | 0 refills | Status: DC
  Filled 2022-04-19 (×3): qty 90, 90d supply, fill #0

## 2022-04-19 MED ORDER — METOPROLOL SUCCINATE ER 25 MG PO TB24
12.5000 mg | ORAL_TABLET | Freq: Every day | ORAL | 0 refills | Status: DC
  Filled 2022-04-19 (×2): qty 45, 90d supply, fill #0

## 2022-04-19 MED ORDER — SPIRONOLACTONE 25 MG PO TABS
25.0000 mg | ORAL_TABLET | Freq: Every day | ORAL | 0 refills | Status: DC
  Filled 2022-04-19: qty 90, 90d supply, fill #0

## 2022-04-19 MED ORDER — TORSEMIDE 40 MG PO TABS
40.0000 mg | ORAL_TABLET | Freq: Two times a day (BID) | ORAL | 0 refills | Status: DC
  Filled 2022-04-19 (×2): qty 60, 30d supply, fill #0

## 2022-04-19 MED ORDER — GABAPENTIN 300 MG PO CAPS
300.0000 mg | ORAL_CAPSULE | Freq: Three times a day (TID) | ORAL | 0 refills | Status: DC
  Filled 2022-04-19: qty 90, 30d supply, fill #0

## 2022-04-19 MED ORDER — TAMSULOSIN HCL 0.4 MG PO CAPS
0.4000 mg | ORAL_CAPSULE | Freq: Every day | ORAL | 0 refills | Status: DC
  Filled 2022-04-19: qty 90, 90d supply, fill #0

## 2022-04-19 NOTE — Discharge Instructions (Signed)
Resources for in-Pt as well out Pt.

## 2022-04-19 NOTE — Discharge Summary (Signed)
Physician Discharge Summary  Daryl [name] [id number] DOB: Aug 25, 1961 DOA: 04/17/2022  PCP: Patient, No Pcp Per  Admit date: 04/17/2022 Discharge date: 04/19/2022  Admitted From: home Discharge disposition: home   Recommendations for Outpatient Follow-Up:   Unfortunately patient not compliant with meds-- says his meds were lost/stolen-- have refilled all meds- suspect he will continue to bounce in and out of the hospital Placed referral to cards to be seen as he missed an appointment earlier this month   Discharge Diagnosis:   Principal Problem:   Acute CHF (congestive heart failure) (Daryl [name]) Active Problems:   Alcohol withdrawal delirium (Daryl [name])   Tobacco use disorder   Acute exacerbation of CHF (congestive heart failure) (Daryl [name])    Discharge Condition: Improved.  Diet recommendation: Low sodium, heart healthy..  Wound care: None.  Code status: Full.   History of Present Illness:   Daryl [name] is a 61 y.o. male with medical history significant for chronic combined diastolic and systolic CHF, hyperlipidemia, BPH, polyneuropathy, former alcohol and cocaine user, COPD, who presented to [facility] ED from home with complaints of worsening nonproductive cough and shortness of breath of 5 days duration.  Associated with bilateral lower extremity edema.  The patient has not had his cardiac medications for about a month.     Hospital Course by Problem:   Acute on chronic combined diastolic and systolic CHF Presented with dyspnea, bilateral lower extremity edema, BNP greater than 1300 from about 660, 1 month ago. Noncompliance with cardiac medications IV diuresis started in the ED-- change to PO-- able to lay flat Monitor strict I's and O's and daily weight- not being done Last 2D echo done on 03/04/2022 revealed LVEF 25 to 30% with grade 3 diastolic dysfunction. Resume home cardiac medications.   Moderate right pleural effusion, suspect cardiogenic Likely contributing to  dyspnea Incentive spirometer O2 sat stable -follow up outpatient to ensure resolution   Hypertension BP stable Resume home cardiac medications.   Hyperlipidemia Resume home Lipitor   BPH Resume home Flomax   Social issues TOC consulted to assist with cardiac medications.   Medication noncompliance Counseled on the importance of medication compliance    Medical Consultants:   TOC   Discharge Exam:   Vitals:   04/19/22 0406 04/19/22 0600  BP: 95/72   Pulse: 75   Resp: 18 17  Temp: 98.1 F (36.7 C)   SpO2: 96%    Vitals:   04/19/22 0400 04/19/22 0406 04/19/22 0500 04/19/22 0600  BP:  95/72    Pulse:  75    Resp: 15 18  17   Temp:  98.1 F (36.7 C)    TempSrc:  Oral    SpO2:  96%    Weight:   70.1 kg   Height:        General exam: Appears calm and comfortable.    The results of significant diagnostics from this hospitalization (including imaging, microbiology, ancillary and laboratory) are listed below for reference.     Procedures and Diagnostic Studies:   DG Chest 2 View  Result Date: 04/17/2022 CLINICAL DATA:  Shortness of breath.  Cough. EXAM: CHEST - 2 VIEW COMPARISON:  Chest radiograph 04/12/2022.  CT 03/03/2022 FINDINGS: Right pleural effusion is increased in size from prior radiograph, at least moderate in size. There is minimal fluid in the minor fissures. Associated volume loss in the right lung base consistent with compressive atelectasis. Similar cardiomegaly with unchanged mediastinal contours. Similar peribronchial thickening. No frank pulmonary  edema. No pneumothorax. IMPRESSION: Increased size of right pleural effusion, at least moderate in size. Associated volume loss in the right lung base consistent with compressive atelectasis. Electronically Signed   By: Narda Rutherford M.D.   On: 04/17/2022 19:51     Labs:   Basic Metabolic Panel: Recent Labs  Lab 04/17/22 1922 04/18/22 0129 04/19/22 0547  NA 141  --  139  K 3.6  --  3.6   CL 110  --  104  CO2 25  --  27  GLUCOSE 105*  --  110*  BUN 8  --  16  CREATININE 0.97 1.02 0.89  CALCIUM 9.2  --  8.6*   GFR Estimated Creatinine Clearance: 87.5 mL/min (by C-G formula based on SCr of 0.89 mg/dL). Liver Function Tests: Recent Labs  Lab 04/17/22 1922  AST 30  ALT 29  ALKPHOS 113  BILITOT 0.6  PROT 6.9  ALBUMIN 3.9   No results for input(s): "LIPASE", "AMYLASE" in the last 168 hours. No results for input(s): "AMMONIA" in the last 168 hours. Coagulation profile No results for input(s): "INR", "PROTIME" in the last 168 hours.  CBC: Recent Labs  Lab 04/17/22 1922 04/18/22 0129  WBC 5.9 8.3  NEUTROABS 2.5  --   HGB 13.6 12.9*  HCT 42.7 40.0  MCV 87.5 87.1  PLT 324 302   Cardiac Enzymes: No results for input(s): "CKTOTAL", "CKMB", "CKMBINDEX", "TROPONINI" in the last 168 hours. BNP: Invalid input(s): "POCBNP" CBG: No results for input(s): "GLUCAP" in the last 168 hours. D-Dimer No results for input(s): "DDIMER" in the last 72 hours. Hgb A1c No results for input(s): "HGBA1C" in the last 72 hours. Lipid Profile No results for input(s): "CHOL", "HDL", "LDLCALC", "TRIG", "CHOLHDL", "LDLDIRECT" in the last 72 hours. Thyroid function studies No results for input(s): "TSH", "T4TOTAL", "T3FREE", "THYROIDAB" in the last 72 hours.  Invalid input(s): "FREET3" Anemia work up No results for input(s): "VITAMINB12", "FOLATE", "FERRITIN", "TIBC", "IRON", "RETICCTPCT" in the last 72 hours. Microbiology No results found for this or any previous visit (from the past 240 hour(s)).   Discharge Instructions:   Discharge Instructions     (HEART FAILURE PATIENTS) Call MD:  Anytime you have any of the following symptoms: 1) 3 pound weight gain in 24 hours or 5 pounds in 1 week 2) shortness of breath, with or without a dry hacking cough 3) swelling in the hands, feet or stomach 4) if you have to sleep on extra pillows at night in order to breathe.   Complete by: As  directed    Diet - low sodium heart healthy   Complete by: As directed    Heart Failure patients record your daily weight using the same scale at the same time of day   Complete by: As directed    Increase activity slowly   Complete by: As directed       Allergies as of 04/19/2022       Reactions   Penicillins Swelling   Did it involve swelling of the face/tongue/throat, SOB, or low BP? Yes Did it involve sudden or severe rash/hives, skin peeling, or any reaction on the inside of your mouth or nose? No Did you need to seek medical attention at a hospital or doctor's office? Yes When did it last happen?    young adult   If all above answers are "NO", may proceed with cephalosporin use.        Medication List     TAKE these medications  atorvastatin 40 MG tablet Commonly known as: LIPITOR Take 1 tablet (40 mg total) by mouth daily.   dapagliflozin propanediol 10 MG Tabs tablet Commonly known as: Farxiga Take 1 tablet (10 mg total) by mouth daily before breakfast.   gabapentin 300 MG capsule Commonly known as: NEURONTIN Take 1 capsule (300 mg total) by mouth 3 (three) times daily.   metoprolol succinate 25 MG 24 hr tablet Commonly known as: TOPROL-XL Take 0.5 tablets (12.5 mg total) by mouth daily.   spironolactone 25 MG tablet Commonly known as: ALDACTONE Take 1 tablet (25 mg total) by mouth daily.   tamsulosin 0.4 MG Caps capsule Commonly known as: FLOMAX Take 1 capsule (0.4 mg total) by mouth daily.   Torsemide 40 MG Tabs Take 40 mg by mouth 2 (two) times daily. What changed: medication strength          Time coordinating discharge: 45 min  Signed:  Joseph Art DO  [facility] 04/19/2022, 9:02 AM

## 2022-05-17 ENCOUNTER — Encounter (HOSPITAL_COMMUNITY): Payer: Self-pay | Admitting: Emergency Medicine

## 2022-05-17 ENCOUNTER — Emergency Department (HOSPITAL_COMMUNITY): Payer: Medicaid Other

## 2022-05-17 ENCOUNTER — Emergency Department (HOSPITAL_COMMUNITY)
Admission: EM | Admit: 2022-05-17 | Discharge: 2022-05-18 | Disposition: A | Discharge status: Home / Self Care | Payer: Medicaid Other | Attending: Emergency Medicine | Admitting: Emergency Medicine

## 2022-05-17 DIAGNOSIS — Z20822 Contact with and (suspected) exposure to covid-19: Secondary | ICD-10-CM | POA: Diagnosis not present

## 2022-05-17 DIAGNOSIS — R0602 Shortness of breath: Secondary | ICD-10-CM | POA: Diagnosis present

## 2022-05-17 DIAGNOSIS — I509 Heart failure, unspecified: Secondary | ICD-10-CM | POA: Diagnosis not present

## 2022-05-17 LAB — CBC WITH DIFFERENTIAL/PLATELET
Abs Immature Granulocytes: 0.02 10*3/uL (ref 0.00–0.07)
Basophils Absolute: 0.1 10*3/uL (ref 0.0–0.1)
Basophils Relative: 1 %
Eosinophils Absolute: 0.2 10*3/uL (ref 0.0–0.5)
Eosinophils Relative: 2 %
HCT: 40.4 % (ref 39.0–52.0)
Hemoglobin: 12.9 g/dL — ABNORMAL LOW (ref 13.0–17.0)
Immature Granulocytes: 0 %
Lymphocytes Relative: 35 %
Lymphs Abs: 2.5 10*3/uL (ref 0.7–4.0)
MCH: 28.7 pg (ref 26.0–34.0)
MCHC: 31.9 g/dL (ref 30.0–36.0)
MCV: 89.8 fL (ref 80.0–100.0)
Monocytes Absolute: 0.6 10*3/uL (ref 0.1–1.0)
Monocytes Relative: 8 %
Neutro Abs: 3.9 10*3/uL (ref 1.7–7.7)
Neutrophils Relative %: 54 %
Platelets: 359 10*3/uL (ref 150–400)
RBC: 4.5 MIL/uL (ref 4.22–5.81)
RDW: 14.7 % (ref 11.5–15.5)
WBC: 7.2 10*3/uL (ref 4.0–10.5)
nRBC: 0 % (ref 0.0–0.2)

## 2022-05-17 LAB — BASIC METABOLIC PANEL
Anion gap: 7 (ref 5–15)
BUN: 11 mg/dL (ref 6–20)
CO2: 27 mmol/L (ref 22–32)
Calcium: 9 mg/dL (ref 8.9–10.3)
Chloride: 108 mmol/L (ref 98–111)
Creatinine, Ser: 1.01 mg/dL (ref 0.61–1.24)
GFR, Estimated: >60 mL/min (ref >60)
Glucose, Bld: 95 mg/dL (ref 70–99)
Potassium: 3.6 mmol/L (ref 3.5–5.1)
Sodium: 142 mmol/L (ref 135–145)

## 2022-05-17 LAB — SARS CORONAVIRUS 2 BY RT PCR: SARS Coronavirus 2 by RT PCR: NEGATIVE

## 2022-05-17 LAB — MAGNESIUM: Magnesium: 1.8 mg/dL (ref 1.7–2.4)

## 2022-05-17 LAB — BRAIN NATRIURETIC PEPTIDE: B Natriuretic Peptide: 1083.5 pg/mL — ABNORMAL HIGH (ref 0.0–100.0)

## 2022-05-17 LAB — TROPONIN I (HIGH SENSITIVITY): Troponin I (High Sensitivity): 19 ng/L — ABNORMAL HIGH (ref <18)

## 2022-05-17 MED ORDER — POTASSIUM CHLORIDE CRYS ER 20 MEQ PO TBCR
40.0000 meq | EXTENDED_RELEASE_TABLET | Freq: Once | ORAL | Status: AC
  Administered 2022-05-17: 40 meq via ORAL
  Filled 2022-05-17: qty 2

## 2022-05-17 MED ORDER — SPIRONOLACTONE 25 MG PO TABS
25.0000 mg | ORAL_TABLET | Freq: Every day | ORAL | 0 refills | Status: DC
  Filled 2022-05-17: qty 90, 90d supply, fill #0

## 2022-05-17 MED ORDER — ATORVASTATIN CALCIUM 40 MG PO TABS
40.0000 mg | ORAL_TABLET | Freq: Every day | ORAL | 0 refills | Status: DC
  Filled 2022-05-17: qty 90, 90d supply, fill #0

## 2022-05-17 MED ORDER — TORSEMIDE 40 MG PO TABS
40.0000 mg | ORAL_TABLET | Freq: Two times a day (BID) | ORAL | 0 refills | Status: DC
  Filled 2022-05-17: qty 60, fill #0
  Filled 2022-05-19: qty 60, 30d supply, fill #0

## 2022-05-17 MED ORDER — DAPAGLIFLOZIN PROPANEDIOL 10 MG PO TABS
10.0000 mg | ORAL_TABLET | Freq: Every day | ORAL | 0 refills | Status: DC
  Filled 2022-05-17 – 2022-06-09 (×3): qty 90, 90d supply, fill #0

## 2022-05-17 MED ORDER — TORSEMIDE 20 MG PO TABS: 40.0000 mg | ORAL_TABLET | Freq: Every day | ORAL | Status: DC

## 2022-05-17 MED ORDER — FUROSEMIDE 40 MG PO TABS
40.0000 mg | ORAL_TABLET | Freq: Once | ORAL | Status: AC
  Administered 2022-05-17: 40 mg via ORAL

## 2022-05-17 MED ORDER — METOPROLOL SUCCINATE ER 25 MG PO TB24
12.5000 mg | ORAL_TABLET | Freq: Every day | ORAL | 0 refills | Status: DC
  Filled 2022-05-17: qty 45, 90d supply, fill #0

## 2022-05-17 MED ORDER — FUROSEMIDE 10 MG/ML IJ SOLN: 40.0000 mg | Freq: Once | INTRAMUSCULAR | Status: DC

## 2022-05-17 MED ORDER — FUROSEMIDE 10 MG/ML IJ SOLN
40.0000 mg | Freq: Once | INTRAMUSCULAR | Status: DC
  Filled 2022-05-17: qty 4

## 2022-05-17 MED ORDER — GABAPENTIN 300 MG PO CAPS
300.0000 mg | ORAL_CAPSULE | Freq: Three times a day (TID) | ORAL | 0 refills | Status: DC
  Filled 2022-05-17 – 2022-06-09 (×3): qty 90, 30d supply, fill #0
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
## 2022-05-17 NOTE — ED Provider Notes (Signed)
Care assumed from Dr. Melina Copa, patient with history of heart failure. He is getting a dose of torsemide and will need to be reassessed. Anticipate discharge.  Patient was resting quietly through the night and maintaining adequate oxygen saturation.  He is felt to be safe for discharge, is referred back to his cardiologist for follow-up.  Return precautions discussed.   Daryl Fuel, MD 54/56/25 [phone]
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
## 2022-05-17 NOTE — Discharge Instructions (Addendum)
You were seen in the emergency department for evaluation of cough and shortness of breath.  Your lab work showed your heart failure number to be elevated.  Your chest x-ray did not show any pneumonia and your COVID test was negative.  Will be important for you to take your cardiac medications regularly and avoid tobacco.  Please follow-up with heart failure clinic.  Return to the emergency department if any worsening or concerning symptoms

## 2022-05-18 LAB — RAPID URINE DRUG SCREEN, HOSP PERFORMED
Amphetamines: NOT DETECTED
Barbiturates: NOT DETECTED
Benzodiazepines: NOT DETECTED
Cocaine: NOT DETECTED
Opiates: NOT DETECTED
Tetrahydrocannabinol: NOT DETECTED

## 2022-05-19 ENCOUNTER — Telehealth (HOSPITAL_COMMUNITY): Payer: Self-pay | Admitting: Emergency Medicine

## 2022-05-19 ENCOUNTER — Other Ambulatory Visit (HOSPITAL_COMMUNITY): Payer: Self-pay

## 2022-05-19 ENCOUNTER — Other Ambulatory Visit: Payer: Self-pay

## 2022-05-19 MED ORDER — TORSEMIDE 20 MG PO TABS
40.0000 mg | ORAL_TABLET | Freq: Every day | ORAL | 0 refills | Status: DC
  Filled 2022-05-19 – 2022-06-09 (×2): qty 180, 90d supply, fill #0

## 2022-05-19 NOTE — Telephone Encounter (Signed)
Pharmacy asked if I could change torsemide to 20mg  tabs instead of 40mg .

## 2022-05-21 ENCOUNTER — Other Ambulatory Visit: Payer: Self-pay

## 2022-05-26 ENCOUNTER — Other Ambulatory Visit: Payer: Self-pay

## 2022-06-09 ENCOUNTER — Other Ambulatory Visit: Payer: Self-pay

## 2022-06-12 ENCOUNTER — Telehealth (HOSPITAL_COMMUNITY): Payer: Self-pay | Admitting: Surgery

## 2022-06-12 NOTE — Telephone Encounter (Signed)
I attempted to reach patient regarding scheduling an appt after referral placed to the [facility].  No ability to leave a message.

## 2022-07-07 ENCOUNTER — Encounter (HOSPITAL_COMMUNITY): Payer: Self-pay

## 2022-07-07 ENCOUNTER — Other Ambulatory Visit: Payer: Self-pay

## 2022-07-07 ENCOUNTER — Emergency Department (HOSPITAL_COMMUNITY)
Admission: EM | Admit: 2022-07-07 | Discharge: 2022-07-08 | Discharge status: Against Medical Advice | Payer: Medicaid Other | Attending: Emergency Medicine | Admitting: Emergency Medicine

## 2022-07-07 DIAGNOSIS — R55 Syncope and collapse: Secondary | ICD-10-CM | POA: Insufficient documentation

## 2022-07-07 DIAGNOSIS — Z5321 Procedure and treatment not carried out due to patient leaving prior to being seen by health care provider: Secondary | ICD-10-CM | POA: Insufficient documentation

## 2022-07-07 DIAGNOSIS — M25522 Pain in left elbow: Secondary | ICD-10-CM | POA: Insufficient documentation
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
## 2022-07-16 ENCOUNTER — Emergency Department (HOSPITAL_COMMUNITY)
Admission: EM | Admit: 2022-07-16 | Discharge: 2022-07-16 | Disposition: A | Discharge status: Home / Self Care | Payer: Medicaid Other | Attending: Emergency Medicine | Admitting: Emergency Medicine

## 2022-07-16 ENCOUNTER — Encounter (HOSPITAL_COMMUNITY): Payer: Self-pay

## 2022-07-16 ENCOUNTER — Other Ambulatory Visit: Payer: Self-pay

## 2022-07-16 DIAGNOSIS — M79671 Pain in right foot: Secondary | ICD-10-CM | POA: Diagnosis present

## 2022-07-16 DIAGNOSIS — Z79899 Other long term (current) drug therapy: Secondary | ICD-10-CM | POA: Insufficient documentation

## 2022-07-16 DIAGNOSIS — M79672 Pain in left foot: Secondary | ICD-10-CM | POA: Diagnosis not present

## 2022-07-16 DIAGNOSIS — I10 Essential (primary) hypertension: Secondary | ICD-10-CM | POA: Diagnosis not present

## 2022-07-16 MED ORDER — ACETAMINOPHEN 325 MG PO TABS
650.0000 mg | ORAL_TABLET | Freq: Once | ORAL | Status: DC
  Filled 2022-07-16: qty 2

## 2022-07-16 NOTE — ED Triage Notes (Signed)
Pt ambulatory to triage.  Pt c/o pain in both feet. Pt states he has blisters on both feet that are very painful. Pt states he walks all day and is unable to afford new shoes.  Pt also c/o aches all over and nasal congestion.

## 2022-07-16 NOTE — ED Notes (Signed)
Pt refused repeat vital signs.

## 2022-07-16 NOTE — ED Notes (Signed)
Meal provided to patient

## 2022-07-16 NOTE — ED Provider Notes (Signed)
[facility] EMERGENCY DEPARTMENT Provider Note   CSN: [id number] Arrival date & time: 07/16/22  0225     History  Chief Complaint  Patient presents with   Foot Pain    Daryl [name] is a 61 y.o. male.  61 y.o male with a PMH of HTN, NSVT, Tobacco abuse, alcohol abuse presents to the ED with a chief complaint of bilateral foot pain. Patient states the symptoms have been ongoing, he reports pain is due to the calluses on the bottom of his feet. He reports he has not showered or brushed his teeth.  Patient ports he supposed to be living in [place], however he wants only where he actually lives.  We are unsure whether he is homeless at this time.  He reports the pain in his feet have been ongoing for several months.  He also states that he needs to really brush his teeth.  No medical complaints at this time.  The history is provided by the patient and medical records.  Foot Pain       Home Medications Prior to Admission medications   Medication Sig Start Date End Date Taking? Authorizing Provider  atorvastatin (LIPITOR) 40 MG tablet Take 1 tablet (40 mg total) by mouth daily. 05/17/22 08/15/22  Terrilee Files, MD  dapagliflozin propanediol (FARXIGA) 10 MG TABS tablet Take 1 tablet (10 mg total) by mouth daily before breakfast. 05/17/22   Terrilee Files, MD  gabapentin (NEURONTIN) 300 MG capsule Take 1 capsule (300 mg total) by mouth 3 (three) times daily. 05/17/22   Terrilee Files, MD  metoprolol succinate (TOPROL-XL) 25 MG 24 hr tablet Take 1/2 tablet (12.5 mg total) by mouth daily. 05/17/22   Terrilee Files, MD  spironolactone (ALDACTONE) 25 MG tablet Take 1 tablet (25 mg total) by mouth daily. 05/17/22   Terrilee Files, MD  tamsulosin (FLOMAX) 0.4 MG CAPS capsule Take 1 capsule (0.4 mg total) by mouth daily. 04/19/22   Joseph Art, DO  torsemide (DEMADEX) 20 MG tablet Take 2 tablets (40 mg total) by mouth daily. 05/19/22   Terrilee Files, MD       Allergies    Penicillins    Review of Systems   Review of Systems  Physical Exam Updated Vital Signs BP (!) 112/94 (BP Location: Right Arm)   Pulse 96   Temp 97.9 F (36.6 C)   Resp 18   Ht 5\' 9"  (1.753 m)   Wt 72.6 kg   SpO2 97%   BMI 23.63 kg/m  Physical Exam  ED Results / Procedures / Treatments   Labs (all labs ordered are listed, but only abnormal results are displayed) Labs Reviewed - No data to display  EKG None  Radiology No results found.  Procedures Procedures    Medications Ordered in ED Medications  acetaminophen (TYLENOL) tablet 650 mg (650 mg Oral Patient Refused/Not Given 07/16/22 1046)    ED Course/ Medical Decision Making/ A&P                           Medical Decision Making Risk OTC drugs.   Patient here with chronic bilateral feet pain due to ongoing calluses.  He is also requesting a shower along with brushing his teeth, unknown where patient is currently residing but he reports he was supposed to be living out in [place].  Patient is asking for a razor in order to cut his calluses.  He is not complaining of any chest pain, no shortness of breath, no weakness to bilateral lower extremities.  Vitals are stable, his exam is benign.  He was offered a shower along with brushing his teeth however we reported to be unable to provide him with a razor at this time.  Patient used Tylenol for bilateral feet pain along with refused shower.  Patient is without any emergent medical need at this time. Stable for discharge.   Portions of this note were generated with Scientist, clinical (histocompatibility and immunogenetics). Dictation errors may occur despite best attempts at proofreading.   Final Clinical Impression(s) / ED Diagnoses Final diagnoses:  Pain in both feet    Rx / DC Orders ED Discharge Orders     None         Claude Manges, PA-C 07/16/22 1100    Jacalyn Lefevre, MD 07/16/22 1156

## 2022-07-16 NOTE — ED Notes (Signed)
Called 3x for triage with no answer.

## 2022-07-17 ENCOUNTER — Encounter (HOSPITAL_COMMUNITY): Payer: Self-pay

## 2022-07-17 ENCOUNTER — Emergency Department (HOSPITAL_COMMUNITY)
Admission: EM | Admit: 2022-07-17 | Discharge: 2022-07-17 | Disposition: A | Discharge status: Home / Self Care | Payer: Medicaid Other | Attending: Emergency Medicine | Admitting: Emergency Medicine

## 2022-07-17 ENCOUNTER — Other Ambulatory Visit: Payer: Self-pay

## 2022-07-17 DIAGNOSIS — M25579 Pain in unspecified ankle and joints of unspecified foot: Secondary | ICD-10-CM | POA: Insufficient documentation

## 2022-07-17 DIAGNOSIS — M79671 Pain in right foot: Secondary | ICD-10-CM

## 2022-07-17 NOTE — ED Notes (Signed)
Patient has been given discharge paperwork and is still sitting in the bed. This paramedic offered  a wheelchair and patient declined wheelchair.

## 2022-07-17 NOTE — ED Notes (Signed)
Patient still refusing to get out of bed. Patient was offered a wheelchair. Patient declined any and all attempts for me to help him. Security called at this time.

## 2022-07-17 NOTE — ED Triage Notes (Signed)
Pt ambulatory to triage, has to sit down on way to triage. Pt states, "I'm tired of walking." When asked why he is here, pt states, "to take a bath so I can eat Thanksgiving dinner."  Pt c/o pain in feet.

## 2022-07-17 NOTE — ED Notes (Signed)
Patient refusing to leave. Patient was discharged and given paperwork. This paramedic gave patient some soap, and washcloths to take home. Patient was given toothbrush and toothpaste as well. Patient asking "what did you do with my money." This paramedic did not receive any money from this patient or from anyone for this patient. Patient stating he was supposed to get a room to shower. Patient advised the emergency room is not the place to check in for a shower. Patient advised the emergency room is for patients with emergent medical needs. Patient still asking this paramedic about money. Patient was told once again that this paramedic received no money from or for this encounter. Patient still sitting in bed after being asked to leave. Patient was offered to use the shower and he still sat in the bed.

## 2022-07-17 NOTE — ED Provider Notes (Signed)
[facility] EMERGENCY DEPARTMENT Provider Note   CSN: [id number] Arrival date & time: 07/17/22  6712     History  Chief Complaint  Patient presents with   needs bath    Daryl [name] is a 61 y.o. male.  Patient states he is here because his feet hurt from some corns he has on there. Also states he wants a shower. When instructed that coming to the ED for a shower is not appropriate as it is not an emergency he states it is an emergency and since he is paying for the visit he should get a shower so he can go to thanksgiving dinner. No other complaints. Wants a foot doctor referral if I'm not going to cut off his corns or give him a razor blade to do it himself.         Home Medications Prior to Admission medications   Medication Sig Start Date End Date Taking? Authorizing Provider  atorvastatin (LIPITOR) 40 MG tablet Take 1 tablet (40 mg total) by mouth daily. 05/17/22 08/15/22  Terrilee Files, MD  dapagliflozin propanediol (FARXIGA) 10 MG TABS tablet Take 1 tablet (10 mg total) by mouth daily before breakfast. 05/17/22   Terrilee Files, MD  gabapentin (NEURONTIN) 300 MG capsule Take 1 capsule (300 mg total) by mouth 3 (three) times daily. 05/17/22   Terrilee Files, MD  metoprolol succinate (TOPROL-XL) 25 MG 24 hr tablet Take 1/2 tablet (12.5 mg total) by mouth daily. 05/17/22   Terrilee Files, MD  spironolactone (ALDACTONE) 25 MG tablet Take 1 tablet (25 mg total) by mouth daily. 05/17/22   Terrilee Files, MD  tamsulosin (FLOMAX) 0.4 MG CAPS capsule Take 1 capsule (0.4 mg total) by mouth daily. 04/19/22   Joseph Art, DO  torsemide (DEMADEX) 20 MG tablet Take 2 tablets (40 mg total) by mouth daily. 05/19/22   Terrilee Files, MD      Allergies    Penicillins    Review of Systems   Review of Systems  Physical Exam Updated Vital Signs BP (!) 143/113 (BP Location: Left Arm)   Pulse 91   Resp 16   Ht 5\' 9"  (1.753 m)   Wt 72.6 kg   SpO2 99%    BMI 23.63 kg/m  Physical Exam Vitals and nursing note reviewed.  Constitutional:      Appearance: He is well-developed.  HENT:     Head: Normocephalic and atraumatic.  Eyes:     Pupils: Pupils are equal, round, and reactive to light.  Cardiovascular:     Rate and Rhythm: Normal rate.  Pulmonary:     Effort: Pulmonary effort is normal. No respiratory distress.  Abdominal:     General: There is no distension.  Musculoskeletal:        General: Normal range of motion.     Cervical back: Normal range of motion.  Skin:    General: Skin is warm and dry.  Neurological:     General: No focal deficit present.     Mental Status: He is alert.     ED Results / Procedures / Treatments   Labs (all labs ordered are listed, but only abnormal results are displayed) Labs Reviewed - No data to display  EKG None  Radiology No results found.  Procedures Procedures    Medications Ordered in ED Medications - No data to display  ED Course/ Medical Decision Making/ A&P  Medical Decision Making  MSE complete. Stable for discharge.    Final Clinical Impression(s) / ED Diagnoses Final diagnoses:  None    Rx / DC Orders ED Discharge Orders     None         Laneta Guerin, Barbara Cower, MD 07/17/22 [phone]

## 2022-07-17 NOTE — ED Notes (Signed)
Security escorting patient out to lobby at this time.

## 2022-08-09 ENCOUNTER — Emergency Department (HOSPITAL_COMMUNITY): Payer: Medicaid Other

## 2022-08-09 ENCOUNTER — Other Ambulatory Visit: Payer: Self-pay

## 2022-08-09 ENCOUNTER — Encounter (HOSPITAL_COMMUNITY): Payer: Self-pay

## 2022-08-09 ENCOUNTER — Emergency Department (HOSPITAL_COMMUNITY)
Admission: EM | Admit: 2022-08-09 | Discharge: 2022-08-10 | Disposition: A | Discharge status: Home / Self Care | Payer: Medicaid Other | Source: Home / Self Care | Attending: Emergency Medicine | Admitting: Emergency Medicine

## 2022-08-09 DIAGNOSIS — I5042 Chronic combined systolic (congestive) and diastolic (congestive) heart failure: Secondary | ICD-10-CM | POA: Insufficient documentation

## 2022-08-09 DIAGNOSIS — I130 Hypertensive heart and chronic kidney disease with heart failure and stage 1 through stage 4 chronic kidney disease, or unspecified chronic kidney disease: Secondary | ICD-10-CM | POA: Insufficient documentation

## 2022-08-09 DIAGNOSIS — J189 Pneumonia, unspecified organism: Secondary | ICD-10-CM | POA: Insufficient documentation

## 2022-08-09 DIAGNOSIS — I509 Heart failure, unspecified: Secondary | ICD-10-CM

## 2022-08-09 DIAGNOSIS — Z79899 Other long term (current) drug therapy: Secondary | ICD-10-CM | POA: Insufficient documentation

## 2022-08-09 DIAGNOSIS — N1831 Chronic kidney disease, stage 3a: Secondary | ICD-10-CM | POA: Insufficient documentation

## 2022-08-09 DIAGNOSIS — J45909 Unspecified asthma, uncomplicated: Secondary | ICD-10-CM | POA: Insufficient documentation

## 2022-08-09 DIAGNOSIS — F17200 Nicotine dependence, unspecified, uncomplicated: Secondary | ICD-10-CM | POA: Insufficient documentation

## 2022-08-09 NOTE — ED Provider Triage Note (Signed)
Emergency Medicine Provider Triage Evaluation Note  Daryl [name] , a 61 y.o. male  was evaluated in triage.  Pt complains of SOB for several days. Mild cough, nasal congestion. Feels like he has "fluid on the right side". Hx of similar, with hx of pneumonia and CHF.   Review of Systems  Positive: SOB, cough, congestion, low back pain Negative: CP  Physical Exam  BP (!) 150/103 (BP Location: Left Arm)   Pulse (!) 111   Temp 98 F (36.7 C) (Oral)   Resp (!) 22   Ht 5\' 9"  (1.753 m)   Wt 72 kg   SpO2 95%   BMI 23.44 kg/m  Gen:   Awake, no distress   Resp:  Normal effort  MSK:   Moves extremities without difficulty  Other:    Medical Decision Making  Medically screening exam initiated at 11:11 PM.  Appropriate orders placed.  was informed that the remainder of the evaluation will be completed by another provider, this initial triage assessment does not replace that evaluation, and the importance of remaining in the ED until their evaluation is complete.  Workup initiated   [place] Jenifer T, PA-C 08/09/22 2313

## 2022-08-09 NOTE — ED Triage Notes (Signed)
States that he has become more SOB of the past couple of days, with cough and congestion, he states that is feels like it did when he has Pneumonia.

## 2022-08-10 ENCOUNTER — Other Ambulatory Visit: Payer: Self-pay

## 2022-08-10 ENCOUNTER — Encounter (HOSPITAL_COMMUNITY): Payer: Self-pay | Admitting: Emergency Medicine

## 2022-08-10 ENCOUNTER — Emergency Department (HOSPITAL_COMMUNITY): Payer: Medicaid Other

## 2022-08-10 ENCOUNTER — Inpatient Hospital Stay (HOSPITAL_COMMUNITY)
Admission: EM | Admit: 2022-08-10 | Discharge: 2022-08-12 | DRG: 291 | Disposition: A | Discharge status: Home / Self Care | Payer: Medicaid Other | Attending: Family Medicine | Admitting: Family Medicine

## 2022-08-10 DIAGNOSIS — Z880 Allergy status to penicillin: Secondary | ICD-10-CM

## 2022-08-10 DIAGNOSIS — F1011 Alcohol abuse, in remission: Secondary | ICD-10-CM | POA: Diagnosis present

## 2022-08-10 DIAGNOSIS — I2510 Atherosclerotic heart disease of native coronary artery without angina pectoris: Secondary | ICD-10-CM | POA: Diagnosis present

## 2022-08-10 DIAGNOSIS — I428 Other cardiomyopathies: Secondary | ICD-10-CM | POA: Diagnosis present

## 2022-08-10 DIAGNOSIS — E785 Hyperlipidemia, unspecified: Secondary | ICD-10-CM | POA: Diagnosis present

## 2022-08-10 DIAGNOSIS — Z5900 Homelessness unspecified: Secondary | ICD-10-CM

## 2022-08-10 DIAGNOSIS — Z1152 Encounter for screening for COVID-19: Secondary | ICD-10-CM | POA: Diagnosis not present

## 2022-08-10 DIAGNOSIS — J189 Pneumonia, unspecified organism: Secondary | ICD-10-CM | POA: Diagnosis not present

## 2022-08-10 DIAGNOSIS — I5043 Acute on chronic combined systolic (congestive) and diastolic (congestive) heart failure: Secondary | ICD-10-CM | POA: Diagnosis present

## 2022-08-10 DIAGNOSIS — I5033 Acute on chronic diastolic (congestive) heart failure: Secondary | ICD-10-CM | POA: Diagnosis present

## 2022-08-10 DIAGNOSIS — Z7984 Long term (current) use of oral hypoglycemic drugs: Secondary | ICD-10-CM

## 2022-08-10 DIAGNOSIS — I10 Essential (primary) hypertension: Secondary | ICD-10-CM | POA: Diagnosis not present

## 2022-08-10 DIAGNOSIS — N1831 Chronic kidney disease, stage 3a: Secondary | ICD-10-CM | POA: Diagnosis present

## 2022-08-10 DIAGNOSIS — I130 Hypertensive heart and chronic kidney disease with heart failure and stage 1 through stage 4 chronic kidney disease, or unspecified chronic kidney disease: Principal | ICD-10-CM | POA: Diagnosis present

## 2022-08-10 DIAGNOSIS — Z5987 Material hardship due to limited financial resources, not elsewhere classified: Secondary | ICD-10-CM | POA: Diagnosis not present

## 2022-08-10 DIAGNOSIS — I509 Heart failure, unspecified: Secondary | ICD-10-CM

## 2022-08-10 DIAGNOSIS — F17210 Nicotine dependence, cigarettes, uncomplicated: Secondary | ICD-10-CM | POA: Diagnosis present

## 2022-08-10 DIAGNOSIS — R252 Cramp and spasm: Secondary | ICD-10-CM | POA: Diagnosis not present

## 2022-08-10 DIAGNOSIS — Z79899 Other long term (current) drug therapy: Secondary | ICD-10-CM

## 2022-08-10 DIAGNOSIS — R0602 Shortness of breath: Secondary | ICD-10-CM | POA: Diagnosis not present

## 2022-08-10 DIAGNOSIS — F1411 Cocaine abuse, in remission: Secondary | ICD-10-CM | POA: Diagnosis present

## 2022-08-10 LAB — COMPREHENSIVE METABOLIC PANEL
ALT: 40 U/L (ref 0–44)
ALT: 52 U/L — ABNORMAL HIGH (ref 0–44)
AST: 38 U/L (ref 15–41)
AST: 48 U/L — ABNORMAL HIGH (ref 15–41)
Albumin: 3.2 g/dL — ABNORMAL LOW (ref 3.5–5.0)
Albumin: 3.9 g/dL (ref 3.5–5.0)
Alkaline Phosphatase: 116 U/L (ref 38–126)
Alkaline Phosphatase: 138 U/L — ABNORMAL HIGH (ref 38–126)
Anion gap: 11 (ref 5–15)
Anion gap: 7 (ref 5–15)
BUN: 12 mg/dL (ref 6–20)
BUN: 14 mg/dL (ref 6–20)
CO2: 23 mmol/L (ref 22–32)
CO2: 24 mmol/L (ref 22–32)
Calcium: 8.7 mg/dL — ABNORMAL LOW (ref 8.9–10.3)
Calcium: 8.7 mg/dL — ABNORMAL LOW (ref 8.9–10.3)
Chloride: 108 mmol/L (ref 98–111)
Chloride: 112 mmol/L — ABNORMAL HIGH (ref 98–111)
Creatinine, Ser: 0.93 mg/dL (ref 0.61–1.24)
Creatinine, Ser: 1.06 mg/dL (ref 0.61–1.24)
GFR, Estimated: >60 mL/min (ref >60)
GFR, Estimated: >60 mL/min (ref >60)
Glucose, Bld: 107 mg/dL — ABNORMAL HIGH (ref 70–99)
Glucose, Bld: 108 mg/dL — ABNORMAL HIGH (ref 70–99)
Potassium: 4 mmol/L (ref 3.5–5.1)
Potassium: 4.2 mmol/L (ref 3.5–5.1)
Sodium: 142 mmol/L (ref 135–145)
Sodium: 143 mmol/L (ref 135–145)
Total Bilirubin: 0.8 mg/dL (ref 0.3–1.2)
Total Bilirubin: 0.9 mg/dL (ref 0.3–1.2)
Total Protein: 5.4 g/dL — ABNORMAL LOW (ref 6.5–8.1)
Total Protein: 7.2 g/dL (ref 6.5–8.1)

## 2022-08-10 LAB — CBC WITH DIFFERENTIAL/PLATELET
Abs Immature Granulocytes: 0.02 10*3/uL (ref 0.00–0.07)
Abs Immature Granulocytes: 0.02 10*3/uL (ref 0.00–0.07)
Basophils Absolute: 0 10*3/uL (ref 0.0–0.1)
Basophils Absolute: 0 10*3/uL (ref 0.0–0.1)
Basophils Relative: 1 %
Basophils Relative: 1 %
Eosinophils Absolute: 0.1 10*3/uL (ref 0.0–0.5)
Eosinophils Absolute: 0.1 10*3/uL (ref 0.0–0.5)
Eosinophils Relative: 1 %
Eosinophils Relative: 1 %
HCT: 41.7 % (ref 39.0–52.0)
HCT: 42 % (ref 39.0–52.0)
Hemoglobin: 13 g/dL (ref 13.0–17.0)
Hemoglobin: 13.1 g/dL (ref 13.0–17.0)
Immature Granulocytes: 0 %
Immature Granulocytes: 0 %
Lymphocytes Relative: 28 %
Lymphocytes Relative: 32 %
Lymphs Abs: 2.3 10*3/uL (ref 0.7–4.0)
Lymphs Abs: 2.9 10*3/uL (ref 0.7–4.0)
MCH: 28.1 pg (ref 26.0–34.0)
MCH: 28.2 pg (ref 26.0–34.0)
MCHC: 31.2 g/dL (ref 30.0–36.0)
MCHC: 31.2 g/dL (ref 30.0–36.0)
MCV: 90.1 fL (ref 80.0–100.0)
MCV: 90.5 fL (ref 80.0–100.0)
Monocytes Absolute: 0.8 10*3/uL (ref 0.1–1.0)
Monocytes Absolute: 0.8 10*3/uL (ref 0.1–1.0)
Monocytes Relative: 10 %
Monocytes Relative: 9 %
Neutro Abs: 5.1 10*3/uL (ref 1.7–7.7)
Neutro Abs: 5.1 10*3/uL (ref 1.7–7.7)
Neutrophils Relative %: 57 %
Neutrophils Relative %: 60 %
Platelets: 417 10*3/uL — ABNORMAL HIGH (ref 150–400)
Platelets: 422 10*3/uL — ABNORMAL HIGH (ref 150–400)
RBC: 4.63 MIL/uL (ref 4.22–5.81)
RBC: 4.64 MIL/uL (ref 4.22–5.81)
RDW: 15 % (ref 11.5–15.5)
RDW: 15 % (ref 11.5–15.5)
WBC: 8.4 10*3/uL (ref 4.0–10.5)
WBC: 8.9 10*3/uL (ref 4.0–10.5)
nRBC: 0 % (ref 0.0–0.2)
nRBC: 0 % (ref 0.0–0.2)

## 2022-08-10 LAB — RESP PANEL BY RT-PCR (RSV, FLU A&B, COVID)  RVPGX2
Influenza A by PCR: NEGATIVE
Influenza B by PCR: NEGATIVE
Resp Syncytial Virus by PCR: NEGATIVE
SARS Coronavirus 2 by RT PCR: NEGATIVE

## 2022-08-10 LAB — BRAIN NATRIURETIC PEPTIDE
B Natriuretic Peptide: 1156.1 pg/mL — ABNORMAL HIGH (ref 0.0–100.0)
B Natriuretic Peptide: 1473.7 pg/mL — ABNORMAL HIGH (ref 0.0–100.0)

## 2022-08-10 LAB — TROPONIN I (HIGH SENSITIVITY)
Troponin I (High Sensitivity): 19 ng/L — ABNORMAL HIGH (ref <18)
Troponin I (High Sensitivity): 23 ng/L — ABNORMAL HIGH (ref <18)

## 2022-08-10 MED ORDER — SODIUM CHLORIDE 0.9 % IV SOLN
2.0000 g | Freq: Every day | INTRAVENOUS | Status: DC
  Administered 2022-08-11 – 2022-08-12 (×2): 2 g via INTRAVENOUS
  Filled 2022-08-10 (×3): qty 20

## 2022-08-10 MED ORDER — FUROSEMIDE 10 MG/ML IJ SOLN
40.0000 mg | Freq: Two times a day (BID) | INTRAMUSCULAR | Status: DC
  Administered 2022-08-11 – 2022-08-12 (×3): 40 mg via INTRAVENOUS
  Filled 2022-08-10 (×3): qty 4

## 2022-08-10 MED ORDER — GUAIFENESIN 100 MG/5ML PO LIQD: 5.0000 mL | ORAL | Status: DC | PRN

## 2022-08-10 MED ORDER — ACETAMINOPHEN 325 MG PO TABS: 650.0000 mg | ORAL_TABLET | Freq: Four times a day (QID) | ORAL | Status: DC | PRN

## 2022-08-10 MED ORDER — DOXYCYCLINE HYCLATE 100 MG PO CAPS: 100.0000 mg | ORAL_CAPSULE | Freq: Two times a day (BID) | ORAL | 0 refills | Status: DC

## 2022-08-10 MED ORDER — TAMSULOSIN HCL 0.4 MG PO CAPS
0.4000 mg | ORAL_CAPSULE | Freq: Every day | ORAL | Status: DC
  Administered 2022-08-11 – 2022-08-12 (×2): 0.4 mg via ORAL
  Filled 2022-08-10 (×2): qty 1

## 2022-08-10 MED ORDER — SODIUM CHLORIDE 0.9 % IV SOLN
500.0000 mg | Freq: Once | INTRAVENOUS | Status: AC
  Administered 2022-08-10: 500 mg via INTRAVENOUS
  Filled 2022-08-10: qty 5

## 2022-08-10 MED ORDER — DOXYCYCLINE HYCLATE 100 MG PO TABS
100.0000 mg | ORAL_TABLET | Freq: Two times a day (BID) | ORAL | Status: DC
  Administered 2022-08-11 – 2022-08-12 (×4): 100 mg via ORAL
  Filled 2022-08-10 (×4): qty 1

## 2022-08-10 MED ORDER — SENNOSIDES-DOCUSATE SODIUM 8.6-50 MG PO TABS: 1.0000 | ORAL_TABLET | Freq: Every evening | ORAL | Status: DC | PRN

## 2022-08-10 MED ORDER — FUROSEMIDE 10 MG/ML IJ SOLN
40.0000 mg | Freq: Once | INTRAMUSCULAR | Status: AC
  Administered 2022-08-10: 40 mg via INTRAVENOUS
  Filled 2022-08-10: qty 4

## 2022-08-10 MED ORDER — FUROSEMIDE 40 MG PO TABS: 40.0000 mg | ORAL_TABLET | Freq: Every day | ORAL | 0 refills | Status: DC

## 2022-08-10 MED ORDER — ATORVASTATIN CALCIUM 40 MG PO TABS
40.0000 mg | ORAL_TABLET | Freq: Every day | ORAL | Status: DC
  Administered 2022-08-11 – 2022-08-12 (×2): 40 mg via ORAL
  Filled 2022-08-10 (×2): qty 1

## 2022-08-10 MED ORDER — SODIUM CHLORIDE 0.9% FLUSH
3.0000 mL | Freq: Two times a day (BID) | INTRAVENOUS | Status: DC
  Administered 2022-08-11 (×3): 3 mL via INTRAVENOUS

## 2022-08-10 MED ORDER — ACETAMINOPHEN 650 MG RE SUPP: 650.0000 mg | Freq: Four times a day (QID) | RECTAL | Status: DC | PRN

## 2022-08-10 MED ORDER — DOXYCYCLINE HYCLATE 100 MG PO TABS
100.0000 mg | ORAL_TABLET | Freq: Once | ORAL | Status: AC
  Administered 2022-08-10: 100 mg via ORAL
  Filled 2022-08-10: qty 1

## 2022-08-10 MED ORDER — ENOXAPARIN SODIUM 40 MG/0.4ML IJ SOSY
40.0000 mg | PREFILLED_SYRINGE | Freq: Every day | INTRAMUSCULAR | Status: DC
  Administered 2022-08-11 – 2022-08-12 (×2): 40 mg via SUBCUTANEOUS
  Filled 2022-08-10 (×2): qty 0.4

## 2022-08-10 MED ORDER — SODIUM CHLORIDE 0.9 % IV SOLN
1.0000 g | Freq: Once | INTRAVENOUS | Status: DC
  Filled 2022-08-10: qty 10

## 2022-08-10 NOTE — ED Provider Notes (Signed)
[facility] EMERGENCY DEPARTMENT Provider Note   CSN: [id number] Arrival date & time: 08/10/22  1446     History  Chief Complaint  Patient presents with   Shortness of Breath    Daryl [name] is a 62 y.o. male.  Pt is a 61 yo male with a pmhx significant for CHF (EF 25-30% in July), HLD, and HTN.  Pt has not been taking any of his meds.  He went to [facility] last night and was diagnosed with a CHF exacerbation and CAP.  He did not get his meds filled.  He said he does not have any money.  He also said his sob is not good and his legs are swollen.       Home Medications Prior to Admission medications   Medication Sig Start Date End Date Taking? Authorizing Provider  atorvastatin (LIPITOR) 40 MG tablet Take 1 tablet (40 mg total) by mouth daily. 05/17/22 08/15/22  Terrilee Files, MD  dapagliflozin propanediol (FARXIGA) 10 MG TABS tablet Take 1 tablet (10 mg total) by mouth daily before breakfast. 05/17/22   Terrilee Files, MD  doxycycline (VIBRAMYCIN) 100 MG capsule Take 1 capsule (100 mg total) by mouth 2 (two) times daily. 08/10/22   Horton, Mayer Masker, MD  furosemide (LASIX) 40 MG tablet Take 1 tablet (40 mg total) by mouth daily. 08/10/22   Horton, Mayer Masker, MD  gabapentin (NEURONTIN) 300 MG capsule Take 1 capsule (300 mg total) by mouth 3 (three) times daily. 05/17/22   Terrilee Files, MD  metoprolol succinate (TOPROL-XL) 25 MG 24 hr tablet Take 1/2 tablet (12.5 mg total) by mouth daily. 05/17/22   Terrilee Files, MD  spironolactone (ALDACTONE) 25 MG tablet Take 1 tablet (25 mg total) by mouth daily. 05/17/22   Terrilee Files, MD  tamsulosin (FLOMAX) 0.4 MG CAPS capsule Take 1 capsule (0.4 mg total) by mouth daily. 04/19/22   Joseph Art, DO  torsemide (DEMADEX) 20 MG tablet Take 2 tablets (40 mg total) by mouth daily. 05/19/22   Terrilee Files, MD      Allergies    Penicillins    Review of Systems   Review of Systems  Respiratory:  Positive  for shortness of breath.   Cardiovascular:  Positive for leg swelling.  All other systems reviewed and are negative.   Physical Exam Updated Vital Signs BP 97/64   Pulse (!) 109   Temp 98 F (36.7 C)   Resp (!) 21   SpO2 90%  Physical Exam Vitals and nursing note reviewed.  Constitutional:      Appearance: He is well-developed.  HENT:     Head: Normocephalic and atraumatic.     Mouth/Throat:     Mouth: Mucous membranes are moist.     Pharynx: Oropharynx is clear.  Eyes:     Extraocular Movements: Extraocular movements intact.     Pupils: Pupils are equal, round, and reactive to light.  Cardiovascular:     Rate and Rhythm: Normal rate and regular rhythm.  Pulmonary:     Effort: Pulmonary effort is normal.     Breath sounds: Rhonchi present.  Abdominal:     General: Bowel sounds are normal.     Palpations: Abdomen is soft.  Musculoskeletal:        General: Normal range of motion.     Cervical back: Normal range of motion and neck supple.     Right lower leg: Edema present.  Left lower leg: Edema present.  Skin:    General: Skin is warm.     Capillary Refill: Capillary refill takes less than 2 seconds.  Neurological:     General: No focal deficit present.     Mental Status: He is alert and oriented to person, place, and time.  Psychiatric:        Mood and Affect: Mood normal.        Behavior: Behavior normal.     ED Results / Procedures / Treatments   Labs (all labs ordered are listed, but only abnormal results are displayed) Labs Reviewed  CBC WITH DIFFERENTIAL/PLATELET - Abnormal; Notable for the following components:      Result Value   Platelets 422 (*)    All other components within normal limits  COMPREHENSIVE METABOLIC PANEL - Abnormal; Notable for the following components:   Glucose, Bld 108 (*)    Calcium 8.7 (*)    Total Protein 5.4 (*)    Albumin 3.2 (*)    All other components within normal limits  BRAIN NATRIURETIC PEPTIDE - Abnormal;  Notable for the following components:   B Natriuretic Peptide 1,156.1 (*)    All other components within normal limits  TROPONIN I (HIGH SENSITIVITY) - Abnormal; Notable for the following components:   Troponin I (High Sensitivity) 19 (*)    All other components within normal limits  RESP PANEL BY RT-PCR (RSV, FLU A&B, COVID)  RVPGX2  COMPREHENSIVE METABOLIC PANEL  TROPONIN I (HIGH SENSITIVITY)    EKG EKG Interpretation  Date/Time:  Sunday August 10 2022 22:12:03 EST Ventricular Rate:  105 PR Interval:  150 QRS Duration: 103 QT Interval:  371 QTC Calculation: 491 R Axis:   77 Text Interpretation: Sinus tachycardia Left atrial enlargement LVH with secondary repolarization abnormality Borderline prolonged QT interval No significant change since last tracing Confirmed by Isla Pence [phone]) on 08/10/2022 11:07:13 PM  Radiology DG Chest Portable 1 View  Result Date: 08/10/2022 CLINICAL DATA:  Shortness of breath EXAM: PORTABLE CHEST 1 VIEW COMPARISON:  Chest x-ray 08/09/2022 FINDINGS: The heart is enlarged. There is a small right pleural effusion. There are patchy airspace opacities in the right lung base. There is no pneumothorax or acute fracture. IMPRESSION: 1. Patchy airspace opacities in the right lung base may reflect infection or aspiration. 2. Small right pleural effusion. 3. Cardiomegaly. Electronically Signed   By: Ronney Asters M.D.   On: 08/10/2022 21:48   DG Chest 2 View  Result Date: 08/09/2022 CLINICAL DATA:  Shortness of breath EXAM: CHEST - 2 VIEW COMPARISON:  05/17/2022 FINDINGS: Small right pleural effusion. Large area of right lower lobe consolidation. Left lung is clear. Normal cardiomediastinal contours. IMPRESSION: Probable right lower lobe pneumonia.  Small right pleural effusion. Electronically Signed   By: Ulyses Jarred M.D.   On: 08/09/2022 23:44    Procedures Procedures    Medications Ordered in ED Medications  cefTRIAXone (ROCEPHIN) 1 g in sodium  chloride 0.9 % 100 mL IVPB (has no administration in time range)  azithromycin (ZITHROMAX) 500 mg in sodium chloride 0.9 % 250 mL IVPB (has no administration in time range)  furosemide (LASIX) injection 40 mg (40 mg Intravenous Given 08/10/22 2216)    ED Course/ Medical Decision Making/ A&P                           Medical Decision Making Amount and/or Complexity of Data Reviewed Labs: ordered. Radiology: ordered.  Risk Prescription drug management. Decision regarding hospitalization.   This patient presents to the ED for concern of sob, this involves an extensive number of treatment options, and is a complaint that carries with it a high risk of complications and morbidity.  The differential diagnosis includes chf exacerbation, pna, covid/flu   Co morbidities that complicate the patient evaluation  CHF (EF 25-30% in July), HLD, and HTN   Additional history obtained:  Additional history obtained from epic chart review    Lab Tests:  I Ordered, and personally interpreted labs.  The pertinent results include:  cbc nl, cmp nl, trop 19 (chronic), covid/flu/rsv neg   Imaging Studies ordered:  I ordered imaging studies including cxr  I independently visualized and interpreted imaging which showed  1. Patchy airspace opacities in the right lung base may reflect  infection or aspiration.  2. Small right pleural effusion.  3. Cardiomegaly.   I agree with the radiologist interpretation   Cardiac Monitoring:  The patient was maintained on a cardiac monitor.  I personally viewed and interpreted the cardiac monitored which showed an underlying rhythm of: nsr   Medicines ordered and prescription drug management:  I ordered medication including lasix  for chf and rocephin/zithromax for pna  Reevaluation of the patient after these medicines showed that the patient improved I have reviewed the patients home medicines and have made adjustments as needed    Critical  Interventions:  Lasix/abx   Consultations Obtained:  I requested consultation with the hospitalist (Dr. Myna Hidalgo),  and discussed lab and imaging findings as well as pertinent plan - he will admit   Problem List / ED Course:  SOB:  likely a combination of CHF and PNA.  He does not have a pcp and does not have his meds.  He does not have the money to get them filled and is very sob with ambulation.  I think he needs to stay to diurese and to start tx for his pna.  Pt is given lasix and rocephin/zithromax.     Reevaluation:  After the interventions noted above, I reevaluated the patient and found that they have :improved   Social Determinants of Health:  Lives at home   Dispostion:  After consideration of the diagnostic results and the patients response to treatment, I feel that the patent would benefit from admission.          Final Clinical Impression(s) / ED Diagnoses Final diagnoses:  Community acquired pneumonia of right lower lobe of lung  Acute on chronic congestive heart failure, unspecified heart failure type [facility])    Rx / DC Orders ED Discharge Orders     None         Isla Pence, MD 08/10/22 2341
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
## 2022-08-10 NOTE — ED Triage Notes (Addendum)
Patient w/ [facility] was seen at [facility] for same last night. Patient states he is becoming exertionally short of breath and this has worsened over the last few days. Patient was subsequently discharged from [facility]

## 2022-08-10 NOTE — ED Provider Notes (Signed)
[facility]-EMERGENCY DEPT Provider Note   CSN: [id number] Arrival date & time: 08/09/22  2245     History  Chief Complaint  Patient presents with   Shortness of Breath    Daryl [name] is a 61 y.o. male.  HPI     This is a 61 year old male who presents with shortness of breath.  Reports 2 to 3-day history of worsening shortness of breath.  Has had some leg swelling.  Reports a dry cough.  No fevers.  He states that shortness of breath feels like when he had pneumonia previously.  He is unsure whether he is supposed to be on any medications and states he has not seen a doctor in some time.  Denies chest pain.  Home Medications Prior to Admission medications   Medication Sig Start Date End Date Taking? Authorizing Provider  doxycycline (VIBRAMYCIN) 100 MG capsule Take 1 capsule (100 mg total) by mouth 2 (two) times daily. 08/10/22  Yes Jazaria Jarecki, Mayer Masker, MD  furosemide (LASIX) 40 MG tablet Take 1 tablet (40 mg total) by mouth daily. 08/10/22  Yes Ralph Brouwer, Mayer Masker, MD  atorvastatin (LIPITOR) 40 MG tablet Take 1 tablet (40 mg total) by mouth daily. 05/17/22 08/15/22  Terrilee Files, MD  dapagliflozin propanediol (FARXIGA) 10 MG TABS tablet Take 1 tablet (10 mg total) by mouth daily before breakfast. 05/17/22   Terrilee Files, MD  gabapentin (NEURONTIN) 300 MG capsule Take 1 capsule (300 mg total) by mouth 3 (three) times daily. 05/17/22   Terrilee Files, MD  metoprolol succinate (TOPROL-XL) 25 MG 24 hr tablet Take 1/2 tablet (12.5 mg total) by mouth daily. 05/17/22   Terrilee Files, MD  spironolactone (ALDACTONE) 25 MG tablet Take 1 tablet (25 mg total) by mouth daily. 05/17/22   Terrilee Files, MD  tamsulosin (FLOMAX) 0.4 MG CAPS capsule Take 1 capsule (0.4 mg total) by mouth daily. 04/19/22   Joseph Art, DO  torsemide (DEMADEX) 20 MG tablet Take 2 tablets (40 mg total) by mouth daily. 05/19/22   Terrilee Files, MD      Allergies     Penicillins    Review of Systems   Review of Systems  Respiratory:  Positive for cough and shortness of breath.   Cardiovascular:  Negative for chest pain.  All other systems reviewed and are negative.   Physical Exam Updated Vital Signs BP 110/71   Pulse 95   Temp 98 F (36.7 C) (Oral)   Resp (!) 24   Ht 1.753 m (5\' 9" )   Wt 72 kg   SpO2 93%   BMI 23.44 kg/m  Physical Exam Vitals and nursing note reviewed.  Constitutional:      Appearance: He is well-developed. He is not ill-appearing.  HENT:     Head: Normocephalic and atraumatic.  Eyes:     Pupils: Pupils are equal, round, and reactive to light.  Cardiovascular:     Rate and Rhythm: Normal rate and regular rhythm.     Heart sounds: Normal heart sounds. No murmur heard. Pulmonary:     Effort: Pulmonary effort is normal. No respiratory distress.     Breath sounds: Normal breath sounds. No wheezing or rhonchi.  Abdominal:     General: Bowel sounds are normal.     Palpations: Abdomen is soft.     Tenderness: There is no abdominal tenderness. There is no rebound.  Musculoskeletal:     Cervical back: Neck supple.  Right lower leg: Edema present.     Left lower leg: Edema present.     Comments: 1-2+ bilateral lower extremity pitting edema to the ankle  Lymphadenopathy:     Cervical: No cervical adenopathy.  Skin:    General: Skin is warm and dry.  Neurological:     Mental Status: He is alert and oriented to person, place, and time.  Psychiatric:        Mood and Affect: Mood normal.     ED Results / Procedures / Treatments   Labs (all labs ordered are listed, but only abnormal results are displayed) Labs Reviewed  CBC WITH DIFFERENTIAL/PLATELET - Abnormal; Notable for the following components:      Result Value   Platelets 417 (*)    All other components within normal limits  COMPREHENSIVE METABOLIC PANEL - Abnormal; Notable for the following components:   Chloride 112 (*)    Glucose, Bld 107 (*)     Calcium 8.7 (*)    AST 48 (*)    ALT 52 (*)    Alkaline Phosphatase 138 (*)    All other components within normal limits  BRAIN NATRIURETIC PEPTIDE - Abnormal; Notable for the following components:   B Natriuretic Peptide 1,473.7 (*)    All other components within normal limits  TROPONIN I (HIGH SENSITIVITY) - Abnormal; Notable for the following components:   Troponin I (High Sensitivity) 23 (*)    All other components within normal limits  TROPONIN I (HIGH SENSITIVITY)    EKG EKG Interpretation  Date/Time:  Saturday August 09 2022 23:47:24 EST Ventricular Rate:  102 PR Interval:  156 QRS Duration: 76 QT Interval:  365 QTC Calculation: 476 R Axis:   50 Text Interpretation: Sinus tachycardia Left atrial enlargement Anterior infarct, old Confirmed by Thayer Jew [phone]) on 08/10/2022 1:08:08 AM  Radiology DG Chest 2 View  Result Date: 08/09/2022 CLINICAL DATA:  Shortness of breath EXAM: CHEST - 2 VIEW COMPARISON:  05/17/2022 FINDINGS: Small right pleural effusion. Large area of right lower lobe consolidation. Left lung is clear. Normal cardiomediastinal contours. IMPRESSION: Probable right lower lobe pneumonia.  Small right pleural effusion. Electronically Signed   By: Ulyses Jarred M.D.   On: 08/09/2022 23:44    Procedures .Critical Care  Performed by: Merryl Hacker, MD Authorized by: Merryl Hacker, MD   Critical care provider statement:    Critical care time (minutes):  35   Critical care was necessary to treat or prevent imminent or life-threatening deterioration of the following conditions:  Cardiac failure   Critical care was time spent personally by me on the following activities:  Development of treatment plan with patient or surrogate, discussions with consultants, evaluation of patient's response to treatment, examination of patient, ordering and review of laboratory studies, ordering and review of radiographic studies, ordering and performing  treatments and interventions, pulse oximetry, re-evaluation of patient's condition and review of old charts     Medications Ordered in ED Medications  furosemide (LASIX) injection 40 mg (40 mg Intravenous Given 08/10/22 0152)  doxycycline (VIBRA-TABS) tablet 100 mg (100 mg Oral Given 08/10/22 0149)    ED Course/ Medical Decision Making/ A&P Clinical Course as of 08/10/22 0452  Sun Aug 10, 2022  0438 Per nursing, patient refused to ambulate with pulse ox.  Additionally he refused further lab sticks.  He was seen ambulating without difficulty prior to evaluation.  His O2 sats have been 93 to 100% on room air.  Suspect multifactorial.  Will treat for pneumonia and provide with prescription for diuresis as well. [CH]  [phone] Our nurse was able to get Mr. Agner ambulated.  He continued to refuse further IV.  Ambulated with pulse ox and maintained O2 sats of 93%. [CH]    Clinical Course User Index [CH] Georg Ang, Barbette Hair, MD                           Medical Decision Making Risk Prescription drug management.   This patient presents to the ED for concern of shortness of breath, this involves an extensive number of treatment options, and is a complaint that carries with it a high risk of complications and morbidity.  I considered the following differential and admission for this acute, potentially life threatening condition.  The differential diagnosis includes pneumonia, pulmonary edema, heart failure, pneumothorax, PE, ACS  MDM:    This is a 61 year old male who presents with shortness of breath.  Reports several day history of the same.  Reports worsening orthopnea and cough.  No fevers.  He is nontoxic-appearing.  Vital signs are largely reassuring.  He is satting 93 to 100% on room air.  He is in no respiratory distress.  He does have evidence of lower extremity edema.  Last echo based on chart review was 25 to 30%.  He previously was on torsemide but apparently is not on any of his  medications at the moment.  Chest x-ray with probable right lower lobe pneumonia and a small effusion.  I highly suspect his shortness of breath may be multifactorial and related to pneumonia and volume overload.  He was given a dose of Lasix and doxycycline.  He refused further lab sticks.  Troponin noted to be stably elevated at 23.  Doubt primary ACS.  BNP was elevated as well again suggesting some component of pulmonary edema.  Ultimately he was able to ambulate and maintain his pulse ox.  Will reinitiate his Lasix for the next 10 days and he was given a prescription for doxycycline.  He was encouraged to stop smoking.  (Labs, imaging, consults)  Labs: I Ordered, and personally interpreted labs.  The pertinent results include: CBC, BMP, BNP, troponin  Imaging Studies ordered: I ordered imaging studies including chest x-ray I independently visualized and interpreted imaging. I agree with the radiologist interpretation  Additional history obtained from chart review.  External records from outside source obtained and reviewed including prior evaluations Cardiac Monitoring: The patient was maintained on a cardiac monitor.  I personally viewed and interpreted the cardiac monitored which showed an underlying rhythm of: Sinus rhythm  Reevaluation: After the interventions noted above, I reevaluated the patient and found that they have :improved  Social Determinants of Health:  significant alcohol and tobacco use history  Disposition: Discharge  Co morbidities that complicate the patient evaluation  Past Medical History:  Diagnosis Date   Asthma    Chronic combined systolic and diastolic CHF (congestive heart failure) ([place])    Dyslipidemia    Essential hypertension 11/02/2018   ETOH abuse 02/14/2020   Nonobstructive CAD    Non-obstructive disease on cardiac cath in 08/2018   NSVT (nonsustained ventricular tachycardia) (HCC)    PNA (pneumonia) 09/11/2018   Sepsis ([place]) 09/11/2018    Stage 3a chronic kidney disease ([place]) 02/19/2020   Tobacco abuse 09/11/2018     Medicines Meds ordered this encounter  Medications   furosemide (LASIX) injection 40 mg   doxycycline (VIBRA-TABS) tablet  100 mg   doxycycline (VIBRAMYCIN) 100 MG capsule    Sig: Take 1 capsule (100 mg total) by mouth 2 (two) times daily.    Dispense:  20 capsule    Refill:  0   furosemide (LASIX) 40 MG tablet    Sig: Take 1 tablet (40 mg total) by mouth daily.    Dispense:  10 tablet    Refill:  0    I have reviewed the patients home medicines and have made adjustments as needed  Problem List / ED Course: Problem List Items Addressed This Visit       Cardiovascular and Mediastinum   Acute CHF (congestive heart failure) (HCC)   Relevant Medications   furosemide (LASIX) 40 MG tablet   Other Visit Diagnoses     Community acquired pneumonia, unspecified laterality    -  Primary   Relevant Medications   doxycycline (VIBRA-TABS) tablet 100 mg (Completed)   doxycycline (VIBRAMYCIN) 100 MG capsule                   Final Clinical Impression(s) / ED Diagnoses Final diagnoses:  Community acquired pneumonia, unspecified laterality  Acute congestive heart failure, unspecified heart failure type ([place])    Rx / DC Orders ED Discharge Orders          Ordered    doxycycline (VIBRAMYCIN) 100 MG capsule  2 times daily        08/10/22 0452    furosemide (LASIX) 40 MG tablet  Daily        08/10/22 0452              Merryl Hacker, MD 08/10/22 [phone]

## 2022-08-10 NOTE — ED Notes (Signed)
Called pt for room x3, no response.

## 2022-08-10 NOTE — ED Provider Triage Note (Signed)
Emergency Medicine Provider Triage Evaluation Note  Daryl [name] , a 61 y.o. male  was evaluated in triage.  Pt complains of shortness of breath.  Patient notes shortness of breath for past several days.  Was seen yesterday and diagnosed with CHF exacerbation as well as left lower lobe pneumonia.  States he has been taking his medications as prescribed.  Reports persistence of shortness of breath with no change..  Review of Systems  Positive: See above Negative:   Physical Exam  BP (!) 142/79   Pulse 94   Temp 98 F (36.7 C)   Resp 19   SpO2 97%  Gen:   Awake, no distress   Resp:  Normal effort  MSK:   Moves extremities without difficulty  Other:    Medical Decision Making  Medically screening exam initiated at 3:24 PM.  Appropriate orders placed.  [name] was informed that the remainder of the evaluation will be completed by another provider, this initial triage assessment does not replace that evaluation, and the importance of remaining in the ED until their evaluation is complete.     Peter Garter, [place] 08/10/22 1525

## 2022-08-10 NOTE — H&P (Signed)
History and Physical    Daryl [name] [id number] DOB: Dec 26, 1960 DOA: 08/10/2022  PCP: Pcp, No   Patient coming from: Home   Chief Complaint: SOB, cough, swelling   HPI: Daryl [name] is a 61 y.o. male with medical history significant for alcohol abuse in remission, nonobstructive CAD, chronic combined systolic and diastolic CHF, and poor adherence with medical treatment plan who now presents to the emergency department with progressive shortness of breath, cough, and swelling.  Patient reports insidious worsening and bilateral lower extremity swelling and then noticed shortness of breath 3 or 4 days ago that has also been worsening.  This is associated with a cough productive of thick white sputum.  He denies chest pain, fever, or chills.  He has not taken any medications in months.  He denies any recent alcohol use.  ED Course: Upon arrival to the ED, patient is found to be afebrile and saturating low 90s on room air with mild tachycardia and stable blood pressure.  Chest x-ray is notable for small right pleural effusion, cardiomegaly, and airspace opacity in the right base.  COVID, influenza, and RSV PCR are negative.  BNP is elevated at 1156.  Patient was treated with Rocephin, azithromycin, and IV Lasix in the ED.  Review of Systems:  All other systems reviewed and apart from HPI, are negative.  Past Medical History:  Diagnosis Date   Asthma    Chronic combined systolic and diastolic CHF (congestive heart failure) ([place])    Dyslipidemia    Essential hypertension 11/02/2018   ETOH abuse 02/14/2020   Nonobstructive CAD    Non-obstructive disease on cardiac cath in 08/2018   NSVT (nonsustained ventricular tachycardia) (HCC)    PNA (pneumonia) 09/11/2018   Sepsis ([place]) 09/11/2018   Stage 3a chronic kidney disease ([place]) 02/19/2020   Tobacco abuse 09/11/2018    Past Surgical History:  Procedure Laterality Date   IR THORACENTESIS ASP PLEURAL SPACE W/IMG GUIDE  09/13/2018    RIGHT/LEFT HEART CATH AND CORONARY ANGIOGRAPHY N/A 09/15/2018   Procedure: RIGHT/LEFT HEART CATH AND CORONARY ANGIOGRAPHY;  Surgeon: Leonie Man, MD;  Location: [facility];  Service: Cardiovascular;  Laterality: N/A;    Social History:   reports that he has been smoking cigarettes. He has been smoking an average of .5 packs per day. He has never used smokeless tobacco. He reports current alcohol use. He reports that he does not use drugs.  Allergies  Allergen Reactions   Penicillins Swelling    Did it involve swelling of the face/tongue/throat, SOB, or low BP? Yes Did it involve sudden or severe rash/hives, skin peeling, or any reaction on the inside of your mouth or nose? No Did you need to seek medical attention at a hospital or doctor's office? Yes When did it last happen?    young adult   If all above answers are "NO", may proceed with cephalosporin use.     History reviewed. No pertinent family history.   Prior to Admission medications   Medication Sig Start Date End Date Taking? Authorizing Provider  atorvastatin (LIPITOR) 40 MG tablet Take 1 tablet (40 mg total) by mouth daily. 05/17/22 08/15/22  Hayden Rasmussen, MD  dapagliflozin propanediol (FARXIGA) 10 MG TABS tablet Take 1 tablet (10 mg total) by mouth daily before breakfast. 05/17/22   Hayden Rasmussen, MD  doxycycline (VIBRAMYCIN) 100 MG capsule Take 1 capsule (100 mg total) by mouth 2 (two) times daily. 08/10/22   Horton, Barbette Hair, MD  furosemide (LASIX) 40 MG tablet Take 1 tablet (40 mg total) by mouth daily. 08/10/22   Horton, Barbette Hair, MD  gabapentin (NEURONTIN) 300 MG capsule Take 1 capsule (300 mg total) by mouth 3 (three) times daily. 05/17/22   Hayden Rasmussen, MD  metoprolol succinate (TOPROL-XL) 25 MG 24 hr tablet Take 1/2 tablet (12.5 mg total) by mouth daily. 05/17/22   Hayden Rasmussen, MD  spironolactone (ALDACTONE) 25 MG tablet Take 1 tablet (25 mg total) by mouth daily. 05/17/22   Hayden Rasmussen, MD  tamsulosin (FLOMAX) 0.4 MG CAPS capsule Take 1 capsule (0.4 mg total) by mouth daily. 04/19/22   Geradine Girt, DO  torsemide (DEMADEX) 20 MG tablet Take 2 tablets (40 mg total) by mouth daily. 05/19/22   Hayden Rasmussen, MD    Physical Exam: Vitals:   08/10/22 1502 08/10/22 2215  BP: (!) 142/79 97/64  Pulse: 94 (!) 109  Resp: 19 (!) 21  Temp: 98 F (36.7 C)   SpO2: 97% 90%    Constitutional: NAD, calm  Eyes: PERTLA, lids and conjunctivae normal ENMT: Mucous membranes are moist. Posterior pharynx clear of any exudate or lesions.   Neck: supple, no masses  Respiratory: no wheezing. No accessory muscle use.  Cardiovascular: S1 & S2 heard, regular rate and rhythm. Pretibial pitting edema bilaterally. Abdomen: No distension, no tenderness, soft. Bowel sounds active.  Musculoskeletal: no clubbing / cyanosis. No joint deformity upper and lower extremities.   Skin: no significant rashes, lesions, ulcers. Warm, dry, well-perfused. Neurologic: CN 2-12 grossly intact. Moving all extremities. Alert and oriented.  Psychiatric: Calm. Cooperative.    Labs and Imaging on Admission: I have personally reviewed following labs and imaging studies  CBC: Recent Labs  Lab 08/09/22 2340 08/10/22 1525  WBC 8.9 8.4  NEUTROABS 5.1 5.1  HGB 13.0 13.1  HCT 41.7 42.0  MCV 90.1 90.5  PLT 417* [id number]*   Basic Metabolic Panel: Recent Labs  Lab 08/09/22 2340 08/10/22 2220  NA 142 143  K 4.0 4.2  CL 112* 108  CO2 23 24  GLUCOSE 107* 108*  BUN 14 12  CREATININE 0.93 1.06  CALCIUM 8.7* 8.7*   GFR: Estimated Creatinine Clearance: 74.1 mL/min (by C-G formula based on SCr of 1.06 mg/dL). Liver Function Tests: Recent Labs  Lab 08/09/22 2340 08/10/22 2220  AST 48* 38  ALT 52* 40  ALKPHOS 138* 116  BILITOT 0.8 0.9  PROT 7.2 5.4*  ALBUMIN 3.9 3.2*   No results for input(s): "LIPASE", "AMYLASE" in the last 168 hours. No results for input(s): "AMMONIA" in the last 168  hours. Coagulation Profile: No results for input(s): "INR", "PROTIME" in the last 168 hours. Cardiac Enzymes: No results for input(s): "CKTOTAL", "CKMB", "CKMBINDEX", "TROPONINI" in the last 168 hours. BNP (last 3 results) No results for input(s): "PROBNP" in the last 8760 hours. HbA1C: No results for input(s): "HGBA1C" in the last 72 hours. CBG: No results for input(s): "GLUCAP" in the last 168 hours. Lipid Profile: No results for input(s): "CHOL", "HDL", "LDLCALC", "TRIG", "CHOLHDL", "LDLDIRECT" in the last 72 hours. Thyroid Function Tests: No results for input(s): "TSH", "T4TOTAL", "FREET4", "T3FREE", "THYROIDAB" in the last 72 hours. Anemia Panel: No results for input(s): "VITAMINB12", "FOLATE", "FERRITIN", "TIBC", "IRON", "RETICCTPCT" in the last 72 hours. Urine analysis:    Component Value Date/Time   COLORURINE STRAW (A) 10/01/2021 0330   APPEARANCEUR CLEAR 10/01/2021 0330   LABSPEC 1.005 10/01/2021 0330   PHURINE 6.0 10/01/2021 0330  GLUCOSEU NEGATIVE 10/01/2021 0330   HGBUR NEGATIVE 10/01/2021 0330   BILIRUBINUR NEGATIVE 10/01/2021 0330   KETONESUR NEGATIVE 10/01/2021 0330   PROTEINUR NEGATIVE 10/01/2021 0330   NITRITE NEGATIVE 10/01/2021 0330   LEUKOCYTESUR NEGATIVE 10/01/2021 0330   Sepsis Labs: @LABRCNTIP (procalcitonin:4,lacticidven:4) ) Recent Results (from the past 240 hour(s))  Resp panel by RT-PCR (RSV, Flu A&B, Covid) Anterior Nasal Swab     Status: None   Collection Time: 08/10/22 10:20 PM   Specimen: Anterior Nasal Swab  Result Value Ref Range Status   SARS Coronavirus 2 by RT PCR NEGATIVE NEGATIVE Final    Comment: (NOTE) SARS-CoV-2 target nucleic acids are NOT DETECTED.  The SARS-CoV-2 RNA is generally detectable in upper respiratory specimens during the acute phase of infection. The lowest concentration of SARS-CoV-2 viral copies this assay can detect is 138 copies/mL. A negative result does not preclude SARS-Cov-2 infection and should not be  used as the sole basis for treatment or other patient management decisions. A negative result may occur with  improper specimen collection/handling, submission of specimen other than nasopharyngeal swab, presence of viral mutation(s) within the areas targeted by this assay, and inadequate number of viral copies(<138 copies/mL). A negative result must be combined with clinical observations, patient history, and epidemiological information. The expected result is Negative.  Fact Sheet for Patients:  EntrepreneurPulse.com.au  Fact Sheet for Healthcare Providers:  IncredibleEmployment.be  This test is no t yet approved or cleared by the [place] FDA and  has been authorized for detection and/or diagnosis of SARS-CoV-2 by FDA under an Emergency Use Authorization (EUA). This EUA will remain  in effect (meaning this test can be used) for the duration of the COVID-19 declaration under Section 564(b)(1) of the Act, 21 U.S.C.section 360bbb-3(b)(1), unless the authorization is terminated  or revoked sooner.       Influenza A by PCR NEGATIVE NEGATIVE Final   Influenza B by PCR NEGATIVE NEGATIVE Final    Comment: (NOTE) The Xpert Xpress SARS-CoV-2/FLU/RSV plus assay is intended as an aid in the diagnosis of influenza from Nasopharyngeal swab specimens and should not be used as a sole basis for treatment. Nasal washings and aspirates are unacceptable for Xpert Xpress SARS-CoV-2/FLU/RSV testing.  Fact Sheet for Patients: EntrepreneurPulse.com.au  Fact Sheet for Healthcare Providers: IncredibleEmployment.be  This test is not yet approved or cleared by the [place] FDA and has been authorized for detection and/or diagnosis of SARS-CoV-2 by FDA under an Emergency Use Authorization (EUA). This EUA will remain in effect (meaning this test can be used) for the duration of the COVID-19 declaration under Section  564(b)(1) of the Act, 21 U.S.C. section 360bbb-3(b)(1), unless the authorization is terminated or revoked.     Resp Syncytial Virus by PCR NEGATIVE NEGATIVE Final    Comment: (NOTE) Fact Sheet for Patients: EntrepreneurPulse.com.au  Fact Sheet for Healthcare Providers: IncredibleEmployment.be  This test is not yet approved or cleared by the [place] FDA and has been authorized for detection and/or diagnosis of SARS-CoV-2 by FDA under an Emergency Use Authorization (EUA). This EUA will remain in effect (meaning this test can be used) for the duration of the COVID-19 declaration under Section 564(b)(1) of the Act, 21 U.S.C. section 360bbb-3(b)(1), unless the authorization is terminated or revoked.  Performed at [facility], [place] [address], [place], [place] [address]      Radiological Exams on Admission: DG Chest Portable 1 View  Result Date: 08/10/2022 CLINICAL DATA:  Shortness of breath EXAM: PORTABLE CHEST 1  VIEW COMPARISON:  Chest x-ray 08/09/2022 FINDINGS: The heart is enlarged. There is a small right pleural effusion. There are patchy airspace opacities in the right lung base. There is no pneumothorax or acute fracture. IMPRESSION: 1. Patchy airspace opacities in the right lung base may reflect infection or aspiration. 2. Small right pleural effusion. 3. Cardiomegaly. Electronically Signed   By: Darliss Cheney M.D.   On: 08/10/2022 21:48   DG Chest 2 View  Result Date: 08/09/2022 CLINICAL DATA:  Shortness of breath EXAM: CHEST - 2 VIEW COMPARISON:  05/17/2022 FINDINGS: Small right pleural effusion. Large area of right lower lobe consolidation. Left lung is clear. Normal cardiomediastinal contours. IMPRESSION: Probable right lower lobe pneumonia.  Small right pleural effusion. Electronically Signed   By: Deatra Robinson M.D.   On: 08/09/2022 23:44    EKG: Independently reviewed. Sinus tachycardia, rate 105, LVH with secondary  repolarization abnormality.   Assessment/Plan   1. Acute on chronic combined systolic & diastolic CHF  - Presents with SOB and swelling after being off of medications for months  - EF was 25-30% with grade 3 diastolic dysfunction, moderately reduced RV systolic function, and severe MR and TR in July 2023  - 40 mg IV Lasix administered in ED  - Continue diuresis with 40 mg IV Lasix q12h, monitor wt and I/Os, hold beta-blocker until better compensated    2. Pneumonia  - Started on Rocephin and azithromycin in ED  - Check strep pneumo and legionella antigens, treat with Rocephin and doxycycline, trend procalcitonin    3. HLD  - Resume Lipitor    DVT prophylaxis: Lovenox  Code Status: Full  Level of Care: Level of care: Telemetry Cardiac Family Communication: none present  Disposition Plan:  Patient is from: home  Anticipated d/c is to: Home Anticipated d/c date is: 08/13/22  Patient currently: Pending improved volume status and respiratory status, transition to oral medications  Consults called: none  Admission status: Inpatient     Briscoe Deutscher, MD [facility]  08/10/2022, 11:46 PM

## 2022-08-10 NOTE — ED Notes (Signed)
Called pt x3 for vitals, no response. 

## 2022-08-10 NOTE — ED Notes (Signed)
Pt given both verbal and written discharge instructions, pt refusing to sign for his instructions.

## 2022-08-10 NOTE — ED Notes (Addendum)
Pt. Refused IV and repeat troponin. stating "you don't know what you're doing." I don't want to be stuck anymore.

## 2022-08-10 NOTE — ED Notes (Signed)
Ambulated pt, saturations remained 93%, hr in the 90's

## 2022-08-11 DIAGNOSIS — I10 Essential (primary) hypertension: Secondary | ICD-10-CM

## 2022-08-11 DIAGNOSIS — J189 Pneumonia, unspecified organism: Secondary | ICD-10-CM | POA: Diagnosis not present

## 2022-08-11 DIAGNOSIS — I5043 Acute on chronic combined systolic (congestive) and diastolic (congestive) heart failure: Secondary | ICD-10-CM | POA: Diagnosis not present

## 2022-08-11 DIAGNOSIS — I5033 Acute on chronic diastolic (congestive) heart failure: Secondary | ICD-10-CM | POA: Diagnosis present

## 2022-08-11 LAB — TROPONIN I (HIGH SENSITIVITY): Troponin I (High Sensitivity): 21 ng/L — ABNORMAL HIGH (ref <18)

## 2022-08-11 LAB — BASIC METABOLIC PANEL
Anion gap: 8 (ref 5–15)
BUN: 12 mg/dL (ref 6–20)
CO2: 25 mmol/L (ref 22–32)
Calcium: 8.2 mg/dL — ABNORMAL LOW (ref 8.9–10.3)
Chloride: 107 mmol/L (ref 98–111)
Creatinine, Ser: 0.98 mg/dL (ref 0.61–1.24)
GFR, Estimated: >60 mL/min (ref >60)
Glucose, Bld: 107 mg/dL — ABNORMAL HIGH (ref 70–99)
Potassium: 3.9 mmol/L (ref 3.5–5.1)
Sodium: 140 mmol/L (ref 135–145)

## 2022-08-11 LAB — MAGNESIUM: Magnesium: 1.7 mg/dL (ref 1.7–2.4)

## 2022-08-11 LAB — STREP PNEUMONIAE URINARY ANTIGEN: Strep Pneumo Urinary Antigen: NEGATIVE

## 2022-08-11 LAB — CBC
HCT: 37 % — ABNORMAL LOW (ref 39.0–52.0)
Hemoglobin: 12 g/dL — ABNORMAL LOW (ref 13.0–17.0)
MCH: 28.5 pg (ref 26.0–34.0)
MCHC: 32.4 g/dL (ref 30.0–36.0)
MCV: 87.9 fL (ref 80.0–100.0)
Platelets: 399 10*3/uL (ref 150–400)
RBC: 4.21 MIL/uL — ABNORMAL LOW (ref 4.22–5.81)
RDW: 15 % (ref 11.5–15.5)
WBC: 7 10*3/uL (ref 4.0–10.5)
nRBC: 0 % (ref 0.0–0.2)

## 2022-08-11 LAB — PROCALCITONIN
Procalcitonin: <0.1 ng/mL
Procalcitonin: <0.1 ng/mL

## 2022-08-11 LAB — HIV ANTIBODY (ROUTINE TESTING W REFLEX): HIV Screen 4th Generation wRfx: NONREACTIVE

## 2022-08-11 MED ORDER — POTASSIUM CHLORIDE CRYS ER 20 MEQ PO TBCR
40.0000 meq | EXTENDED_RELEASE_TABLET | Freq: Every day | ORAL | Status: DC
  Administered 2022-08-11 – 2022-08-12 (×2): 40 meq via ORAL
  Filled 2022-08-11 (×2): qty 2

## 2022-08-11 MED ORDER — MAGNESIUM OXIDE -MG SUPPLEMENT 400 (240 MG) MG PO TABS
400.0000 mg | ORAL_TABLET | Freq: Once | ORAL | Status: AC
  Administered 2022-08-11: 400 mg via ORAL
  Filled 2022-08-11: qty 1

## 2022-08-11 NOTE — ED Notes (Addendum)
  Pt's sat remained 96-97% while ambulating.

## 2022-08-11 NOTE — Hospital Course (Signed)
Mr. Dickerman is a 61 y.o. M with NICM and sCHF EF 25-30 in July, alcohol abuse, cocaine abuse in remission, nonadherence, and HTN who presented with shortness of breath, lower extremity swelling and cough.  In the ER, CXR showed pleural effusion, edema and BNP >1000.    12/17: Admitted on antibiotics, diuretics

## 2022-08-11 NOTE — Assessment & Plan Note (Signed)
BP soft - Continue Lasix - Hold metop, spiro

## 2022-08-11 NOTE — Assessment & Plan Note (Addendum)
Possible - Trend procal - Continue CTX and doxy for now

## 2022-08-11 NOTE — Assessment & Plan Note (Signed)
No I/Os in the ER, weights not done.  Patient still dyspneic. - Continue IV Lasix - Hold metoprolol and spironolactone

## 2022-08-11 NOTE — Progress Notes (Signed)
  Progress Note   Patient: Daryl [name] IOX:[id number] DOB: 02-11-1961 DOA: 08/10/2022     1 DOS: the patient was seen and examined on 08/11/2022       Brief hospital course: Daryl [name] is a 61 y.o. M with NICM and sCHF EF 25-30 in July, alcohol abuse, cocaine abuse in remission, nonadherence, and HTN who presented with shortness of breath, lower extremity swelling and cough.  In the ER, CXR showed pleural effusion, edema and BNP >1000.    12/17: Admitted on antibiotics, diuretics     Assessment and Plan: * Acute on chronic combined systolic and diastolic CHF (congestive heart failure) (HCC) No I/Os in the ER, weights not done.  Patient still dyspneic. - Continue IV Lasix - Hold metoprolol and spironolactone  CAP (community acquired pneumonia) Possible - Trend procal - Continue CTX and doxy for now  Essential hypertension BP soft - Continue Lasix - Hold metop, spiro          Subjective: No complaints.  Leg cramps.     Physical Exam: BP (!) 106/58   Pulse 96   Temp 97.9 F (36.6 C) (Oral)   Resp 15   SpO2 96%   Adult male, lying in bed, in pain from leg cramps Cor regular, no murmurs, no LE edema, no JVD Respiratory rate normal, lungs clear without rales, diminished bilaterally   Data Reviewed: BMP and CBC normal  Family Communication: None    Disposition: Status is: Inpatient         Author: Alberteen Sam, MD 08/11/2022 4:19 PM  For on call review [email or web address].

## 2022-08-12 ENCOUNTER — Encounter (HOSPITAL_COMMUNITY): Payer: Self-pay | Admitting: Family Medicine

## 2022-08-12 ENCOUNTER — Other Ambulatory Visit (HOSPITAL_COMMUNITY): Payer: Self-pay

## 2022-08-12 LAB — CBC
HCT: 42 % (ref 39.0–52.0)
Hemoglobin: 13.3 g/dL (ref 13.0–17.0)
MCH: 28 pg (ref 26.0–34.0)
MCHC: 31.7 g/dL (ref 30.0–36.0)
MCV: 88.4 fL (ref 80.0–100.0)
Platelets: 422 10*3/uL — ABNORMAL HIGH (ref 150–400)
RBC: 4.75 MIL/uL (ref 4.22–5.81)
RDW: 14.6 % (ref 11.5–15.5)
WBC: 6.9 10*3/uL (ref 4.0–10.5)
nRBC: 0 % (ref 0.0–0.2)

## 2022-08-12 LAB — BASIC METABOLIC PANEL
Anion gap: 10 (ref 5–15)
BUN: 15 mg/dL (ref 6–20)
CO2: 23 mmol/L (ref 22–32)
Calcium: 8.5 mg/dL — ABNORMAL LOW (ref 8.9–10.3)
Chloride: 104 mmol/L (ref 98–111)
Creatinine, Ser: 1.05 mg/dL (ref 0.61–1.24)
GFR, Estimated: >60 mL/min (ref >60)
Glucose, Bld: 96 mg/dL (ref 70–99)
Potassium: 3.8 mmol/L (ref 3.5–5.1)
Sodium: 137 mmol/L (ref 135–145)

## 2022-08-12 LAB — PROCALCITONIN: Procalcitonin: <0.1 ng/mL

## 2022-08-12 MED ORDER — DAPAGLIFLOZIN PROPANEDIOL 10 MG PO TABS
10.0000 mg | ORAL_TABLET | Freq: Every day | ORAL | 3 refills | Status: AC
  Filled 2022-08-12: qty 30, 30d supply, fill #0

## 2022-08-12 MED ORDER — ATORVASTATIN CALCIUM 40 MG PO TABS
40.0000 mg | ORAL_TABLET | Freq: Every day | ORAL | 3 refills | Status: AC
  Filled 2022-08-12: qty 30, 30d supply, fill #0

## 2022-08-12 MED ORDER — FUROSEMIDE 40 MG PO TABS: 40.0000 mg | ORAL_TABLET | Freq: Every day | ORAL | 3 refills | Status: AC

## 2022-08-12 MED ORDER — SPIRONOLACTONE 25 MG PO TABS
25.0000 mg | ORAL_TABLET | Freq: Every day | ORAL | 3 refills | Status: AC
  Filled 2022-08-12: qty 30, 30d supply, fill #0

## 2022-08-12 MED ORDER — POTASSIUM CHLORIDE CRYS ER 20 MEQ PO TBCR
20.0000 meq | EXTENDED_RELEASE_TABLET | Freq: Every day | ORAL | 3 refills | Status: AC
  Filled 2022-08-12: qty 30, 30d supply, fill #0

## 2022-08-12 MED ORDER — TAMSULOSIN HCL 0.4 MG PO CAPS
0.4000 mg | ORAL_CAPSULE | Freq: Every day | ORAL | 3 refills | Status: AC
  Filled 2022-08-12: qty 30, 30d supply, fill #0

## 2022-08-12 MED ORDER — METOPROLOL SUCCINATE ER 25 MG PO TB24
12.5000 mg | ORAL_TABLET | Freq: Every day | ORAL | 3 refills | Status: AC
  Filled 2022-08-12: qty 15, 30d supply, fill #0

## 2022-08-12 NOTE — TOC Initial Note (Signed)
Transition of Care [facility]) - Initial/Assessment Note    Patient Details  Name: Daryl [name] MRN: [id number] Date of Birth: 1961-07-06  Transition of Care [facility]) CM/SW Contact:    Elliot Cousin, RN Phone Number: [phone] 08/12/2022, 11:57 AM  Clinical Narrative:                 TOC CM spoke to pt and states he is currently homeless. Provided pt with number for transportation with [employer] and 3 bus passes. Pt states he is familiar with [employer] but does not want to interact with organization. [facility] can assist pt with housing, food, and bathing. Provided contact number for [employer] to have shelter during cold nights. TOC CSW will speak to pt also.   Expected Discharge Plan: Homeless Shelter Barriers to Discharge: Continued Medical Work up   Patient Goals and CMS Choice        Expected Discharge Plan and Services Expected Discharge Plan: Homeless Shelter In-house Referral: Clinical Social Work Discharge Planning Services: CM Consult   Living arrangements for the past 2 months: Homeless Shelter, Homeless Expected Discharge Date: 08/12/22                                    Prior Living Arrangements/Services Living arrangements for the past 2 months: Homeless Shelter, Homeless Lives with:: Self Patient language and need for interpreter reviewed:: Yes        Need for Family Participation in Patient Care: No (Comment) Care giver support system in place?: No (comment)   Criminal Activity/Legal Involvement Pertinent to Current Situation/Hospitalization: No - Comment as needed  Activities of Daily Living      Permission Sought/Granted Permission sought to share information with : Case Manager                Emotional Assessment Appearance:: Appears stated age Attitude/Demeanor/Rapport: Engaged Affect (typically observed): Accepting Orientation: : Oriented to Self, Oriented to Place, Oriented to  Time, Oriented to Situation   Psych Involvement:  No (comment)  Admission diagnosis:  Acute on chronic diastolic CHF (congestive heart failure) (HCC) [I50.33] Acute on chronic combined systolic and diastolic CHF (congestive heart failure) (HCC) [I50.43] Community acquired pneumonia of right lower lobe of lung [J18.9] Acute on chronic congestive heart failure, unspecified heart failure type (HCC) [I50.9] Patient Active Problem List   Diagnosis Date Noted   Acute on chronic diastolic CHF (congestive heart failure) (HCC) 08/11/2022   CAP (community acquired pneumonia) 08/10/2022   Acute exacerbation of CHF (congestive heart failure) (HCC) 04/18/2022   Acute CHF (congestive heart failure) (HCC) 04/17/2022   Acute on chronic combined systolic and diastolic CHF (congestive heart failure) (HCC) 03/03/2022   Left-sided chest wall pain 03/03/2022   Hypokalemia 03/03/2022   Alcohol use 03/03/2022   Valvular heart disease 03/03/2022   Pleural effusion due to CHF (congestive heart failure) (HCC) 10/02/2021   Cocaine abuse (HCC) 10/01/2021   History of noncompliance with medical treatment 10/01/2021   Elevated LFTs 10/01/2021   Syncope and collapse 08/29/2021   Encounter to establish care 08/15/2021   Alcohol abuse with intoxication delirium (HCC)    Alcohol withdrawal delirium (HCC) 06/26/2021   Stage 3a chronic kidney disease (HCC) 02/19/2020   ETOH abuse 02/14/2020   Essential hypertension 11/02/2018   Nonischemic cardiomyopathy (HCC)    Chronic combined systolic and diastolic CHF (congestive heart failure) (HCC)    Tobacco use  disorder 09/11/2018   PCP:  Pcp, No Pharmacy:   [facility] [address], [address] [place] [address] Phone: [phone] Fax: [phone]  [facility] [address] [place] [address] Phone: [phone] Fax: [phone]  [facility] [address] [place] [address] Phone:  [phone] Fax: [phone]     Social Determinants of Health (SDOH) Interventions Housing Interventions: Other (Comment) (CSW/CSm) Alcohol Usage Interventions: Intervention Not Indicated (Score <7) Financial Strain Interventions: Intervention Not Indicated  Readmission Risk Interventions    10/02/2021   10:01 AM  Readmission Risk Prevention Plan  Transportation Screening Complete  Medication Review (RN Care Manager) Complete  PCP or Specialist appointment within 3-5 days of discharge Complete  HRI or Home Care Consult Complete  SW Recovery Care/Counseling Consult Complete  Palliative Care Screening Not Applicable  Skilled Nursing Facility Not Applicable

## 2022-08-12 NOTE — Discharge Summary (Signed)
Physician Discharge Summary   Patient: Daryl [name] MRN: [id number] DOB: August 04, 1961  Admit date:     08/10/2022  Discharge date: 08/12/22  Discharge Physician: Alberteen Sam   PCP: Pcp, No     Recommendations at discharge:  Follow up with CHF clinic      Discharge Diagnoses: Principal Problem:   Acute on chronic combined systolic and diastolic CHF (congestive heart failure) (HCC) Active Problems:   Essential hypertension   CAP (community acquired pneumonia) ruled out     Hospital Course: Mr. Daryl [name] is a 61 y.o. M with NICM and sCHF EF 25-30 in July, alcohol abuse, cocaine abuse in remission, nonadherence, and HTN who presented with shortness of breath, lower extremity swelling and cough.  In the ER, CXR showed pleural effusion, edema and BNP >1000.       * Acute on chronic combined systolic and diastolic CHF (congestive heart failure) (HCC) Patient admitted and treated with IV Lasix.    Ambulated on room air without dyspnea or hypoxia.  Refills of medications provided at discharge.  TOC SW able to provide PCP follow up, medication assistance, and Cardiology follow up all at the time of discharge.    CAP (community acquired pneumonia) ruled out Procal low.  Suspect this was all CHF.  Antibiotics stopped.              [facility] Controlled Substances Registry was reviewed for this patient prior to discharge.  Consultants: None Procedures performed: None  Disposition: Homeless   DISCHARGE MEDICATION: Allergies as of 08/12/2022       Reactions   Penicillins Swelling   Did it involve swelling of the face/tongue/throat, SOB, or low BP? Yes Did it involve sudden or severe rash/hives, skin peeling, or any reaction on the inside of your mouth or nose? No Did you need to seek medical attention at a hospital or doctor's office? Yes When did it last happen?    young adult   If all above answers are "NO", may proceed with cephalosporin  use.        Medication List     STOP taking these medications    doxycycline 100 MG capsule Commonly known as: VIBRAMYCIN   gabapentin 300 MG capsule Commonly known as: NEURONTIN   torsemide 20 MG tablet Commonly known as: DEMADEX       TAKE these medications    atorvastatin 40 MG tablet Commonly known as: LIPITOR Take 1 tablet (40 mg total) by mouth daily.   Farxiga 10 MG Tabs tablet Generic drug: dapagliflozin propanediol Take 1 tablet (10 mg total) by mouth daily before breakfast.   furosemide 40 MG tablet Commonly known as: LASIX Take 1 tablet (40 mg total) by mouth daily.   metoprolol succinate 25 MG 24 hr tablet Commonly known as: TOPROL-XL Take 1/2 tablet (12.5 mg total) by mouth daily.   potassium chloride SA 20 MEQ tablet Commonly known as: KLOR-CON M Take 1 tablet (20 mEq total) by mouth daily. Start taking on: August 13, 2022   spironolactone 25 MG tablet Commonly known as: ALDACTONE Take 1 tablet (25 mg total) by mouth daily.   tamsulosin 0.4 MG Caps capsule Commonly known as: FLOMAX Take 1 capsule (0.4 mg total) by mouth daily.        Follow-up Information     [facility] Follow up.   Specialty: Cardiovascular Intensive Care Contact information: [address] [id number] Wilhemina [name] [place] [address] [phone]  [facility]. Go in 15 day(s).   Specialty: Cardiology Why: Hospital follow up 08/28/22 PLEASE bring a current medication list to appointment FREE valet parking, Entrance C, off [employer] information: [address] [id number] [name] Follow up.   Why: PCP appointment: Daryl [name] 08-21-22 @ 2:00 pm for hospital follow up appointment. CIty Block will call the patient with transportation information. Contact information: [place]  [place] [address]                Discharge Instructions     Increase activity slowly   Complete by: As directed        Discharge Exam: Filed Weights   08/11/22 2037 08/11/22 2327 08/12/22 0429  Weight: 72 kg 71.3 kg 71.3 kg    General: Pt is alert, awake, not in acute distress Cardiovascular: RRR, nl S1-S2, no murmurs appreciated.   No LE edema.   Respiratory: Normal respiratory rate and rhythm.  CTAB without rales or wheezes. Abdominal: Abdomen soft and non-tender.  No distension or HSM.   Neuro/Psych: Strength symmetric in upper and lower extremities.  Judgment and insight appear normal.   Condition at discharge: good  The results of significant diagnostics from this hospitalization (including imaging, microbiology, ancillary and laboratory) are listed below for reference.   Imaging Studies: DG Chest Portable 1 View  Result Date: 08/10/2022 CLINICAL DATA:  Shortness of breath EXAM: PORTABLE CHEST 1 VIEW COMPARISON:  Chest x-ray 08/09/2022 FINDINGS: The heart is enlarged. There is a small right pleural effusion. There are patchy airspace opacities in the right lung base. There is no pneumothorax or acute fracture. IMPRESSION: 1. Patchy airspace opacities in the right lung base may reflect infection or aspiration. 2. Small right pleural effusion. 3. Cardiomegaly. Electronically Signed   By: Ronney Asters M.D.   On: 08/10/2022 21:48   DG Chest 2 View  Result Date: 08/09/2022 CLINICAL DATA:  Shortness of breath EXAM: CHEST - 2 VIEW COMPARISON:  05/17/2022 FINDINGS: Small right pleural effusion. Large area of right lower lobe consolidation. Left lung is clear. Normal cardiomediastinal contours. IMPRESSION: Probable right lower lobe pneumonia.  Small right pleural effusion. Electronically Signed   By: Ulyses Jarred M.D.   On: 08/09/2022 23:44    Microbiology: Results for orders placed or performed during the hospital encounter of 08/10/22  Resp panel by RT-PCR (RSV, Flu A&B,  Covid) Anterior Nasal Swab     Status: None   Collection Time: 08/10/22 10:20 PM   Specimen: Anterior Nasal Swab  Result Value Ref Range Status   SARS Coronavirus 2 by RT PCR NEGATIVE NEGATIVE Final    Comment: (NOTE) SARS-CoV-2 target nucleic acids are NOT DETECTED.  The SARS-CoV-2 RNA is generally detectable in upper respiratory specimens during the acute phase of infection. The lowest concentration of SARS-CoV-2 viral copies this assay can detect is 138 copies/mL. A negative result does not preclude SARS-Cov-2 infection and should not be used as the sole basis for treatment or other patient management decisions. A negative result may occur with  improper specimen collection/handling, submission of specimen other than nasopharyngeal swab, presence of viral mutation(s) within the areas targeted by this assay, and inadequate number of viral copies(<138 copies/mL). A negative result must be combined with clinical observations, patient history, and epidemiological information. The expected result is Negative.  Fact Sheet for Patients:  EntrepreneurPulse.com.au  Fact Sheet  for Healthcare Providers:  IncredibleEmployment.be  This test is no t yet approved or cleared by the [place] and  has been authorized for detection and/or diagnosis of SARS-CoV-2 by FDA under an Emergency Use Authorization (EUA). This EUA will remain  in effect (meaning this test can be used) for the duration of the COVID-19 declaration under Section 564(b)(1) of the Act, 21 U.S.C.section 360bbb-3(b)(1), unless the authorization is terminated  or revoked sooner.       Influenza A by PCR NEGATIVE NEGATIVE Final   Influenza B by PCR NEGATIVE NEGATIVE Final    Comment: (NOTE) The Xpert Xpress SARS-CoV-2/FLU/RSV plus assay is intended as an aid in the diagnosis of influenza from Nasopharyngeal swab specimens and should not be used as a sole basis for treatment. Nasal  washings and aspirates are unacceptable for Xpert Xpress SARS-CoV-2/FLU/RSV testing.  Fact Sheet for Patients: EntrepreneurPulse.com.au  Fact Sheet for Healthcare Providers: IncredibleEmployment.be  This test is not yet approved or cleared by the [place] FDA and has been authorized for detection and/or diagnosis of SARS-CoV-2 by FDA under an Emergency Use Authorization (EUA). This EUA will remain in effect (meaning this test can be used) for the duration of the COVID-19 declaration under Section 564(b)(1) of the Act, 21 U.S.C. section 360bbb-3(b)(1), unless the authorization is terminated or revoked.     Resp Syncytial Virus by PCR NEGATIVE NEGATIVE Final    Comment: (NOTE) Fact Sheet for Patients: EntrepreneurPulse.com.au  Fact Sheet for Healthcare Providers: IncredibleEmployment.be  This test is not yet approved or cleared by the [place] FDA and has been authorized for detection and/or diagnosis of SARS-CoV-2 by FDA under an Emergency Use Authorization (EUA). This EUA will remain in effect (meaning this test can be used) for the duration of the COVID-19 declaration under Section 564(b)(1) of the Act, 21 U.S.C. section 360bbb-3(b)(1), unless the authorization is terminated or revoked.  Performed at [facility], [place] [address], [place], [place] [address]     Labs: CBC: Recent Labs  Lab 08/09/22 2340 08/10/22 1525 08/11/22 0411 08/12/22 0702  WBC 8.9 8.4 7.0 6.9  NEUTROABS 5.1 5.1  --   --   HGB 13.0 13.1 12.0* 13.3  HCT 41.7 42.0 37.0* 42.0  MCV 90.1 90.5 87.9 88.4  PLT 417* 422* 399 [id number]*   Basic Metabolic Panel: Recent Labs  Lab 08/09/22 2340 08/10/22 2220 08/11/22 0411 08/12/22 0702  NA 142 143 140 137  K 4.0 4.2 3.9 3.8  CL 112* 108 107 104  CO2 23 24 25 23   GLUCOSE 107* 108* 107* 96  BUN 14 12 12 15   CREATININE 0.93 1.06 0.98 1.05  CALCIUM 8.7* 8.7* 8.2*  8.5*  MG  --   --  1.7  --    Liver Function Tests: Recent Labs  Lab 08/09/22 2340 08/10/22 2220  AST 48* 38  ALT 52* 40  ALKPHOS 138* 116  BILITOT 0.8 0.9  PROT 7.2 5.4*  ALBUMIN 3.9 3.2*   CBG: No results for input(s): "GLUCAP" in the last 168 hours.  Discharge time spent: approximately 35 minutes spent on discharge counseling, evaluation of patient on day of discharge, and coordination of discharge planning with nursing, social work, pharmacy and case management  Signed: Edwin Dada, MD [facility] 08/12/2022

## 2022-08-12 NOTE — Progress Notes (Signed)
Patient ambulated to unit with his cane from ED accompanied by ED RN. Patient stated feeling somewhat short of breath from ambulation but no visual signs of stress, and quickly settled down in room. Patient states not really feeling worried about his breathing or swelling and is most interested in having his feet looked at. Pointed to a few calloused spots on soles that he states have been giving him intermittent pain for weeks. Somewhat seriously asked for hot water to soak them in and a razor so he could cut the rough spots off. Patient eager to leave as soon as he can. Told patient that he will be able to speak to physicians in the morning. 6 Beat run of Vtach; asymptomatic. Refused labs and asked them to come back later. Patient sleeping.

## 2022-08-12 NOTE — Progress Notes (Signed)
Heart Failure Nurse Navigator Progress Note  PCP: Pcp, No PCP-Cardiologist: Hochrein Admission Diagnosis: Community acquired pneumonia, Acute on chronic congestive heart failure Admitted from: Home  Presentation:   [name] presented with shortness of breath, Lower extremity edema, cough, CXR with cardiomegaly, aspiration, infection and small right pleural effusion. Patient was originally referred to Dr. Shirlee Latch 10/14/21, for a cardiac MRI, he was unable to be reached for further appointments. Patient has a history of ETOH and substance abuse and challenging social needs. He know states he will be going to stay with his friend after discharge. Patient was educated on the sign and symptoms of heart failure, daily weights, diet/ fluid restrictions, substance cessation, taking all his medications as prescribed and attending his medical appointments, patient verbalized his understanding and stated he wants to do better. A HF TOC appointment was scheduled for 08/28/22.  ECHO/ LVEF: 25-30% G3DD  Clinical Course:  Past Medical History:  Diagnosis Date   Asthma    Chronic combined systolic and diastolic CHF (congestive heart failure) (HCC)    Dyslipidemia    Essential hypertension 11/02/2018   ETOH abuse 02/14/2020   Nonobstructive CAD    Non-obstructive disease on cardiac cath in 08/2018   NSVT (nonsustained ventricular tachycardia) (HCC)    PNA (pneumonia) 09/11/2018   Sepsis (HCC) 09/11/2018   Stage 3a chronic kidney disease (HCC) 02/19/2020   Tobacco abuse 09/11/2018     Social History   Socioeconomic History   Marital status: Single    Spouse name: Not on file   Number of children: Not on file   Years of education: Not on file   Highest education level: Not on file  Occupational History   Occupation: unemployed  Tobacco Use   Smoking status: Every Day    Packs/day: 0.50    Types: Cigarettes   Smokeless tobacco: Never  Vaping Use   Vaping Use: Never used  Substance and  Sexual Activity   Alcohol use: Yes   Drug use: Never   Sexual activity: Not on file  Other Topics Concern   Not on file  Social History Narrative   Not on file   Social Determinants of Health   Financial Resource Strain: High Risk (08/12/2022)   Overall Financial Resource Strain (CARDIA)    Difficulty of Paying Living Expenses: Hard  Food Insecurity: Food Insecurity Present (10/14/2021)   Hunger Vital Sign    Worried About Running Out of Food in the Last Year: Sometimes true    Ran Out of Food in the Last Year: Sometimes true  Transportation Needs: Unmet Transportation Needs (08/12/2022)   PRAPARE - Administrator, Civil Service (Medical): Yes    Lack of Transportation (Non-Medical): Yes  Physical Activity: Not on file  Stress: Not on file  Social Connections: Not on file    High Risk Criteria for Readmission and/or Poor Patient Outcomes: Heart failure hospital admissions (last 6 months): 3  No Show rate: 23 % Difficult social situation: yes Demonstrates medication adherence: No Primary Language: English Literacy level: Reading, writing, and comprehension  Barriers of Care:   Medication compliance Diet/ fluid restrictions Transportation issues ( has Medicaid) resources given   Considerations/Referrals:   Referral made to Heart Failure Pharmacist Stewardship: yes Referral made to Heart Failure CSW/NCM TOC: yes Referral made to Heart & Vascular TOC clinic: yes, 08/28/22  Items for Follow-up on DC/TOC: Medication compliance Transportation issues, Resources given Diet/ fluid restrictions/ substance cessation Continued HF education   [employer], BSN,  RN Heart Failure Nurse Navigator Secure Chat Only

## 2022-08-12 NOTE — Progress Notes (Addendum)
CSW received consult for patient homeless,food resources, transportation. CSW spoke with patient at bedside. Patient reports he is homeless. CSW offered patient area shelter list, food resources, and transportation resources. Patient informed CSW he would think about it. CSW informed patient if he decides he would like resources to let his RN know and CSW will drop resources offered back by his room.All questions answered. No further questions reported at this time.Per CM on 6east, Case Manager Wellington Hampshire is trying to get paperwork submitted to [facility] for a hotel room for the patient. Aurther Loft is aware that the patient will be near the [facility]  once discharged and he has a cell phone [phone]. Aurther Loft will call CM on 6east back with the information.   CM informed CSW that bus pass already provided to patient. Per MD plan for patient to dc today.

## 2022-08-12 NOTE — Care Management (Addendum)
  Transition of Care [facility]) Screening Note   Patient Details  Name: Daryl [name] Date of Birth: [birth year]   Transition of Care [facility]) CM/SW Contact:    Gala Lewandowsky, RN Phone Number: 08/12/2022, 10:34 AM    Transition of Care Department [facility]) has reviewed the patient. Heart Failure Navigator has assisted with HF follow up appointment. Medications have been sent to [facility] and PCP appointment has been established with the internal Medicine Clinic for 08-29-21 @ 1015. Information can be found on the AVS. Bus passes have been provided for transportation. Plan will be for patient to discharge today.  1113 08-12-22 Case Manager spoke with [facility] Case Manager Wellington Hampshire- patient has a PCP Vonna Drafts- appointment established 08-21-22 @ 2:00 pm. [facility] to provide transportation. Case Manager Wellington Hampshire is trying to get paperwork submitted to [facility] for a hotel room for the patient. Aurther Loft is aware that the patient will be near the [facility]  once discharged and he has a cell phone [phone]. Aurther Loft will call this Case Manager back with the information. Case Manager to cancel the appointment at the Internal Medicine Clinic. No further needs identified.

## 2022-08-12 NOTE — Progress Notes (Signed)
   Heart Failure Stewardship Pharmacist Progress Note   PCP: Pcp, No PCP-Cardiologist: Rollene Rotunda, MD    HPI:  61 yo M with PMH of HFrEF, HLD, alcohol use, noncompliance, and HTN.   Hospitalized in 08/2018 and found to have acute HFrEF. Placed on coreg, bidil, lasix, and aldactone. Cardiology was consulted, underwent LHC on 09/15/2018, he had no significant CAD.  Likely his CHF is due to underlying nonischemic cardiomyopathy caused either by alcohol abuse or viral myocarditis.   Hospitalized several times with acute on chronic CHF secondary to medication noncompliance and ETOH/cocaine use.   Came for HF TOC follow up on 10/14/21. GDMT optimized and referred to Dr Shirlee Latch with eventual cardiac MRI. He was not able to be reached to schedule further appts in HF clinic.   Presented to the ED on 12/17 with shortness of breath, LE edema, and cough. CXR with cardiomegaly, infection/aspiration, and small R pleural effusion. Last ECHO from 7/11 showed LVEF 25-30%, global hypokinesis, G3DD, RV moderately reduced.  Discharge HF Medications: Diuretic: furosemide 40 mg daily Beta Blocker: metoprolol XL 12.5 mg daily MRA: spironolactone 25 mg daily SGLT2i: Farxiga 10 mg daily  Prior to admission HF Medications: None - has not been taking any meds (affordability concerns)  Pertinent Lab Values: Serum creatinine 1.05, BUN 15, Potassium 3.8, Sodium 137, BNP 1156.1, Magnesium 1.7  Vital Signs: Weight: 157 lbs (admission weight: 157 lbs) Blood pressure: 110/70s  Heart rate: 90s  I/O: not well documented  Medication Assistance / Insurance Benefits Check: Does the patient have prescription insurance?  Yes Type of insurance plan: [place] Medicaid  Outpatient Pharmacy:  Prior to admission outpatient pharmacy: [facility] Is the patient willing to use [facility] TOC pharmacy at discharge? Yes   Assessment: 1. Acute on chronic systolic CHF (LVEF 20-25%), due to NICM. NYHA class II symptoms. - Restarted  GDMT (furosemide, metoprolol XL, spironolactone, and [place]) - Patient eager to go home - Can consider starting Entresto at follow up after assessing compliance to meds   Plan: 1) Medication changes recommended at this time: - Discharge today, restarted HF GDMT  2) Education - Patient has been educated on current HF medications and potential additions to HF medication regimen - Patient verbalizes understanding that over the next few months, these medication doses may change and more medications may be added to optimize HF regimen - Patient has been educated on basic disease state pathophysiology and goals of therapy  Sharen Hones, PharmD, BCPS Heart Failure Stewardship Pharmacist Phone [phone]

## 2022-08-13 LAB — LEGIONELLA PNEUMOPHILA SEROGP 1 UR AG: L. pneumophila Serogp 1 Ur Ag: NEGATIVE

## 2022-08-25 DEATH — deceased

## 2022-08-27 ENCOUNTER — Other Ambulatory Visit (HOSPITAL_COMMUNITY): Payer: Self-pay

## 2022-08-27 ENCOUNTER — Ambulatory Visit: Payer: Medicaid Other | Admitting: Nurse Practitioner

## 2022-08-28 ENCOUNTER — Telehealth (HOSPITAL_COMMUNITY): Payer: Self-pay | Admitting: *Deleted

## 2022-08-28 ENCOUNTER — Ambulatory Visit (HOSPITAL_COMMUNITY): Payer: Medicaid Other

## 2022-08-28 NOTE — Telephone Encounter (Signed)
Heart Failure Nurse Navigator Progress Note   Unable to leave appointment reminder message for 08/28/22 @ 12 noon.   Earnestine Leys, BSN, Clinical cytogeneticist Only

## 2022-08-28 NOTE — Progress Notes (Incomplete)
HEART & VASCULAR TRANSITION OF CARE CONSULT NOTE     Referring Physician:Dr Hochrein  Primary Care: Primary Cardiologist:Dr Hochrein   HPI: Referred to clinic by Dr Percival Spanish for heart failure consultation.   [name] is a 62 y.o. with a histoy of HFrEF, NICM,  HIV, CKD Stage IIIb, cocaine abuse/ETOH abuse, and HTN.  Admitted w/ acute CHF in 2020. He had presented with sepsis and acute respiratory failure with hypoxia in setting of rhinovirus infection superimposed with bacterial infection. Echo showed EF 20%, mod-severe MR and mildly reduced RV. LHC showed angiographic evidence of coronary spasm relieved with nitroglycerin in the mid and distal RCA but otherwise minimal disease in the LAD. Per notes, his cardiomyopathy was felt caused by ETOH or viral myocarditis, though has not had a cardiac MRI.   Chest CT in 2020 showed no adenopathy or mass. + emphysema.   He has been followed by Dr. Percival Spanish but pt has not been fully compliant w/ meds. Also h/o syncope and NSVT. Was previously ordered to wear Zio for monitoring but pt did not comply.   Admitted 09/30/21 w/ a/c CHF. BNP 3,514. UDS + cocaine. Echo EF 20-25%, GIIIDD, restrictive, severe BAE, mod-severe MR and mildly reduced RV. Abdominal US w/ findings c/w cirrhosis with small perihepatic ascites. Demanded discharge and therefore sent home on limited GDMT.  He was seen in [facility] clinic in February 2023. He was volume overloaded. Very reticent to take medications. Diuretics adjusted and he was started on spiro and farxiga. He was referred to [facility] but no showed the appointment as well as subsequent visits with his Cardiologist, Dr. Percival Spanish.  He was readmitted in July 2023 with a/c CHF. Had been out of all of his meds for several months. He was diuresed with IV lasix and had right-sided pleural effusion s/p thoracentesis.   He's had multiple ED visits over the last few months for various complaints including a/c  CHF.  Admitted to [facility] 12/23 with a/c CHF and possible PNA. He had been out of all of his meds. Treated with IV lasix and restarted GDMT. He was referred back to [facility] clinic at discharge.  Here today for hospital follow-up.    PMH: 1. H/o NSVT 2. HTN 3. Hyperlipidemia 4. ETOH abuse 5. Cocaine abuse 6. COPD: Emphysema on CT chest.  Active smoker.  7. 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  8. Chronic systolic CHF: Nonischemic cardiomyopathy. - Echo (2020): EF 20% - LHC (1/20): No significant CAD.  - Echo (2020): EF 20-25% - Echo (2/23): EF 20-25%, global hypokinesis, mildly decreased RV function with moderate RVE, severe biatrial enlargement, moderate-severe functional MR, severe TR - Echo 07/23: EF 25-30%, grade III DD, RV severely enlarged with moderately reduced function, RVSP 44 mmHg, severe BAE, moderate to severe MR  Review of Systems: All systems reviewed and negative except as per HPI.  Current Outpatient Medications  Medication Sig Dispense Refill   atorvastatin (LIPITOR) 40 MG tablet Take 1 tablet (40 mg total) by mouth daily. 30 tablet 3   dapagliflozin propanediol (FARXIGA) 10 MG TABS tablet Take 1 tablet (10 mg total) by mouth daily before breakfast. 30 tablet 3   furosemide (LASIX) 40 MG tablet Take 1 tablet (40 mg total) by mouth daily. 30 tablet 3   metoprolol succinate (TOPROL-XL) 25 MG 24 hr tablet Take 1/2 tablet (12.5 mg total) by mouth daily. 15 tablet 3   potassium chloride SA (KLOR-CON M) 20 MEQ tablet Take 1  tablet (20 mEq total) by mouth daily. 30 tablet 3   spironolactone (ALDACTONE) 25 MG tablet Take 1 tablet (25 mg total) by mouth daily. 30 tablet 3   tamsulosin (FLOMAX) 0.4 MG CAPS capsule Take 1 capsule (0.4 mg total) by mouth daily. 30 capsule 3   No current facility-administered medications for this visit.    Allergies  Allergen Reactions   Penicillins Swelling    Did it involve swelling of the face/tongue/throat, SOB, or low BP? Yes Did it  involve sudden or severe rash/hives, skin peeling, or any reaction on the inside of your mouth or nose? No Did you need to seek medical attention at a hospital or doctor's office? Yes When did it last happen?    young adult   If all above answers are "NO", may proceed with cephalosporin use.       Social History   Socioeconomic History   Marital status: Single    Spouse name: Not on file   Number of children: Not on file   Years of education: Not on file   Highest education level: Not on file  Occupational History   Occupation: unemployed  Tobacco Use   Smoking status: Every Day    Packs/day: 0.50    Types: Cigarettes   Smokeless tobacco: Never  Vaping Use   Vaping Use: Never used  Substance and Sexual Activity   Alcohol use: Yes   Drug use: Never   Sexual activity: Not on file  Other Topics Concern   Not on file  Social History Narrative   Not on file   Social Determinants of Health   Financial Resource Strain: High Risk (08/12/2022)   Overall Financial Resource Strain (CARDIA)    Difficulty of Paying Living Expenses: Hard  Food Insecurity: Food Insecurity Present (10/14/2021)   Hunger Vital Sign    Worried About Running Out of Food in the Last Year: Sometimes true    Ran Out of Food in the Last Year: Sometimes true  Transportation Needs: Unmet Transportation Needs (08/12/2022)   PRAPARE - Hydrologist (Medical): Yes    Lack of Transportation (Non-Medical): Yes  Physical Activity: Not on file  Stress: Not on file  Social Connections: Not on file  Intimate Partner Violence: Not on file     No family history on file.  There were no vitals filed for this visit.   PHYSICAL EXAM: ReDS 44%  General:  thin AAM. No respiratory difficulty HEENT: normal Neck: supple. JVD elevated to ear. Carotids 2+ bilat; no bruits. No lymphadenopathy or thryomegaly appreciated. Cor: PMI nondisplaced. Regular rhythm, mildly tachy. 3/6 MR Lungs:  decreased BS at the bases bilaterally  Abdomen: soft, nontender, mildly distended. No hepatosplenomegaly. No bruits or masses. Good bowel sounds. Extremities: no cyanosis, clubbing, rash, edema Neuro: alert & oriented x 3, cranial nerves grossly intact. moves all 4 extremities w/o difficulty. Affect pleasant.  ECG: Sinus tach 102 bpm    ASSESSMENT & PLAN:  Acute on Chronic Biventricular Heart Failure - NICM - Echo 2020 EF 20-25% mildly reduced RV - LHC 2020 m-dRCA coronary spasm relieved w/ NTG, otherwise minimal disease in the LAD - Echo 2/23 EF 20%, GIIIDD (restrictive), RV mildly reduced  - Echo 07/23: EF 25-30%, GIII DD, RV severely enlarged with moderately reduced function, RVSP 44 mmHg, severe BAE, moderate to severe MR, moderate to severe TR - CM felt 2/2 substance abuse vs viral myocarditis, though has never had cMRI  Functional Class: NYHA  III -Diuretic- Torsemide 40 mg daily  -ARNI/ARB- no yet, BP soft  -? blocker - continue Toprol XL 12.5 mg daily  -MRA - Add Spiro 12.5 mg daily  -SGLT2i Add Farxiga 10 mg daily  - Noncompliance is a major barrier to care. - cessation from cocaine and ETOH imperative  - given h/o poor compliance and recent substance use, not a current candidate for advanced therapies (VAD or transplant)   2. Mitral Regurgitation/ Tricuspid regurgitation - Mod-severe MR and moderate to severe TR on echo 07/23. EF 25-30%. - HF optimization per above   3. Polysubstance Use - Cocaine and ETOH use.  - needs to quit   4. HTN  5. CAD - minimal CAD on cath in 2020 - on statin   6. SDOH - h/o poor compliance.  - HF team SW to see today  - needs PCP, refer to [facility]       Referred to HFSW (PCP, Medications, Transportation Drug Abuse, Financial ): Yes  Refer to Pharmacy: Yes Refer to Home Health:  No Refer to Advanced Heart Failure Clinic: *** Refer to General Cardiology: ***  Amelda Hapke N, PA-C   Follow up  ***

## 2022-08-29 ENCOUNTER — Ambulatory Visit: Payer: Medicaid Other | Admitting: Student

## 2023-09-22 IMAGING — CT CT HEAD W/O CM
3 of 6 series · 16 of 47 positions shown, 19 images · non-contrast
Comparison: Head MRI 01/16/2014

CLINICAL DATA: Mental status change, unknown cause.  Alcohol abuse.

EXAM:
CT HEAD WITHOUT CONTRAST
TECHNIQUE: Contiguous axial images were obtained from the base of the skull
through the vertex without intravenous contrast.

[Series 2: head w o · axial · 0.46mm/px · z∈[+1707,+1857]mm · 10 of 36 slices shown, 13 images]
[im 3/36  brain]
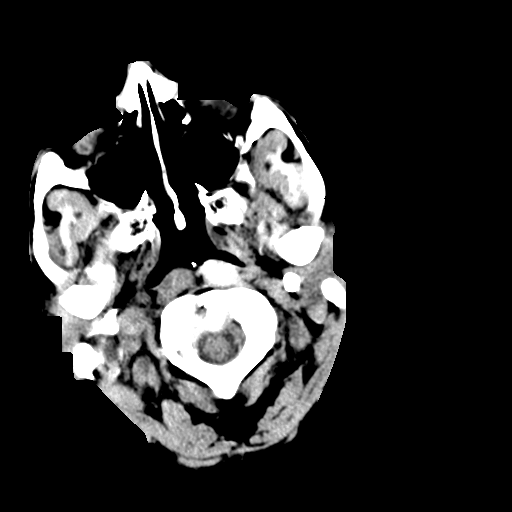
[im 3/36  bone]
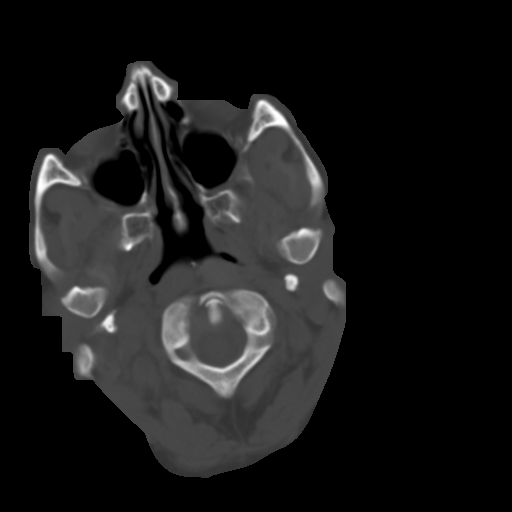
[im 7/36  brain]
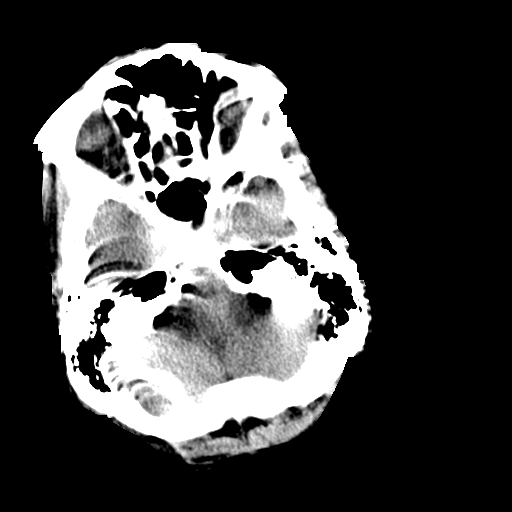
[im 9/36  brain]
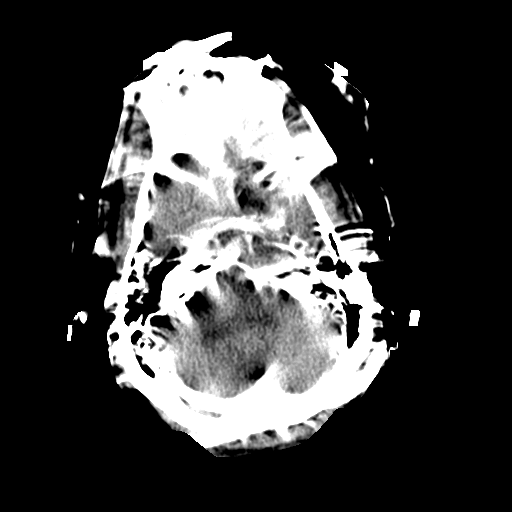
[im 14/36  brain]
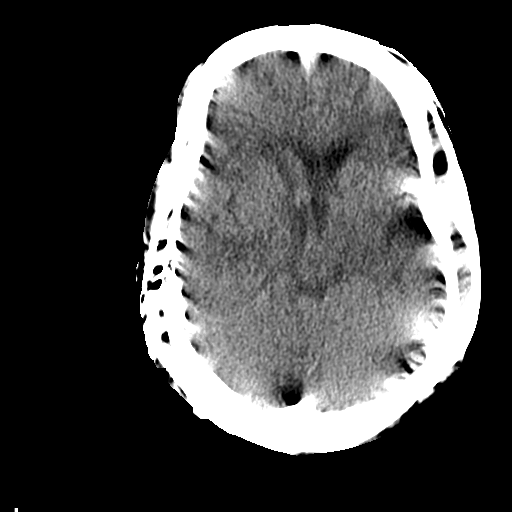
[im 16/36  brain]
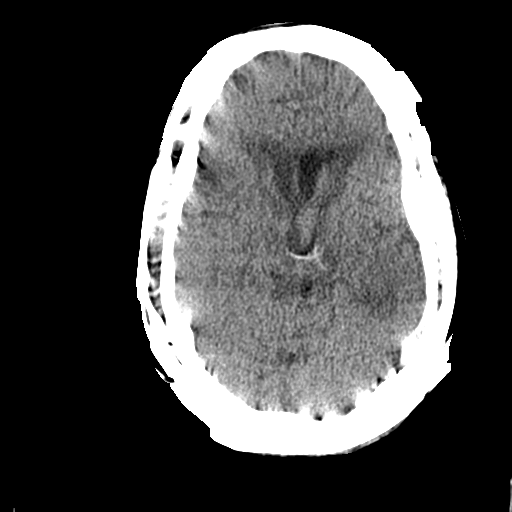
[im 16/36  bone]
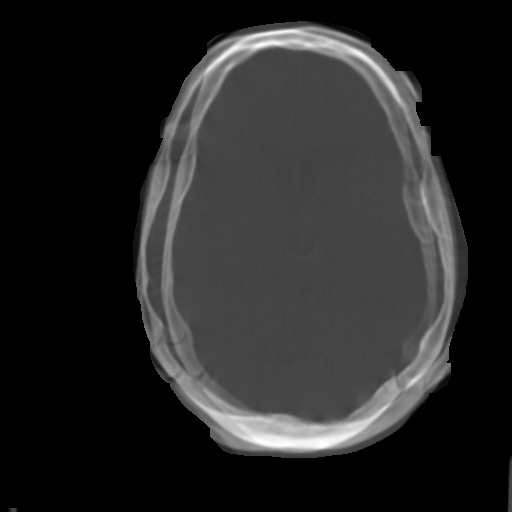
[im 20/36  brain]
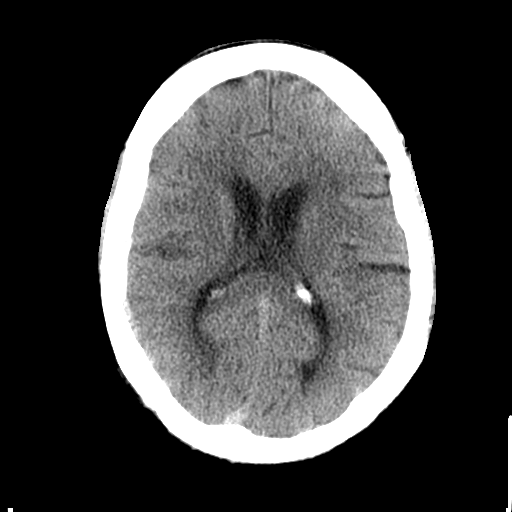
[im 22/36  brain]
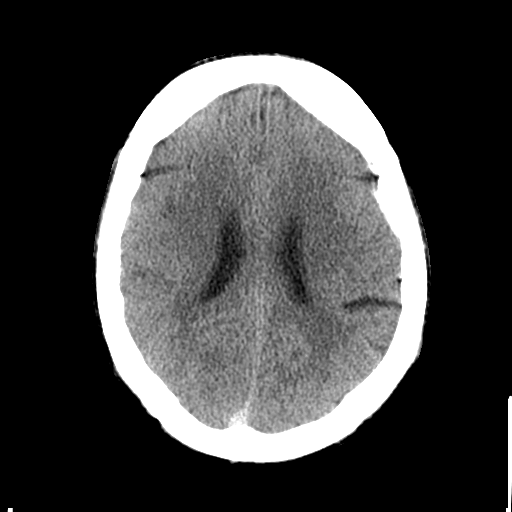
[im 27/36  brain]
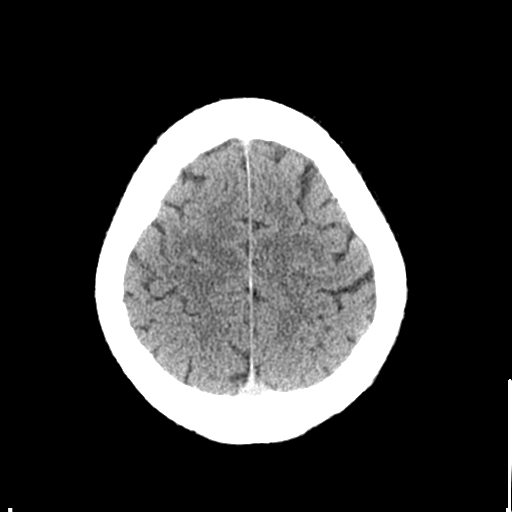
[im 29/36  brain]
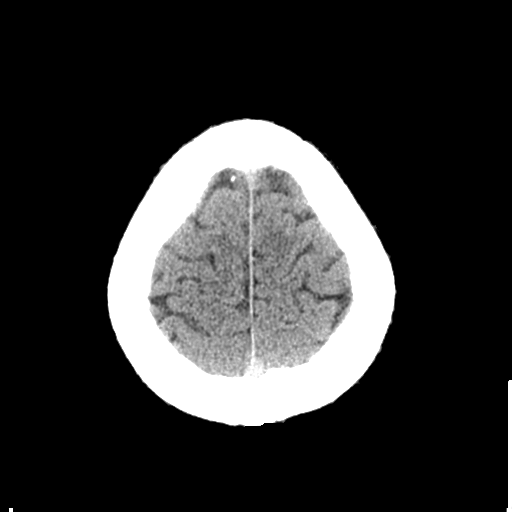
[im 29/36  bone]
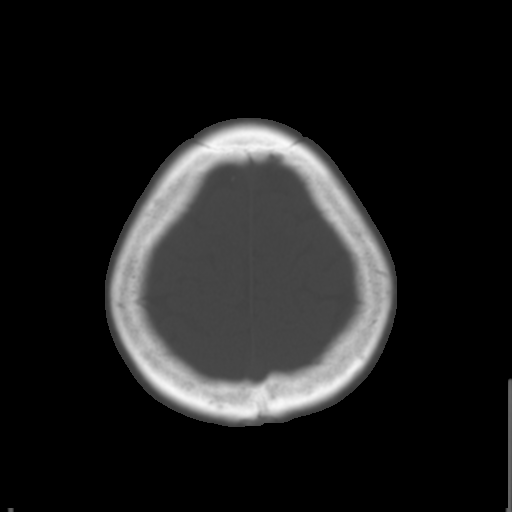
[im 33/36  brain]
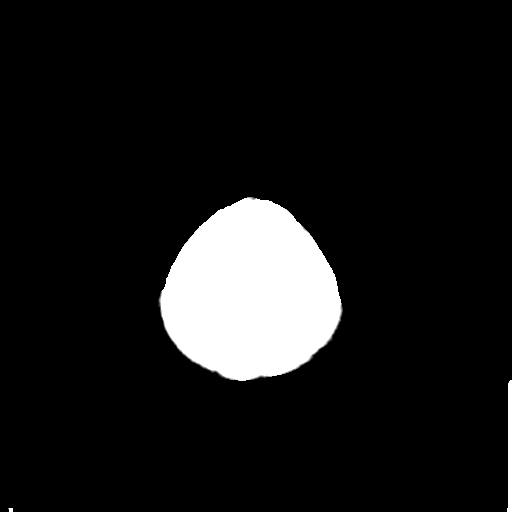

[Series 4: coronal soft · coronal · 0.39mm/px · 3 of 75 slices shown]
[im 19/75  brain]
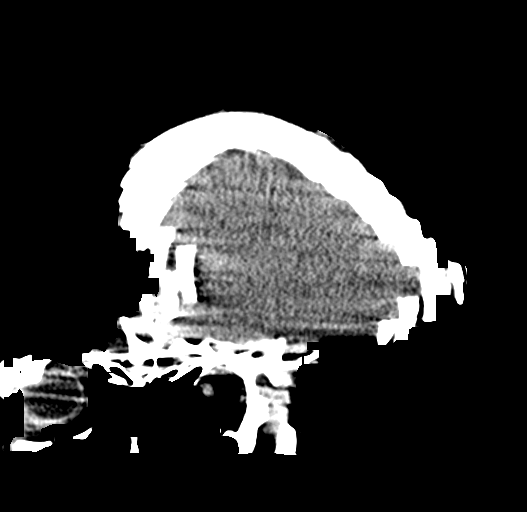
[im 38/75  brain]
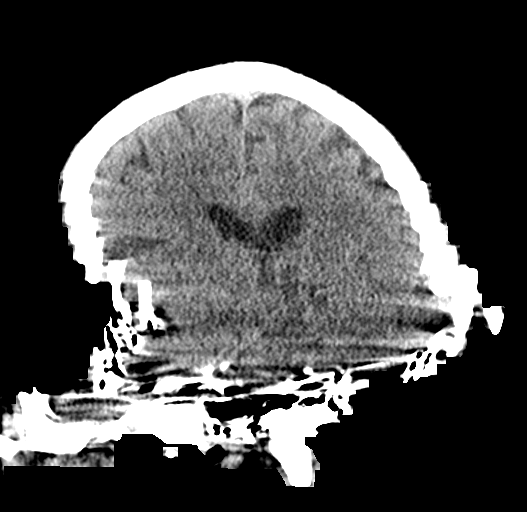
[im 56/75  brain]
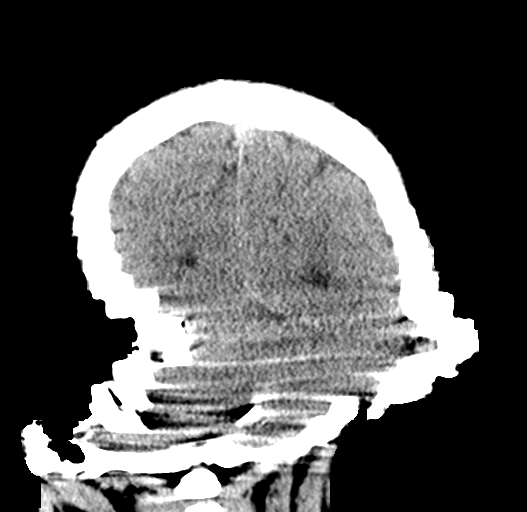

[Series 5: sagittal soft · sagittal · 0.40mm/px · 3 of 67 slices shown]
[im 14/67  brain]
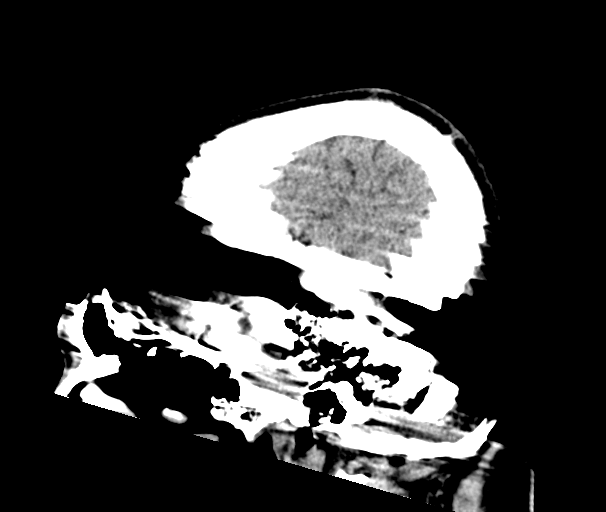
[im 21/67  brain]
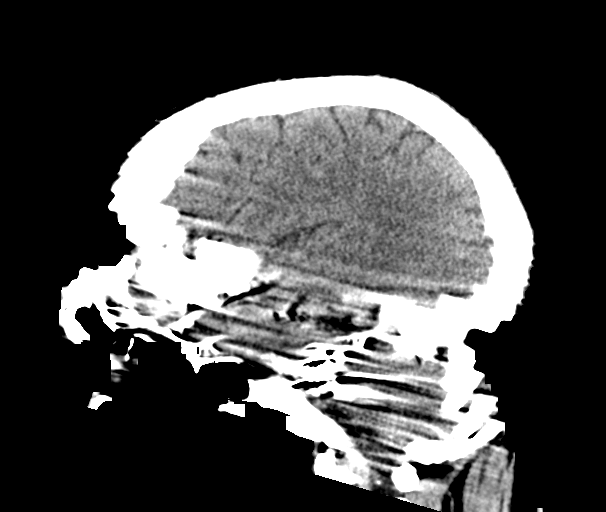
[im 28/67  brain]
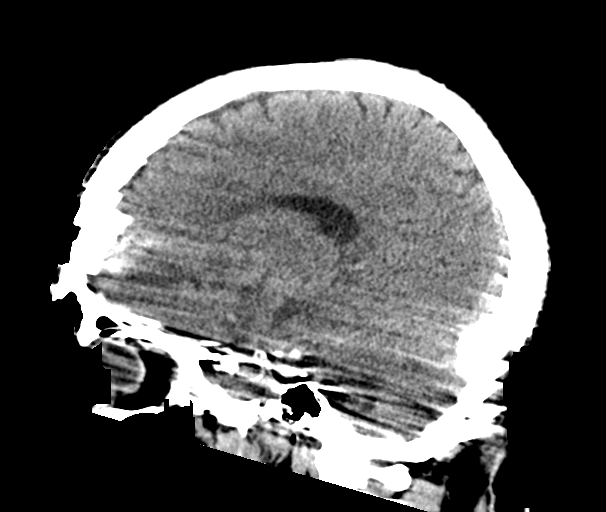

[16 of 47 positions shown; findings below may reference images not displayed]

FINDINGS: Brain: There is no evidence of an acute cortically based infarct,
intracranial hemorrhage, mass, midline shift, or extra-axial fluid
collection. Hypodensities in the cerebral white matter bilaterally
are nonspecific but compatible with mild chronic small vessel
ischemic disease. The ventricles and sulci are within normal limits
for age. There is a 1 cm focus of asymmetric, mild hypoattenuation
in the left cerebellar hemisphere, indeterminate for a small infarct
of uncertain acuity.

Vascular: No suspicious focal vascular hyperdensity.

Skull: No acute fracture or suspicious osseous lesion.

Sinuses/Orbits: Visualized paranasal sinuses and mastoid air cells
are clear. Unremarkable orbits.

Other: None.
IMPRESSION: 1. No evidence of an acute supratentorial infarct or intracranial
hemorrhage.
2. Possible small age indeterminate left cerebellar infarct.
3. Mild chronic small vessel ischemic disease.
